# Patient Record
Sex: Male | Born: 1935 | Race: White | Hispanic: No | Marital: Married | Smoking: Former smoker
Health system: Southern US, Community
[De-identification: ages and names within clinical notes are randomized; demographics above are authoritative.]

## PROBLEM LIST (undated history)

## (undated) DIAGNOSIS — E079 Disorder of thyroid, unspecified: Secondary | ICD-10-CM

## (undated) DIAGNOSIS — G709 Myoneural disorder, unspecified: Secondary | ICD-10-CM

## (undated) DIAGNOSIS — I739 Peripheral vascular disease, unspecified: Secondary | ICD-10-CM

## (undated) DIAGNOSIS — R918 Other nonspecific abnormal finding of lung field: Secondary | ICD-10-CM

## (undated) DIAGNOSIS — R0602 Shortness of breath: Secondary | ICD-10-CM

## (undated) DIAGNOSIS — E785 Hyperlipidemia, unspecified: Secondary | ICD-10-CM

## (undated) DIAGNOSIS — IMO0002 Reserved for concepts with insufficient information to code with codable children: Secondary | ICD-10-CM

## (undated) DIAGNOSIS — I639 Cerebral infarction, unspecified: Secondary | ICD-10-CM

## (undated) DIAGNOSIS — N529 Male erectile dysfunction, unspecified: Secondary | ICD-10-CM

## (undated) DIAGNOSIS — E119 Type 2 diabetes mellitus without complications: Secondary | ICD-10-CM

## (undated) DIAGNOSIS — C189 Malignant neoplasm of colon, unspecified: Secondary | ICD-10-CM

## (undated) DIAGNOSIS — N400 Enlarged prostate without lower urinary tract symptoms: Secondary | ICD-10-CM

## (undated) DIAGNOSIS — F419 Anxiety disorder, unspecified: Secondary | ICD-10-CM

## (undated) DIAGNOSIS — N183 Chronic kidney disease, stage 3 unspecified: Secondary | ICD-10-CM

## (undated) DIAGNOSIS — K219 Gastro-esophageal reflux disease without esophagitis: Secondary | ICD-10-CM

## (undated) DIAGNOSIS — R011 Cardiac murmur, unspecified: Secondary | ICD-10-CM

## (undated) DIAGNOSIS — J189 Pneumonia, unspecified organism: Secondary | ICD-10-CM

## (undated) DIAGNOSIS — I10 Essential (primary) hypertension: Secondary | ICD-10-CM

## (undated) DIAGNOSIS — J449 Chronic obstructive pulmonary disease, unspecified: Secondary | ICD-10-CM

## (undated) DIAGNOSIS — I714 Abdominal aortic aneurysm, without rupture, unspecified: Secondary | ICD-10-CM

## (undated) DIAGNOSIS — C73 Malignant neoplasm of thyroid gland: Secondary | ICD-10-CM

## (undated) DIAGNOSIS — M8580 Other specified disorders of bone density and structure, unspecified site: Secondary | ICD-10-CM

## (undated) HISTORY — DX: Pneumonia, unspecified organism: J18.9

## (undated) HISTORY — DX: Reserved for concepts with insufficient information to code with codable children: IMO0002

## (undated) HISTORY — DX: Peripheral vascular disease, unspecified: I73.9

## (undated) HISTORY — DX: Chronic kidney disease, stage 3 unspecified: N18.30

## (undated) HISTORY — DX: Gastro-esophageal reflux disease without esophagitis: K21.9

## (undated) HISTORY — DX: Cerebral infarction, unspecified: I63.9

## (undated) HISTORY — DX: Abdominal aortic aneurysm, without rupture: I71.4

## (undated) HISTORY — DX: Malignant neoplasm of colon, unspecified: C18.9

## (undated) HISTORY — DX: Disorder of thyroid, unspecified: E07.9

## (undated) HISTORY — DX: Hyperlipidemia, unspecified: E78.5

## (undated) HISTORY — DX: Chronic kidney disease, stage 3 (moderate): N18.3

## (undated) HISTORY — DX: Essential (primary) hypertension: I10

## (undated) HISTORY — DX: Abdominal aortic aneurysm, without rupture, unspecified: I71.40

## (undated) HISTORY — DX: Chronic obstructive pulmonary disease, unspecified: J44.9

## (undated) HISTORY — PX: ABDOMINAL AORTIC ANEURYSM REPAIR: SUR1152

## (undated) HISTORY — DX: Other specified disorders of bone density and structure, unspecified site: M85.80

## (undated) HISTORY — PX: SEPTOPLASTY: SUR1290

## (undated) HISTORY — DX: Benign prostatic hyperplasia without lower urinary tract symptoms: N40.0

## (undated) HISTORY — DX: Male erectile dysfunction, unspecified: N52.9

## (undated) HISTORY — DX: Other nonspecific abnormal finding of lung field: R91.8

## (undated) HISTORY — PX: CATARACT EXTRACTION: SUR2

---

## 1999-02-23 ENCOUNTER — Encounter: Payer: Self-pay | Admitting: Emergency Medicine

## 1999-02-23 ENCOUNTER — Emergency Department (HOSPITAL_COMMUNITY): Admission: EM | Admit: 1999-02-23 | Discharge: 1999-02-23 | Payer: Self-pay | Admitting: Emergency Medicine

## 2002-02-11 ENCOUNTER — Ambulatory Visit (HOSPITAL_COMMUNITY): Admission: RE | Admit: 2002-02-11 | Discharge: 2002-02-11 | Payer: Self-pay | Admitting: Internal Medicine

## 2002-03-02 ENCOUNTER — Ambulatory Visit (HOSPITAL_COMMUNITY): Admission: RE | Admit: 2002-03-02 | Discharge: 2002-03-02 | Payer: Self-pay | Admitting: Internal Medicine

## 2004-12-24 HISTORY — PX: COLON SURGERY: SHX602

## 2005-10-03 ENCOUNTER — Encounter: Payer: Self-pay | Admitting: Cardiovascular Disease

## 2005-10-03 ENCOUNTER — Ambulatory Visit (HOSPITAL_COMMUNITY): Admission: RE | Admit: 2005-10-03 | Discharge: 2005-10-03 | Payer: Self-pay | Admitting: Internal Medicine

## 2005-11-06 ENCOUNTER — Encounter: Admission: RE | Admit: 2005-11-06 | Discharge: 2005-12-03 | Payer: Self-pay | Admitting: Internal Medicine

## 2005-12-04 ENCOUNTER — Encounter: Admission: RE | Admit: 2005-12-04 | Discharge: 2006-03-04 | Payer: Self-pay | Admitting: Internal Medicine

## 2005-12-24 HISTORY — PX: COLON RESECTION: SHX5231

## 2005-12-24 HISTORY — PX: THYROIDECTOMY: SHX17

## 2006-09-30 ENCOUNTER — Ambulatory Visit: Payer: Self-pay | Admitting: Internal Medicine

## 2006-10-21 ENCOUNTER — Encounter (INDEPENDENT_AMBULATORY_CARE_PROVIDER_SITE_OTHER): Payer: Self-pay | Admitting: Specialist

## 2006-10-21 ENCOUNTER — Encounter: Admission: RE | Admit: 2006-10-21 | Discharge: 2006-10-21 | Payer: Self-pay | Admitting: Internal Medicine

## 2006-10-21 ENCOUNTER — Other Ambulatory Visit: Admission: RE | Admit: 2006-10-21 | Discharge: 2006-10-21 | Payer: Self-pay | Admitting: Interventional Radiology

## 2006-10-31 ENCOUNTER — Encounter (INDEPENDENT_AMBULATORY_CARE_PROVIDER_SITE_OTHER): Payer: Self-pay | Admitting: *Deleted

## 2006-10-31 ENCOUNTER — Ambulatory Visit: Payer: Self-pay | Admitting: Internal Medicine

## 2006-11-15 ENCOUNTER — Ambulatory Visit (HOSPITAL_COMMUNITY): Admission: RE | Admit: 2006-11-15 | Discharge: 2006-11-15 | Payer: Self-pay | Admitting: Surgery

## 2006-11-18 ENCOUNTER — Encounter: Payer: Self-pay | Admitting: Cardiovascular Disease

## 2006-11-25 ENCOUNTER — Ambulatory Visit: Payer: Self-pay | Admitting: Pulmonary Disease

## 2006-12-19 ENCOUNTER — Encounter (INDEPENDENT_AMBULATORY_CARE_PROVIDER_SITE_OTHER): Payer: Self-pay | Admitting: *Deleted

## 2006-12-19 ENCOUNTER — Inpatient Hospital Stay (HOSPITAL_COMMUNITY): Admission: RE | Admit: 2006-12-19 | Discharge: 2006-12-25 | Payer: Self-pay | Admitting: Surgery

## 2006-12-25 ENCOUNTER — Ambulatory Visit: Payer: Self-pay | Admitting: Hematology and Oncology

## 2007-01-07 LAB — COMPREHENSIVE METABOLIC PANEL
ALT: 29 U/L (ref 0–53)
AST: 17 U/L (ref 0–37)
CO2: 25 mEq/L (ref 19–32)
Sodium: 143 mEq/L (ref 135–145)
Total Bilirubin: 0.6 mg/dL (ref 0.3–1.2)
Total Protein: 6.7 g/dL (ref 6.0–8.3)

## 2007-01-07 LAB — CBC WITH DIFFERENTIAL/PLATELET
BASO%: 0.5 % (ref 0.0–2.0)
LYMPH%: 28.9 % (ref 14.0–48.0)
MCHC: 34.7 g/dL (ref 32.0–35.9)
MONO#: 0.7 10*3/uL (ref 0.1–0.9)
Platelets: 269 10*3/uL (ref 145–400)
RBC: 4.41 10*6/uL (ref 4.20–5.71)
WBC: 5.7 10*3/uL (ref 4.0–10.0)
lymph#: 1.6 10*3/uL (ref 0.9–3.3)

## 2007-01-07 LAB — CEA: CEA: 1.4 ng/mL (ref 0.0–5.0)

## 2007-01-23 ENCOUNTER — Encounter (INDEPENDENT_AMBULATORY_CARE_PROVIDER_SITE_OTHER): Payer: Self-pay | Admitting: *Deleted

## 2007-01-23 ENCOUNTER — Inpatient Hospital Stay (HOSPITAL_COMMUNITY): Admission: RE | Admit: 2007-01-23 | Discharge: 2007-01-24 | Payer: Self-pay | Admitting: Surgery

## 2007-02-10 ENCOUNTER — Encounter (HOSPITAL_COMMUNITY): Admission: RE | Admit: 2007-02-10 | Discharge: 2007-02-14 | Payer: Self-pay | Admitting: Surgery

## 2007-03-03 ENCOUNTER — Ambulatory Visit (HOSPITAL_COMMUNITY): Admission: RE | Admit: 2007-03-03 | Discharge: 2007-03-03 | Payer: Self-pay | Admitting: Surgery

## 2007-03-14 ENCOUNTER — Encounter (HOSPITAL_COMMUNITY): Admission: RE | Admit: 2007-03-14 | Discharge: 2007-06-12 | Payer: Self-pay | Admitting: Surgery

## 2007-04-03 ENCOUNTER — Ambulatory Visit (HOSPITAL_COMMUNITY): Admission: RE | Admit: 2007-04-03 | Discharge: 2007-04-03 | Payer: Self-pay | Admitting: Hematology and Oncology

## 2007-04-04 ENCOUNTER — Ambulatory Visit: Payer: Self-pay | Admitting: Hematology and Oncology

## 2007-04-09 LAB — CBC WITH DIFFERENTIAL/PLATELET
BASO%: 0.6 % (ref 0.0–2.0)
EOS%: 4.8 % (ref 0.0–7.0)
HCT: 36.9 % — ABNORMAL LOW (ref 38.7–49.9)
LYMPH%: 24.9 % (ref 14.0–48.0)
MCH: 33 pg (ref 28.0–33.4)
MCHC: 35.3 g/dL (ref 32.0–35.9)
MCV: 93.2 fL (ref 81.6–98.0)
MONO%: 12.3 % (ref 0.0–13.0)
NEUT%: 57.4 % (ref 40.0–75.0)
Platelets: 186 10*3/uL (ref 145–400)
lymph#: 1.3 10*3/uL (ref 0.9–3.3)

## 2007-04-09 LAB — COMPREHENSIVE METABOLIC PANEL
ALT: 12 U/L (ref 0–53)
AST: 12 U/L (ref 0–37)
BUN: 16 mg/dL (ref 6–23)
Calcium: 8.9 mg/dL (ref 8.4–10.5)
Creatinine, Ser: 1.26 mg/dL (ref 0.40–1.50)
Total Bilirubin: 0.5 mg/dL (ref 0.3–1.2)

## 2007-04-09 LAB — CEA: CEA: 1.2 ng/mL (ref 0.0–5.0)

## 2007-08-27 ENCOUNTER — Ambulatory Visit: Payer: Self-pay | Admitting: Hematology and Oncology

## 2007-08-29 LAB — CBC WITH DIFFERENTIAL/PLATELET
BASO%: 0.5 % (ref 0.0–2.0)
Basophils Absolute: 0 10*3/uL (ref 0.0–0.1)
HCT: 37.2 % — ABNORMAL LOW (ref 38.7–49.9)
HGB: 13.4 g/dL (ref 13.0–17.1)
LYMPH%: 29.2 % (ref 14.0–48.0)
MCH: 33.1 pg (ref 28.0–33.4)
MCHC: 35.9 g/dL (ref 32.0–35.9)
MONO#: 0.4 10*3/uL (ref 0.1–0.9)
NEUT%: 53.3 % (ref 40.0–75.0)
Platelets: 175 10*3/uL (ref 145–400)
WBC: 4 10*3/uL (ref 4.0–10.0)
lymph#: 1.2 10*3/uL (ref 0.9–3.3)

## 2007-08-29 LAB — COMPREHENSIVE METABOLIC PANEL
BUN: 32 mg/dL — ABNORMAL HIGH (ref 6–23)
CO2: 22 mEq/L (ref 19–32)
Calcium: 8.7 mg/dL (ref 8.4–10.5)
Chloride: 107 mEq/L (ref 96–112)
Creatinine, Ser: 1.67 mg/dL — ABNORMAL HIGH (ref 0.40–1.50)
Total Bilirubin: 0.7 mg/dL (ref 0.3–1.2)

## 2007-08-29 LAB — CEA: CEA: 1.9 ng/mL (ref 0.0–5.0)

## 2007-08-29 LAB — LACTATE DEHYDROGENASE: LDH: 158 U/L (ref 94–250)

## 2007-09-01 ENCOUNTER — Ambulatory Visit (HOSPITAL_COMMUNITY): Admission: RE | Admit: 2007-09-01 | Discharge: 2007-09-01 | Payer: Self-pay | Admitting: Hematology and Oncology

## 2007-10-29 ENCOUNTER — Ambulatory Visit: Payer: Self-pay | Admitting: Internal Medicine

## 2007-11-05 ENCOUNTER — Ambulatory Visit: Payer: Self-pay | Admitting: Internal Medicine

## 2007-11-05 ENCOUNTER — Encounter: Payer: Self-pay | Admitting: Internal Medicine

## 2007-11-06 ENCOUNTER — Encounter (INDEPENDENT_AMBULATORY_CARE_PROVIDER_SITE_OTHER): Payer: Self-pay | Admitting: *Deleted

## 2008-01-02 ENCOUNTER — Ambulatory Visit: Payer: Self-pay | Admitting: Vascular Surgery

## 2008-02-16 ENCOUNTER — Ambulatory Visit: Payer: Self-pay | Admitting: Hematology and Oncology

## 2008-02-18 LAB — COMPREHENSIVE METABOLIC PANEL
Albumin: 4.3 g/dL (ref 3.5–5.2)
BUN: 26 mg/dL — ABNORMAL HIGH (ref 6–23)
CO2: 21 mEq/L (ref 19–32)
Calcium: 9.2 mg/dL (ref 8.4–10.5)
Chloride: 109 mEq/L (ref 96–112)
Glucose, Bld: 91 mg/dL (ref 70–99)
Potassium: 4.5 mEq/L (ref 3.5–5.3)

## 2008-02-18 LAB — CEA: CEA: 0.8 ng/mL (ref 0.0–5.0)

## 2008-02-18 LAB — CBC WITH DIFFERENTIAL/PLATELET
Basophils Absolute: 0 10*3/uL (ref 0.0–0.1)
Eosinophils Absolute: 0.2 10*3/uL (ref 0.0–0.5)
HGB: 13.5 g/dL (ref 13.0–17.1)
MCV: 94.2 fL (ref 81.6–98.0)
NEUT#: 3.2 10*3/uL (ref 1.5–6.5)
RDW: 13.4 % (ref 11.2–14.6)
lymph#: 1.2 10*3/uL (ref 0.9–3.3)

## 2008-02-20 ENCOUNTER — Ambulatory Visit (HOSPITAL_COMMUNITY): Admission: RE | Admit: 2008-02-20 | Discharge: 2008-02-20 | Payer: Self-pay | Admitting: Hematology and Oncology

## 2008-05-26 ENCOUNTER — Ambulatory Visit: Payer: Self-pay | Admitting: Hematology and Oncology

## 2008-05-28 LAB — CBC WITH DIFFERENTIAL/PLATELET
Basophils Absolute: 0 10*3/uL (ref 0.0–0.1)
EOS%: 3.7 % (ref 0.0–7.0)
HCT: 39.9 % (ref 38.7–49.9)
HGB: 13.6 g/dL (ref 13.0–17.1)
LYMPH%: 24.6 % (ref 14.0–48.0)
MCH: 32.5 pg (ref 28.0–33.4)
MCV: 94.8 fL (ref 81.6–98.0)
NEUT%: 59.6 % (ref 40.0–75.0)
Platelets: 176 10*3/uL (ref 145–400)
lymph#: 1.1 10*3/uL (ref 0.9–3.3)

## 2008-05-28 LAB — CEA: CEA: 1.7 ng/mL (ref 0.0–5.0)

## 2008-05-28 LAB — COMPREHENSIVE METABOLIC PANEL
AST: 16 U/L (ref 0–37)
BUN: 27 mg/dL — ABNORMAL HIGH (ref 6–23)
Calcium: 9.5 mg/dL (ref 8.4–10.5)
Chloride: 108 mEq/L (ref 96–112)
Creatinine, Ser: 1.65 mg/dL — ABNORMAL HIGH (ref 0.40–1.50)
Total Bilirubin: 0.7 mg/dL (ref 0.3–1.2)

## 2008-06-01 ENCOUNTER — Encounter: Payer: Self-pay | Admitting: Internal Medicine

## 2008-10-05 ENCOUNTER — Encounter: Payer: Self-pay | Admitting: Cardiovascular Disease

## 2008-11-24 ENCOUNTER — Ambulatory Visit: Payer: Self-pay | Admitting: Hematology and Oncology

## 2008-11-26 LAB — CBC WITH DIFFERENTIAL/PLATELET
BASO%: 0.7 % (ref 0.0–2.0)
EOS%: 3.5 % (ref 0.0–7.0)
LYMPH%: 25.6 % (ref 14.0–48.0)
MCHC: 34.8 g/dL (ref 32.0–35.9)
MCV: 94.7 fL (ref 81.6–98.0)
MONO%: 12.6 % (ref 0.0–13.0)
NEUT#: 2.4 10*3/uL (ref 1.5–6.5)
Platelets: 165 10*3/uL (ref 145–400)
RBC: 3.94 10*6/uL — ABNORMAL LOW (ref 4.20–5.71)
RDW: 13.1 % (ref 11.2–14.6)

## 2008-11-26 LAB — COMPREHENSIVE METABOLIC PANEL
ALT: 26 U/L (ref 0–53)
AST: 19 U/L (ref 0–37)
Albumin: 4 g/dL (ref 3.5–5.2)
Alkaline Phosphatase: 77 U/L (ref 39–117)
Potassium: 4.4 mEq/L (ref 3.5–5.3)
Sodium: 140 mEq/L (ref 135–145)
Total Bilirubin: 0.4 mg/dL (ref 0.3–1.2)
Total Protein: 6.3 g/dL (ref 6.0–8.3)

## 2008-11-26 LAB — CEA: CEA: 1.7 ng/mL (ref 0.0–5.0)

## 2008-11-30 ENCOUNTER — Ambulatory Visit (HOSPITAL_COMMUNITY): Admission: RE | Admit: 2008-11-30 | Discharge: 2008-11-30 | Payer: Self-pay | Admitting: Hematology and Oncology

## 2008-11-30 ENCOUNTER — Encounter (INDEPENDENT_AMBULATORY_CARE_PROVIDER_SITE_OTHER): Payer: Self-pay | Admitting: *Deleted

## 2008-12-03 ENCOUNTER — Encounter: Payer: Self-pay | Admitting: Internal Medicine

## 2009-01-04 DIAGNOSIS — C189 Malignant neoplasm of colon, unspecified: Secondary | ICD-10-CM | POA: Insufficient documentation

## 2009-01-04 DIAGNOSIS — J438 Other emphysema: Secondary | ICD-10-CM | POA: Insufficient documentation

## 2009-01-04 DIAGNOSIS — Z85038 Personal history of other malignant neoplasm of large intestine: Secondary | ICD-10-CM | POA: Insufficient documentation

## 2009-01-04 DIAGNOSIS — Z85850 Personal history of malignant neoplasm of thyroid: Secondary | ICD-10-CM

## 2009-01-04 DIAGNOSIS — I6350 Cerebral infarction due to unspecified occlusion or stenosis of unspecified cerebral artery: Secondary | ICD-10-CM

## 2009-01-04 DIAGNOSIS — J449 Chronic obstructive pulmonary disease, unspecified: Secondary | ICD-10-CM | POA: Insufficient documentation

## 2009-01-05 ENCOUNTER — Ambulatory Visit: Payer: Self-pay | Admitting: Internal Medicine

## 2009-02-02 ENCOUNTER — Ambulatory Visit: Payer: Self-pay | Admitting: Internal Medicine

## 2009-05-25 ENCOUNTER — Ambulatory Visit: Payer: Self-pay | Admitting: Hematology and Oncology

## 2009-05-27 LAB — CBC WITH DIFFERENTIAL/PLATELET
Basophils Absolute: 0 10*3/uL (ref 0.0–0.1)
Eosinophils Absolute: 0.1 10*3/uL (ref 0.0–0.5)
HGB: 13.8 g/dL (ref 13.0–17.1)
MONO%: 15.3 % — ABNORMAL HIGH (ref 0.0–14.0)
NEUT#: 2.5 10*3/uL (ref 1.5–6.5)
RBC: 4.18 10*6/uL — ABNORMAL LOW (ref 4.20–5.82)
RDW: 13.3 % (ref 11.0–14.6)
WBC: 4.6 10*3/uL (ref 4.0–10.3)
lymph#: 1.2 10*3/uL (ref 0.9–3.3)

## 2009-05-27 LAB — COMPREHENSIVE METABOLIC PANEL
AST: 19 U/L (ref 0–37)
Albumin: 4 g/dL (ref 3.5–5.2)
Alkaline Phosphatase: 74 U/L (ref 39–117)
BUN: 35 mg/dL — ABNORMAL HIGH (ref 6–23)
Calcium: 9 mg/dL (ref 8.4–10.5)
Chloride: 108 mEq/L (ref 96–112)
Glucose, Bld: 109 mg/dL — ABNORMAL HIGH (ref 70–99)
Potassium: 4.2 mEq/L (ref 3.5–5.3)
Sodium: 142 mEq/L (ref 135–145)
Total Protein: 6.9 g/dL (ref 6.0–8.3)

## 2009-06-03 ENCOUNTER — Encounter: Payer: Self-pay | Admitting: Internal Medicine

## 2009-12-26 ENCOUNTER — Ambulatory Visit: Payer: Self-pay | Admitting: Hematology and Oncology

## 2009-12-29 LAB — CBC WITH DIFFERENTIAL/PLATELET
BASO%: 0.6 % (ref 0.0–2.0)
EOS%: 3.7 % (ref 0.0–7.0)
HCT: 42.7 % (ref 38.4–49.9)
LYMPH%: 28.7 % (ref 14.0–49.0)
MCH: 33.1 pg (ref 27.2–33.4)
MCHC: 34 g/dL (ref 32.0–36.0)
MCV: 97.3 fL (ref 79.3–98.0)
MONO%: 16 % — ABNORMAL HIGH (ref 0.0–14.0)
NEUT%: 51 % (ref 39.0–75.0)
Platelets: 161 10*3/uL (ref 140–400)
RBC: 4.39 10*6/uL (ref 4.20–5.82)

## 2009-12-29 LAB — COMPREHENSIVE METABOLIC PANEL
ALT: 32 U/L (ref 0–53)
AST: 23 U/L (ref 0–37)
Alkaline Phosphatase: 75 U/L (ref 39–117)
CO2: 22 mEq/L (ref 19–32)
Creatinine, Ser: 1.94 mg/dL — ABNORMAL HIGH (ref 0.40–1.50)
Total Bilirubin: 0.5 mg/dL (ref 0.3–1.2)

## 2009-12-29 LAB — CEA: CEA: 1.6 ng/mL (ref 0.0–5.0)

## 2010-01-02 ENCOUNTER — Ambulatory Visit (HOSPITAL_COMMUNITY): Admission: RE | Admit: 2010-01-02 | Discharge: 2010-01-02 | Payer: Self-pay | Admitting: Hematology and Oncology

## 2010-01-05 ENCOUNTER — Encounter: Payer: Self-pay | Admitting: Internal Medicine

## 2010-01-27 ENCOUNTER — Ambulatory Visit: Payer: Self-pay | Admitting: Vascular Surgery

## 2010-03-07 DIAGNOSIS — C73 Malignant neoplasm of thyroid gland: Secondary | ICD-10-CM | POA: Insufficient documentation

## 2010-03-07 DIAGNOSIS — N529 Male erectile dysfunction, unspecified: Secondary | ICD-10-CM | POA: Insufficient documentation

## 2010-03-07 DIAGNOSIS — E559 Vitamin D deficiency, unspecified: Secondary | ICD-10-CM | POA: Insufficient documentation

## 2010-03-07 DIAGNOSIS — E1169 Type 2 diabetes mellitus with other specified complication: Secondary | ICD-10-CM | POA: Insufficient documentation

## 2010-03-07 DIAGNOSIS — K219 Gastro-esophageal reflux disease without esophagitis: Secondary | ICD-10-CM | POA: Insufficient documentation

## 2010-03-07 DIAGNOSIS — E785 Hyperlipidemia, unspecified: Secondary | ICD-10-CM | POA: Insufficient documentation

## 2010-06-30 ENCOUNTER — Ambulatory Visit: Payer: Self-pay | Admitting: Hematology and Oncology

## 2010-07-04 LAB — CBC WITH DIFFERENTIAL/PLATELET
BASO%: 0.7 % (ref 0.0–2.0)
Basophils Absolute: 0 10*3/uL (ref 0.0–0.1)
Eosinophils Absolute: 0.1 10*3/uL (ref 0.0–0.5)
HCT: 40.8 % (ref 38.4–49.9)
HGB: 14.4 g/dL (ref 13.0–17.1)
LYMPH%: 24.6 % (ref 14.0–49.0)
MCHC: 35.2 g/dL (ref 32.0–36.0)
MONO#: 0.7 10*3/uL (ref 0.1–0.9)
NEUT%: 57.9 % (ref 39.0–75.0)
Platelets: 176 10*3/uL (ref 140–400)
WBC: 4.6 10*3/uL (ref 4.0–10.3)
lymph#: 1.1 10*3/uL (ref 0.9–3.3)

## 2010-07-05 LAB — COMPREHENSIVE METABOLIC PANEL
BUN: 25 mg/dL — ABNORMAL HIGH (ref 6–23)
CO2: 18 mEq/L — ABNORMAL LOW (ref 19–32)
Calcium: 8.9 mg/dL (ref 8.4–10.5)
Chloride: 108 mEq/L (ref 96–112)
Creatinine, Ser: 1.81 mg/dL — ABNORMAL HIGH (ref 0.40–1.50)
Glucose, Bld: 183 mg/dL — ABNORMAL HIGH (ref 70–99)
Total Bilirubin: 0.4 mg/dL (ref 0.3–1.2)

## 2010-07-05 LAB — LACTATE DEHYDROGENASE: LDH: 174 U/L (ref 94–250)

## 2010-07-05 LAB — CEA: CEA: 1.3 ng/mL (ref 0.0–5.0)

## 2010-07-06 ENCOUNTER — Encounter: Payer: Self-pay | Admitting: Internal Medicine

## 2010-08-21 ENCOUNTER — Ambulatory Visit: Payer: Self-pay | Admitting: Vascular Surgery

## 2011-01-14 ENCOUNTER — Encounter: Payer: Self-pay | Admitting: Hematology and Oncology

## 2011-01-15 ENCOUNTER — Ambulatory Visit: Payer: Self-pay | Admitting: Hematology and Oncology

## 2011-01-16 ENCOUNTER — Ambulatory Visit (HOSPITAL_COMMUNITY)
Admission: RE | Admit: 2011-01-16 | Discharge: 2011-01-16 | Payer: Self-pay | Source: Home / Self Care | Attending: Hematology and Oncology | Admitting: Hematology and Oncology

## 2011-01-17 LAB — CBC WITH DIFFERENTIAL/PLATELET
BASO%: 0.5 % (ref 0.0–2.0)
MCHC: 34.7 g/dL (ref 32.0–36.0)
MONO#: 0.7 10*3/uL (ref 0.1–0.9)
RBC: 4.26 10*6/uL (ref 4.20–5.82)
WBC: 4.2 10*3/uL (ref 4.0–10.3)
lymph#: 0.9 10*3/uL (ref 0.9–3.3)

## 2011-01-17 LAB — CEA: CEA: 1.6 ng/mL (ref 0.0–5.0)

## 2011-01-17 LAB — COMPREHENSIVE METABOLIC PANEL
ALT: 35 U/L (ref 0–53)
CO2: 20 mEq/L (ref 19–32)
Calcium: 8.8 mg/dL (ref 8.4–10.5)
Chloride: 105 mEq/L (ref 96–112)
Sodium: 139 mEq/L (ref 135–145)
Total Bilirubin: 0.7 mg/dL (ref 0.3–1.2)
Total Protein: 6.4 g/dL (ref 6.0–8.3)

## 2011-01-23 NOTE — Letter (Signed)
[facility]   Imported By: Lennie Odor 07/17/2010 14:59:12  _____________________________________________________________________  External Attachment:    Type:   Image     Comment:   External Document

## 2011-01-23 NOTE — Letter (Signed)
[facility]   Imported By: Sherian Rein 01/26/2010 07:52:30  _____________________________________________________________________  External Attachment:    Type:   Image     Comment:   External Document

## 2011-01-24 ENCOUNTER — Encounter: Payer: Self-pay | Admitting: Internal Medicine

## 2011-01-24 ENCOUNTER — Ambulatory Visit (HOSPITAL_BASED_OUTPATIENT_CLINIC_OR_DEPARTMENT_OTHER): Payer: MEDICARE | Admitting: Hematology and Oncology

## 2011-01-24 DIAGNOSIS — C187 Malignant neoplasm of sigmoid colon: Secondary | ICD-10-CM

## 2011-01-24 DIAGNOSIS — C73 Malignant neoplasm of thyroid gland: Secondary | ICD-10-CM

## 2011-02-06 ENCOUNTER — Ambulatory Visit: Payer: Self-pay | Admitting: Vascular Surgery

## 2011-02-06 ENCOUNTER — Encounter: Payer: Self-pay | Admitting: Vascular Surgery

## 2011-02-06 ENCOUNTER — Ambulatory Visit (INDEPENDENT_AMBULATORY_CARE_PROVIDER_SITE_OTHER): Payer: MEDICARE | Admitting: Vascular Surgery

## 2011-02-06 DIAGNOSIS — I714 Abdominal aortic aneurysm, without rupture: Secondary | ICD-10-CM

## 2011-02-07 NOTE — Assessment & Plan Note (Signed)
OFFICE VISIT  David [name], David [name] DOB:  03/08/1931                                       02/06/2011 CHART#:[id number]  Patient presents today for continued follow-up of his infrarenal abdominal aortic aneurysm.  He had a CT scan on 01/24, and I am discussing this with him.  This was a follow-up of his colon cancer.  He reports that he has been discharged from cancer followup, now being 5 years free of any evidence of disease after his colon resection.  He does have no change in his CT aneurysm size with a maximal diameter of 5.0 cm.  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  This is no change as well.  Otherwise he is doing quite well with no new medical difficulties.  PHYSICAL EXAMINATION:  Abdomen is soft and nontender.  He does have prominent aortic pulsation with no tenderness over this.  His femoral pulses are 2+.  Blood pressure is 122/69, pulse 75, respirations 20.  I have reviewed his films with patient and his wife.  I have recommended that we continue to see him at 54-month intervals with ultrasound since his cancer CT followups have been discontinued.  We will see him again in 6 months for vascular lab follow-up of his aorta.  I would repeat a CT scan of his chest in several years to follow up on the ectasia of his thoracic aorta as well.    Larina Earthly, M.D.  TFE/MEDQ  D:  02/06/2011  T:  02/07/2011  Job:  [id number]  cc:   Loraine Leriche A. Perini, M.D.

## 2011-02-13 ENCOUNTER — Ambulatory Visit: Payer: Self-pay | Admitting: Vascular Surgery

## 2011-02-20 NOTE — Letter (Signed)
Summary: [facility]  [facility]   Imported By: Sherian Rein 02/12/2011 11:28:12  _____________________________________________________________________  External Attachment:    Type:   Image     Comment:   External Document

## 2011-05-08 NOTE — Procedures (Signed)
DUPLEX ULTRASOUND OF ABDOMINAL AORTA   INDICATION:  Abdominal aortic aneurysm   HISTORY:  Diabetes:  Yes  Cardiac:  No  Hypertension:  Yes  Smoking:  Previous  Connective Tissue Disorder:  Family History:  No  Previous Surgery:  No   DUPLEX EXAM:         AP (cm)                   TRANSVERSE (cm)  Proximal             3.2 cm                    3.2 cm  Mid                  2.8 cm                    2.95 cm  Distal               4.9 cm                    4.7 cm  Right Iliac          1.4 cm                    1.3 cm  Left Iliac           1.2 cm                    1.4 cm   PREVIOUS:  Date: 01/02/2010 (CT)  AP:  4.9  TRANSVERSE:  4.9   IMPRESSION:  Aneurysmal dilatation of the distal abdominal aorta with no  significant change of maximum diameter when compared to the previous  study.   ___________________________________________  Larina Earthly, M.D.   CH/MEDQ  D:  08/22/2010  T:  08/22/2010  Job:  [id number]

## 2011-05-08 NOTE — Assessment & Plan Note (Signed)
OFFICE VISIT   David [name], David [name]  DOB:  1931/02/24                                       01/02/2008  CHART#:[id number]   The patient presents today for evaluation of abdominal aortic aneurysm.  He is here with his wife.  He is a pleasant 77 year old gentleman who  underwent colon cancer resection in December of 2007.  At that time, he  had CAT scan for evaluation and had incidental finding of abdominal  aortic aneurysm, and I am seeing him for further discussion of this.  He  has had several different CT scans now with evidence of slow growth of  his aneurysm.  He has, apparently, no evidence of colon cancer.  He is  also status post resection of thyroid cancer in January of 2008, again  with no evidence of disease currently.   PAST HISTORY:  Significant for hypertension, elevated cholesterol, COPD,  and emphysema.   SOCIAL HISTORY:  He is married with 3 children.  He is retired.  He does  not smoke having quit in November 2007.  He does not drink alcohol on a  regular basis.   REVIEW OF SYSTEMS:  Positive for shortness of breath with exertion,  occasional wheezing.  Does have a history of blood in the stools prior  to his colon surgery.  Does have urinary urgency.  He does have burning  sensation in both feet with walking.  Otherwise, review of systems is  negative.   ALLERGIES:  None.   MEDICATIONS:  Aggrenox 25-200 b.i.d.  Hyzaar 100-25 one daily.  Advair  inhaler 250/50 one puff q. a.m. and q. p.m.  Combivent 200 two puffs q.  a.m. and q. p.m.  Synthroid 150 mcg Tuesday through Sunday.  Synthroid  75 mcg q. Monday.  Vitamin D 50,000 units q. Monday.  Niaspan ER 500 mg  q. p.m.  Crestor 20 mg q. p.m.   PHYSICAL EXAM:  Well-developed, well-nourished white male appearing  stated age of 37.  Blood pressure is 138/76, pulse 70, respirations 18,  saturations 97% on room air.  His carotid arteries have bruits  bilaterally.  He is grossly intact  neurologically.  Radial pulses are 2+  bilaterally.  He has 2+ femoral, 2+ popliteal, and 2+ posterior tibial  pulses with no evidence of peripheral aneurysm.  Abdominal exam reveals  a well-healed lower midline incision and no evidence of masses.  I  reviewed his CAT scan with him.  This does show a maximal diameter of  4.6 cm in September of 2008.  This compared with a maximal diameter of  4.3 cm in April of 2008.  I recommended serial followup and explained  that we would recommend surgery if this reaches the 5-5.5 cm range.  I  explained symptoms of leaking aneurysm to the patient and his wife and  they know to present immediately to the [facility] emergency room should  this occur.  He is to have a CAT scan for followup of his cancer in  April.  I have asked that he have the results of this forwarded to me.  I will see him again in 1 year in September with ultrasound followup of  his ultrasound.  He understands that if he is to have serial CAT scans,  then we would dispense with the ultrasounds and follow him by  his CAT  scan.   Larina Earthly, M.D.  Electronically Signed   TFE/MEDQ  D:  01/02/2008  T:  01/05/2008  Job:  [id number]

## 2011-05-08 NOTE — Assessment & Plan Note (Signed)
OFFICE VISIT   David [name], David [name]  DOB:  12/25/1930                                       01/27/2010  CHART#:[id number]   This patient presents today for continued follow-up of his asymptomatic  infrarenal abdominal aortic aneurysm.  I had seen him most recently in  January 2009.  At that time, he was known to have an infrarenal  abdominal aortic aneurysm found on CT scan when he was having an  evaluation for colon cancer.  He has no symptoms referable to his  aneurysm.   He continues to be stable with diet-controlled diabetes.  He has  controlled hypertension and elevated cholesterol with medications.  He  does have history of COPD and emphysema.   FAMILY HISTORY:  Negative for premature atherosclerotic disease.   SOCIAL HISTORY:  He is married with two children.  He is retired.  He  quit smoking in 2007.  Does not drink alcohol on a regular basis.   REVIEW OF SYSTEMS:  Positive only for pain in his legs with a walking  and a prior history of stroke in 2006.  He has no weight loss or weight  gain.  Review of systems otherwise negative.   PHYSICAL EXAMINATION:  A well-developed, well-nourished white male  appearing stated age of 62.  Blood pressure 108/68, pulse 73,  respirations 18, temperature is 98.6.  General:  He is well-developed in  no acute distress.  HEENT:  Normal.  Lungs:  Clear bilaterally.  Cardiovascular:  Heart has regular rate and rhythm.  He does have 2+  radial, 2+ femoral and 2+ posterior tibial pulses bilaterally.  Abdomen:  Exam reveals no masses, tenderness.  He does have a prominent aortic  pulsation.  Musculoskeletal:  No major deformities or cyanosis.  Neurologic:  No focal weakness or paresthesias.  Skin:  Without ulcers  or rashes.   I do have a recent CT scan and I have reviewed this and discussed at  length with the patient and his wife.  This does show a maximal diameter  of 4.9 cm with a slight increase from his prior  studies.  I again  reviewed symptoms of leaking aneurysm with him and he will notify us  immediately should this occur.  Otherwise we will continue to follow  this on a 65-month interval.  He is having frequent CAT scans and if so,  does not need ultrasound.  If he is going to go a while without a CT, he  will have ultrasound instead.  He is tentatively scheduled for  ultrasound in our office in 6 months.     Larina Earthly, M.D.  Electronically Signed   TFE/MEDQ  D:  01/27/2010  T:  01/30/2010  Job:  [id number]   cc:   Loraine Leriche A. Perini, M.D.

## 2011-05-11 NOTE — Assessment & Plan Note (Signed)
[facility]                             PULMONARY OFFICE NOTE   David [name], David [name]                   MRN:          [id number]  DATE:11/25/2006                            DOB:          1931-08-06    I met David [name] today for evaluation of his breathing difficulties  prior to undergoing surgery for colectomy for colon cancer and  thyroidectomy for thyroid cancer. He apparently had all these diagnosed  recently over the last few months. Specifically with regards to his  breathing, he carries a diagnosis of COPD with emphysema. He is  currently on an inhaler regimen of Advair 250/50 one puff b.i.d. as well  as Combivent 2 puffs b.i.d. He says that he does not have much as far  symptoms of coughing, wheezing, sputum production and he specifically  denies any hemoptysis. He says that he will occasionally get coughing at  night and occasionally get wheezing while he is asleep but otherwise he  does not feel that his breathing causes him any limitations with regards  to his activities. He does have a significant history of tobacco use and  he quit smoking approximately 1 week ago and he is currently using  Chantix to help with his smoking cessation. He says that he has noticed  that since he stopped smoking the occasional cough and occasional  wheezing has gotten better. He has had 2 episodes of pneumonia  previously, his last one being in the fall of 2006. He says that on each  occasion he had received a course of antibiotics at home but did not  require hospital admission and he has never required the use of systemic  steroids to help with his breathing. He says that he had a cardiac  evaluation done recently by Dr. Elease Hashimoto, and had undergone a nuclear  stress test which Mr. Carstens reports as being normal. He had undergone  a CT scan of his chest with intravenous contrast at [facility] on September 18, 2006 and this showed an abdominal  aortic  aneurysm measuring 4 cm in diameter, calcified granulomas in the  mediastinal nodes, subpleural nodule in the left lung base, emphysema  and a 2.7 cm right thyroid nodule. He had undergone a PET scan on  November 15, 2006 which showed a focal area of hypermetabolic activity  within the distant sigmoid colon. He said that he does have some  difficulty with his sleep as well. His current sleep pattern is that he  will try to go to bed around 11 but it can take him anywhere from 1 hour  to 2 hours to fall asleep. He says that once he does fall asleep he is  able to sleep through the night and wakes up between 7 and 8 o'clock in  the morning. His wife says that he does snore at night and has seen him  stop breathing while he is asleep at night. He tends to have worse  problems with his breathing and snoring while he is on his back and he  also tends to breath through his mouth. He will take  a nap at least 3-4  days a week and he falls asleep fairly easily when he is doing things  like watching TV.   PAST MEDICAL HISTORY:  Otherwise significant for hypertension,  COPD/emphysema, abdominal aortic aneurysm, elevated cholesterol, TIA x1,  thyroid cancer, colon cancer. He has had septoplasty.   CURRENT MEDICATIONS:  1.A  Combivent 2 puffs b.i.d. 2.A  Advair 250/50  one puff b.i.d. 3.A  Aggrenox 200/25 b.i.d. 4.A  Omeprazole 20 mg as  needed.5.A  Cozaar 100 mg daily.6.A  Niaspan ER 500 mg q.h.s. 7.A  Crestor 20 mg q.h.s. 8.A  Vitamin D once a week.9.A  Chantix 1 mg b.i.d.  He has no known drug allergies.   FAMILY HISTORY:  Significant for his father who had a history of  tuberculosis, mother had breast, uterine and stomach cancer. He has 3  brothers and 1 sister who had heart disease.   SOCIAL HISTORY:  He is married and lives with his wife. He used to work  in [employer] but is currently retired. He quit smoking 1 year ago. He  started smoking at the age of 67 and used to smoke 2  packs of cigarettes  per day. He denies significant alcohol use.   REVIEW OF SYSTEMS:  He complains of occasional indigestion.   PHYSICAL EXAMINATION:  VITAL SIGNS:A  He is 6 feet tall, 180 pounds.  Temperature is 97.4, blood pressure is 122/72, heart rate is 65, oxygen  saturation 96% on room air.HEENT:A  Pupils reactive, extraocular muscles  intact. There is no sinus tenderness, no nasal discharge. He has a  Mallampati 3 air-way with a decreased AP diameter of the oropharynx.  There is no lymphadenopathy, no thyromegaly. HEART:A  S1, S2, regular  rate and rhythm. CHEST:A  He has decreased breath sounds with a  prolonged expiratory phase but no wheezing or rales.ABDOMEN:A  Thin,  soft, nontender, positive bowel sounds. EXTREMITIES:A  There was no  edema, cyanosis or clubbing.NEUROLOGIC:A  There was no focal deficits  appreciated.   IMPRESSION:  1.A  Chronic obstructive pulmonary disease. I would  continue him on his inhaler regimen of Combivent and Advair. I do not  feel that he would need to undergo further pulmonary function testing at  the present time. 2.A  Tobacco abuse. He is to continue on his regimen  of Chantix to assist with his smoking cessation.3.A  Possible  obstructive sleep apnea. I discussed with him that it would be best to  have him undergo an overnight polysomnogram to further evaluate this. He  was quite reluctant to do this at the present time because he feels that  this would be an additional delay in getting his surgery done.A  Therefore I have made arrangements for him to have a home ApneaLink test  as a screening procedure to see if in fact he does have significant  sleep disordered breathing. If he does then I have recommended that he  undergo an overnight polysomnogram.4.A  Preoperative evaluation for his  thyroid cancer and colon cancer. I have discussed with him that he would  be at increased risk, but certainly not prohibitive risks, for perioperative  pulmonary complications with regards to undergoing these  surgeries. I also emphasized to him that ideally to achieve optimal  benefit from smoking cessation, he would need to have 4-6 weeks of  smoking cessation prior to undergoing surgery, but this would not  preclude him from undergoing surgery earlier.A  Then with regards to the  possibility  of obstructive sleep apnea, I discussed with him that I  again would like to review the results of his ApneaLink screening test.  If this is unrevealing then I think we could forego having him undergo  an overnight polysomnogram at least for the time being although this  certainly should be a consideration at some point particularly given the  fact that he has had two TIAs as well as hypertension. However at the  time of surgery, the anesthesiologist needs to be made  aware that there is a possible concern for obstructive sleep apnea so  that appropriate precautions can be taken during the perioperative  period.     Coralyn Helling, MD  Electronically Signed    VS/MedQ  DD: 11/25/2006  DT: 11/26/2006  Job #: [id number]   cc:   Loraine Leriche A. Perini, M.D.  Currie Paris, M.D.

## 2011-05-11 NOTE — Discharge Summary (Signed)
NAMEXZAVIEN, HARADA            ACCOUNT NO.:  [id number]   MEDICAL RECORD NO.:  [id number]          PATIENT TYPE:  INP   LOCATION:  1618                         FACILITY:  [facility]   PHYSICIAN:  Currie Paris, M.D.DATE OF BIRTH:  [birth year]   DATE OF ADMISSION:  12/19/2006  DATE OF DISCHARGE:                               DISCHARGE SUMMARY   FINAL DIAGNOSES:  1. Carcinoma, sigmoid colon (T3N0M0).  2. Papillary carcinoma of thyroid.  3. Chronic obstructive pulmonary disease.  4. Hypertension.  5. History of anticoagulation therapy.   MEDICAL HISTORY:  Mr. Crowson is a 77 year old gentleman who was having  a CT scan of the chest and was found to have a nodule in his thyroid and  a biopsy showed a papillary carcinoma.  In addition, he had a screening  colonoscopy and a sigmoid colon lesion was found, that on biopsy, showed  high-grade dysplasia, but clinically appeared to be malignant.  After a  lengthy workup including CT scan, pulmonary function tests and  evaluations, etc., it was elected to proceed at this point to a sigmoid  colectomy which would subsequently be followed in four to six weeks with  a thyroidectomy.   HOSPITAL COURSE:  The patient was admitted on 12/27 and underwent  surgery.  A sigmoid colectomy was done.  There was no evidence of  metastatic disease.  The lesion appeared to be malignant grossly.   Postoperatively he had a very benign course.  We kept him in the  Intensive Care Unit for close monitoring of his O2 sats because of COPD  and a questionable history of sleep apnea.  By the fourth postop day, he  was passing a fair amount of gas and we were able to start him on  liquids and advance his diet to solids.  By 11/02 he was ambulating,  having no nausea, tolerating a solid diet and having bowel movements.  He was taking an occasional Tylenol for pain.  He overall felt well.   On exam he was alert and he had been afebrile for several days.  His  abdomen was healing nicely.  He was felt able to be discharged.  At the  time of discharge, I did discuss the pathology report with him which  showed a sigmoid colon carcinoma 2.5 cm in size (T3), but his 12 lymph  nodes were negative and it was planned that he would have a follow-up  oncology consultation.   The patient is discharged to resume his usual home medications.  He will  take occasional Tylenol as needed for pain.  He will follow-up in my  office next week for the remaining suture removal.      Currie Paris, M.D.  Electronically Signed     CJS/MEDQ  D:  12/25/2006  T:  12/25/2006  Job:  [id number]   cc:   Loraine Leriche A. Perini, M.D.  Fax: [phone]   Coralyn Helling, MD  [address]  [place], [place] [address]   Wilhemina Bonito. Marina Goodell, MD  [address]  [place]  [place] [address]

## 2011-05-11 NOTE — Op Note (Signed)
David [name], David [name]            ACCOUNT NO.:  [id number]   MEDICAL RECORD NO.:  [id number]          PATIENT TYPE:  INP   LOCATION:  0001                         FACILITY:  [facility]   PHYSICIAN:  Currie Paris, M.D.DATE OF BIRTH:  August 09, 1931   DATE OF PROCEDURE:  01/23/2007  DATE OF DISCHARGE:                               OPERATIVE REPORT   PREOPERATIVE DIAGNOSIS:  Papillary carcinoma of thyroid right lobe.   POSTOPERATIVE DIAGNOSIS:  Papillary carcinoma of thyroid right lobe.   OPERATION:  Total thyroidectomy.   SURGEON:  Dr. Jamey Ripa.   ASSISTANT:  Dr. Luisa Hart.   ANESTHESIA:  General endotracheal.   CLINICAL HISTORY:  David [name] is a 77 year old gentleman who was  recently found, somewhat incidentally, to have a papillary carcinoma of  right lobe of the thyroid.  He was also found at essentially the same  time to have a carcinoma of the colon.  He has since undergone sigmoid  colectomy and is now admitted for his thyroidectomy.   DESCRIPTION OF PROCEDURE:  The patient was seen in the holding area and  he had no further questions.  We confirmed the thyroidectomy was the  planned procedure.   The patient was taken to the operating room.  After satisfactory general  endotracheal anesthesia had been obtained, the neck was prepped and  draped.  The time-out occurred.   I made a transverse cervical incision and deepened it through the  platysma.  Subplatysmal flaps were raised in the usual fashion.  Self-  retaining retractor was placed.   The midline fascia was opened and the thyroid identified.  Strap muscles  were dissected off of the right lobe of the thyroid and initially we had  this mobilized over and I could clearly see the superior pole so I got  around the superior pole with right-angle, staying very close to the  vessels and ligated those in continuity with 2-0 silks plus clips.  With  that divided, I found another small vessel coming in that was clipped  and divided, leaving two clips on the stay side.   As I rotated the thyroid medially I was trying to stay right on the  thyroid to avoid any injury to the nerve, but in doing so I noticed a  fairly large nodule somewhat more posterolaterally and rotating this up  it was not clear whether this was the actual tumor or a very large  perhaps 3-cm lymph node, but it looked more like thyroid gland.  With  that rotated up, I was then able to get a more usual approach to the  lateral attachments and staying up on the thyroid divided those  primarily with Harmonic scalpel but sometimes with clips and scissors  and cautery.  I identified what appeared to be the recurrent laryngeal  nerve and we checked this also at the end of the case to make sure that  it was intact.  There were some vessels that were clipped in the area,  but was felt that the nerve was find.  I also saw what appeared to be at  least one parathyroid which was  dissected off and kept off of the  specimen and was intact.  Once I got this up and saw the area where the  nerve entered the larynx, I was then able to use a cautery and peel the  thyroid off of the trachea until I got across the midline.   Attention was then turned to the left side which had more normal  anatomy.  Again, we freed up the superior pole and then divided a few of  the inferior pole vessels with the Harmonic, rotated the thyroid  medially and dissected it off of the trachea, staying well up on the  thyroid.  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  Again, the nerve was identified and we stayed  well away from that and up on the thyroid.  Once the thyroid was  completely removed, it was oriented with a suture for Pathology.  The  extra nodule on the right side appeared to be just barely attached to  the thyroid, and again it was not clear whether this was the papillary  tumor or a large lymph node but all of this came  out intact and as one  specimen.   I irrigated and made sure everything was dry.  Once everything appeared  to be dry, we reinspected the areas of the nerves and the parathyroids  and those appeared to be okay.  I left some Surgicel in and then we  closed.   The midline was closed with 3-0 Vicryl as was the platysma.  The skin  was closed with some staples and Steri-Strips.   The patient tolerated the procedure well and there were no operative  complications.  All counts were correct.      Currie Paris, M.D.  Electronically Signed     CJS/MEDQ  D:  01/23/2007  T:  01/23/2007  Job:  [id number]   cc:   Loraine Leriche A. Perini, M.D.  Fax: [phone]

## 2011-05-11 NOTE — Assessment & Plan Note (Signed)
[facility]                             PULMONARY OFFICE NOTE   MYRLE, DUES                   MRN:          [id number]  DATE:12/02/2006                            DOB:          02/14/1931    DISCUSSION:  I have reviewed the apnea link report for Mr. Hornberger,  which was done on November 29, 2006, and this showed overall  apnea/hypopnea index of 14 and respiratory disturbance index of 20 with  an oxygen saturation nadir of 91%.A  I discussed these results with Mr.  Waltermire and I informed him that they are suggestive that he likely has  some degree of sleep disorder breathing.A  What I would recommend at  this time, since he is quite eager to undergo his surgery, is that I do  not feel that we would need to delay his surgery because of these  results.A  Instead, what I would recommend is that appropriate  precautions be taken by anesthesiology during his perioperative period,  specifically with regards to airway management as well as possibly  needing empiric auto CPAP therapy during his postoperative course.A  I  discussed with Mr. Ilic that at some point he would need to have  further evaluation to determine if, in fact, he does have obstructive  sleep apnea and if he does, what the severity of this is but, again, I  do not feel that he would need to have his surgery delayed on the basis  of this.A     Coralyn Helling, MD  Electronically Signed    VS/MedQ  DD: 12/02/2006  DT: 12/03/2006  Job #: [id number]   cc:   Loraine Leriche A. Perini, M.D.  Currie Paris, M.D.

## 2011-05-11 NOTE — Op Note (Signed)
NAMEANTARIO, YASUDA            ACCOUNT NO.:  [id number]   MEDICAL RECORD NO.:  [id number]          PATIENT TYPE:  INP   LOCATION:  X004                         FACILITY:  [facility]   PHYSICIAN:  Currie Paris, M.D.DATE OF BIRTH:  1931-11-01   DATE OF PROCEDURE:  12/19/2006  DATE OF DISCHARGE:                               OPERATIVE REPORT   OFFICE MEDICAL RECORD NUMBER [id number] [phone].   PREOPERATIVE DIAGNOSIS:  Polypoid lesion, sigmoid colon, probably  carcinoma.   POSTOPERATIVE DIAGNOSIS:  Polypoid lesion, sigmoid colon, probably  carcinoma.   OPERATIONS:  Sigmoid colectomy with primary anastomosis.   SURGEON:  Currie Paris, M.D.   ASSESSMENT:  Angelia Mould. Derrell Lolling, M.D.   ANESTHESIA:  General endotracheal.   CLINICAL HISTORY:  Mr. Solivan is a 77 year old gentleman then  incidentally found to have a large polypoid lesion in the sigmoid colon  which clinically appeared malignant, although a biopsy just showed high-  grade dysplasia.  He was also noted to have thyroid carcinoma.  After  preoperative clearance by cardiology and pulmonary (he has significant  COPD), he was scheduled for sigmoid colectomy.  The plan would be to  follow this in a few weeks with a thyroidectomy.   DESCRIPTION OF PROCEDURE:  The patient was seen in the holding area and  he had no further questions.  He confirmed colectomy was the planned  procedure.  The patient was taken to the operating room.  After  satisfactory general endotracheal anesthesia obtained, the abdomen was  clipped.  A Foley catheter was placed and the abdomen prepped and  draped.  A time-out occurred.   I made a midline incision from umbilicus to pubis and entered the  peritoneal cavity under direct vision.  Bimanual exam of the abdomen  showed no gross abnormalities.Theliver was normal.  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  There was no evidence of any  hematoma or evidence that it had been  expanding.   The small bowel was packed out of the pelvis and I could then palpate  the colon lesion and actually see some serosal changes on the anterior  border of the sigmoid.   The sigmoid colon was then mobilized as well as a portion of the  descending colon so we could do a sigmoid resection.  Once I had that  mobilized. I mobilized the pelvic portion of the rectum so that we had  it up nicely and no tension.  Identified the ureters and stayed well  away from those.   I then selected an area well proximal to the tumor for division of the  colon, made a small window in the mesentery, and divided the colon  between bowel clamps.   Using the LigaSure, I then divided the mesentery down to its base.  I  then completed it along distally and then back up to the rectum well  beyond the tumor.  I thought we had a good resection of the mesentery  right down to the base.  I then divided the distal colon using a  Satinsky clamp as a  cutting guide.   We had no tension and I then proceeded to do a single layer 3-0 silk  anastomosis.  The knots were all on the inside for the back row and then  the front row inverted with the last few being some Gambee sutures to  duck the serosa.  This produced a patent anastomosis.  It appeared  intact with a good blood supply.  There was absolutely no tension and it  lay nicely in the pelvis.   I went ahead and applied Tisseel to the entire anastomosis and then  placed it back in the pelvis.  I tacked the mesentery down to the  perineum posteriorly to be sure there were no internal hernia locations.   The small bowel was replaced in the pelvis and the omentum pulled down  over the wound.  Prior to putting the Tisseel on, we irrigated and had  checked for hemostasis.   The abdomen was closed with a running PDS on the peritoneum and a  running #1 Novofil on the fascia.  Subcu was irrigated and closed with  staples.   The patient tolerated the  procedure well.  There no operative  complications.  All counts were correct.  Estimated blood loss was 100  mL.      Currie Paris, M.D.  Electronically Signed     CJS/MEDQ  D:  12/19/2006  T:  12/19/2006  Job:  [id number]   cc:   Loraine Leriche A. Perini, M.D.  Fax: [phone]   Wilhemina Bonito. Marina Goodell, MD  [address]  [place]  [place] [address]

## 2011-05-11 NOTE — Assessment & Plan Note (Signed)
[facility]                           GASTROENTEROLOGY OFFICE NOTE   RANA, ADORNO                   MRN:          [id number]  DATE:09/30/2006                            DOB:          1931-05-03    REFERRING PHYSICIAN:  Loraine Leriche A. Perini, M.D.   REASON FOR CONSULTATION:  Hemoccult positive stool.   HISTORY OF PRESENT ILLNESS:  This is a 77 year old white male with a history  of hypertension, emphysema, hyperlipidemia, ongoing tobacco use and prior  stroke for which he is on Aggrenox.  He is referred through the courtesy of  Dr. Waynard Edwards regarding hemoccult positive stool as identified on outpatient  testing.  As part of his recent comprehensive exam.  Blood work at that time  revealed no evidence of anemia as his hemoglobin was 16.3.  The patient has  a remote history of peptic ulcer disease.  He has chronic dyspeptic  complaints for which he takes Omeprazole periodically.  No dysphagia or  heartburn.  No overwhelming abdominal pain, just what he describes as uneasy  or discomfort.  He occasionally has some urgency to his bowel movements and  rarely sees pink mucous material on the toilet paper after defecating.  He  had CVA x2 in the past with some residual leg weakness on the left.  He has  been on Aggrenox for about one year.   PAST MEDICAL HISTORY:  As above.   PAST SURGICAL HISTORY:  None.   ALLERGIES:  No known drug allergies.   CURRENT MEDICATIONS:  1. Combivent two puffs b.i.d.  2. Advair 250/50 t.i.d.  3. Aggrenox 200/25 mg t.i.d.  4. Omeprazole 20 mg p.r.n.  5. Cozaar 100 mg daily.  6. Niaspan 500 mg at night.  7. Crestor 20 mg at night.   FAMILY HISTORY:  Two sons with colon polyps.   SOCIAL HISTORY:  The patient is married, is accompanied by his wife.  They  have three children.  He is a retired Personnel officer, he smokes, does not use  alcohol.   REVIEW OF SYSTEMS:  Per diagnostic evaluation form.   PHYSICAL  EXAMINATION:  GENERAL:  Elderly male in no acute distress.  He is  alert, oriented.  VITAL SIGNS:  His blood pressure is 144/70, heart rate 60 and regular,  weight 179.4 pounds, height 6 feet.  HEENT:  Sclerae are anicteric.  Conjunctivae are pink.  Oral mucosa intact.  No adenopathy.  LUNGS:  Clear.  HEART:  Regular.  ABDOMEN:  Soft without tenderness, mass or hernia.  Good bowel sounds heard.  RECTAL:  Deferred.  EXTREMITIES:  Without edema.   IMPRESSION:  This is a 77 year old gentleman with history of peptic ulcer  disease, chronic dyspepsia, hemoccult positive stool.  He is not anemic.  Possible causes for his hemoccult positive stool include occult ulcer  disease, neoplasia or AVM's.  Also, more benign causes such as hemorrhoids.   RECOMMENDATIONS:  Colonoscopy and upper endoscopy to exclude mucosal lesions  of the gut to explain occult positive stool, as well as provide colorectal  neoplasia screening and evaluate chronic dyspepsia in the background of  remote ulcer disease.  The nature of the procedures as well as the risks,  benefits and alternatives were discussed in detail.  He understood and  agreed to proceed.  In addition, we discussed the pros and cons of  discontinuing his Aggrenox.  We have elected to keep him on the Aggrenox  during his exam.  Small polyps can be removed on Aggrenox and biopsies  taken.  However, large pathology might require a second procedure off  medication.  Again, he understood and agreed with this strategy.            ______________________________  Wilhemina Bonito. Eda Keys., MD      JNP/MedQ  DD:  09/30/2006  DT:  10/01/2006  Job #:  [id number]   cc:   Loraine Leriche A. Perini, M.D.

## 2011-05-11 NOTE — Discharge Summary (Signed)
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   CLINICAL HISTORY:  [name] is a 77 year old gentleman who has COPD  and on a recent evaluation for that was found to have a thyroid nodule.  A biopsy showed what appeared to be a papillary carcinoma.   HOSPITAL COURSE:  Patient was admitted and taken to the operating room  where a total thyroidectomy was performed.  The patient tolerated the  procedure well.  The evening after surgery, he felt well and his voice  was slightly scratchy.  His calcium was noted to be 9.1.  The next  morning his voice was back to normal.  He felt fine and was felt able to  go home.  His wound weakness clean with no infection.  His calcium was  8.8.  Final pathology report was pending.   CONDITION ON DISCHARGE:  Satisfactory.   To begin on low dose Synthroid.  To follow up in my office in  approximately one week.      Currie Paris, M.D.  Electronically Signed     CJS/MEDQ  D:  02/19/2007  T:  02/19/2007  Job:  [id number]   cc:   Loraine Leriche A. Perini, M.D.  Fax: [phone]

## 2011-08-10 ENCOUNTER — Encounter (INDEPENDENT_AMBULATORY_CARE_PROVIDER_SITE_OTHER): Payer: Medicare Other

## 2011-08-10 ENCOUNTER — Other Ambulatory Visit: Payer: Self-pay | Admitting: Lab

## 2011-08-10 DIAGNOSIS — I714 Abdominal aortic aneurysm, without rupture: Secondary | ICD-10-CM

## 2011-08-17 NOTE — Procedures (Unsigned)
DUPLEX ULTRASOUND OF ABDOMINAL AORTA  INDICATION:  Followup abdominal aortic aneurysm.  HISTORY: Diabetes:  Yes. Cardiac:  No. Hypertension:  Yes. Smoking:  Previous. Connective Tissue Disorder: Family History:  No. Previous Surgery:  No.  DUPLEX EXAM:         AP (cm)                   TRANSVERSE (cm) Proximal             3.42 cm                   3.23 cm Mid                  3.32 cm                   2.73 cm Distal               4.63 cm                   4.99 cm Right Iliac          0.95 cm                   1.12 cm Left Iliac           1.15 cm                   1.21 cm  PREVIOUS:  Date:  08/21/2010  AP:  4.9  TRANSVERSE:  4.7  IMPRESSION: 1. Aneurysmal dilatation of the proximal aorta measuring 3.42 cm x     3.23 cm. 2. Aneurysmal dilatation of the distal aorta measuring 4.63 cm x 4.99     cm with intramural thrombus present. 3. Essentially unchanged since study done on 08/21/2010.  ___________________________________________ Larina Earthly, M.D.  SH/MEDQ  D:  08/10/2011  T:  08/10/2011  Job:  [id number]

## 2011-09-26 ENCOUNTER — Other Ambulatory Visit: Payer: Self-pay | Admitting: Internal Medicine

## 2011-09-26 ENCOUNTER — Ambulatory Visit
Admission: RE | Admit: 2011-09-26 | Discharge: 2011-09-26 | Disposition: A | Discharge status: Home / Self Care | Payer: Medicare Other | Source: Ambulatory Visit | Attending: Internal Medicine | Admitting: Internal Medicine

## 2011-09-26 DIAGNOSIS — M545 Low back pain: Secondary | ICD-10-CM

## 2011-09-28 ENCOUNTER — Other Ambulatory Visit: Payer: Medicare Other

## 2011-10-03 ENCOUNTER — Ambulatory Visit
Admission: RE | Admit: 2011-10-03 | Discharge: 2011-10-03 | Disposition: A | Discharge status: Home / Self Care | Payer: Medicare Other | Source: Ambulatory Visit | Attending: Internal Medicine | Admitting: Internal Medicine

## 2011-10-03 DIAGNOSIS — M545 Low back pain: Secondary | ICD-10-CM

## 2012-01-04 DIAGNOSIS — R062 Wheezing: Secondary | ICD-10-CM | POA: Insufficient documentation

## 2012-01-09 ENCOUNTER — Encounter: Payer: Self-pay | Admitting: Internal Medicine

## 2012-02-18 ENCOUNTER — Encounter (INDEPENDENT_AMBULATORY_CARE_PROVIDER_SITE_OTHER): Payer: Medicare Other | Admitting: *Deleted

## 2012-02-18 DIAGNOSIS — I719 Aortic aneurysm of unspecified site, without rupture: Secondary | ICD-10-CM

## 2012-03-14 ENCOUNTER — Other Ambulatory Visit: Payer: Self-pay | Admitting: *Deleted

## 2012-03-14 ENCOUNTER — Encounter: Payer: Self-pay | Admitting: Vascular Surgery

## 2012-03-14 DIAGNOSIS — I714 Abdominal aortic aneurysm, without rupture: Secondary | ICD-10-CM

## 2012-03-14 NOTE — Procedures (Unsigned)
DUPLEX ULTRASOUND OF ABDOMINAL AORTA  INDICATION:  Followup abdominal aortic aneurysm  HISTORY: Diabetes:  Yes Cardiac:  No Hypertension:  Yes Smoking:  Previous Connective Tissue Disorder: Family History: Previous Surgery:  DUPLEX EXAM:         AP (cm)                   TRANSVERSE (cm) Proximal             2.62 cm                   3.11 cm Mid                  3.00 cm                   2.90 cm Distal               4.68 cm                   4.9 cm Right Iliac          0.78 cm                   0.78 cm Left Iliac           0.88 cm                   0.91 cm  PREVIOUS:  Date:  08/21/2010  AP:  4.9  TRANSVERSE:  4.7  IMPRESSION: 1. Aneurysmal dilatation of the distal aorta measuring 4.68 x 4.9 cm     with intramural thrombus visualized. 2. No significant change since prior study of 08/21/2010.  ___________________________________________ Larina Earthly, M.D.  SS/MEDQ  D:  02/18/2012  T:  02/18/2012  Job:  [id number]

## 2012-03-27 ENCOUNTER — Encounter: Payer: Self-pay | Admitting: *Deleted

## 2012-05-14 ENCOUNTER — Encounter: Payer: Self-pay | Admitting: Internal Medicine

## 2012-05-14 ENCOUNTER — Ambulatory Visit (INDEPENDENT_AMBULATORY_CARE_PROVIDER_SITE_OTHER): Payer: Medicare Other | Admitting: Internal Medicine

## 2012-05-14 VITALS — BP 124/68 | HR 68 | Ht 72.0 in | Wt 194.0 lb

## 2012-05-14 DIAGNOSIS — E119 Type 2 diabetes mellitus without complications: Secondary | ICD-10-CM

## 2012-05-14 DIAGNOSIS — Z860100 Personal history of colon polyps, unspecified: Secondary | ICD-10-CM

## 2012-05-14 DIAGNOSIS — Z85038 Personal history of other malignant neoplasm of large intestine: Secondary | ICD-10-CM

## 2012-05-14 DIAGNOSIS — I639 Cerebral infarction, unspecified: Secondary | ICD-10-CM

## 2012-05-14 DIAGNOSIS — I6350 Cerebral infarction due to unspecified occlusion or stenosis of unspecified cerebral artery: Secondary | ICD-10-CM

## 2012-05-14 MED ORDER — PEG-KCL-NACL-NASULF-NA ASC-C 100 G PO SOLR: 1.0000 | Freq: Once | ORAL | Status: DC

## 2012-05-14 NOTE — Patient Instructions (Signed)
You have been scheduled for a colonoscopy with propofol. Please follow written instructions given to you at your visit today.  Please pick up your prep kit at the pharmacy within the next 1-3 days.  

## 2012-05-14 NOTE — Progress Notes (Signed)
HISTORY OF PRESENT ILLNESS:  David [name] is a 78 y.o. male with multiple significant medical problems as listed below. He has a history of distal sigmoid colon cancer in 2007 as well as a history of adenomatous colon polyps. He presents today regarding surveillance colonoscopy. His last examination was performed 02/02/2009. At that time, he had a normal postoperative colon without polyps. Followup in 3 years recommended. The patient is now on Plavix for history of stroke. Previously on Aggrenox (when we did his last exam). He is also on Januvia for diabetes. Since his last colonoscopy he has enjoyed good health. No hospitalizations or surgeries. His chronic medical problems are stable. He sees Dr. Waynard Edwards regularly. Laboratories from March 2013 have been reviewed and are unremarkable, including a hemoglobin of 15.0. He is accompanied by his wife  REVIEW OF SYSTEMS:  All non-GI ROS negative except for COPD related shortness of breath and back pain  Past Medical History  Diagnosis Date  . COPD (chronic obstructive pulmonary disease)   . Hypertension   . Gastroesophageal reflux disease   . Hyperlipidemia   . Stroke   . AAA (abdominal aortic aneurysm)   . Thyroid disease   . Lung nodules   . Colon cancer   . Diabetes mellitus   . ED (erectile dysfunction)   . BPH (benign prostatic hyperplasia)   . CKD (chronic kidney disease), stage III   . Positional vertigo   . Osteopenia   . Stroke   . AAA (abdominal aortic aneurysm)   . Vitamin d deficiency   . Atherosclerosis   . Peripheral vascular disease     Past Surgical History  Procedure Date  . Colon resection 2007  . Thyroidectomy 2008  . Septoplasty     Social History CASON LUFFMAN  reports that he has quit smoking. He has never used smokeless tobacco. He reports that he does not drink alcohol or use illicit drugs.  family history includes Breast cancer in his mother; Diabetes in his paternal uncle; Heart failure in his  mother; Pneumonia in his father; and Stomach cancer in his mother.  No Known Allergies     PHYSICAL EXAMINATION: Vital signs: BP 124/68  Pulse 68  Wt 194 lb (87.998 kg)  Constitutional: generally well-appearing, no acute distress Psychiatric: alert and oriented x3, cooperative Eyes: extraocular movements intact, anicteric, conjunctiva pink Mouth: oral pharynx moist, no lesions Neck: supple no lymphadenopathy Cardiovascular: heart regular rate and rhythm, no murmur Lungs: clear to auscultation bilaterally Abdomen: soft, nontender, nondistended, no obvious ascites, no peritoneal signs, normal bowel sounds, no organomegaly Rectal:deferred until colonoscopy Extremities: no lower extremity edema bilaterally Skin: no lesions on visible extremities, except for ecchymoses Neuro: No focal deficits.   ASSESSMENT:  #1. Personal history of sigmoid colon cancer 2007 #2. Personal history of adenomatous colon polyps #3. Multiple medical problems including COPD, diabetes, and prior stroke. On Plavix   PLAN:  #1. Surveillance colonoscopy. The patient is high-risk given his comorbidities and medications.The nature of the procedure, as well as the risks, benefits, and alternatives were carefully and thoroughly reviewed with the patient. Ample time for discussion and questions allowed. The patient understood, was satisfied, and agreed to proceed.  #2. CRNA supervised propofol administration for sedation #3. Continue on Plavix for the procedure. We discussed the pros and cons of this strategy #4. Hold diabetic medication the day of the examination. We will monitor his blood sugar before and after the procedure.

## 2012-05-24 DIAGNOSIS — J189 Pneumonia, unspecified organism: Secondary | ICD-10-CM

## 2012-05-24 HISTORY — DX: Pneumonia, unspecified organism: J18.9

## 2012-05-28 ENCOUNTER — Ambulatory Visit (AMBULATORY_SURGERY_CENTER): Payer: Medicare Other | Admitting: Internal Medicine

## 2012-05-28 ENCOUNTER — Encounter: Payer: Self-pay | Admitting: Internal Medicine

## 2012-05-28 VITALS — BP 126/79 | HR 65 | Temp 98.3°F | Resp 17 | Ht 72.0 in | Wt 194.0 lb

## 2012-05-28 DIAGNOSIS — Z1211 Encounter for screening for malignant neoplasm of colon: Secondary | ICD-10-CM

## 2012-05-28 DIAGNOSIS — Z85038 Personal history of other malignant neoplasm of large intestine: Secondary | ICD-10-CM

## 2012-05-28 DIAGNOSIS — Z85850 Personal history of malignant neoplasm of thyroid: Secondary | ICD-10-CM

## 2012-05-28 DIAGNOSIS — Z86010 Personal history of colonic polyps: Secondary | ICD-10-CM

## 2012-05-28 DIAGNOSIS — D126 Benign neoplasm of colon, unspecified: Secondary | ICD-10-CM

## 2012-05-28 MED ORDER — SODIUM CHLORIDE 0.9 % IV SOLN: 500.0000 mL | INTRAVENOUS | Status: DC

## 2012-05-28 NOTE — Progress Notes (Signed)
Patient did not experience any of the following events: a burn prior to discharge; a fall within the facility; wrong site/side/patient/procedure/implant event; or a hospital transfer or hospital admission upon discharge from the facility. (G8907) Patient did not have preoperative order for IV antibiotic SSI prophylaxis. (G8918)  

## 2012-05-28 NOTE — Progress Notes (Signed)
Pressure applied to the abdomen to reach the cecum 

## 2012-05-28 NOTE — Patient Instructions (Signed)
YOU HAD AN ENDOSCOPIC PROCEDURE TODAY AT THE [facility]: Refer to the procedure report that was given to you for any specific questions about what was found during the examination.  If the procedure report does not answer your questions, please call your gastroenterologist to clarify.  If you requested that your care partner not be given the details of your procedure findings, then the procedure report has been included in a sealed envelope for you to review at your convenience later.  YOU SHOULD EXPECT: Some feelings of bloating in the abdomen. Passage of more gas than usual.  Walking can help get rid of the air that was put into your GI tract during the procedure and reduce the bloating. If you had a lower endoscopy (such as a colonoscopy or flexible sigmoidoscopy) you may notice spotting of blood in your stool or on the toilet paper. If you underwent a bowel prep for your procedure, then you may not have a normal bowel movement for a few days.  DIET: Your first meal following the procedure should be a light meal and then it is ok to progress to your normal diet.  A half-sandwich or bowl of soup is an example of a good first meal.  Heavy or fried foods are harder to digest and may make you feel nauseous or bloated.  Likewise meals heavy in dairy and vegetables can cause extra gas to form and this can also increase the bloating.  Drink plenty of fluids but you should avoid alcoholic beverages for 24 hours.  ACTIVITY: Your care partner should take you home directly after the procedure.  You should plan to take it easy, moving slowly for the rest of the day.  You can resume normal activity the day after the procedure however you should NOT DRIVE or use heavy machinery for 24 hours (because of the sedation medicines used during the test).    SYMPTOMS TO REPORT IMMEDIATELY: A gastroenterologist can be reached at any hour.  During normal business hours, 8:30 AM to 5:00 PM Monday through Friday,  call [phone].  After hours and on weekends, please call the GI answering service at [phone] who will take a message and have the physician on call contact you.   Following lower endoscopy (colonoscopy or flexible sigmoidoscopy):  Excessive amounts of blood in the stool  Significant tenderness or worsening of abdominal pains  Swelling of the abdomen that is new, acute  Fever of 100F or higher   FOLLOW UP: If any biopsies were taken you will be contacted by phone or by letter within the next 1-3 weeks.  Call your gastroenterologist if you have not heard about the biopsies in 3 weeks.  Our staff will call the home number listed on your records the next business day following your procedure to check on you and address any questions or concerns that you may have at that time regarding the information given to you following your procedure. This is a courtesy call and so if there is no answer at the home number and we have not heard from you through the emergency physician on call, we will assume that you have returned to your regular daily activities without incident.  SIGNATURES/CONFIDENTIALITY: You and/or your care partner have signed paperwork which will be entered into your electronic medical record.  These signatures attest to the fact that that the information above on your After Visit Summary has been reviewed and is understood.  Full responsibility of the confidentiality of   this discharge information lies with you and/or your care-partner.   INFORMATION ON POLYPS GIVEN TO YOU TODAY 

## 2012-05-28 NOTE — Op Note (Signed)
[facility] [address] [place], [place]  [address]  COLONOSCOPY PROCEDURE REPORT  PATIENT:  David [name], David [name]  MR#:  [id number] BIRTHDATE:  1931-04-19, 80 yrs. old  GENDER:  male ENDOSCOPIST:  Wilhemina Bonito. Eda Keys, MD REF. BY:  Surveillance Program Recall, PROCEDURE DATE:  05/28/2012 PROCEDURE:  Colonoscopy with snare polypectomy x 2 ASA CLASS:  Class III INDICATIONS:  history of colon cancer, history of pre-cancerous (adenomatous) colon polyps, surveillance and high-risk screening ; CRC sigmoid - 2007; f/u 2008, 2020 w/ TAs MEDICATIONS:   MAC sedation, administered by CRNA, propofol (Diprivan) 130 mg IV  DESCRIPTION OF PROCEDURE:   After the risks benefits and alternatives of the procedure were thoroughly explained, informed consent was obtained.  Digital rectal exam was performed and revealed no abnormalities.   The LB CF-[id number] endoscope was introduced through the anus and advanced to the cecum, which was identified by both the appendix and ileocecal valve, without limitations.  The quality of the prep was excellent, using MoviPrep.  The instrument was then slowly withdrawn as the colon was fully examined. <<PROCEDUREIMAGES>>  FINDINGS:  Two 3mm polyps were found in the ascending and transverse colon. Polyps were snared without cautery. Retrieval was successful.  There was evidence of a prior segmental colectomy in the sigmoid colon.  Otherwise normal colonoscopy without other polyps, masses, vascular ectasias, or inflammatory changes. Retroflexed views in the rectum revealed no abnormalities.    The time to cecum = 2:04  minutes. The scope was then withdrawn in 9:51  minutes from the cecum and the procedure completed. COMPLICATIONS:  None  ENDOSCOPIC IMPRESSION: 1) Two polyps in the  colon - removed 2) Prior segmental colectomy in the sigmoid colon 3) Otherwise normal colonoscopy  RECOMMENDATIONS: 1) Follow up colonoscopy in 5  years  ______________________________ Wilhemina Bonito. Eda Keys, MD  CC:  Rodrigo Ran, MD;  The Patient  n. eSIGNED:   Wilhemina Bonito. Eda Keys at 05/28/2012 12:12 PM  Daiva Nakayama, [phone]

## 2012-05-29 ENCOUNTER — Telehealth: Payer: Self-pay | Admitting: *Deleted

## 2012-05-29 NOTE — Telephone Encounter (Signed)
  Follow up Call-  Call back number 05/28/2012  Post procedure Call Back phone  # [phone]  Permission to leave phone message Yes     Patient questions:  Do you have a fever, pain , or abdominal swelling? no Pain Score  0 *  Have you tolerated food without any problems? yes  Have you been able to return to your normal activities? yes  Do you have any questions about your discharge instructions: Diet   no Medications  no Follow up visit  no  Do you have questions or concerns about your Care? no  Actions: * If pain score is 4 or above: No action needed, pain <4.

## 2012-06-02 ENCOUNTER — Encounter: Payer: Self-pay | Admitting: Internal Medicine

## 2012-06-13 ENCOUNTER — Encounter (HOSPITAL_COMMUNITY): Payer: Self-pay | Admitting: Emergency Medicine

## 2012-06-13 ENCOUNTER — Inpatient Hospital Stay (HOSPITAL_COMMUNITY)
Admission: EM | Admit: 2012-06-13 | Discharge: 2012-06-17 | DRG: 193 | Disposition: A | Discharge status: Home Health | Payer: Medicare Other | Attending: Internal Medicine | Admitting: Internal Medicine

## 2012-06-13 ENCOUNTER — Emergency Department (HOSPITAL_COMMUNITY): Payer: Medicare Other

## 2012-06-13 DIAGNOSIS — J438 Other emphysema: Secondary | ICD-10-CM

## 2012-06-13 DIAGNOSIS — E1165 Type 2 diabetes mellitus with hyperglycemia: Secondary | ICD-10-CM

## 2012-06-13 DIAGNOSIS — E119 Type 2 diabetes mellitus without complications: Secondary | ICD-10-CM

## 2012-06-13 DIAGNOSIS — N400 Enlarged prostate without lower urinary tract symptoms: Secondary | ICD-10-CM | POA: Diagnosis present

## 2012-06-13 DIAGNOSIS — R5381 Other malaise: Secondary | ICD-10-CM | POA: Diagnosis present

## 2012-06-13 DIAGNOSIS — I714 Abdominal aortic aneurysm, without rupture, unspecified: Secondary | ICD-10-CM | POA: Diagnosis present

## 2012-06-13 DIAGNOSIS — Z87891 Personal history of nicotine dependence: Secondary | ICD-10-CM

## 2012-06-13 DIAGNOSIS — I509 Heart failure, unspecified: Secondary | ICD-10-CM | POA: Diagnosis present

## 2012-06-13 DIAGNOSIS — D696 Thrombocytopenia, unspecified: Secondary | ICD-10-CM

## 2012-06-13 DIAGNOSIS — J189 Pneumonia, unspecified organism: Principal | ICD-10-CM

## 2012-06-13 DIAGNOSIS — K219 Gastro-esophageal reflux disease without esophagitis: Secondary | ICD-10-CM | POA: Diagnosis present

## 2012-06-13 DIAGNOSIS — R197 Diarrhea, unspecified: Secondary | ICD-10-CM | POA: Diagnosis present

## 2012-06-13 DIAGNOSIS — I5033 Acute on chronic diastolic (congestive) heart failure: Secondary | ICD-10-CM | POA: Diagnosis present

## 2012-06-13 DIAGNOSIS — I10 Essential (primary) hypertension: Secondary | ICD-10-CM

## 2012-06-13 DIAGNOSIS — N184 Chronic kidney disease, stage 4 (severe): Secondary | ICD-10-CM | POA: Diagnosis present

## 2012-06-13 DIAGNOSIS — I6350 Cerebral infarction due to unspecified occlusion or stenosis of unspecified cerebral artery: Secondary | ICD-10-CM | POA: Diagnosis present

## 2012-06-13 DIAGNOSIS — I129 Hypertensive chronic kidney disease with stage 1 through stage 4 chronic kidney disease, or unspecified chronic kidney disease: Secondary | ICD-10-CM | POA: Diagnosis present

## 2012-06-13 DIAGNOSIS — J9600 Acute respiratory failure, unspecified whether with hypoxia or hypercapnia: Secondary | ICD-10-CM | POA: Diagnosis present

## 2012-06-13 DIAGNOSIS — E785 Hyperlipidemia, unspecified: Secondary | ICD-10-CM

## 2012-06-13 DIAGNOSIS — C189 Malignant neoplasm of colon, unspecified: Secondary | ICD-10-CM

## 2012-06-13 DIAGNOSIS — J449 Chronic obstructive pulmonary disease, unspecified: Secondary | ICD-10-CM | POA: Diagnosis present

## 2012-06-13 DIAGNOSIS — Z79899 Other long term (current) drug therapy: Secondary | ICD-10-CM

## 2012-06-13 DIAGNOSIS — R55 Syncope and collapse: Secondary | ICD-10-CM | POA: Diagnosis present

## 2012-06-13 DIAGNOSIS — I739 Peripheral vascular disease, unspecified: Secondary | ICD-10-CM | POA: Diagnosis present

## 2012-06-13 DIAGNOSIS — E039 Hypothyroidism, unspecified: Secondary | ICD-10-CM | POA: Diagnosis present

## 2012-06-13 DIAGNOSIS — Z8673 Personal history of transient ischemic attack (TIA), and cerebral infarction without residual deficits: Secondary | ICD-10-CM

## 2012-06-13 DIAGNOSIS — R0902 Hypoxemia: Secondary | ICD-10-CM

## 2012-06-13 DIAGNOSIS — T363X5A Adverse effect of macrolides, initial encounter: Secondary | ICD-10-CM | POA: Diagnosis present

## 2012-06-13 DIAGNOSIS — M899 Disorder of bone, unspecified: Secondary | ICD-10-CM | POA: Diagnosis present

## 2012-06-13 DIAGNOSIS — Z85850 Personal history of malignant neoplasm of thyroid: Secondary | ICD-10-CM

## 2012-06-13 DIAGNOSIS — Z85038 Personal history of other malignant neoplasm of large intestine: Secondary | ICD-10-CM

## 2012-06-13 HISTORY — DX: Malignant neoplasm of thyroid gland: C73

## 2012-06-13 LAB — BASIC METABOLIC PANEL WITH GFR
BUN: 32 mg/dL — ABNORMAL HIGH (ref 6–23)
CO2: 22 meq/L (ref 19–32)
Calcium: 9 mg/dL (ref 8.4–10.5)
Chloride: 104 meq/L (ref 96–112)
Creatinine, Ser: 1.69 mg/dL — ABNORMAL HIGH (ref 0.50–1.35)
GFR calc Af Amer: 43 mL/min — ABNORMAL LOW
GFR calc non Af Amer: 37 mL/min — ABNORMAL LOW
Glucose, Bld: 176 mg/dL — ABNORMAL HIGH (ref 70–99)
Potassium: 4.1 meq/L (ref 3.5–5.1)
Sodium: 138 meq/L (ref 135–145)

## 2012-06-13 LAB — CBC
HCT: 42.7 % (ref 39.0–52.0)
MCH: 31.2 pg (ref 26.0–34.0)
MCV: 93.8 fL (ref 78.0–100.0)
Platelets: 153 10*3/uL (ref 150–400)
RDW: 14.3 % (ref 11.5–15.5)
WBC: 11.2 10*3/uL — ABNORMAL HIGH (ref 4.0–10.5)

## 2012-06-13 LAB — URINALYSIS, ROUTINE W REFLEX MICROSCOPIC
Bilirubin Urine: NEGATIVE
Hgb urine dipstick: NEGATIVE
Specific Gravity, Urine: 1.021 (ref 1.005–1.030)
pH: 5 (ref 5.0–8.0)

## 2012-06-13 LAB — TROPONIN I: Troponin I: <0.3 ng/mL (ref <0.30)

## 2012-06-13 MED ORDER — ALBUTEROL SULFATE (5 MG/ML) 0.5% IN NEBU
INHALATION_SOLUTION | RESPIRATORY_TRACT | Status: AC
  Administered 2012-06-13: 21:00:00
  Filled 2012-06-13: qty 1

## 2012-06-13 MED ORDER — ACETAMINOPHEN 500 MG PO TABS
1000.0000 mg | ORAL_TABLET | Freq: Once | ORAL | Status: AC
  Administered 2012-06-13: 1000 mg via ORAL
  Filled 2012-06-13: qty 2

## 2012-06-13 MED ORDER — AZITHROMYCIN 500 MG IV SOLR
500.0000 mg | Freq: Once | INTRAVENOUS | Status: DC
  Administered 2012-06-14: 500 mg via INTRAVENOUS
  Filled 2012-06-13: qty 500

## 2012-06-13 MED ORDER — ASPIRIN 325 MG PO TABS: 325.0000 mg | ORAL_TABLET | ORAL | Status: DC

## 2012-06-13 MED ORDER — ONDANSETRON HCL 4 MG/2ML IJ SOLN
INTRAMUSCULAR | Status: AC
  Administered 2012-06-13: 21:00:00
  Filled 2012-06-13: qty 2

## 2012-06-13 MED ORDER — DEXTROSE 5 % IV SOLN
1.0000 g | Freq: Once | INTRAVENOUS | Status: AC
  Administered 2012-06-13: 1 g via INTRAVENOUS
  Filled 2012-06-13: qty 10

## 2012-06-13 MED ORDER — SODIUM CHLORIDE 0.9 % IV BOLUS (SEPSIS)
1000.0000 mL | Freq: Once | INTRAVENOUS | Status: AC
  Administered 2012-06-13: 1000 mL via INTRAVENOUS

## 2012-06-13 MED ORDER — ASPIRIN 81 MG PO CHEW
CHEWABLE_TABLET | ORAL | Status: AC
  Administered 2012-06-13: 324 mg
  Filled 2012-06-13: qty 4

## 2012-06-13 NOTE — ED Notes (Signed)
ZOX:[id number]<AV> Expected date:06/13/12<BR> Expected time: 8:35 PM<BR> Means of arrival:Ambulance<BR> Comments:<BR> Elderly; n/v

## 2012-06-13 NOTE — ED Provider Notes (Signed)
History     CSN: [id number]  Arrival date & time 06/13/12  2049   First MD Initiated Contact with Patient 06/13/12 2116      Chief Complaint  Patient presents with  . Nausea  . Near Syncope     The history is provided by the patient.   the patient reports developing fever and chills this afternoon and generalized weakness.  He reports his been feeling poorly most of the day but his continue to work on his tractor.  He denies diarrhea.  He does report developing acute nausea and vomiting this evening.  He became generally weak with a sitting on the commode and felt too weak to get off the commode.  He received some breathing treatments in route with improvement in his symptoms.  He has non oxygen dependent COPD.  Nothing worsens the symptoms.  Nothing improves his symptoms.  His symptoms he had some chest discomfort and chest pressure which has now since resolved.  Past Medical History  Diagnosis Date  . COPD (chronic obstructive pulmonary disease)   . Hypertension   . Gastroesophageal reflux disease   . Hyperlipidemia   . AAA (abdominal aortic aneurysm)   . Thyroid disease   . Lung nodules   . Diabetes mellitus   . ED (erectile dysfunction)   . BPH (benign prostatic hyperplasia)   . CKD (chronic kidney disease), stage III   . Positional vertigo   . Osteopenia   . AAA (abdominal aortic aneurysm)   . Vitamin d deficiency   . Atherosclerosis   . Peripheral vascular disease   . Colon cancer   . Thyroid cancer   . Stroke   . Stroke     Past Surgical History  Procedure Date  . Colon resection 2007  . Thyroidectomy 2008  . Septoplasty     Family History  Problem Relation Age of Onset  . Stomach cancer Mother   . Heart failure Mother   . Pneumonia Father   . Breast cancer Mother   . Diabetes Paternal Uncle     History  Substance Use Topics  . Smoking status: Former Smoker    Quit date: 05/28/2005  . Smokeless tobacco: Never Used  . Alcohol Use: No       Review of Systems  All other systems reviewed and are negative.    Allergies  Review of patient's allergies indicates no known allergies.  Home Medications   Current Outpatient Rx  Name Route Sig Dispense Refill  . ACETAMINOPHEN 500 MG PO TABS Oral Take 500 mg by mouth every 6 (six) hours as needed. For pain    . CALCIUM CARBONATE 1250 MG PO CHEW Oral Chew 1 tablet by mouth daily.    Marland Kitchen CLOPIDOGREL BISULFATE 75 MG PO TABS Oral Take 75 mg by mouth daily.    Marland Kitchen FLUTICASONE-SALMETEROL 250-50 MCG/DOSE IN AEPB Inhalation Inhale 1 puff into the lungs every 12 (twelve) hours.    Effie Berkshire IN Inhalation Inhale 2 puffs into the lungs 3 (three) times daily.     Marland Kitchen LEVOTHYROXINE SODIUM 125 MCG PO TABS Oral Take 125 mcg by mouth daily.    Marland Kitchen LOSARTAN POTASSIUM-HCTZ 100-25 MG PO TABS Oral Take 1 tablet by mouth daily.    Marland Kitchen MIRTAZAPINE 15 MG PO TABS Oral Take 15 mg by mouth at bedtime.    Marland Kitchen NIACIN ER (ANTIHYPERLIPIDEMIC) 500 MG PO TBCR Oral Take 500 mg by mouth at bedtime.    . OMEPRAZOLE 20 MG PO CPDR  Oral Take 20 mg by mouth daily.    Marland Kitchen ROSUVASTATIN CALCIUM 20 MG PO TABS Oral Take 20 mg by mouth daily.    Marland Kitchen SITAGLIPTIN PHOSPHATE 100 MG PO TABS Oral Take 50 mg by mouth daily.     Marland Kitchen VITAMIN D (ERGOCALCIFEROL) 50000 UNITS PO CAPS Oral Take 50,000 Units by mouth every 7 (seven) days.      BP 105/52  Pulse 98  Temp 101.2 F (38.4 C) (Rectal)  Resp 27  Ht 6' (1.829 m)  Wt 193 lb (87.544 kg)  BMI 26.18 kg/m2  SpO2 90%  Physical Exam  Nursing note and vitals reviewed. Constitutional: He is oriented to person, place, and time. He appears well-developed and well-nourished.  HENT:  Head: Normocephalic and atraumatic.  Eyes: EOM are normal.  Neck: Normal range of motion.  Cardiovascular: Normal rate, regular rhythm, normal heart sounds and intact distal pulses.   Pulmonary/Chest: Effort normal and breath sounds normal. No respiratory distress.  Abdominal: Soft. He exhibits no  distension. There is no tenderness.  Musculoskeletal: Normal range of motion.  Neurological: He is alert and oriented to person, place, and time.  Skin: Skin is warm and dry.  Psychiatric: He has a normal mood and affect. Judgment normal.    ED Course  Procedures (including critical care time)   Date: 06/13/2012  Rate: 105  Rhythm: sinus tachycardia  QRS Axis: normal  Intervals: normal  ST/T Wave abnormalities: normal  Conduction Disutrbances: none  Narrative Interpretation:   Old EKG Reviewed: No significant changes noted      Labs Reviewed  CBC - Abnormal; Notable for the following:    WBC 11.2 (*)     All other components within normal limits  BASIC METABOLIC PANEL - Abnormal; Notable for the following:    Glucose, Bld 176 (*)     BUN 32 (*)     Creatinine, Ser 1.69 (*)     GFR calc non Af Amer 37 (*)     GFR calc Af Amer 43 (*)     All other components within normal limits  URINALYSIS, ROUTINE W REFLEX MICROSCOPIC - Abnormal; Notable for the following:    Ketones, ur TRACE (*)     All other components within normal limits  TROPONIN I  LACTIC ACID, PLASMA  CULTURE, BLOOD (ROUTINE X 2)  CULTURE, BLOOD (ROUTINE X 2)  URINE CULTURE   Dg Chest 2 View  06/13/2012  *RADIOLOGY REPORT*  Clinical Data: Chest tightness and shortness of breath; cough and fever.  Weakness.  History of diabetes.  CHEST - 2 VIEW  Comparison: CT of the chest performed 01/16/2011  Findings: The lungs are well-aerated.  Mild right basilar airspace opacity raises concern for pneumonia.  Scattered small bilateral pulmonary nodules are better characterized on the prior CT.  No definite pleural effusion or pneumothorax is seen.  The heart is normal in size; the mediastinal contour is within normal limits.  No acute osseous abnormalities are seen.  IMPRESSION:  1.  Mild right basilar airspace opacity raises concern for pneumonia. 2.  Scattered small bilateral pulmonary nodules again noted, better  characterized on the prior CT.  Original Report Authenticated By: Tonia Ghent, M.D.    I personally reviewed the imaging tests through PACS system  I reviewed available ER/hospitalization records thought the EMR   No diagnosis found. 1. community-acquired pneumonia   MDM  Patient presents with an oral temperature of 101.6.  He has evidence of right lower lobe pneumonia with  new oxygen requirement given his presenting her to was 86% on room air.  He is now 94% on 2 L.  Does better after breathing treatments and oxygen        Lyanne Co, MD 06/13/12 [id number]

## 2012-06-13 NOTE — ED Notes (Signed)
Per EMS: Pt with weakness, nausea and vomiting x6, and near syncopal episode without fall. Pt has been "working in the yard for three days, and maybe not hydrating well." Pt with emphysema hx, received 5mg  of albuterol nebulized, and 4mg  of zofran IV. Has PIV in left A/C. Not O2 dependent at home. NSR on monitor and EMS EKG. Denies chest pain, reports some chest pressure

## 2012-06-13 NOTE — ED Notes (Signed)
MD at bedside. 

## 2012-06-13 NOTE — H&P (Signed)
David [name] is an 78 y.o. male.    Pcp: Dr. Waynard Edwards  Chief Complaint: weakness and sob HPI: 78 yo male with hx of copd, c/o fever, chills, sob, cough (dry), wheezing, slight chest discomfort  (resolved), without radiation, associated with cough.  Slight n/v of food, mostly dry heaves though.  Pt denies palp, diarrhea, brbpr, black stool.    Pt was on the comode in bathroom, and was unable to get off comode due to weakness, and sob.  In Ed. Pt found to have pneumonia  Pox 88%.  Pt will be admitted for cap.   Past Medical History  Diagnosis Date  . COPD (chronic obstructive pulmonary disease)   . Hypertension   . Gastroesophageal reflux disease   . Hyperlipidemia   . AAA (abdominal aortic aneurysm)   . Thyroid disease   . Lung nodules   . Diabetes mellitus   . ED (erectile dysfunction)   . BPH (benign prostatic hyperplasia)   . CKD (chronic kidney disease), stage III   . Positional vertigo   . Osteopenia   . AAA (abdominal aortic aneurysm)   . Vitamin d deficiency   . Atherosclerosis     pt denies this  . Peripheral vascular disease   . Colon cancer   . Thyroid cancer   . Stroke   . Stroke     Past Surgical History  Procedure Date  . Colon resection 2007  . Thyroidectomy 2008  . Septoplasty     Family History  Problem Relation Age of Onset  . Stomach cancer Mother   . Heart failure Mother   . Pneumonia Father   . Breast cancer Mother   . Diabetes Paternal Uncle    Social History:  reports that he quit smoking about 7 years ago. His smoking use included Cigarettes. He has a 75 pack-year smoking history. He has never used smokeless tobacco. He reports that he does not drink alcohol or use illicit drugs.  Allergies: No Known Allergies   (Not in a hospital admission)  Results for orders placed during the hospital encounter of 06/13/12 (from the past 48 hour(s))  CBC     Status: Abnormal   Collection Time   06/13/12  9:20 PM      Component Value Range  Comment   WBC 11.2 (*) 4.0 - 10.5 K/uL    RBC 4.55  4.22 - 5.81 MIL/uL    Hemoglobin 14.2  13.0 - 17.0 g/dL    HCT 81.1  91.4 - 78.2 %    MCV 93.8  78.0 - 100.0 fL    MCH 31.2  26.0 - 34.0 pg    MCHC 33.3  30.0 - 36.0 g/dL    RDW 95.6  21.3 - 08.6 %    Platelets 153  150 - 400 K/uL   BASIC METABOLIC PANEL     Status: Abnormal   Collection Time   06/13/12  9:20 PM      Component Value Range Comment   Sodium 138  135 - 145 mEq/L    Potassium 4.1  3.5 - 5.1 mEq/L    Chloride 104  96 - 112 mEq/L    CO2 22  19 - 32 mEq/L    Glucose, Bld 176 (*) 70 - 99 mg/dL    BUN 32 (*) 6 - 23 mg/dL    Creatinine, Ser 5.78 (*) 0.50 - 1.35 mg/dL    Calcium 9.0  8.4 - 46.9 mg/dL    GFR calc non  Af Amer 37 (*) >90 mL/min    GFR calc Af Amer 43 (*) >90 mL/min   TROPONIN I     Status: Normal   Collection Time   06/13/12  9:20 PM      Component Value Range Comment   Troponin I <0.30  <0.30 ng/mL   URINALYSIS, ROUTINE W REFLEX MICROSCOPIC     Status: Abnormal   Collection Time   06/13/12 10:00 PM      Component Value Range Comment   Color, Urine YELLOW  YELLOW    APPearance CLEAR  CLEAR    Specific Gravity, Urine 1.021  1.005 - 1.030    pH 5.0  5.0 - 8.0    Glucose, UA NEGATIVE  NEGATIVE mg/dL    Hgb urine dipstick NEGATIVE  NEGATIVE    Bilirubin Urine NEGATIVE  NEGATIVE    Ketones, ur TRACE (*) NEGATIVE mg/dL    Protein, ur NEGATIVE  NEGATIVE mg/dL    Urobilinogen, UA 0.2  0.0 - 1.0 mg/dL    Nitrite NEGATIVE  NEGATIVE    Leukocytes, UA NEGATIVE  NEGATIVE MICROSCOPIC NOT DONE ON URINES WITH NEGATIVE PROTEIN, BLOOD, LEUKOCYTES, NITRITE, OR GLUCOSE <1000 mg/dL.  LACTIC ACID, PLASMA     Status: Normal   Collection Time   06/13/12 10:06 PM      Component Value Range Comment   Lactic Acid, Venous 2.0  0.5 - 2.2 mmol/L    Dg Chest 2 View  06/13/2012  *RADIOLOGY REPORT*  Clinical Data: Chest tightness and shortness of breath; cough and fever.  Weakness.  History of diabetes.  CHEST - 2 VIEW   Comparison: CT of the chest performed 01/16/2011  Findings: The lungs are well-aerated.  Mild right basilar airspace opacity raises concern for pneumonia.  Scattered small bilateral pulmonary nodules are better characterized on the prior CT.  No definite pleural effusion or pneumothorax is seen.  The heart is normal in size; the mediastinal contour is within normal limits.  No acute osseous abnormalities are seen.  IMPRESSION:  1.  Mild right basilar airspace opacity raises concern for pneumonia. 2.  Scattered small bilateral pulmonary nodules again noted, better characterized on the prior CT.  Original Report Authenticated By: Tonia Ghent, M.D.    Review of Systems  Constitutional: Positive for fever and chills. Negative for weight loss, malaise/fatigue and diaphoresis.  HENT: Negative for hearing loss, ear pain, nosebleeds, congestion, sore throat, neck pain, tinnitus and ear discharge.   Eyes: Negative for blurred vision, double vision, photophobia, pain, discharge and redness.  Respiratory: Positive for cough, shortness of breath and wheezing. Negative for hemoptysis, sputum production and stridor.   Cardiovascular: Positive for chest pain. Negative for palpitations, orthopnea, claudication, leg swelling and PND.  Gastrointestinal: Positive for nausea and vomiting. Negative for heartburn, abdominal pain, diarrhea, constipation, blood in stool and melena.  Genitourinary: Negative for dysuria, urgency, frequency, hematuria and flank pain.  Musculoskeletal: Positive for joint pain. Negative for myalgias, back pain and falls.  Skin: Negative for itching and rash.  Neurological: Negative for dizziness, tingling, tremors, sensory change, speech change, focal weakness, seizures, loss of consciousness, weakness and headaches.  Endo/Heme/Allergies: Negative for environmental allergies and polydipsia. Does not bruise/bleed easily.  Psychiatric/Behavioral: Negative for depression, suicidal ideas,  hallucinations, memory loss and substance abuse. The patient is not nervous/anxious and does not have insomnia.     Blood pressure 101/48, pulse 94, temperature 101.2 F (38.4 C), temperature source Rectal, resp. rate 20, height 6' (1.829 m), weight 87.544  kg (193 lb), SpO2 92.00%. Physical Exam  Constitutional: He is oriented to person, place, and time. He appears well-developed and well-nourished. No distress.  HENT:  Head: Normocephalic and atraumatic.  Nose: Nose normal.  Mouth/Throat: Oropharynx is clear and moist. No oropharyngeal exudate.  Eyes: Conjunctivae and EOM are normal. Pupils are equal, round, and reactive to light. Right eye exhibits no discharge. Left eye exhibits no discharge. No scleral icterus.  Neck: Normal range of motion. Neck supple. No JVD present. No tracheal deviation present. No thyromegaly present.  Cardiovascular: Normal rate, regular rhythm and normal heart sounds.  Exam reveals no gallop and no friction rub.   No murmur heard. Respiratory: Effort normal. No stridor. No respiratory distress. He has no wheezes. He has rales. He exhibits no tenderness.  GI: Soft. Bowel sounds are normal. He exhibits no distension and no mass. There is no tenderness. There is no rebound and no guarding.  Musculoskeletal: Normal range of motion. He exhibits no edema and no tenderness.  Lymphadenopathy:    He has no cervical adenopathy.  Neurological: He is alert and oriented to person, place, and time. He has normal reflexes. He displays normal reflexes. No cranial nerve deficit. He exhibits normal muscle tone. Coordination normal.  Skin: Skin is warm and dry. No rash noted. He is not diaphoretic. No erythema. No pallor.  Psychiatric: He has a normal mood and affect. His behavior is normal. Judgment and thought content normal.     Assessment/Plan CAP:  tx with rocephin, zithromax Blood cx x2 sets pending  Copd (not on home o2) Albuterol, atrovent Cont Advair for now  Dm2:   Cont januvia,  fsbs ac and qhs and iss  Hypertension: stable,cont hyzaar  Hypothyroidism:  Cont synthroid  Pearson Grippe 06/13/2012, 11:33 PM

## 2012-06-14 ENCOUNTER — Encounter (HOSPITAL_COMMUNITY): Payer: Self-pay

## 2012-06-14 ENCOUNTER — Inpatient Hospital Stay (HOSPITAL_COMMUNITY): Payer: Medicare Other

## 2012-06-14 DIAGNOSIS — E785 Hyperlipidemia, unspecified: Secondary | ICD-10-CM | POA: Diagnosis present

## 2012-06-14 DIAGNOSIS — J438 Other emphysema: Secondary | ICD-10-CM

## 2012-06-14 DIAGNOSIS — I10 Essential (primary) hypertension: Secondary | ICD-10-CM

## 2012-06-14 DIAGNOSIS — D696 Thrombocytopenia, unspecified: Secondary | ICD-10-CM

## 2012-06-14 DIAGNOSIS — I359 Nonrheumatic aortic valve disorder, unspecified: Secondary | ICD-10-CM

## 2012-06-14 DIAGNOSIS — E119 Type 2 diabetes mellitus without complications: Secondary | ICD-10-CM | POA: Diagnosis present

## 2012-06-14 LAB — MRSA PCR SCREENING: MRSA by PCR: NEGATIVE

## 2012-06-14 LAB — CARDIAC PANEL(CRET KIN+CKTOT+MB+TROPI)
CK, MB: 4.2 ng/mL — ABNORMAL HIGH (ref 0.3–4.0)
Relative Index: 2.3 (ref 0.0–2.5)
Total CK: 120 U/L (ref 7–232)
Total CK: 182 U/L (ref 7–232)
Total CK: 181 U/L (ref 7–232)
Troponin I: <0.3 ng/mL (ref <0.30)

## 2012-06-14 LAB — LIPID PANEL
LDL Cholesterol: 45 mg/dL (ref 0–99)
Total CHOL/HDL Ratio: 2.7 RATIO
VLDL: 13 mg/dL (ref 0–40)

## 2012-06-14 LAB — COMPREHENSIVE METABOLIC PANEL
Alkaline Phosphatase: 63 U/L (ref 39–117)
BUN: 31 mg/dL — ABNORMAL HIGH (ref 6–23)
CO2: 22 mEq/L (ref 19–32)
Calcium: 8.3 mg/dL — ABNORMAL LOW (ref 8.4–10.5)
GFR calc Af Amer: 42 mL/min — ABNORMAL LOW (ref >90)
GFR calc non Af Amer: 36 mL/min — ABNORMAL LOW (ref >90)
Glucose, Bld: 133 mg/dL — ABNORMAL HIGH (ref 70–99)
Potassium: 4.1 mEq/L (ref 3.5–5.1)
Total Protein: 6.6 g/dL (ref 6.0–8.3)

## 2012-06-14 LAB — EXPECTORATED SPUTUM ASSESSMENT W GRAM STAIN, RFLX TO RESP C

## 2012-06-14 LAB — GLUCOSE, CAPILLARY: Glucose-Capillary: 128 mg/dL — ABNORMAL HIGH (ref 70–99)

## 2012-06-14 LAB — URINE CULTURE
Colony Count: 1000
Culture  Setup Time: 201306220200

## 2012-06-14 LAB — HEMOGLOBIN A1C
Hgb A1c MFr Bld: 6.6 % — ABNORMAL HIGH (ref <5.7)
Mean Plasma Glucose: 143 mg/dL — ABNORMAL HIGH (ref <117)

## 2012-06-14 MED ORDER — CLOPIDOGREL BISULFATE 75 MG PO TABS
75.0000 mg | ORAL_TABLET | Freq: Every day | ORAL | Status: DC
  Administered 2012-06-14 – 2012-06-15 (×2): 75 mg via ORAL
  Filled 2012-06-14 (×3): qty 1

## 2012-06-14 MED ORDER — HEPARIN SODIUM (PORCINE) 5000 UNIT/ML IJ SOLN
5000.0000 [IU] | Freq: Three times a day (TID) | INTRAMUSCULAR | Status: DC
  Administered 2012-06-14 – 2012-06-15 (×3): 5000 [IU] via SUBCUTANEOUS
  Filled 2012-06-14 (×6): qty 1

## 2012-06-14 MED ORDER — PANTOPRAZOLE SODIUM 40 MG PO TBEC
40.0000 mg | DELAYED_RELEASE_TABLET | Freq: Every day | ORAL | Status: DC
  Administered 2012-06-14 – 2012-06-17 (×4): 40 mg via ORAL
  Filled 2012-06-14 (×4): qty 1

## 2012-06-14 MED ORDER — HYDROCHLOROTHIAZIDE 25 MG PO TABS
25.0000 mg | ORAL_TABLET | Freq: Every day | ORAL | Status: DC
Start: 2012-06-14 — End: 2012-06-14
  Filled 2012-06-14: qty 1

## 2012-06-14 MED ORDER — DEXTROSE 5 % IV SOLN: 1.0000 g | Freq: Once | INTRAVENOUS | Status: DC

## 2012-06-14 MED ORDER — ACETAMINOPHEN 500 MG PO TABS
500.0000 mg | ORAL_TABLET | Freq: Four times a day (QID) | ORAL | Status: DC | PRN
  Administered 2012-06-15 (×2): 500 mg via ORAL
  Filled 2012-06-14: qty 2
  Filled 2012-06-14: qty 1

## 2012-06-14 MED ORDER — NIACIN ER (ANTIHYPERLIPIDEMIC) 500 MG PO TBCR
500.0000 mg | EXTENDED_RELEASE_TABLET | Freq: Every day | ORAL | Status: DC
  Administered 2012-06-14 – 2012-06-16 (×3): 500 mg via ORAL
  Filled 2012-06-14 (×4): qty 1

## 2012-06-14 MED ORDER — LINAGLIPTIN 5 MG PO TABS
5.0000 mg | ORAL_TABLET | Freq: Every day | ORAL | Status: DC
  Administered 2012-06-14 – 2012-06-17 (×4): 5 mg via ORAL
  Filled 2012-06-14 (×4): qty 1

## 2012-06-14 MED ORDER — INSULIN ASPART 100 UNIT/ML ~~LOC~~ SOLN
0.0000 [IU] | Freq: Three times a day (TID) | SUBCUTANEOUS | Status: DC
  Administered 2012-06-14 (×2): 2 [IU] via SUBCUTANEOUS
  Administered 2012-06-15 (×3): 1 [IU] via SUBCUTANEOUS

## 2012-06-14 MED ORDER — FUROSEMIDE 10 MG/ML IJ SOLN
20.0000 mg | Freq: Once | INTRAMUSCULAR | Status: AC
  Administered 2012-06-14: 20 mg via INTRAVENOUS
  Filled 2012-06-14: qty 2

## 2012-06-14 MED ORDER — ALBUTEROL SULFATE (5 MG/ML) 0.5% IN NEBU
2.5000 mg | INHALATION_SOLUTION | RESPIRATORY_TRACT | Status: DC | PRN
Start: 2012-06-14 — End: 2012-06-17
  Administered 2012-06-14 – 2012-06-16 (×3): 2.5 mg via RESPIRATORY_TRACT
  Filled 2012-06-14 (×3): qty 0.5

## 2012-06-14 MED ORDER — LOSARTAN POTASSIUM-HCTZ 100-25 MG PO TABS: 1.0000 | ORAL_TABLET | Freq: Every day | ORAL | Status: DC

## 2012-06-14 MED ORDER — IPRATROPIUM-ALBUTEROL 18-103 MCG/ACT IN AERO
2.0000 | INHALATION_SPRAY | RESPIRATORY_TRACT | Status: DC | PRN
  Administered 2012-06-14 – 2012-06-17 (×5): 2 via RESPIRATORY_TRACT
  Filled 2012-06-14 (×2): qty 14.7

## 2012-06-14 MED ORDER — SODIUM CHLORIDE 0.9 % IV SOLN
INTRAVENOUS | Status: AC
  Administered 2012-06-14: 11:00:00 via INTRAVENOUS

## 2012-06-14 MED ORDER — LEVOTHYROXINE SODIUM 125 MCG PO TABS
125.0000 ug | ORAL_TABLET | Freq: Every day | ORAL | Status: DC
  Administered 2012-06-14 – 2012-06-17 (×4): 125 ug via ORAL
  Filled 2012-06-14 (×5): qty 1

## 2012-06-14 MED ORDER — MIRTAZAPINE 15 MG PO TABS
15.0000 mg | ORAL_TABLET | Freq: Every day | ORAL | Status: DC
  Administered 2012-06-14 – 2012-06-16 (×3): 15 mg via ORAL
  Filled 2012-06-14 (×4): qty 1

## 2012-06-14 MED ORDER — DEXTROSE 5 % IV SOLN
1.0000 g | INTRAVENOUS | Status: DC
  Administered 2012-06-15 – 2012-06-16 (×2): 1 g via INTRAVENOUS
  Filled 2012-06-14 (×2): qty 10

## 2012-06-14 MED ORDER — SODIUM CHLORIDE 0.9 % IJ SOLN
INTRAMUSCULAR | Status: AC
  Filled 2012-06-14: qty 3

## 2012-06-14 MED ORDER — FLUTICASONE-SALMETEROL 250-50 MCG/DOSE IN AEPB
1.0000 | INHALATION_SPRAY | Freq: Two times a day (BID) | RESPIRATORY_TRACT | Status: DC
  Administered 2012-06-14 – 2012-06-17 (×7): 1 via RESPIRATORY_TRACT
  Filled 2012-06-14: qty 14

## 2012-06-14 MED ORDER — INSULIN ASPART 100 UNIT/ML ~~LOC~~ SOLN: 0.0000 [IU] | Freq: Every day | SUBCUTANEOUS | Status: DC

## 2012-06-14 MED ORDER — VITAMIN D (ERGOCALCIFEROL) 1.25 MG (50000 UNIT) PO CAPS
50000.0000 [IU] | ORAL_CAPSULE | ORAL | Status: DC
  Administered 2012-06-16: 50000 [IU] via ORAL
  Filled 2012-06-14: qty 1

## 2012-06-14 MED ORDER — SODIUM CHLORIDE 0.9 % IV BOLUS (SEPSIS)
1000.0000 mL | Freq: Once | INTRAVENOUS | Status: AC
  Administered 2012-06-14: 1000 mL via INTRAVENOUS

## 2012-06-14 MED ORDER — CALCIUM CARBONATE 1250 (500 CA) MG PO CHEW: 1250.0000 mg | CHEWABLE_TABLET | Freq: Every day | ORAL | Status: DC

## 2012-06-14 MED ORDER — LOSARTAN POTASSIUM 50 MG PO TABS
100.0000 mg | ORAL_TABLET | Freq: Every day | ORAL | Status: DC
  Filled 2012-06-14: qty 2

## 2012-06-14 MED ORDER — DEXTROSE 5 % IV SOLN
500.0000 mg | INTRAVENOUS | Status: DC
  Administered 2012-06-15 – 2012-06-16 (×2): 500 mg via INTRAVENOUS
  Filled 2012-06-14 (×2): qty 500

## 2012-06-14 MED ORDER — SODIUM CHLORIDE 0.9 % IJ SOLN
3.0000 mL | Freq: Two times a day (BID) | INTRAMUSCULAR | Status: DC
  Administered 2012-06-14 – 2012-06-17 (×6): 3 mL via INTRAVENOUS

## 2012-06-14 MED ORDER — CALCIUM CARBONATE 1250 (500 CA) MG PO TABS
1.0000 | ORAL_TABLET | Freq: Every day | ORAL | Status: DC
  Administered 2012-06-14 – 2012-06-17 (×4): 500 mg via ORAL
  Filled 2012-06-14 (×4): qty 1

## 2012-06-14 MED ORDER — ATORVASTATIN CALCIUM 20 MG PO TABS
20.0000 mg | ORAL_TABLET | Freq: Every day | ORAL | Status: DC
  Administered 2012-06-14 – 2012-06-16 (×3): 20 mg via ORAL
  Filled 2012-06-14 (×4): qty 1

## 2012-06-14 NOTE — Progress Notes (Signed)
[facility] hospitalist progress note. Chief complaint. Dyspnea. History of present illness 78 year old male admitted with suspected pneumonia. During the workup in the emergency room the patient was noted to be somewhat hypotensive with systolic blood pressure in the 80s. Patient was given 2 L of IV fluids per bolus. Nursing at that point noted increased dyspnea and tachypnea. The patient to was transitioned to a step down unit rather than telemetry bed for this reason. The patient was seen at the bedside and though able to talk in full sentences without dyspnea was noted the somewhat tachypneic with a respiratory rate in the 30-40 range. He has a known history of COPD. He does not use home oxygen. He does use a home meter dose inhaler for his COPD. The patient was given when necessary albuterol nebulizer with significant improvement in his dyspnea and tachypnea. A portable chest x-ray was obtained and this suggested vascular congestion and bibasilar airspace opacities that may reflect mild interstitial edema new from prior study. Vital signs. Temperature 98.3, pulse 88, respirations 25, blood pressure 101/63. O2 sats 95% on nasal cannula oxygen. General appearance. Well-developed elderly male in no distress. Alert, cooperative, oriented. Cardiac. Somewhat distant heart sounds with a regular rate and rhythm. There is some jugular venous distention. No peripheral or sacral edema seen. Lungs. Crackles are appreciated in the bases bilaterally. Tachypnea but no distress. Patient does have a cough. Abdomen. Soft with positive bowel sounds. No pain. Impression/plan. Problem #1 community-acquired pneumonia with probable underlying CHF. The patient is already ordered a 2-D echocardiogram. Given the chest x-ray results I will gently diuresis with 20 mg of Lasix. Blood pressure will need to be followed closely given prior hypotension noted per emergency room evaluation. I will add a been x-ray peptide to a.m. labs.

## 2012-06-14 NOTE — Progress Notes (Signed)
  Echocardiogram 2D Echocardiogram has been performed.  David [name] 06/14/2012, 8:47 AM

## 2012-06-14 NOTE — Progress Notes (Signed)
Pt received in 1440. Came up to floor in wheelchair pushed by nurse tech accompanied by family members. Alert and oriented and in no distress at this time. Denies any needs at this moment. Sitting up in room in recliner watching television. Fam,ily remains at bedside.

## 2012-06-14 NOTE — Progress Notes (Addendum)
[facility]                                                                                Patient Demographics  David [name], is a 78 y.o. male  GEX:[id number]  [id number]  DOB - 1931/08/16  Admit date - 06/13/2012  Admitting Physician Conley Canal, MD  Outpatient Primary MD for the patient is Ezequiel Kayser, MD  LOS - 1   Chief Complaint  Patient presents with  . Nausea  . Near Syncope        Subjective:   David [name] today has, No headache, No chest pain, No abdominal pain - No Nausea, No new weakness tingling or numbness, No Cough - improved shortness of breath.  Objective:   Filed Vitals:   06/14/12 0506 06/14/12 0600 06/14/12 0700 06/14/12 0739  BP:  108/54 106/54   Pulse: 88 83 81   Temp:    99.7 F (37.6 C)  TempSrc:      Resp: 25 27 22    Height:      Weight:      SpO2: 95% 96% 95%     Wt Readings from Last 3 Encounters:  06/14/12 87.9 kg (193 lb 12.6 oz)  05/28/12 87.998 kg (194 lb)  05/14/12 87.998 kg (194 lb)     Intake/Output Summary (Last 24 hours) at 06/14/12 0844 Last data filed at 06/14/12 0841  Gross per 24 hour  Intake   3002 ml  Output   1200 ml  Net   1802 ml    Exam Awake Alert, Oriented *3, No new F.N deficits, Normal affect .AT,PERRAL Supple Neck,No JVD, No cervical lymphadenopathy appriciated.  Symmetrical Chest wall movement, Good air movement bilaterally, coarse bilateral breath sounds RRR,No Gallops,Rubs or new Murmurs, No Parasternal Heave +ve B.Sounds, Abd Soft, Non tender, No organomegaly appriciated, No rebound -guarding or rigidity. No Cyanosis, Clubbing or edema, No new Rash or bruise    Data Review  CBC  Lab 06/13/12 2120  WBC 11.2*  HGB 14.2  HCT 42.7  PLT 153  MCV 93.8  MCH 31.2  MCHC 33.3  RDW 14.3  LYMPHSABS --  MONOABS --  EOSABS --  BASOSABS --  BANDABS --    Chemistries   Lab 06/14/12 0725 06/13/12 2120  NA 138 138  K 4.1 4.1  CL 105 104  CO2 22 22    GLUCOSE 133* 176*  BUN 31* 32*  CREATININE 1.73* 1.69*  CALCIUM 8.3* 9.0  MG -- --  AST 18 --  ALT 15 --  ALKPHOS 63 --  BILITOT 0.7 --   ------------------------------------------------------------------------------------------------------------------ estimated creatinine clearance is 38.6 ml/min (by C-G formula based on Cr of 1.73). ------------------------------------------------------------------------------------------------------------------ No results found for this basename: HGBA1C:2 in the last 72 hours ------------------------------------------------------------------------------------------------------------------ No results found for this basename: CHOL:2,HDL:2,LDLCALC:2,TRIG:2,CHOLHDL:2,LDLDIRECT:2 in the last 72 hours ------------------------------------------------------------------------------------------------------------------ No results found for this basename: TSH,T4TOTAL,FREET3,T3FREE,THYROIDAB in the last 72 hours ------------------------------------------------------------------------------------------------------------------ No results found for this basename: VITAMINB12:2,FOLATE:2,FERRITIN:2,TIBC:2,IRON:2,RETICCTPCT:2 in the last 72 hours  Coagulation profile No results found for this basename: INR:5,PROTIME:5 in the last 168 hours  No results found for this basename: DDIMER:2 in the last 72 hours  Cardiac Enzymes  Lab  06/14/12 0725 06/14/12 0010 06/13/12 2120  CKMB 4.2* 2.4 --  TROPONINI <0.30 <0.30 <0.30  MYOGLOBIN -- -- --   ------------------------------------------------------------------------------------------------------------------ No components found with this basename: POCBNP:3  Micro Results Recent Results (from the past 240 hour(s))  MRSA PCR SCREENING     Status: Normal   Collection Time   06/14/12  4:30 AM      Component Value Range Status Comment   MRSA by PCR NEGATIVE  NEGATIVE Final     Radiology Reports Dg Chest 2  View  06/13/2012  *RADIOLOGY REPORT*  Clinical Data: Chest tightness and shortness of breath; cough and fever.  Weakness.  History of diabetes.  CHEST - 2 VIEW  Comparison: CT of the chest performed 01/16/2011  Findings: The lungs are well-aerated.  Mild right basilar airspace opacity raises concern for pneumonia.  Scattered small bilateral pulmonary nodules are better characterized on the prior CT.  No definite pleural effusion or pneumothorax is seen.  The heart is normal in size; the mediastinal contour is within normal limits.  No acute osseous abnormalities are seen.  IMPRESSION:  1.  Mild right basilar airspace opacity raises concern for pneumonia. 2.  Scattered small bilateral pulmonary nodules again noted, better characterized on the prior CT.  Original Report Authenticated By: Tonia Ghent, M.D.   Dg Chest Port 1 View  06/14/2012  *RADIOLOGY REPORT*  Clinical Data: Worsening dyspnea; history of diabetes.  PORTABLE CHEST - 1 VIEW  Comparison: Chest radiograph performed 06/13/2012  Findings: The lungs are well-aerated.  Vascular congestion is noted, with bibasilar airspace opacities; this may reflect interstitial edema, though pneumonia could conceivably have a similar appearance.  There is no evidence of pleural effusion or pneumothorax.  The cardiomediastinal silhouette is within normal limits.  No acute osseous abnormalities are seen.  IMPRESSION: Vascular congestion, with bibasilar airspace opacities; this may reflect mild interstitial edema, new from the prior study, though pneumonia again could have a similar appearance.  Original Report Authenticated By: Tonia Ghent, M.D.    Scheduled Meds:    . acetaminophen  1,000 mg Oral Once  . albuterol      . aspirin      . atorvastatin  20 mg Oral q1800  . azithromycin  500 mg Intravenous Q24H  . calcium carbonate  1 tablet Oral Daily  . cefTRIAXone (ROCEPHIN)  IV  1 g Intravenous Once  . cefTRIAXone (ROCEPHIN)  IV  1 g Intravenous Q24H  .  clopidogrel  75 mg Oral Q breakfast  . Fluticasone-Salmeterol  1 puff Inhalation Q12H  . furosemide  20 mg Intravenous Once  . insulin aspart  0-5 Units Subcutaneous QHS  . insulin aspart  0-9 Units Subcutaneous TID WC  . levothyroxine  125 mcg Oral QAC breakfast  . linagliptin  5 mg Oral Daily  . mirtazapine  15 mg Oral QHS  . niacin  500 mg Oral QHS  . ondansetron      . pantoprazole  40 mg Oral Q1200  . sodium chloride  1,000 mL Intravenous Once  . sodium chloride  1,000 mL Intravenous Once  . sodium chloride  3 mL Intravenous Q12H  . sodium chloride      . Vitamin D (Ergocalciferol)  50,000 Units Oral Q7 days  . DISCONTD: aspirin  325 mg Oral STAT  . DISCONTD: azithromycin  500 mg Intravenous Once  . DISCONTD: calcium carbonate  1,250 mg Oral Daily  . DISCONTD: cefTRIAXone (ROCEPHIN)  IV  1 g Intravenous Once  .  DISCONTD: hydrochlorothiazide  25 mg Oral Daily  . DISCONTD: losartan  100 mg Oral Daily  . DISCONTD: losartan-hydrochlorothiazide  1 tablet Oral Daily   Continuous Infusions:    . sodium chloride     PRN Meds:.acetaminophen, albuterol, albuterol-ipratropium  Assessment & Plan    1. Acute respiratory failure secondary to committee for pneumonia in a patient with history of COPD- we'll continue nebulizer treatments, patient does not have any wheezing on exam, continue empiric antibiotics, follow cultures, oxygen supplementation as needed, 2-D echo being obtained to rule out any element of CHF, clinically appears less likely, patient is hypotensive at this time so we'll closely monitor, will avoid any further Lasix.   2. Hypertension- blood pressure slightly on the lower side also patient appears to be in mild acute renal insufficiency, his ARB and diuretic will be held, gentle IV fluids and monitor.   3. Diabetes by this type II- agree with continuing home medications along with sliding scale CBGs will be monitored q. a.c. at bedtime.  CBG (last 3)   Basename  06/14/12 0723  GLUCAP 144*    4. History of CVA- no acute issues continue home dose aspirin Plavix along with niacin.   5. Dyslipidemia. Niacin to be continued outpatient.   6. History of colon and thyroid cancer. No acute issues outpatient followup with PCP and oncologist as before.    DVT Prophylaxis  Heparin   Procedures  Echo      Leroy Sea M.D on 06/14/2012 at 8:44 AM  Between 7am to 7pm - Pager - [phone]  After 7pm go to [email or web address] - password [email or web address]  And look for the night coverage person covering for me after hours  [facility] Office  [phone]

## 2012-06-15 ENCOUNTER — Inpatient Hospital Stay (HOSPITAL_COMMUNITY): Payer: Medicare Other

## 2012-06-15 DIAGNOSIS — D696 Thrombocytopenia, unspecified: Secondary | ICD-10-CM

## 2012-06-15 DIAGNOSIS — I10 Essential (primary) hypertension: Secondary | ICD-10-CM

## 2012-06-15 DIAGNOSIS — J438 Other emphysema: Secondary | ICD-10-CM

## 2012-06-15 LAB — CBC
HCT: 36.7 % — ABNORMAL LOW (ref 39.0–52.0)
Hemoglobin: 12.3 g/dL — ABNORMAL LOW (ref 13.0–17.0)
MCH: 31.6 pg (ref 26.0–34.0)
MCHC: 33.5 g/dL (ref 30.0–36.0)
MCV: 94.3 fL (ref 78.0–100.0)
RDW: 14.7 % (ref 11.5–15.5)

## 2012-06-15 LAB — BASIC METABOLIC PANEL
BUN: 28 mg/dL — ABNORMAL HIGH (ref 6–23)
Calcium: 8.6 mg/dL (ref 8.4–10.5)
Creatinine, Ser: 1.78 mg/dL — ABNORMAL HIGH (ref 0.50–1.35)
GFR calc Af Amer: 40 mL/min — ABNORMAL LOW (ref >90)
GFR calc non Af Amer: 35 mL/min — ABNORMAL LOW (ref >90)
Glucose, Bld: 145 mg/dL — ABNORMAL HIGH (ref 70–99)
Potassium: 3.8 mEq/L (ref 3.5–5.1)

## 2012-06-15 LAB — GLUCOSE, CAPILLARY

## 2012-06-15 MED ORDER — POTASSIUM CHLORIDE CRYS ER 20 MEQ PO TBCR
20.0000 meq | EXTENDED_RELEASE_TABLET | Freq: Once | ORAL | Status: AC
  Administered 2012-06-15: 20 meq via ORAL
  Filled 2012-06-15: qty 1

## 2012-06-15 MED ORDER — CLOPIDOGREL BISULFATE 75 MG PO TABS
75.0000 mg | ORAL_TABLET | Freq: Every day | ORAL | Status: DC
  Filled 2012-06-15: qty 1

## 2012-06-15 MED ORDER — FLORA-Q PO CAPS
1.0000 | ORAL_CAPSULE | Freq: Every day | ORAL | Status: DC
  Administered 2012-06-15 – 2012-06-17 (×3): 1 via ORAL
  Filled 2012-06-15 (×3): qty 1

## 2012-06-15 MED ORDER — FUROSEMIDE 20 MG PO TABS
20.0000 mg | ORAL_TABLET | Freq: Once | ORAL | Status: AC
  Administered 2012-06-15: 20 mg via ORAL
  Filled 2012-06-15: qty 1

## 2012-06-15 NOTE — Progress Notes (Signed)
Pt doing well on room air 94%, ambulated in hallway x2 today. States he is feeling much better, "still weak", reassured.

## 2012-06-15 NOTE — Progress Notes (Signed)
[facility]                                                                                Patient Demographics  David [name], is a 78 y.o. male  ZOX:[id number]  [id number]  DOB - 1931-09-23  Admit date - 06/13/2012  Admitting Physician Conley Canal, MD  Outpatient Primary MD for the patient is Ezequiel Kayser, MD  LOS - 2   Chief Complaint  Patient presents with  . Nausea  . Near Syncope        Subjective:   David [name] today has, No headache, No chest pain, No abdominal pain - No Nausea, No new weakness tingling or numbness,+ productive Cough - improved shortness of breath.  Objective:   Filed Vitals:   06/14/12 2027 06/14/12 2036 06/15/12 0500 06/15/12 0929  BP: 121/58  105/55 122/65  Pulse: 79  82 78  Temp: 98.5 F (36.9 C)  98.6 F (37 C) 98.6 F (37 C)  TempSrc: Oral  Oral Oral  Resp: 20  18 20   Height:      Weight:   88.1 kg (194 lb 3.6 oz)   SpO2: 94% 91% 78% 96%    Wt Readings from Last 3 Encounters:  06/15/12 88.1 kg (194 lb 3.6 oz)  05/28/12 87.998 kg (194 lb)  05/14/12 87.998 kg (194 lb)     Intake/Output Summary (Last 24 hours) at 06/15/12 0931 Last data filed at 06/15/12 0840  Gross per 24 hour  Intake 1777.5 ml  Output   1101 ml  Net  676.5 ml    Exam Awake Alert, Oriented *3, No new F.N deficits, Normal affect [place].AT,PERRAL Supple Neck,No JVD, No cervical lymphadenopathy appriciated.  Symmetrical Chest wall movement, Good air movement bilaterally, coarse bilateral breath sounds few rales RRR,No Gallops,Rubs or new Murmurs, No Parasternal Heave +ve B.Sounds, Abd Soft, Non tender, No organomegaly appriciated, No rebound -guarding or rigidity. No Cyanosis, Clubbing or edema, No new Rash or bruise    Data Review  CBC  Lab 06/15/12 0500 06/13/12 2120  WBC 8.8 11.2*  HGB 12.3* 14.2  HCT 36.7* 42.7  PLT 137* 153  MCV 94.3 93.8  MCH 31.6 31.2  MCHC 33.5 33.3  RDW 14.7 14.3  LYMPHSABS -- --  MONOABS  -- --  EOSABS -- --  BASOSABS -- --  BANDABS -- --    Chemistries   Lab 06/15/12 0500 06/14/12 0725 06/13/12 2120  NA 140 138 138  K 3.8 4.1 4.1  CL 106 105 104  CO2 25 22 22   GLUCOSE 145* 133* 176*  BUN 28* 31* 32*  CREATININE 1.78* 1.73* 1.69*  CALCIUM 8.6 8.3* 9.0  MG 1.8 -- --  AST -- 18 --  ALT -- 15 --  ALKPHOS -- 63 --  BILITOT -- 0.7 --   ------------------------------------------------------------------------------------------------------------------ estimated creatinine clearance is 37.5 ml/min (by C-G formula based on Cr of 1.78). ------------------------------------------------------------------------------------------------------------------  [facility] 06/13/12 2120  HGBA1C 6.6*   ------------------------------------------------------------------------------------------------------------------  Basename 06/14/12 0725  CHOL 92  HDL 34*  LDLCALC 45  TRIG 66  CHOLHDL 2.7  LDLDIRECT --   ------------------------------------------------------------------------------------------------------------------ No results found for this basename: TSH,T4TOTAL,FREET3,T3FREE,THYROIDAB in the last  72 hours ------------------------------------------------------------------------------------------------------------------ No results found for this basename: VITAMINB12:2,FOLATE:2,FERRITIN:2,TIBC:2,IRON:2,RETICCTPCT:2 in the last 72 hours  Coagulation profile No results found for this basename: INR:5,PROTIME:5 in the last 168 hours  No results found for this basename: DDIMER:2 in the last 72 hours  Cardiac Enzymes  Lab 06/14/12 1144 06/14/12 0725 06/14/12 0010  CKMB 4.1* 4.2* 2.4  TROPONINI <0.30 <0.30 <0.30  MYOGLOBIN -- -- --   ------------------------------------------------------------------------------------------------------------------ No components found with this basename: POCBNP:3  Micro Results Recent Results (from the past 240 hour(s))  CULTURE, BLOOD  (ROUTINE X 2)     Status: Normal (Preliminary result)   Collection Time   06/13/12  9:59 PM      Component Value Range Status Comment   Specimen Description BLOOD RIGHT ARM   Final    Special Requests BOTTLES DRAWN AEROBIC AND ANAEROBIC   Final    Culture  Setup Time [id number]   Final    Culture     Final    Value:        BLOOD CULTURE RECEIVED NO GROWTH TO DATE CULTURE WILL BE HELD FOR 5 DAYS BEFORE ISSUING A FINAL NEGATIVE REPORT   Report Status PENDING   Incomplete   URINE CULTURE     Status: Normal   Collection Time   06/13/12 10:00 PM      Component Value Range Status Comment   Specimen Description URINE, CLEAN CATCH   Final    Special Requests NONE   Final    Culture  Setup Time [id number]   Final    Colony Count 1,000 COLONIES/ML   Final    Culture INSIGNIFICANT GROWTH   Final    Report Status 06/14/2012 FINAL   Final   CULTURE, BLOOD (ROUTINE X 2)     Status: Normal (Preliminary result)   Collection Time   06/13/12 10:06 PM      Component Value Range Status Comment   Specimen Description BLOOD RIGHT HAND   Final    Special Requests BOTTLES DRAWN AEROBIC AND ANAEROBIC   Final    Culture  Setup Time [id number]   Final    Culture     Final    Value:        BLOOD CULTURE RECEIVED NO GROWTH TO DATE CULTURE WILL BE HELD FOR 5 DAYS BEFORE ISSUING A FINAL NEGATIVE REPORT   Report Status PENDING   Incomplete   MRSA PCR SCREENING     Status: Normal   Collection Time   06/14/12  4:30 AM      Component Value Range Status Comment   MRSA by PCR NEGATIVE  NEGATIVE Final   CULTURE, EXPECTORATED SPUTUM-ASSESSMENT     Status: Normal   Collection Time   06/14/12 10:59 AM      Component Value Range Status Comment   Specimen Description SPUTUM   Final    Special Requests Normal   Final    Sputum evaluation     Final    Value: THIS SPECIMEN IS ACCEPTABLE. RESPIRATORY CULTURE REPORT TO FOLLOW.   Report Status 06/14/2012 FINAL   Final   CULTURE, RESPIRATORY     Status: Normal  (Preliminary result)   Collection Time   06/14/12 10:59 AM      Component Value Range Status Comment   Specimen Description SPUTUM   Final    Special Requests NONE   Final    Gram Stain PENDING   Incomplete    Culture Culture reincubated for better growth  Final    Report Status PENDING   Incomplete     Radiology Reports Dg Chest 2 View  06/13/2012  *RADIOLOGY REPORT*  Clinical Data: Chest tightness and shortness of breath; cough and fever.  Weakness.  History of diabetes.  CHEST - 2 VIEW  Comparison: CT of the chest performed 01/16/2011  Findings: The lungs are well-aerated.  Mild right basilar airspace opacity raises concern for pneumonia.  Scattered small bilateral pulmonary nodules are better characterized on the prior CT.  No definite pleural effusion or pneumothorax is seen.  The heart is normal in size; the mediastinal contour is within normal limits.  No acute osseous abnormalities are seen.  IMPRESSION:  1.  Mild right basilar airspace opacity raises concern for pneumonia. 2.  Scattered small bilateral pulmonary nodules again noted, better characterized on the prior CT.  Original Report Authenticated By: Tonia Ghent, M.D.   Dg Chest Port 1 View  06/14/2012  *RADIOLOGY REPORT*  Clinical Data: Worsening dyspnea; history of diabetes.  PORTABLE CHEST - 1 VIEW  Comparison: Chest radiograph performed 06/13/2012  Findings: The lungs are well-aerated.  Vascular congestion is noted, with bibasilar airspace opacities; this may reflect interstitial edema, though pneumonia could conceivably have a similar appearance.  There is no evidence of pleural effusion or pneumothorax.  The cardiomediastinal silhouette is within normal limits.  No acute osseous abnormalities are seen.  IMPRESSION: Vascular congestion, with bibasilar airspace opacities; this may reflect mild interstitial edema, new from the prior study, though pneumonia again could have a similar appearance.  Original Report Authenticated By:  Tonia Ghent, M.D.    Scheduled Meds:    . atorvastatin  20 mg Oral q1800  . azithromycin  500 mg Intravenous Q24H  . calcium carbonate  1 tablet Oral Daily  . cefTRIAXone (ROCEPHIN)  IV  1 g Intravenous Q24H  . clopidogrel  75 mg Oral Q breakfast  . Fluticasone-Salmeterol  1 puff Inhalation Q12H  . furosemide  20 mg Oral Once  . insulin aspart  0-5 Units Subcutaneous QHS  . insulin aspart  0-9 Units Subcutaneous TID WC  . levothyroxine  125 mcg Oral QAC breakfast  . linagliptin  5 mg Oral Daily  . mirtazapine  15 mg Oral QHS  . niacin  500 mg Oral QHS  . pantoprazole  40 mg Oral Q1200  . sodium chloride  3 mL Intravenous Q12H  . sodium chloride      . Vitamin D (Ergocalciferol)  50,000 Units Oral Q7 days  . DISCONTD: clopidogrel  75 mg Oral Q breakfast  . DISCONTD: heparin subcutaneous  5,000 Units Subcutaneous Q8H   Continuous Infusions:    . sodium chloride 75 mL/hr at 06/14/12 1030   PRN Meds:.acetaminophen, albuterol, albuterol-ipratropium  Assessment & Plan    1. Acute respiratory failure secondary to committee for pneumonia in a patient with history of COPD and possibly acute on chronic diastolic CHF EF is preserved per echo - we'll continue nebulizer treatments, patient does not have any wheezing on exam, continue empiric antibiotics, follow cultures, oxygen supplementation as needed, blood pressure is improved we will try gentle oral Lasix 20 mg x1 as he does have some rales, he also has blood-tinged sputum most likely due to underlying pneumonia and cough, will hold his Plavix for 2 days, repeat 2 view chest x-ray, requested patient to follow with pulmonologist as outpatient which he currently declines he said he will defer that to his primary care physician. I have told to him and  his wife to discuss this with his primary care physician.     2. Hypertension- blood pressure slightly on the lower side his ARB will be held, monitor post Po Lasix.     3. Diabetes  by this type II- agree with continuing home medications along with sliding scale CBGs will be monitored q. a.c. at bedtime.  CBG (last 3)   Basename 06/15/12 0830 06/14/12 2112 06/14/12 1727  GLUCAP 144* 76 151*      4. History of CVA- no acute issues continue niacin, since some blood tinge in sputum due to PNA      5. Dyslipidemia. Niacin to be continued outpatient.    6. History of colon and thyroid cancer. No acute issues outpatient followup with PCP and oncologist as before.    7.CKD 4 - baseline creat appears around 1.8 - stable, monitor    DVT Prophylaxis  Heparin   Procedures  Echo      Leroy Sea M.D on 06/15/2012 at 9:31 AM  Between 7am to 7pm - Pager - [phone]  After 7pm go to [email or web address] - password [email or web address]  And look for the night coverage person covering for me after hours  [facility] Office  [phone]

## 2012-06-15 NOTE — Progress Notes (Signed)
Call to Dr. Thedore Mins as pt would like probiotics started as he has had several small loose stools. No foul smell, and stools are small. Pt states he has previous GI issues, and antibiotics usually cause diarrhea.

## 2012-06-16 DIAGNOSIS — I10 Essential (primary) hypertension: Secondary | ICD-10-CM

## 2012-06-16 DIAGNOSIS — J438 Other emphysema: Secondary | ICD-10-CM

## 2012-06-16 DIAGNOSIS — D696 Thrombocytopenia, unspecified: Secondary | ICD-10-CM

## 2012-06-16 LAB — POTASSIUM: Potassium: 4 mEq/L (ref 3.5–5.1)

## 2012-06-16 LAB — CULTURE, RESPIRATORY W GRAM STAIN

## 2012-06-16 LAB — MAGNESIUM: Magnesium: 1.8 mg/dL (ref 1.5–2.5)

## 2012-06-16 MED ORDER — FUROSEMIDE 20 MG PO TABS
10.0000 mg | ORAL_TABLET | Freq: Once | ORAL | Status: AC
  Administered 2012-06-16: 10 mg via ORAL
  Filled 2012-06-16: qty 0.5

## 2012-06-16 MED ORDER — LEVOFLOXACIN 750 MG PO TABS
750.0000 mg | ORAL_TABLET | Freq: Every day | ORAL | Status: DC
  Administered 2012-06-16 – 2012-06-17 (×2): 750 mg via ORAL
  Filled 2012-06-16 (×2): qty 1

## 2012-06-16 MED ORDER — LOPERAMIDE HCL 2 MG PO CAPS
2.0000 mg | ORAL_CAPSULE | Freq: Four times a day (QID) | ORAL | Status: DC | PRN
  Administered 2012-06-16: 2 mg via ORAL
  Filled 2012-06-16: qty 1

## 2012-06-16 MED ORDER — LOPERAMIDE HCL 2 MG PO CAPS
2.0000 mg | ORAL_CAPSULE | ORAL | Status: DC | PRN
  Administered 2012-06-16 – 2012-06-17 (×4): 2 mg via ORAL
  Filled 2012-06-16 (×4): qty 1

## 2012-06-16 NOTE — Progress Notes (Signed)
   CARE MANAGEMENT NOTE 06/16/2012  Patient:  David [name], David [name]   Account Number:  [id number]  Date Initiated:  06/16/2012  Documentation initiated by:  Jiles Crocker  Subjective/Objective Assessment:   ADMITTED WITH PNEUMONIA     Action/Plan:   Pcp: Dr. Carmelina Paddock AT HOME WITH SPOUSE; POSSIBLY NEEDS HHC AT DISCHARGE DEPENDING ON PT/OT EVAL   Anticipated DC Date:  06/23/2012   Anticipated DC Plan:  HOME W HOME HEALTH SERVICES      DC Planning Services  CM consult            Status of service:  In process, will continue to follow Medicare Important Message given?  NA - LOS <3 / Initial given by admissions (If response is "NO", the following Medicare IM given date fields will be blank)  Per UR Regulation:  Reviewed for med. necessity/level of care/duration of stay  Comments:  06/16/2012- B Tashae Inda RN, BSN, MHA

## 2012-06-16 NOTE — Progress Notes (Signed)
[facility]                                                                                Patient Demographics  David [name], is a 78 y.o. male  ZOX:[id number]  [id number]  DOB - [birth year]  Admit date - 06/13/2012  Admitting Physician Conley Canal, MD  Outpatient Primary MD for the patient is Ezequiel Kayser, MD  LOS - 3   Chief Complaint  Patient presents with  . Nausea  . Near Syncope        Subjective:   David [name] today has, No headache, No chest pain, No abdominal pain - No Nausea, No new weakness tingling or numbness,+ productive Cough - improved shortness of breath. Is having some diarrhea.  Objective:   Filed Vitals:   06/15/12 2015 06/15/12 2136 06/16/12 0700 06/16/12 0841  BP:  113/61 120/64 123/63  Pulse:  72 74 77  Temp:  98.4 F (36.9 C) 98.8 F (37.1 C) 97.8 F (36.6 C)  TempSrc:  Oral Oral Oral  Resp:  20 18 24   Height:      Weight:   86.9 kg (191 lb 9.3 oz)   SpO2: 92% 91% 94% 91%    Wt Readings from Last 3 Encounters:  06/16/12 86.9 kg (191 lb 9.3 oz)  05/28/12 87.998 kg (194 lb)  05/14/12 87.998 kg (194 lb)     Intake/Output Summary (Last 24 hours) at 06/16/12 1123 Last data filed at 06/16/12 1112  Gross per 24 hour  Intake    720 ml  Output    552 ml  Net    168 ml    Exam Awake Alert, Oriented *3, No new F.N deficits, Normal affect Byng.AT,PERRAL Supple Neck,No JVD, No cervical lymphadenopathy appriciated.  Symmetrical Chest wall movement, Good air movement bilaterally, coarse bilateral breath sounds few rales RRR,No Gallops,Rubs or new Murmurs, No Parasternal Heave +ve B.Sounds, Abd Soft, Non tender, No organomegaly appriciated, No rebound -guarding or rigidity. No Cyanosis, Clubbing or edema, No new Rash or bruise    Data Review  CBC  Lab 06/15/12 0500 06/13/12 2120  WBC 8.8 11.2*  HGB 12.3* 14.2  HCT 36.7* 42.7  PLT 137* 153  MCV 94.3 93.8  MCH 31.6 31.2  MCHC 33.5 33.3  RDW 14.7 14.3   LYMPHSABS -- --  MONOABS -- --  EOSABS -- --  BASOSABS -- --  BANDABS -- --    Chemistries   Lab 06/16/12 0410 06/15/12 0500 06/14/12 0725 06/13/12 2120  NA -- 140 138 138  K 4.0 3.8 4.1 4.1  CL -- 106 105 104  CO2 -- 25 22 22   GLUCOSE -- 145* 133* 176*  BUN -- 28* 31* 32*  CREATININE -- 1.78* 1.73* 1.69*  CALCIUM -- 8.6 8.3* 9.0  MG 1.8 1.8 -- --  AST -- -- 18 --  ALT -- -- 15 --  ALKPHOS -- -- 63 --  BILITOT -- -- 0.7 --   ------------------------------------------------------------------------------------------------------------------ estimated creatinine clearance is 37.5 ml/min (by C-G formula based on Cr of 1.78). ------------------------------------------------------------------------------------------------------------------  [facility] 06/13/12 2120  HGBA1C 6.6*   ------------------------------------------------------------------------------------------------------------------  Basename 06/14/12 0725  CHOL 92  HDL 34*  LDLCALC 45  TRIG 66  CHOLHDL 2.7  LDLDIRECT --   ------------------------------------------------------------------------------------------------------------------ No results found for this basename: TSH,T4TOTAL,FREET3,T3FREE,THYROIDAB in the last 72 hours ------------------------------------------------------------------------------------------------------------------ No results found for this basename: VITAMINB12:2,FOLATE:2,FERRITIN:2,TIBC:2,IRON:2,RETICCTPCT:2 in the last 72 hours  Coagulation profile No results found for this basename: INR:5,PROTIME:5 in the last 168 hours  No results found for this basename: DDIMER:2 in the last 72 hours  Cardiac Enzymes  Lab 06/14/12 1144 06/14/12 0725 06/14/12 0010  CKMB 4.1* 4.2* 2.4  TROPONINI <0.30 <0.30 <0.30  MYOGLOBIN -- -- --   ------------------------------------------------------------------------------------------------------------------ No components found with this basename:  POCBNP:3  Micro Results Recent Results (from the past 240 hour(s))  CULTURE, BLOOD (ROUTINE X 2)     Status: Normal (Preliminary result)   Collection Time   06/13/12  9:59 PM      Component Value Range Status Comment   Specimen Description BLOOD RIGHT ARM   Final    Special Requests BOTTLES DRAWN AEROBIC AND ANAEROBIC   Final    Culture  Setup Time [id number]   Final    Culture     Final    Value:        BLOOD CULTURE RECEIVED NO GROWTH TO DATE CULTURE WILL BE HELD FOR 5 DAYS BEFORE ISSUING A FINAL NEGATIVE REPORT   Report Status PENDING   Incomplete   URINE CULTURE     Status: Normal   Collection Time   06/13/12 10:00 PM      Component Value Range Status Comment   Specimen Description URINE, CLEAN CATCH   Final    Special Requests NONE   Final    Culture  Setup Time [id number]   Final    Colony Count 1,000 COLONIES/ML   Final    Culture INSIGNIFICANT GROWTH   Final    Report Status 06/14/2012 FINAL   Final   CULTURE, BLOOD (ROUTINE X 2)     Status: Normal (Preliminary result)   Collection Time   06/13/12 10:06 PM      Component Value Range Status Comment   Specimen Description BLOOD RIGHT HAND   Final    Special Requests BOTTLES DRAWN AEROBIC AND ANAEROBIC   Final    Culture  Setup Time [id number]   Final    Culture     Final    Value:        BLOOD CULTURE RECEIVED NO GROWTH TO DATE CULTURE WILL BE HELD FOR 5 DAYS BEFORE ISSUING A FINAL NEGATIVE REPORT   Report Status PENDING   Incomplete   MRSA PCR SCREENING     Status: Normal   Collection Time   06/14/12  4:30 AM      Component Value Range Status Comment   MRSA by PCR NEGATIVE  NEGATIVE Final   CULTURE, EXPECTORATED SPUTUM-ASSESSMENT     Status: Normal   Collection Time   06/14/12 10:59 AM      Component Value Range Status Comment   Specimen Description SPUTUM   Final    Special Requests Normal   Final    Sputum evaluation     Final    Value: THIS SPECIMEN IS ACCEPTABLE. RESPIRATORY CULTURE REPORT TO FOLLOW.     Report Status 06/14/2012 FINAL   Final   CULTURE, RESPIRATORY     Status: Normal   Collection Time   06/14/12 10:59 AM      Component Value Range Status Comment   Specimen Description SPUTUM   Final    Special Requests NONE  Final    Gram Stain     Final    Value: MODERATE WBC PRESENT, PREDOMINANTLY PMN     RARE SQUAMOUS EPITHELIAL CELLS PRESENT     RARE GRAM POSITIVE COCCI IN PAIRS   Culture NORMAL OROPHARYNGEAL FLORA   Final    Report Status 06/16/2012 FINAL   Final     Radiology Reports Dg Chest 2 View  06/13/2012  *RADIOLOGY REPORT*  Clinical Data: Chest tightness and shortness of breath; cough and fever.  Weakness.  History of diabetes.  CHEST - 2 VIEW  Comparison: CT of the chest performed 01/16/2011  Findings: The lungs are well-aerated.  Mild right basilar airspace opacity raises concern for pneumonia.  Scattered small bilateral pulmonary nodules are better characterized on the prior CT.  No definite pleural effusion or pneumothorax is seen.  The heart is normal in size; the mediastinal contour is within normal limits.  No acute osseous abnormalities are seen.  IMPRESSION:  1.  Mild right basilar airspace opacity raises concern for pneumonia. 2.  Scattered small bilateral pulmonary nodules again noted, better characterized on the prior CT.  Original Report Authenticated By: Tonia Ghent, M.D.   Dg Chest Port 1 View  06/14/2012  *RADIOLOGY REPORT*  Clinical Data: Worsening dyspnea; history of diabetes.  PORTABLE CHEST - 1 VIEW  Comparison: Chest radiograph performed 06/13/2012  Findings: The lungs are well-aerated.  Vascular congestion is noted, with bibasilar airspace opacities; this may reflect interstitial edema, though pneumonia could conceivably have a similar appearance.  There is no evidence of pleural effusion or pneumothorax.  The cardiomediastinal silhouette is within normal limits.  No acute osseous abnormalities are seen.  IMPRESSION: Vascular congestion, with bibasilar  airspace opacities; this may reflect mild interstitial edema, new from the prior study, though pneumonia again could have a similar appearance.  Original Report Authenticated By: Tonia Ghent, M.D.    Scheduled Meds:    . atorvastatin  20 mg Oral q1800  . calcium carbonate  1 tablet Oral Daily  . clopidogrel  75 mg Oral Q breakfast  . Flora-Q  1 capsule Oral Daily  . Fluticasone-Salmeterol  1 puff Inhalation Q12H  . furosemide  10 mg Oral Once  . insulin aspart  0-5 Units Subcutaneous QHS  . insulin aspart  0-9 Units Subcutaneous TID WC  . levofloxacin  750 mg Oral Daily  . levothyroxine  125 mcg Oral QAC breakfast  . linagliptin  5 mg Oral Daily  . mirtazapine  15 mg Oral QHS  . niacin  500 mg Oral QHS  . pantoprazole  40 mg Oral Q1200  . potassium chloride  20 mEq Oral Once  . sodium chloride  3 mL Intravenous Q12H  . Vitamin D (Ergocalciferol)  50,000 Units Oral Q7 days  . DISCONTD: azithromycin  500 mg Intravenous Q24H  . DISCONTD: cefTRIAXone (ROCEPHIN)  IV  1 g Intravenous Q24H   Continuous Infusions:   PRN Meds:.acetaminophen, albuterol, albuterol-ipratropium  Assessment & Plan    1. Acute respiratory failure secondary to committee for pneumonia in a patient with history of COPD and possibly acute on chronic diastolic CHF - EF is preserved per echo - we'll continue nebulizer treatments, patient does not have any wheezing on exam, continue empiric antibiotics, follow cultures, oxygen supplementation as needed, blood pressure is improved we will try gentle oral Lasix again today, as he does have some rales, he also has blood-tinged sputum most likely due to underlying pneumonia and cough on 06/15/2012 which has improved,  will hold his Plavix for 2 days, repeat 2 view chest x-ray shows improved pulmonary edema, requested patient to follow with pulmonologist as outpatient which he currently declines he said he will defer that to his primary care physician. I have told to him and  his wife to discuss this with his primary care physician. Have switched him to by mouth Levaquin after 2 days of IV Rocephin and azithromycin as I think azithromycin is causing a lot of diarrhea.     2. Hypertension- blood pressure slightly on the lower side his ARB will be held, monitor post Po Lasix.     3. Diabetes by this type II- agree with continuing home medications along with sliding scale CBGs will be monitored q. a.c. at bedtime.  CBG (last 3)   Basename 06/16/12 0750 06/15/12 2155 06/15/12 1709  GLUCAP 115* 154* 142*      4. History of CVA- no acute issues continue niacin, since some blood tinge in sputum due to PNA Plavix being held on 06/15/2012 and 06/16/2012.    5. Dyslipidemia. Niacin to be continued outpatient.    6. History of colon and thyroid cancer. No acute issues outpatient followup with PCP and oncologist as before.    7.CKD 4 - baseline creat appears around 1.8 - stable, monitor    8. Diarrhea likely due to azithromycin, stop azithromycin and Rocephin switched to by mouth Levaquin, check C. difficile PCR is negative trial of Imodium.    DVT Prophylaxis  Heparin   Procedures  Echo C diff PCR     Susa Raring K M.D on 06/16/2012 at 11:23 AM  Between 7am to 7pm - Pager - [phone]  After 7pm go to [email or web address] - password [email or web address]  And look for the night coverage person covering for me after hours  [facility] Office  [phone]

## 2012-06-17 DIAGNOSIS — I10 Essential (primary) hypertension: Secondary | ICD-10-CM

## 2012-06-17 DIAGNOSIS — J438 Other emphysema: Secondary | ICD-10-CM

## 2012-06-17 DIAGNOSIS — D696 Thrombocytopenia, unspecified: Secondary | ICD-10-CM

## 2012-06-17 LAB — CBC
Hemoglobin: 12.4 g/dL — ABNORMAL LOW (ref 13.0–17.0)
MCH: 31.2 pg (ref 26.0–34.0)
MCV: 91.7 fL (ref 78.0–100.0)
Platelets: 178 10*3/uL (ref 150–400)
RBC: 3.97 MIL/uL — ABNORMAL LOW (ref 4.22–5.81)

## 2012-06-17 LAB — BASIC METABOLIC PANEL
BUN: 20 mg/dL (ref 6–23)
CO2: 23 mEq/L (ref 19–32)
Calcium: 9.4 mg/dL (ref 8.4–10.5)
Creatinine, Ser: 1.72 mg/dL — ABNORMAL HIGH (ref 0.50–1.35)
Glucose, Bld: 124 mg/dL — ABNORMAL HIGH (ref 70–99)

## 2012-06-17 MED ORDER — FUROSEMIDE 40 MG PO TABS
40.0000 mg | ORAL_TABLET | Freq: Once | ORAL | Status: AC
  Administered 2012-06-17: 40 mg via ORAL
  Filled 2012-06-17: qty 1

## 2012-06-17 MED ORDER — LEVOFLOXACIN 750 MG PO TABS: 750.0000 mg | ORAL_TABLET | Freq: Every day | ORAL | Status: AC

## 2012-06-17 MED ORDER — FLORA-Q PO CAPS: 1.0000 | ORAL_CAPSULE | Freq: Every day | ORAL | Status: DC

## 2012-06-17 MED ORDER — LOPERAMIDE HCL 2 MG PO CAPS: 2.0000 mg | ORAL_CAPSULE | ORAL | Status: AC | PRN

## 2012-06-17 NOTE — Discharge Instructions (Signed)
Follow with Primary MD Ezequiel Kayser, MD in 3 days   Get CBC, BMP, checked 3 days by Primary MD and again as instructed by your Primary MD. Get a 2 view Chest X ray done next visit.  Get Medicines reviewed and adjusted.   Accuchecks 4 times/day, Once in AM empty stomach and then before each meal. Log in all results and show them to your Prim.MD in 3 days. If any glucose reading is under 80 or above 300 call your Prim MD immidiately. Follow Low glucose instructions for glucose under 80 as instructed.   Please request your Prim.MD to go over all Hospital Tests and Procedure/Radiological results at the follow up, please get all Hospital records sent to your Prim MD by signing hospital release before you go home.  Activity: As tolerated with Full fall precautions use walker/cane & assistance as needed  Diet: Heart healthy - Low Carb,  Fluid restriction 1.8 lit/day, Aspiration precautions.  For Heart failure patients - Check your Weight same time everyday, if you gain over 2 pounds, or you develop in leg swelling, experience more shortness of breath or chest pain, call your Primary MD immediately. Follow Cardiac Low Salt Diet and 1.8 lit/day fluid restriction.  Disposition Home   If you experience worsening of your admission symptoms, develop shortness of breath, life threatening emergency, suicidal or homicidal thoughts you must seek medical attention immediately by calling 911 or calling your MD immediately  if symptoms less severe.  You Must read complete instructions/literature along with all the possible adverse reactions/side effects for all the Medicines you take and that have been prescribed to you. Take any new Medicines after you have completely understood and accpet all the possible adverse reactions/side effects.   Do not drive if your were admitted for syncope or siezures until you have seen by Primary MD or a Neurologist and advised to drive.  Do not drive when taking Pain  medications.    Do not take more than prescribed Pain, Sleep and Anxiety Medications  Special Instructions: If you have smoked or chewed Tobacco  in the last 2 yrs please stop smoking, stop any regular Alcohol  and or any Recreational drug use.  Wear Seat belts while driving.

## 2012-06-17 NOTE — Evaluation (Signed)
Physical Therapy Evaluation Patient Details Name: David [name] MRN: [id number] DOB: 09-Oct-1931 Today's Date: 06/17/2012 Time: 4401-0272 PT Time Calculation (min): 10 min  PT Assessment / Plan / Recommendation Clinical Impression  78 yo male admitted with SOB, weakness. Pt demonstrates general weakness and decreased activity tolerance. Recommended pt use RW for ambulation until stability and strength improve. Recommend HHPT.     PT Assessment  All further PT needs can be met in the next venue of care    Follow Up Recommendations  Home health PT    Barriers to Discharge        lEquipment Recommendations  None recommended by PT    Recommendations for Other Services     Frequency Min 3X/week    Precautions / Restrictions Precautions Precautions: Fall Restrictions Weight Bearing Restrictions: No   Pertinent Vitals/Pain O2 sats: 90-91% on RA during ambulation      Mobility  Bed Mobility Bed Mobility: Not assessed Transfers Transfers: Sit to Stand;Stand to Sit Sit to Stand: 4: Min assist;With upper extremity assist;From bed Stand to Sit: 4: Min guard;With upper extremity assist;To bed Details for Transfer Assistance: vCs safety, technique, hand placement. Assist to rise, stabilize. Uncontrolled descent to bed. (low surface) Ambulation/Gait Ambulation/Gait Assistance: 4: Min guard Ambulation Distance (Feet): 250 Feet Assistive device: Rolling walker Ambulation/Gait Assistance Details: Began ambulation without AD-pt reaching out for objects to hold onto. Had pt ambulate in hallway with RW-Min-guard assist. O2 sats 90-91% with ambulation. Fatigued at end of walk.  Gait Pattern: Step-through pattern    Exercises     PT Diagnosis: Difficulty walking;Generalized weakness  PT Problem List: Decreased strength;Decreased activity tolerance;Decreased mobility;Decreased knowledge of use of DME PT Treatment Interventions: Gait training;Functional mobility training;Therapeutic  activities;Therapeutic exercise;Patient/family education   PT Goals    Visit Information  Last PT Received On: 06/17/12 Assistance Needed: +1    Subjective Data  Subjective: "It's a busy place" Patient Stated Goal: Get stronger. breathe better   Prior Functioning  Home Living Lives With: Spouse Available Help at Discharge: Family Type of Home: House Home Access: Stairs to enter [employer] of Steps: 2 Home Layout: One level Home Adaptive Equipment: Walker - rolling;Straight cane;Shower chair without back Prior Function Level of Independence: Independent Able to Take Stairs?: Yes Communication Communication: No difficulties    Cognition  Overall Cognitive Status: Appears within functional limits for tasks assessed/performed Arousal/Alertness: Awake/alert Orientation Level: Appears intact for tasks assessed Behavior During Session: [facility] for tasks performed    Extremity/Trunk Assessment Right Lower Extremity Assessment RLE ROM/Strength/Tone: [facility] for tasks assessed RLE Coordination: WFL - gross motor Left Lower Extremity Assessment LLE ROM/Strength/Tone: WFL for tasks assessed LLE ROM/Strength/Tone Deficits: PT wife report some minimal residual weakness from CVA 06 LLE Coordination: WFL - gross motor Trunk Assessment Trunk Assessment: Normal   Balance    End of Session PT - End of Session Equipment Utilized During Treatment: Gait belt Activity Tolerance: Patient limited by fatigue Patient left: in bed;with call bell/phone within reach;with family/visitor present   Rebeca Alert [facility] 06/17/2012, 11:51 AM [phone]

## 2012-06-17 NOTE — Discharge Summary (Signed)
[facility]                                                                                   David [name], is a 78 y.o. male  DOB 12-26-1930  MRN [id number].  Admission date:  06/13/2012  Discharge Date:  06/17/2012  Primary MD  Ezequiel Kayser, MD  Admitting Physician  Conley Canal, MD  Admission Diagnosis  Hypoxia [799.02] CAP (community acquired pneumonia) [486] nausea;near syncope  Discharge Diagnosis     Principal Problem:  *Community acquired pneumonia Active Problems:  CVA  EMPHYSEMA  COLON CANCER, PERSONAL HX  THYROID CANCER, HX OF  Type II or unspecified type diabetes mellitus without mention of complication, not stated as uncontrolled  HTN (hypertension)  Dyslipidemia      Past Medical History  Diagnosis Date  . COPD (chronic obstructive pulmonary disease)   . Hypertension   . Gastroesophageal reflux disease   . Hyperlipidemia   . AAA (abdominal aortic aneurysm)   . Thyroid disease   . Lung nodules   . Diabetes mellitus   . ED (erectile dysfunction)   . BPH (benign prostatic hyperplasia)   . CKD (chronic kidney disease), stage III   . Positional vertigo   . Osteopenia   . AAA (abdominal aortic aneurysm)   . Vitamin d deficiency   . Atherosclerosis     pt denies this  . Peripheral vascular disease   . Colon cancer   . Thyroid cancer   . Stroke   . Stroke     Past Surgical History  Procedure Date  . Colon resection 2007  . Thyroidectomy 2008  . Septoplasty      Hospital Course See H&P, Labs, Consult and Test reports for all details in brief, patient was admitted for   1. Acute respiratory failure secondary to community-acquired pneumonia along with mild acute on chronic diastolic CHF versus fluid overload - patient much improved after empiric Levaquin, and 2 doses of Lasix, has been educated on fluid restriction and weight monitoring, he is not requiring any oxygen at this point his room air post ambulatory of pulse ox  is 92%. He will be discharged home on his home nebulizer treatment along with 4 more days of Levaquin to complete a total of one week, he will follow with his primary care physician in 2-3 days will request primary care physician to kindly check a CBC and view chest x-ray point . He did have mild couple of episodes of phlegm streaked with blood, I offered him pulmonary input however he declined and suggested that he will discuss this with his primary care physician.     2. Hypertension- blood pressure now stable resume home medication.   3. Diabetes by this type II- CBGs remained stable at this point we'll resume his home medications and request him to do Accu-Cheks q. a.c. at bedtime.    CBG (last 3)   Basename 06/17/12 1152 06/17/12 0753 06/16/12 2111  GLUCAP 94 101* 124*      4. History of CVA- no acute issues continue niacin and Plavix, monitor for any bleeding in his phlegm which happened on the first day of  his hospitalization.    5. Dyslipidemia. Niacin to be continued outpatient.     6. History of colon and thyroid cancer. No acute issues outpatient followup with PCP and oncologist as before.     7.CKD 4 - baseline creat appears around 1.8 - stable, monitor     8. Diarrhea likely due to azithromycin, stopped azithromycin and Rocephin switched to by mouth Levaquin, he should was negative for C. difficile PCR, when necessary Imodium, diarrhea is better .    9.Generalized weakness was able to walk the length of the hallway with PT, no oxygen requirements, although he does appear deconditioned overall, will order home PT .     Consults  PT  Significant Tests:  See full reports for all details     Dg Chest 2 View  06/15/2012  *RADIOLOGY REPORT*  Clinical Data: Cough.  Fever.  Chest congestion.  History of thyroid cancer and colon cancer.  CHEST - 2 VIEW  Comparison: Portable chest x-ray yesterday.  Two-view chest x-ray 06/13/2012.  CT chest 01/16/2011, 01/02/2010.   Findings: Cardiac silhouette normal in size, unchanged.  Thoracic aorta tortuous atherosclerotic, unchanged.  Hilar and mediastinal contours otherwise unremarkable.  Interstitial pulmonary edema identified yesterday has resolved in the interval.  Minimal linear atelectasis in the lower lobes, with more confluent airspace opacity posteriorly in the left lower lobe, unchanged.  Calcified granulomata in the upper lobes.  No new pulmonary parenchymal abnormalities.  Visualized bony thorax intact.  IMPRESSION: Resolution of interstitial pulmonary edema since yesterday. Persistent patchy pneumonia in the deep posterior left lower lobe and persistent mild atelectasis in both lower lobes.  No new abnormalities.  Original Report Authenticated By: Arnell Sieving, M.D.   Dg Chest 2 View  06/13/2012  *RADIOLOGY REPORT*  Clinical Data: Chest tightness and shortness of breath; cough and fever.  Weakness.  History of diabetes.  CHEST - 2 VIEW  Comparison: CT of the chest performed 01/16/2011  Findings: The lungs are well-aerated.  Mild right basilar airspace opacity raises concern for pneumonia.  Scattered small bilateral pulmonary nodules are better characterized on the prior CT.  No definite pleural effusion or pneumothorax is seen.  The heart is normal in size; the mediastinal contour is within normal limits.  No acute osseous abnormalities are seen.  IMPRESSION:  1.  Mild right basilar airspace opacity raises concern for pneumonia. 2.  Scattered small bilateral pulmonary nodules again noted, better characterized on the prior CT.  Original Report Authenticated By: Tonia Ghent, M.D.   Dg Chest Port 1 View  06/14/2012  *RADIOLOGY REPORT*  Clinical Data: Worsening dyspnea; history of diabetes.  PORTABLE CHEST - 1 VIEW  Comparison: Chest radiograph performed 06/13/2012  Findings: The lungs are well-aerated.  Vascular congestion is noted, with bibasilar airspace opacities; this may reflect interstitial edema, though  pneumonia could conceivably have a similar appearance.  There is no evidence of pleural effusion or pneumothorax.  The cardiomediastinal silhouette is within normal limits.  No acute osseous abnormalities are seen.  IMPRESSION: Vascular congestion, with bibasilar airspace opacities; this may reflect mild interstitial edema, new from the prior study, though pneumonia again could have a similar appearance.  Original Report Authenticated By: Tonia Ghent, M.D.     Today   Subjective:   David [name] today has no headache,no chest abdominal pain,no new weakness tingling or numbness, feels much better wants to go home today.    Objective:   Blood pressure 126/70, pulse 74, temperature 97.8 F (36.6 C), temperature  source Oral, resp. rate 20, height 6' (1.829 m), weight 85.367 kg (188 lb 3.2 oz), SpO2 93.00%.  Intake/Output Summary (Last 24 hours) at 06/17/12 1156 Last data filed at 06/17/12 1113  Gross per 24 hour  Intake    623 ml  Output      0 ml  Net    623 ml    Exam Awake Alert, Oriented *3, No new F.N deficits, Normal affect [place].AT,PERRAL Supple Neck,No JVD, No cervical lymphadenopathy appriciated.  Symmetrical Chest wall movement, Good air movement bilaterally, few bibasilar rales RRR,No Gallops,Rubs or new Murmurs, No Parasternal Heave +ve B.Sounds, Abd Soft, Non tender, No organomegaly appriciated, No rebound -guarding or rigidity. No Cyanosis, Clubbing or edema, No new Rash or bruise    Data Review     Recent Results (from the past 240 hour(s))  CULTURE, BLOOD (ROUTINE X 2)     Status: Normal (Preliminary result)   Collection Time   06/13/12  9:59 PM      Component Value Range Status Comment   Specimen Description BLOOD RIGHT ARM   Final    Special Requests BOTTLES DRAWN AEROBIC AND ANAEROBIC   Final    Culture  Setup Time [id number]   Final    Culture     Final    Value:        BLOOD CULTURE RECEIVED NO GROWTH TO DATE CULTURE WILL BE HELD FOR 5 DAYS  BEFORE ISSUING A FINAL NEGATIVE REPORT   Report Status PENDING   Incomplete   URINE CULTURE     Status: Normal   Collection Time   06/13/12 10:00 PM      Component Value Range Status Comment   Specimen Description URINE, CLEAN CATCH   Final    Special Requests NONE   Final    Culture  Setup Time [id number]   Final    Colony Count 1,000 COLONIES/ML   Final    Culture INSIGNIFICANT GROWTH   Final    Report Status 06/14/2012 FINAL   Final   CULTURE, BLOOD (ROUTINE X 2)     Status: Normal (Preliminary result)   Collection Time   06/13/12 10:06 PM      Component Value Range Status Comment   Specimen Description BLOOD RIGHT HAND   Final    Special Requests BOTTLES DRAWN AEROBIC AND ANAEROBIC   Final    Culture  Setup Time [id number]   Final    Culture     Final    Value:        BLOOD CULTURE RECEIVED NO GROWTH TO DATE CULTURE WILL BE HELD FOR 5 DAYS BEFORE ISSUING A FINAL NEGATIVE REPORT   Report Status PENDING   Incomplete   MRSA PCR SCREENING     Status: Normal   Collection Time   06/14/12  4:30 AM      Component Value Range Status Comment   MRSA by PCR NEGATIVE  NEGATIVE Final   CULTURE, EXPECTORATED SPUTUM-ASSESSMENT     Status: Normal   Collection Time   06/14/12 10:59 AM      Component Value Range Status Comment   Specimen Description SPUTUM   Final    Special Requests Normal   Final    Sputum evaluation     Final    Value: THIS SPECIMEN IS ACCEPTABLE. RESPIRATORY CULTURE REPORT TO FOLLOW.   Report Status 06/14/2012 FINAL   Final   CULTURE, RESPIRATORY     Status: Normal   Collection Time  06/14/12 10:59 AM      Component Value Range Status Comment   Specimen Description SPUTUM   Final    Special Requests NONE   Final    Gram Stain     Final    Value: MODERATE WBC PRESENT, PREDOMINANTLY PMN     RARE SQUAMOUS EPITHELIAL CELLS PRESENT     RARE GRAM POSITIVE COCCI IN PAIRS   Culture NORMAL OROPHARYNGEAL FLORA   Final    Report Status 06/16/2012 FINAL   Final     CLOSTRIDIUM DIFFICILE BY PCR     Status: Normal   Collection Time   06/16/12 11:14 AM      Component Value Range Status Comment   C difficile by pcr NEGATIVE  NEGATIVE Final      CBC w Diff: Lab Results  Component Value Date   WBC 8.8 06/17/2012   WBC 4.2 01/17/2011   HGB 12.4* 06/17/2012   HGB 14.1 01/17/2011   HCT 36.4* 06/17/2012   HCT 40.6 01/17/2011   PLT 178 06/17/2012   PLT 152 01/17/2011   LYMPHOPCT 22.3 01/17/2011   MONOPCT 16.5* 01/17/2011   EOSPCT 2.8 01/17/2011   BASOPCT 0.5 01/17/2011    CMP: Lab Results  Component Value Date   NA 136 06/17/2012   K 4.1 06/17/2012   CL 101 06/17/2012   CO2 23 06/17/2012   BUN 20 06/17/2012   CREATININE 1.72* 06/17/2012   PROT 6.6 06/14/2012   ALBUMIN 3.5 06/14/2012   BILITOT 0.7 06/14/2012   ALKPHOS 63 06/14/2012   AST 18 06/14/2012   ALT 15 06/14/2012  .   Discharge Instructions     Follow with Primary MD Rodrigo Ran A, MD in 3 days   Get CBC, BMP, checked 3 days by Primary MD and again as instructed by your Primary MD. Get a 2 view Chest X ray done next visit.  Get Medicines reviewed and adjusted.   Accuchecks 4 times/day, Once in AM empty stomach and then before each meal. Log in all results and show them to your Prim.MD in 3 days. If any glucose reading is under 80 or above 300 call your Prim MD immidiately. Follow Low glucose instructions for glucose under 80 as instructed.   Please request your Prim.MD to go over all Hospital Tests and Procedure/Radiological results at the follow up, please get all Hospital records sent to your Prim MD by signing hospital release before you go home.  Activity: As tolerated with Full fall precautions use walker/cane & assistance as needed  Diet: Heart healthy - Low Carb,  Fluid restriction 1.8 lit/day, Aspiration precautions.  For Heart failure patients - Check your Weight same time everyday, if you gain over 2 pounds, or you develop in leg swelling, experience more shortness of breath or  chest pain, call your Primary MD immediately. Follow Cardiac Low Salt Diet and 1.8 lit/day fluid restriction.  Disposition Home   If you experience worsening of your admission symptoms, develop shortness of breath, life threatening emergency, suicidal or homicidal thoughts you must seek medical attention immediately by calling 911 or calling your MD immediately  if symptoms less severe.  You Must read complete instructions/literature along with all the possible adverse reactions/side effects for all the Medicines you take and that have been prescribed to you. Take any new Medicines after you have completely understood and accpet all the possible adverse reactions/side effects.   Do not drive if your were admitted for syncope or siezures until you have seen by  Primary MD or a Neurologist and advised to drive.  Do not drive when taking Pain medications.    Do not take more than prescribed Pain, Sleep and Anxiety Medications  Special Instructions: If you have smoked or chewed Tobacco  in the last 2 yrs please stop smoking, stop any regular Alcohol  and or any Recreational drug use.  Wear Seat belts while driving.  Follow-up Information    Follow up with PERINI,MARK A, MD. Schedule an appointment as soon as possible for a visit in 3 days.   Contact information:   [address] [facility] [place] [address] [phone]          Discharge Medications    Brad, [place]  Home Medication Instructions [id number]   Printed on:06/17/12 1156  Medication Information                    Ipratropium-Albuterol (COMBIVENT IN) Inhale 2 puffs into the lungs 3 (three) times daily.            Fluticasone-Salmeterol (ADVAIR) 250-50 MCG/DOSE AEPB Inhale 1 puff into the lungs every 12 (twelve) hours.           omeprazole (PRILOSEC) 20 MG capsule Take 20 mg by mouth daily.           rosuvastatin (CRESTOR) 20 MG tablet Take 20 mg by mouth daily.            niacin (NIASPAN) 500 MG CR tablet Take 500 mg by mouth at bedtime.           sitaGLIPtin (JANUVIA) 100 MG tablet Take 50 mg by mouth daily.            losartan-hydrochlorothiazide (HYZAAR) 100-25 MG per tablet Take 1 tablet by mouth daily.           levothyroxine (SYNTHROID, LEVOTHROID) 125 MCG tablet Take 125 mcg by mouth daily.           Vitamin D, Ergocalciferol, (DRISDOL) 50000 UNITS CAPS Take 50,000 Units by mouth every 7 (seven) days.           calcium carbonate (OS-CAL) 1250 MG chewable tablet Chew 1 tablet by mouth daily.           mirtazapine (REMERON) 15 MG tablet Take 15 mg by mouth at bedtime.           clopidogrel (PLAVIX) 75 MG tablet Take 75 mg by mouth daily.           acetaminophen (TYLENOL) 500 MG tablet Take 500 mg by mouth every 6 (six) hours as needed. For pain           fluocinonide (LIDEX) 0.05 % external solution Apply 1 application topically 2 (two) times daily as needed. For irritation           triamcinolone cream (KENALOG) 0.1 % Apply 1 application topically 2 (two) times daily.           levofloxacin (LEVAQUIN) 750 MG tablet Take 1 tablet (750 mg total) by mouth daily.           loperamide (IMODIUM) 2 MG capsule Take 1 capsule (2 mg total) by mouth as needed for diarrhea or loose stools (after each loose stool).           Flora-Q (FLORA-Q) CAPS Take 1 capsule by mouth daily.              Total Time in preparing paper work, data evaluation and  todays exam - 35 minutes  Leroy Sea M.D on 06/17/2012 at 11:56 AM  [facility] Office  [phone]

## 2012-06-17 NOTE — Progress Notes (Signed)
Talked to patient with spouse present about HHC; options of HHC agencies given to the patient, patient chose [facility]; Norberta Keens RN with [facility] called for arrangements; B Ave Filter RN, BSN, [facility]

## 2012-06-17 NOTE — Progress Notes (Signed)
Pt's pulse ox on room air while ambulating is 91%.

## 2012-06-20 LAB — CULTURE, BLOOD (ROUTINE X 2)
Culture  Setup Time: 201306220136
Culture  Setup Time: 201306220136
Culture: NO GROWTH

## 2012-09-19 DIAGNOSIS — G629 Polyneuropathy, unspecified: Secondary | ICD-10-CM | POA: Insufficient documentation

## 2013-02-16 ENCOUNTER — Encounter: Payer: Self-pay | Admitting: Neurosurgery

## 2013-02-17 ENCOUNTER — Encounter (INDEPENDENT_AMBULATORY_CARE_PROVIDER_SITE_OTHER): Payer: Medicare Other | Admitting: *Deleted

## 2013-02-17 ENCOUNTER — Other Ambulatory Visit: Payer: Self-pay | Admitting: *Deleted

## 2013-02-17 ENCOUNTER — Encounter: Payer: Self-pay | Admitting: Neurosurgery

## 2013-02-17 ENCOUNTER — Ambulatory Visit (INDEPENDENT_AMBULATORY_CARE_PROVIDER_SITE_OTHER): Payer: Medicare Other | Admitting: Neurosurgery

## 2013-02-17 VITALS — BP 127/74 | HR 53 | Resp 18 | Ht 72.0 in | Wt 197.0 lb

## 2013-02-17 DIAGNOSIS — I714 Abdominal aortic aneurysm, without rupture, unspecified: Secondary | ICD-10-CM | POA: Insufficient documentation

## 2013-02-17 NOTE — Progress Notes (Signed)
[facility] [place] AAA/Carotid Office Note  CC: AAA surveillance Referring Physician: Early  History of Present Illness: 79 year old male patient of Dr. Arbie Cookey followed for known AAA. The patient denies any unusual abdominal or back pain. The patient denies any new medical diagnoses recent surgery.  Past Medical History  Diagnosis Date  . COPD (chronic obstructive pulmonary disease)   . Hypertension   . Gastroesophageal reflux disease   . Hyperlipidemia   . AAA (abdominal aortic aneurysm)   . Thyroid disease   . Lung nodules   . Diabetes mellitus   . ED (erectile dysfunction)   . BPH (benign prostatic hyperplasia)   . CKD (chronic kidney disease), stage III   . Positional vertigo   . Osteopenia   . AAA (abdominal aortic aneurysm)   . Vitamin D deficiency   . Atherosclerosis     pt denies this  . Peripheral vascular disease   . Colon cancer   . Thyroid cancer   . Stroke   . Stroke   . Pneumonia June 2013    ROS: [x]  Positive   [ ]  Denies    General: [ ]  Weight loss, [ ]  Fever, [ ]  chills Neurologic: [ ]  Dizziness, [ ]  Blackouts, [ ]  Seizure [ ]  Stroke, [ ]  "Mini stroke", [ ]  Slurred speech, [ ]  Temporary blindness; [ ]  weakness in arms or legs, [ ]  Hoarseness Cardiac: [ ]  Chest pain/pressure, [ ]  Shortness of breath at rest [ ]  Shortness of breath with exertion, [ ]  Atrial fibrillation or irregular heartbeat Vascular: [ ]  Pain in legs with walking, [ ]  Pain in legs at rest, [ ]  Pain in legs at night,  [ ]  Non-healing ulcer, [ ]  Blood clot in vein/DVT,   Pulmonary: [ ]  Home oxygen, [ ]  Productive cough, [ ]  Coughing up blood, [ ]  Asthma,  [ ]  Wheezing Musculoskeletal:  [ ]  Arthritis, [ ]  Low back pain, [ ]  Joint pain Hematologic: [ ]  Easy Bruising, [ ]  Anemia; [ ]  Hepatitis Gastrointestinal: [ ]  Blood in stool, [ ]  Gastroesophageal Reflux/heartburn, [ ]  Trouble swallowing Urinary: [ ]  chronic Kidney disease, [ ]  on HD - [ ]  MWF or [ ]  TTHS, [ ]   Burning with urination, [ ]  Difficulty urinating Skin: [ ]  Rashes, [ ]  Wounds Psychological: [ ]  Anxiety, [ ]  Depression   Social History History  Substance Use Topics  . Smoking status: Former Smoker -- 1.50 packs/day for 50 years    Types: Cigarettes    Quit date: 05/28/2005  . Smokeless tobacco: Never Used  . Alcohol Use: No    Family History Family History  Problem Relation Age of Onset  . Stomach cancer Mother   . Heart failure Mother   . Pneumonia Father   . Breast cancer Mother   . Diabetes Paternal Uncle     No Known Allergies  Current Outpatient Prescriptions  Medication Sig Dispense Refill  . acetaminophen (TYLENOL) 500 MG tablet Take 500 mg by mouth every 6 (six) hours as needed. For pain      . calcium carbonate (OS-CAL) 1250 MG chewable tablet Chew 1 tablet by mouth daily.      . clopidogrel (PLAVIX) 75 MG tablet Take 75 mg by mouth daily.      . Fluticasone-Salmeterol (ADVAIR) 250-50 MCG/DOSE AEPB Inhale 1 puff into the lungs every 12 (twelve) hours.      . Ipratropium-Albuterol (COMBIVENT IN) Inhale 2 puffs into the lungs 3 (three)  times daily.       Marland Kitchen levothyroxine (SYNTHROID, LEVOTHROID) 125 MCG tablet Take 125 mcg by mouth daily.      Marland Kitchen losartan-hydrochlorothiazide (HYZAAR) 100-25 MG per tablet Take 0.5 tablets by mouth daily.       . mirtazapine (REMERON) 15 MG tablet Take 15 mg by mouth at bedtime.      . niacin (NIASPAN) 500 MG CR tablet Take 500 mg by mouth at bedtime.      Marland Kitchen nystatin (MYCOSTATIN) 100000 UNIT/ML suspension       . omeprazole (PRILOSEC) 20 MG capsule Take 20 mg by mouth daily.      . rosuvastatin (CRESTOR) 20 MG tablet Take 20 mg by mouth daily.      . sitaGLIPtin (JANUVIA) 100 MG tablet Take 50 mg by mouth daily.       . TOBRADEX ST 0.3-0.05 % SUSP       . Vitamin D, Ergocalciferol, (DRISDOL) 50000 UNITS CAPS Take 50,000 Units by mouth every 7 (seven) days.      Donald Prose (FLORA-Q) CAPS Take 1 capsule by mouth daily.  30 capsule  0   . fluocinonide (LIDEX) 0.05 % external solution Apply 1 application topically 2 (two) times daily as needed. For irritation      . triamcinolone cream (KENALOG) 0.1 % Apply 1 application topically 2 (two) times daily.       No current facility-administered medications for this visit.    Physical Examination  Filed Vitals:   02/17/13 0952  BP: 127/74  Pulse: 53  Resp: 18    Body mass index is 26.71 kg/(m^2).  General:  WDWN in NAD Gait: Normal HEENT: WNL Eyes: Pupils equal Pulmonary: normal non-labored breathing , without Rales, rhonchi,  wheezing Cardiac: RRR, without  Murmurs, rubs or gallops; Abdomen: soft, NT, no masses Skin: no rashes, ulcers noted  Vascular Exam Pulses: 3+ radial pulses bilaterally, there is a very mild nontender pulsatile abdominal mass noted Carotid bruits: Carotid pulses to auscultation Extremities without ischemic changes, no Gangrene , no cellulitis; no open wounds;  Musculoskeletal: no muscle wasting or atrophy   Neurologic: A&O X 3; Appropriate Affect ; SENSATION: normal; MOTOR FUNCTION:  moving all extremities equally. Speech is fluent/normal  Non-Invasive Vascular Imaging AAA duplex shows a maximum diameter of 4.9 which is consistent with previous exam one year ago  ASSESSMENT/PLAN: Asymptomatic patient that will followup in 6 months with repeat AAA duplex. The patient's questions were encouraged and answered, he is in agreement with this plan.  Lauree Chandler ANP   Clinic MD: Hart [place]

## 2013-08-17 ENCOUNTER — Encounter: Payer: Self-pay | Admitting: Vascular Surgery

## 2013-08-18 ENCOUNTER — Ambulatory Visit (INDEPENDENT_AMBULATORY_CARE_PROVIDER_SITE_OTHER): Payer: Medicare Other | Admitting: Family

## 2013-08-18 ENCOUNTER — Encounter (INDEPENDENT_AMBULATORY_CARE_PROVIDER_SITE_OTHER): Payer: Medicare Other | Admitting: *Deleted

## 2013-08-18 ENCOUNTER — Encounter: Payer: Self-pay | Admitting: Family

## 2013-08-18 VITALS — BP 122/71 | HR 52 | Resp 14 | Ht 72.0 in | Wt 196.0 lb

## 2013-08-18 DIAGNOSIS — I714 Abdominal aortic aneurysm, without rupture: Secondary | ICD-10-CM

## 2013-08-18 NOTE — Progress Notes (Signed)
[facility] [place]  Established Abdominal Aortic Aneurysm  History of Present Illness  David [name] is a 79 y.o. (05/25/1931) male who presents for scheduled follow up of known AAA found incidentally for colon cancer work up in 2006.  Previous Duplex 6 months ago demonstrate a AAA, measuring 4.9 cm.  The patient does not have back or abdominal pain.  The patient is not a smoker. He reports intermittent neuropathy sensation in feet: tingling, numbness, burning. He denies slow healing wounds. States he can walk about 1/4 mile before legs feel tired, left leg moreso since stroke. He also has lumbar disc issues.    Pt Diabetic: Yes Pt smoker: former smoker, quit 8 years ago  Past Medical History  Diagnosis Date  . COPD (chronic obstructive pulmonary disease)   . Hypertension   . Gastroesophageal reflux disease   . Hyperlipidemia   . AAA (abdominal aortic aneurysm)   . Thyroid disease   . Lung nodules   . Diabetes mellitus   . ED (erectile dysfunction)   . BPH (benign prostatic hyperplasia)   . CKD (chronic kidney disease), stage III   . Positional vertigo   . Osteopenia   . AAA (abdominal aortic aneurysm)   . Vitamin D deficiency   . Atherosclerosis     pt denies this  . Peripheral vascular disease   . Colon cancer   . Thyroid cancer   . Stroke   . Stroke   . Pneumonia June 2013   Past Surgical History  Procedure Laterality Date  . Colon resection  2007  . Thyroidectomy  2008  . Septoplasty     Social History History   Social History  . Marital Status: Married    Spouse Name: N/A    Number of Children: N/A  . Years of Education: N/A   Occupational History  . Retired    Social History Main Topics  . Smoking status: Former Smoker -- 1.50 packs/day for 50 years    Types: Cigarettes    Quit date: 05/28/2005  . Smokeless tobacco: Never Used  . Alcohol Use: No  . Drug Use: No  . Sexual Activity: Not Currently   Other Topics Concern   . Not on file   Social History Narrative  . No narrative on file   Family History Family History  Problem Relation Age of Onset  . Stomach cancer Mother   . Heart failure Mother   . Pneumonia Father   . Breast cancer Mother   . Diabetes Paternal Uncle     Current Outpatient Prescriptions on File Prior to Visit  Medication Sig Dispense Refill  . acetaminophen (TYLENOL) 500 MG tablet Take 500 mg by mouth every 6 (six) hours as needed. For pain      . calcium carbonate (OS-CAL) 1250 MG chewable tablet Chew 1 tablet by mouth daily.      . clopidogrel (PLAVIX) 75 MG tablet Take 75 mg by mouth daily.      Donald Prose (FLORA-Q) CAPS Take 1 capsule by mouth daily.  30 capsule  0  . fluocinonide (LIDEX) 0.05 % external solution Apply 1 application topically 2 (two) times daily as needed. For irritation      . Fluticasone-Salmeterol (ADVAIR) 250-50 MCG/DOSE AEPB Inhale 1 puff into the lungs every 12 (twelve) hours.      . Ipratropium-Albuterol (COMBIVENT IN) Inhale 2 puffs into the lungs 3 (three) times daily.       Marland Kitchen levothyroxine (SYNTHROID, LEVOTHROID)  125 MCG tablet Take 125 mcg by mouth daily.      Marland Kitchen losartan-hydrochlorothiazide (HYZAAR) 100-25 MG per tablet Take 0.5 tablets by mouth daily.       . mirtazapine (REMERON) 15 MG tablet Take 15 mg by mouth at bedtime.      . niacin (NIASPAN) 500 MG CR tablet Take 500 mg by mouth at bedtime.      Marland Kitchen nystatin (MYCOSTATIN) 100000 UNIT/ML suspension       . omeprazole (PRILOSEC) 20 MG capsule Take 20 mg by mouth daily.      . rosuvastatin (CRESTOR) 20 MG tablet Take 20 mg by mouth daily.      . sitaGLIPtin (JANUVIA) 100 MG tablet Take 50 mg by mouth daily.       . TOBRADEX ST 0.3-0.05 % SUSP       . triamcinolone cream (KENALOG) 0.1 % Apply 1 application topically 2 (two) times daily.      . Vitamin D, Ergocalciferol, (DRISDOL) 50000 UNITS CAPS Take 50,000 Units by mouth every 7 (seven) days.       No current facility-administered medications on  file prior to visit.   No Known Allergies  ROS: [x]  Positive   [ ]  Negative   [ ]  All sytems reviewed and are negative  General: [ ]  Weight loss, [ ]  Fever, [ ]  chills Neurologic: [ ]  Dizziness, [ ]  Blackouts, [ ]  Seizure [ ]  Stroke, [ ]  "Mini stroke", [ ]  Slurred speech, [ ]  Temporary blindness; [x ] weakness in arms or legs (in left leg moreso after walking about 1/4 mile since his stroke), Arly.Keller ] Hoarseness since his stroke and thyroidectomy for thyroid cancer 2006 and 2007. Cardiac: [ ]  Chest pain/pressure, [ ]  Shortness of breath at rest Arly.Keller ] Shortness of breath with exertion which he attributes to COPD, [ ]  Atrial fibrillation or irregular heartbeat Vascular: [ ]  Pain in legs with walking, [ ]  Pain in legs at rest, [ ]  Pain in legs at night,  [ ]  Non-healing ulcer, [ ]  Blood clot in vein/DVT,   Pulmonary: [ ]  Home oxygen, [ ]  Productive cough, [ ]  Coughing up blood, [ ]  Asthma,  [ ]  Wheezing Musculoskeletal:  [ ]  Arthritis, [ ]  Low back pain, [ ]  Joint pain Hematologic: [ ]  Easy Bruising, [ ]  Anemia; [ ]  Hepatitis Gastrointestinal: [ ]  Blood in stool, [ ]  Gastroesophageal Reflux/heartburn, [ ]  Trouble swallowing Urinary: [ ]  chronic Kidney disease, [ ]  on HD - [ ]  MWF or [ ]  TTHS, [ ]  Burning with urination, [ ]  Difficulty urinating Skin: Arly.Keller ] pruritic Rash on right leg only, [ ]  Wounds Psychological: [ ]  Anxiety, [ ]  Depression  Physical Examination  Filed Vitals:   08/18/13 0924  BP: 122/71  Pulse: 52  Resp: 14   Filed Weights   08/18/13 0924  Weight: 196 lb (88.905 kg)   Body mass index is 26.58 kg/(m^2).  General: A&O x 3, WD.  Pulmonary: Sym exp, fair air movt, CTAB, no rales, rhonchi, & wheezing.   Cardiac: RRR, distant heart sounds, Nl S1, S2, no murmurs.  Carotid Bruits Left Right   Negative Negative                             VASCULAR EXAM:  LE Pulses  LEFT RIGHT       FEMORAL   palpable   palpable        POPLITEAL  not palpable   not palpable       POSTERIOR TIBIAL   palpable    palpable        DORSALIS PEDIS      ANTERIOR TIBIAL palpable    palpable     Gastrointestinal: soft, NTND, -G/R, - HSM, - masses, - CVAT B.  Musculoskeletal: M/S 5/5 right UE and LE, 4/5 left UE and LE, Extremities without ischemic changes.  Neurologic: CN 2-12 intact  except slight left facial droop with smile, Pain and light touch intact in extremities, Motor exam as listed above.  Non-Invasive Vascular Imaging  AAA Duplex (08/18/2013)  Previous size: 4.9 cm (Date: 02/17/13)  Current size:  4.96 cm (Date: 08/18/2013)  CT Abdomen/Pelvis w/o contrast, 01/16/11: No significant change in 5.0 cm infrarenal abdominal aortic  aneurysm.   Medical Decision Making  The patient is a 79 y.o. male who presents with: asymptomatic AAA with no increase in size.   After discussing with Dr. Arbie Cookey and based on this patient's exam and diagnostic studies, he needs AAA duplex in 6 months and expect to surveill every 6 months.  The threshold for repair is AAA size > 5.5 cm, growth > 1 cm/yr, and symptomatic status.  I emphasized the importance of maximal medical management including strict control of blood pressure, blood glucose, and lipid levels, antiplatelet agents, obtaining regular exercise, and continued cessation of smoking.    He was advised that should he experience sudden onset abdominal or back pain that he should call 911 ASAP and request to be taken to [facility].  Thank you for allowing [place] to participate in this patient's care.  Charisse March, RN, MSN, FNP-C [facility] [place] Office: [phone]  Clinic Physician: Early  08/18/2013, 9:16 AM

## 2013-08-18 NOTE — Patient Instructions (Addendum)
Abdominal Aortic Aneurysm Aneurysms are weak places in the wall of an artery. An aneurysm bulges out like a balloon. The aorta is the main vessel in the chest and abdomen. An abdominal aortic aneurysm (AAA) is located in the abdomen. The aorta carries blood from your heart to the rest of the body. An aneurysm may be detected by physical exam. Imaging studies such as x-rays, CT, or ultrasound may also be used. Aneurysms usually develop with aging and the build-up of plaque in the artery wall. You are more likely to develop an aneurysm if you have a family history of aneurysms, if you smoke cigarettes, and\or if you have high blood pressure. When an abdominal aortic aneurysm is small, it may cause no problems. Those that measure less than 2 inches (5 cm) across may not require immediate surgery. Larger aneurysms are more likely to leak or rupture. When an aneurysm leaks or ruptures, it is a life-threatening emergency. Signs of a leaky or ruptured aneurysm include abdominal, flank, or back pain, severe weakness, fainting, and shock. Some people simply feel like their feet are asleep or their legs look pale. Be sure to follow-up with your caregiver as recommended so your AAA may be properly monitored.  SEEK IMMEDIATE MEDICAL CARE IF:  You develop severe abdominal, flank or back pain  You pass out or become light headed  You suddenly become weak, pale, or sweaty.  You develop pain in your legs, your legs lose their color or you cannot move them.  You develop blood in your stools.  You develop blood in your urine. Document Released: 12/10/2005 Document Revised: 03/03/2012 Document Reviewed: 05/05/2009 ExitCare Patient Information 2014 [employer].  

## 2014-03-15 ENCOUNTER — Encounter: Payer: Self-pay | Admitting: Family

## 2014-03-16 ENCOUNTER — Encounter: Payer: Self-pay | Admitting: Family

## 2014-03-16 ENCOUNTER — Ambulatory Visit (INDEPENDENT_AMBULATORY_CARE_PROVIDER_SITE_OTHER): Payer: Medicare Other | Admitting: Family

## 2014-03-16 ENCOUNTER — Ambulatory Visit (HOSPITAL_COMMUNITY)
Admission: RE | Admit: 2014-03-16 | Discharge: 2014-03-16 | Disposition: A | Discharge status: Home / Self Care | Payer: Medicare Other | Source: Ambulatory Visit | Attending: Family | Admitting: Family

## 2014-03-16 VITALS — BP 133/79 | HR 49 | Resp 14 | Ht 72.0 in | Wt 200.0 lb

## 2014-03-16 DIAGNOSIS — I714 Abdominal aortic aneurysm, without rupture, unspecified: Secondary | ICD-10-CM | POA: Insufficient documentation

## 2014-03-16 DIAGNOSIS — I6529 Occlusion and stenosis of unspecified carotid artery: Secondary | ICD-10-CM

## 2014-03-16 NOTE — Patient Instructions (Addendum)
Abdominal Aortic Aneurysm An aneurysm is a weakened or damaged part of an artery wall that bulges from the normal force of blood pumping through the body. An abdominal aortic aneurysm is an aneurysm that occurs in the lower part of the aorta, the main artery of the body.  The major concern with an abdominal aortic aneurysm is that it can enlarge and burst (rupture) or blood can flow between the layers of the wall of the aorta through a tear (aorticdissection). Both of these conditions can cause bleeding inside the body and can be life threatening unless diagnosed and treated promptly. CAUSES  The exact cause of an abdominal aortic aneurysm is unknown. Some contributing factors are:   A hardening of the arteries caused by the buildup of fat and other substances in the lining of a blood vessel (arteriosclerosis).  Inflammation of the walls of an artery (arteritis).   Connective tissue diseases, such as Marfan syndrome.   Abdominal trauma.   An infection, such as syphilis or staphylococcus, in the wall of the aorta (infectious aortitis) caused by bacteria. RISK FACTORS  Risk factors that contribute to an abdominal aortic aneurysm may include:  Age older than 60 years.   High blood pressure (hypertension).  Male gender.  Ethnicity (white race).  Obesity.  Family history of aneurysm (first degree relatives only).  Tobacco use. PREVENTION  The following healthy lifestyle habits may help decrease your risk of abdominal aortic aneurysm:  Quitting smoking. Smoking can raise your blood pressure and cause arteriosclerosis.  Limiting or avoiding alcohol.  Keeping your blood pressure, blood sugar level, and cholesterol levels within normal limits.  Decreasing your salt intake. In somepeople, too much salt can raise blood pressure and increase your risk of abdominal aortic aneurysm.  Eating a diet low in saturated fats and cholesterol.  Increasing your fiber intake by including  whole grains, vegetables, and fruits in your diet. Eating these foods may help lower blood pressure.  Maintaining a healthy weight.  Staying physically active and exercising regularly. SYMPTOMS  The symptoms of abdominal aortic aneurysm may vary depending on the size and rate of growth of the aneurysm.Most grow slowly and do not have any symptoms. When symptoms do occur, they may include:  Pain (abdomen, side, lower back, or groin). The pain may vary in intensity. A sudden onset of severe pain may indicate that the aneurysm has ruptured.  Feeling full after eating only small amounts of food.  Nausea or vomiting or both.  Feeling a pulsating lump in the abdomen.  Feeling faint or passing out. DIAGNOSIS  Since most unruptured abdominal aortic aneurysms have no symptoms, they are often discovered during diagnostic exams for other conditions. An aneurysm may be found during the following procedures:  Ultrasonography (A one-time screening for abdominal aortic aneurysm by ultrasonography is also recommended for all men aged 65-75 years who have ever smoked).  X-ray exams.  A computed tomography (CT).  Magnetic resonance imaging (MRI).  Angiography or arteriography. TREATMENT  Treatment of an abdominal aortic aneurysm depends on the size of your aneurysm, your age, and risk factors for rupture. Medication to control blood pressure and pain may be used to manage aneurysms smaller than 6 cm. Regular monitoring for enlargement may be recommended by your caregiver if:  The aneurysm is 3 4 cm in size (an annual ultrasonography may be recommended).  The aneurysm is 4 4.5 cm in size (an ultrasonography every 6 months may be recommended).  The aneurysm is larger than 4.5   cm in size (your caregiver may ask that you be examined by a vascular surgeon). If your aneurysm is larger than 6 cm, surgical repair may be recommended. There are two main methods for repair of an aneurysm:   Endovascular  repair (a minimally invasive surgery). This is done most often.  Open repair. This method is used if an endovascular repair is not possible. Document Released: 09/19/2005 Document Revised: 04/06/2013 Document Reviewed: 01/09/2013 ExitCare Patient Information 2014 [employer].   Stroke Prevention Some medical conditions and behaviors are associated with an increased chance of having a stroke. You may prevent a stroke by making healthy choices and managing medical conditions. HOW CAN I REDUCE MY RISK OF HAVING A STROKE?   Stay physically active. Get at least 30 minutes of activity on most or all days.  Do not smoke. It may also be helpful to avoid exposure to secondhand smoke.  Limit alcohol use. Moderate alcohol use is considered to be:  No more than 2 drinks per day for men.  No more than 1 drink per day for nonpregnant women.  Eat healthy foods. This involves  Eating 5 or more servings of fruits and vegetables a day.  Following a diet that addresses high blood pressure (hypertension), high cholesterol, diabetes, or obesity.  Manage your cholesterol levels.  A diet low in saturated fat, trans fat, and cholesterol and high in fiber may control cholesterol levels.  Take any prescribed medicines to control cholesterol as directed by your health care provider.  Manage your diabetes.  A controlled-carbohydrate, controlled-sugar diet is recommended to manage diabetes.  Take any prescribed medicines to control diabetes as directed by your health care provider.  Control your hypertension.  A low-salt (sodium), low-saturated fat, low-trans fat, and low-cholesterol diet is recommended to manage hypertension.  Take any prescribed medicines to control hypertension as directed by your health care provider.  Maintain a healthy weight.  A reduced-calorie, low-sodium, low-saturated fat, low-trans fat, low-cholesterol diet is recommended to manage weight.  Stop drug  abuse.  Avoid taking birth control pills.  Talk to your health care provider about the risks of taking birth control pills if you are over 35 years old, smoke, get migraines, or have ever had a blood clot.  Get evaluated for sleep disorders (sleep apnea).  Talk to your health care provider about getting a sleep evaluation if you snore a lot or have excessive sleepiness.  Take medicines as directed by your health care provider.  For some people, aspirin or blood thinners (anticoagulants) are helpful in reducing the risk of forming abnormal blood clots that can lead to stroke. If you have the irregular heart rhythm of atrial fibrillation, you should be on a blood thinner unless there is a good reason you cannot take them.  Understand all your medicine instructions.  Make sure that other other conditions (such as anemia or atherosclerosis) are addressed. SEEK IMMEDIATE MEDICAL CARE IF:   You have sudden weakness or numbness of the face, arm, or leg, especially on one side of the body.  Your face or eyelid droops to one side.  You have sudden confusion.  You have trouble speaking (aphasia) or understanding.  You have sudden trouble seeing in one or both eyes.  You have sudden trouble walking.  You have dizziness.  You have a loss of balance or coordination.  You have a sudden, severe headache with no known cause.  You have new chest pain or an irregular heartbeat. Any of these symptoms   may represent a serious problem that is an emergency. Do not wait to see if the symptoms will go away. Get medical help at once. Call your local emergency services  (911 in [place] Do not drive yourself to the hospital. Document Released: 01/17/2005 Document Revised: 09/30/2013 Document Reviewed: 06/12/2013 ExitCare Patient Information 2014 [employer].  

## 2014-03-16 NOTE — Progress Notes (Signed)
[facility] [place]  Established Abdominal Aortic Aneurysm  History of Present Illness  David [name] is a 80 y.o. (19-Dec-1931) male patient of Dr. Donnetta Hutching who presents for scheduled follow up of known AAA found incidentally for colon cancer work up in 2006. Previous Duplex 6 months ago demonstrate a AAA, measuring 4.9 cm. The patient does not have back or abdominal pain. The patient is not a smoker.  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 He reports intermittent neuropathy sensation in feet: tingling, numbness, burning. He denies slow healing wounds.  States he can walk about 1/4 mile before legs feel tired, left leg moreso since stroke. He also has lumbar disc issues.  He is taking Plavix and a statin daily. States his carotid arteries have not been checked, no results on file.  Pt Diabetic: Yes, states in good control Pt smoker: former smoker, quit 8 years ago  Patient denies any new medical problems or surgeries.  Past Medical History  Diagnosis Date  . COPD (chronic obstructive pulmonary disease)   . Hypertension   . Gastroesophageal reflux disease   . Hyperlipidemia   . AAA (abdominal aortic aneurysm)   . Thyroid disease   . Lung nodules   . Diabetes mellitus   . ED (erectile dysfunction)   . BPH (benign prostatic hyperplasia)   . CKD (chronic kidney disease), stage III   . Positional vertigo   . Osteopenia   . AAA (abdominal aortic aneurysm)   . Vitamin D deficiency   . Atherosclerosis     pt denies this  . Peripheral vascular disease   . Colon cancer   . Thyroid cancer   . Stroke   . Stroke   . Pneumonia June 2013   Past Surgical History  Procedure Laterality Date  . Colon resection  2007  . Thyroidectomy  2008  . Septoplasty     Social History History   Social History  . Marital Status: Married    Spouse Name: N/A    Number of Children: N/A  . Years of Education: N/A   Occupational History  . Retired    Social  History Main Topics  . Smoking status: Former Smoker -- 1.50 packs/day for 50 years    Types: Cigarettes    Quit date: 05/28/2005  . Smokeless tobacco: Never Used  . Alcohol Use: No  . Drug Use: No  . Sexual Activity: Not Currently   Other Topics Concern  . Not on file   Social History Narrative  . No narrative on file   Family History Family History  Problem Relation Age of Onset  . Stomach cancer Mother   . Heart failure Mother   . Pneumonia Father   . Breast cancer Mother   . Diabetes Paternal Uncle     Current Outpatient Prescriptions on File Prior to Visit  Medication Sig Dispense Refill  . acetaminophen (TYLENOL) 500 MG tablet Take 500 mg by mouth every 6 (six) hours as needed. For pain      . calcium carbonate (OS-CAL) 1250 MG chewable tablet Chew 1 tablet by mouth daily.      . clopidogrel (PLAVIX) 75 MG tablet Take 75 mg by mouth daily.      Maple Mirza (FLORA-Q) CAPS Take 1 capsule by mouth daily.  30 capsule  0  . fluocinonide (LIDEX) 0.05 % external solution Apply 1 application topically 2 (two) times daily as needed. For irritation      .  Fluticasone-Salmeterol (ADVAIR) 250-50 MCG/DOSE AEPB Inhale 1 puff into the lungs every 12 (twelve) hours.      Marland Kitchen ipratropium (ATROVENT) 0.02 % nebulizer solution Take 500 mcg by nebulization 2 (two) times daily.      . Ipratropium-Albuterol (COMBIVENT IN) Inhale 2 puffs into the lungs 3 (three) times daily.       Marland Kitchen levothyroxine (SYNTHROID, LEVOTHROID) 125 MCG tablet Take 125 mcg by mouth daily.      Marland Kitchen losartan-hydrochlorothiazide (HYZAAR) 100-25 MG per tablet Take 0.5 tablets by mouth daily.       . mirtazapine (REMERON) 15 MG tablet Take 15 mg by mouth at bedtime.      . niacin (NIASPAN) 500 MG CR tablet Take 500 mg by mouth at bedtime.      Marland Kitchen nystatin (MYCOSTATIN) 100000 UNIT/ML suspension       . omeprazole (PRILOSEC) 20 MG capsule Take 20 mg by mouth daily.      . rosuvastatin (CRESTOR) 20 MG tablet Take 20 mg by mouth daily.       . sitaGLIPtin (JANUVIA) 100 MG tablet Take 50 mg by mouth daily.       . TOBRADEX ST 0.3-0.05 % SUSP       . triamcinolone cream (KENALOG) 0.1 % Apply 1 application topically 2 (two) times daily.      . Vitamin D, Ergocalciferol, (DRISDOL) 50000 UNITS CAPS Take 50,000 Units by mouth every 7 (seven) days.       No current facility-administered medications on file prior to visit.   No Known Allergies  ROS: See HPI for pertinent positives and negatives.  Physical Examination  Filed Vitals:   03/16/14 0956  BP: 133/79  Pulse: 49  Resp: 14   Filed Weights   03/16/14 0956  Weight: 200 lb (90.719 kg)    Body mass index is 27.12 kg/(m^2).  General: A&O x 3, WD,.  Pulmonary: Sym exp, good air movt, CTAB, no rales, rhonchi, or wheezing.  Cardiac: RRR with frequent premature beats, Nl S1, S2, no detected murmur.   Carotid Bruits Left Right   Negative Negative   Aorta is not palpable Radial pulses are 2 + palpable and =                          VASCULAR EXAM:                                                                                                         LE Pulses LEFT RIGHT       FEMORAL   palpable   palpable        POPLITEAL  not palpable   not palpable       POSTERIOR TIBIAL  not palpable    palpable        DORSALIS PEDIS      ANTERIOR TIBIAL not palpable  not palpable      Gastrointestinal: soft, NTND, -G/R, - HSM, - masses, - CVAT B.  Musculoskeletal: M/S 5/5 throughout, Extremities without ischemic changes.  Neurologic: CN 2-12 intact, Pain and light touch intact in extremities are intact, Motor exam as listed above.  Non-Invasive Vascular Imaging  AAA Duplex (03/16/2014)  Previous size: 4.95 cm (Date: 08/18/2013)  Current size:  5.2 cm (Date: 03/16/2014)  Medical Decision Making  The patient is a 80 y.o. male who presents with asymptomatic AAA with slight increase in size.   Based on this patient's exam and diagnostic studies, and after  discussing with Dr. Donnetta Hutching, the patient will follow up in 6 months  with the following studies: CT chest/abdomen,  ABI's, and carotid Duplex, follow up with Dr. Donnetta Hutching.  The threshold for repair is AAA size > 5.5 cm, growth > 1 cm/yr, and symptomatic status.  I emphasized the importance of maximal medical management including strict control of blood pressure, blood glucose, and lipid levels, antiplatelet agents, obtaining regular exercise, and continued cessation of smoking.   The patient was given information about AAA including signs, symptoms, treatment, and how to minimize the risk of enlargement and rupture of aneurysms.    The patient was advised to call 911 should the patient experience sudden onset abdominal or back pain.   Thank you for allowing [place] to participate in this patient's care.  Clemon Chambers, RN, MSN, FNP-C [facility] [place] Office: [phone]  Clinic Physician: Early  03/16/2014, 9:45 AM

## 2014-06-22 ENCOUNTER — Encounter: Payer: Self-pay | Admitting: Cardiovascular Disease

## 2014-07-27 DIAGNOSIS — M109 Gout, unspecified: Secondary | ICD-10-CM | POA: Insufficient documentation

## 2014-09-15 ENCOUNTER — Other Ambulatory Visit: Payer: Self-pay | Admitting: Vascular Surgery

## 2014-09-15 LAB — BUN: BUN: 9 mg/dL (ref 6–23)

## 2014-09-15 LAB — CREATININE, SERUM: Creat: 0.84 mg/dL (ref 0.50–1.35)

## 2014-09-20 ENCOUNTER — Encounter: Payer: Self-pay | Admitting: Vascular Surgery

## 2014-09-21 ENCOUNTER — Ambulatory Visit (INDEPENDENT_AMBULATORY_CARE_PROVIDER_SITE_OTHER): Payer: Medicare Other | Admitting: Vascular Surgery

## 2014-09-21 ENCOUNTER — Ambulatory Visit (HOSPITAL_COMMUNITY)
Admission: RE | Admit: 2014-09-21 | Discharge: 2014-09-21 | Disposition: A | Discharge status: Home / Self Care | Payer: Medicare Other | Source: Ambulatory Visit | Attending: Vascular Surgery | Admitting: Vascular Surgery

## 2014-09-21 ENCOUNTER — Ambulatory Visit (INDEPENDENT_AMBULATORY_CARE_PROVIDER_SITE_OTHER)
Admission: RE | Admit: 2014-09-21 | Discharge: 2014-09-21 | Disposition: A | Discharge status: Home / Self Care | Payer: Medicare Other | Source: Ambulatory Visit | Attending: Vascular Surgery | Admitting: Vascular Surgery

## 2014-09-21 ENCOUNTER — Ambulatory Visit
Admission: RE | Admit: 2014-09-21 | Discharge: 2014-09-21 | Disposition: A | Discharge status: Home / Self Care | Payer: Medicare Other | Source: Ambulatory Visit | Attending: Family | Admitting: Family

## 2014-09-21 ENCOUNTER — Encounter: Payer: Self-pay | Admitting: Vascular Surgery

## 2014-09-21 VITALS — BP 138/62 | HR 79 | Resp 20 | Ht 72.5 in | Wt 196.2 lb

## 2014-09-21 DIAGNOSIS — I714 Abdominal aortic aneurysm, without rupture, unspecified: Secondary | ICD-10-CM

## 2014-09-21 DIAGNOSIS — I6529 Occlusion and stenosis of unspecified carotid artery: Secondary | ICD-10-CM | POA: Diagnosis present

## 2014-09-21 MED ORDER — IOHEXOL 350 MG/ML SOLN
100.0000 mL | Freq: Once | INTRAVENOUS | Status: AC | PRN
  Administered 2014-09-21: 100 mL via INTRAVENOUS

## 2014-09-21 NOTE — Progress Notes (Signed)
    Established Abdominal Aortic Aneurysm  History of Present Illness  The patient is a 80 y.o. (1931/10/25) male who presents with chief complaint: follow up for AAA.  Previous studies demonstrate an AAA, measuring 5.2 cm.  He was last seen in the office by [name] on 03/16/14. The patient does not have back or abdominal pain.  The patient is former smoker who quit 8 years ago.   The patient's PMH, PSH, SH, FamHx, Med, and Allergies are unchanged from 03/16/2014  He reports intermittent numbness and tingling of feet due to diabetic neuropathy. He denies intermittent claudication, rest pain and non-healing wounds of the lower extremities.  All other systems negative.    He has no previous cardiac history. He does have COPD.   Physical Examination  Filed Vitals:   09/21/14 1311  BP: 138/62  Pulse: 79  Resp: 20  Height: 6' 0.5" (1.842 m)  Weight: 196 lb 3.2 oz (88.996 kg)   Body mass index is 26.23 kg/(m^2).  General: A&O x 3, WD male in NAD  Pulmonary: Sym exp, good air movt, CTAB, no rales, rhonchi, & wheezing   Cardiac: RRR, Nl S1, S2, no Murmurs, rubs or gallops   Vascular: Vessel Right Left  Radial Palpable Palpable  Aorta Not palpable N/A  Femoral Palpable Palpable  Popliteal Not palpable Not palpable  PT Not palpable Palpable  DP Faintly palpable Palpable   Gastrointestinal: soft, NTND, -G/R, - HSM, - masses  Musculoskeletal: M/S 5/5 throughout. Extremities without ischemic changes   Neurologic: Pain and light touch intact in extremities. Motor exam as listed above  Non-Invasive Vascular Imaging  AAA Duplex (09/21/2014)  Previous size: 5.2 cm (Date: 03/16/14)  CT abdomen  Medical Decision Making  The patient is a 80 y.o. male who presents with: asymptomatic AAA with increasing size. His CT of the abdomen and pelvis revealed one outpouching segment of the infrarenal aorta with maximum transverse diameter of 6.3 cm. Measurement of his aneurysm, excluding  the outpouching section was 5.0 cm. There is circumferential thrombus in the aneurysmal segments. He also has arteriomegaly with his native aorta measuring 3 cm.   Based on this patient's exam and diagnostic studies, the patient is not a candidate for endovascular repair. This is due to presence of intramural thrombus and a large infrarenal neck preventing a sufficient seal of the graft proximally.  He has been offered traditional open aneurysm but will need to have cardiac clearance prior to surgery. He has history of colon resection for colon cancer.   Did discuss the option of a fenestrated graft. Dr. Donnetta Hutching will consult with Dr. Trula Slade about this possibility.  The patient will follow up after cardiac clearance for continued discussion.   [name], PA-C [name] Office: [phone] Pager: [phone]  09/21/2014, 3:26 PM   This patient was seen in conjunction with Dr. Donnetta Hutching   I have examined the patient, reviewed and agree with above. Has had expansion of this aneurysm size. Remains active. Will obtain cardiac clearance and discussed both aneurysm repair versus fenestrated graft repair  EARLY, TODD, MD 09/21/2014 5:10 PM

## 2014-09-28 ENCOUNTER — Encounter: Payer: Self-pay | Admitting: Internal Medicine

## 2014-10-01 ENCOUNTER — Ambulatory Visit (INDEPENDENT_AMBULATORY_CARE_PROVIDER_SITE_OTHER): Payer: Medicare Other | Admitting: Cardiovascular Disease

## 2014-10-01 ENCOUNTER — Encounter: Payer: Self-pay | Admitting: Cardiovascular Disease

## 2014-10-01 VITALS — BP 130/80 | HR 67 | Ht 72.0 in | Wt 196.8 lb

## 2014-10-01 DIAGNOSIS — I159 Secondary hypertension, unspecified: Secondary | ICD-10-CM

## 2014-10-01 DIAGNOSIS — R0602 Shortness of breath: Secondary | ICD-10-CM

## 2014-10-01 DIAGNOSIS — I714 Abdominal aortic aneurysm, without rupture, unspecified: Secondary | ICD-10-CM

## 2014-10-01 NOTE — Assessment & Plan Note (Signed)
David [name] presents for further evaluation prior to his surgery for his abdominal aortic aneurysm. He's not had any episodes of angina. He does have multiple risk factors for coronary artery disease including known vascular disease, diabetes mellitus, hypertension, hyperlipidemia. He also has a history of cigarette smoking.  He had  a normal stress Myoview study in 2007. We'll repeat a Lexiscan Myoview study. We'll get this in the [place] office . I will see him on an as needed basis assuming the myoview is normal .

## 2014-10-01 NOTE — Patient Instructions (Addendum)
[place]  Your caregiver has ordered a Stress Test with nuclear imaging. The purpose of this test is to evaluate the blood supply to your heart muscle. This procedure is referred to as a "Non-Invasive Stress Test." This is because other than having an IV started in your vein, nothing is inserted or "invades" your body. Cardiac stress tests are done to find areas of poor blood flow to the heart by determining the extent of coronary artery disease (CAD). Some patients exercise on a treadmill, which naturally increases the blood flow to your heart, while others who are  unable to walk on a treadmill due to physical limitations have a pharmacologic/chemical stress agent called Lexiscan . This medicine will mimic walking on a treadmill by temporarily increasing your coronary blood flow.   Please note: these test may take anywhere between 2-4 hours to complete   Date of Procedure:____Thursday, October 15_______________  Arrival Time for Procedure:_______8:30am___________________  How to prepare for your Myoview test:  1. Do not eat or drink after midnight 2. No caffeine for 24 hours prior to test 3. No smoking 24 hours prior to test. 4. Your medication may be taken with water.  If your doctor stopped a medication because of this test, do not take that medication. 5. Ladies, please do not wear dresses.  Skirts or pants are appropriate. Please wear a short sleeve shirt. 6. No perfume, cologne or lotion. 7. Wear comfortable walking shoes. No heels!   Call or return to clinic prn if these symptoms worsen or fail to improve as anticipated.

## 2014-10-01 NOTE — Assessment & Plan Note (Signed)
Continue current meds. BP is stable BP 130/80     Pulse 67  .

## 2014-10-01 NOTE — Progress Notes (Signed)
David [name] Date of Birth  1931/07/19       [facility] [address], [address], [place], [place]  [address]   [place], [place]  [address] [place]   Fax  [phone]     Fax [phone]  Problem List: 1. Abdominal aortic aneurysm 2. Diabetes mellitus- complicated by diabetic neuropathy 3. COPD 4. Hypothyroidism 5. Hypertension 6. Hyperlipidemia 7. History of colon cancer-status post hemicolectomy  History of Present Illness:  David [name] is a 80 yo man.  I have seen him in the past for a nuclear study.  He had a pre-op visit for colon cancer. He is here for pre-op evaluation prior to AAA surgery.  His AAA has grown rapidly since his previous visit with Dr. Donnetta Hutching.    As part of his preop evaluation for hemicolectomy  in 2006 /  2007  we had performed an echocardiogram on 10/03/2005. As abnormal left ventricular systolic function. He did have some diastolic dysfunction. He had mild mitral regurgitation, moderate tricuspid regurgitation with mild pulmonary hypertension with an estimated PA pressure of 41. He also had an adenosine Myoview study which revealed no evidence of ischemia.  He does have shortness of breath-particularly with exertion. Has known history of COPD.   He's never had episodes of chest discomfort.   Current Outpatient Prescriptions on File Prior to Visit  Medication Sig Dispense Refill  . acetaminophen (TYLENOL) 500 MG tablet Take 500 mg by mouth every 6 (six) hours as needed. For pain      . clopidogrel (PLAVIX) 75 MG tablet Take 75 mg by mouth daily.      . fluocinonide (LIDEX) 0.05 % external solution Apply 1 application topically 2 (two) times daily as needed. For irritation      . Fluticasone-Salmeterol (ADVAIR) 250-50 MCG/DOSE AEPB Inhale 1 puff into the lungs every 12 (twelve) hours.      Marland Kitchen ipratropium (ATROVENT) 0.02 % nebulizer solution Take 500 mcg by nebulization 2  (two) times daily.      . Ipratropium-Albuterol (COMBIVENT IN) Inhale 2 puffs into the lungs 3 (three) times daily.       Marland Kitchen levothyroxine (SYNTHROID, LEVOTHROID) 125 MCG tablet Take 125 mcg by mouth daily.      Marland Kitchen losartan-hydrochlorothiazide (HYZAAR) 100-25 MG per tablet Take 0.5 tablets by mouth daily.       . mirtazapine (REMERON) 15 MG tablet Take 15 mg by mouth at bedtime.      . niacin (NIASPAN) 500 MG CR tablet Take 500 mg by mouth at bedtime.      Marland Kitchen omeprazole (PRILOSEC) 20 MG capsule Take 20 mg by mouth daily.      . rosuvastatin (CRESTOR) 20 MG tablet Take 20 mg by mouth daily.      . sitaGLIPtin (JANUVIA) 100 MG tablet Take 50 mg by mouth daily.       Marland Kitchen triamcinolone cream (KENALOG) 0.1 % Apply 1 application topically 2 (two) times daily.      . Vitamin D, Ergocalciferol, (DRISDOL) 50000 UNITS CAPS Take 50,000 Units by mouth every 7 (seven) days.       No current facility-administered medications on file prior to visit.    No Known Allergies  Past Medical History  Diagnosis Date  . COPD (chronic obstructive pulmonary disease)   . Hypertension   . Gastroesophageal reflux disease   . Hyperlipidemia   . AAA (  abdominal aortic aneurysm)   . Thyroid disease   . Lung nodules   . Diabetes mellitus   . ED (erectile dysfunction)   . BPH (benign prostatic hyperplasia)   . CKD (chronic kidney disease), stage III   . Positional vertigo   . Osteopenia   . AAA (abdominal aortic aneurysm)   . Vitamin D deficiency   . Atherosclerosis     pt denies this  . Peripheral vascular disease   . Colon cancer   . Thyroid cancer   . Stroke   . Stroke   . Pneumonia June 2013  . AAA (abdominal aortic aneurysm)     Past Surgical History  Procedure Laterality Date  . Colon resection  2007  . Thyroidectomy  2008  . Septoplasty    . Colon surgery  2007    Resection    History  Smoking status  . Former Smoker -- 1.50 packs/day for 50 years  . Types: Cigarettes  . Quit date:  05/28/2005  Smokeless tobacco  . Never Used    History  Alcohol Use No    Family History  Problem Relation Age of Onset  . Stomach cancer Mother   . Heart failure Mother   . Breast cancer Mother   . Pneumonia Father   . Diabetes Paternal Uncle   . Diabetes Brother   . Heart disease Brother     Heart Disease before age 71,  AAA and  Carotid  . Heart attack Brother     Reviw of Systems:  Reviewed in the HPI.  All other systems are negative.  Physical Exam: Blood pressure 130/80, pulse 67, height 6' (1.829 m), weight 196 lb 12 oz (89.245 kg). Wt Readings from Last 3 Encounters:  10/01/14 196 lb 12 oz (89.245 kg)  09/21/14 196 lb 3.2 oz (88.996 kg)  03/16/14 200 lb (90.719 kg)     General: Well developed, well nourished, in no acute distress.  Head: Normocephalic, atraumatic, sclera non-icteric, mucus membranes are moist,   Neck: Supple. Carotids are 2 + without bruits. No JVD   Lungs: Clear   Heart: RR, normal S1S2  Abdomen: Soft, non-tender, non-distended with normal bowel sounds.  Msk:  Strength and tone are normal   Extremities: No clubbing or cyanosis. No edema.  Distal pedal pulses are 2+ and equal    Neuro: CN II - XII intact.  Alert and oriented X 3.   Psych:  Normal   ECG: Oct, 9 2015:  NSR at 68, no ST or T wave changes  Assessment / Plan:

## 2014-10-04 ENCOUNTER — Encounter: Payer: Self-pay | Admitting: Vascular Surgery

## 2014-10-05 ENCOUNTER — Ambulatory Visit: Payer: Medicare Other | Admitting: Vascular Surgery

## 2014-10-07 ENCOUNTER — Ambulatory Visit (HOSPITAL_COMMUNITY): Payer: Medicare Other | Attending: Cardiology | Admitting: Radiology

## 2014-10-07 VITALS — BP 139/66 | HR 58 | Ht 72.0 in | Wt 196.0 lb

## 2014-10-07 DIAGNOSIS — I10 Essential (primary) hypertension: Secondary | ICD-10-CM | POA: Diagnosis not present

## 2014-10-07 DIAGNOSIS — J449 Chronic obstructive pulmonary disease, unspecified: Secondary | ICD-10-CM | POA: Diagnosis not present

## 2014-10-07 DIAGNOSIS — I159 Secondary hypertension, unspecified: Secondary | ICD-10-CM

## 2014-10-07 DIAGNOSIS — R0602 Shortness of breath: Secondary | ICD-10-CM | POA: Diagnosis present

## 2014-10-07 DIAGNOSIS — E119 Type 2 diabetes mellitus without complications: Secondary | ICD-10-CM | POA: Insufficient documentation

## 2014-10-07 DIAGNOSIS — I714 Abdominal aortic aneurysm, without rupture, unspecified: Secondary | ICD-10-CM

## 2014-10-07 DIAGNOSIS — Z01810 Encounter for preprocedural cardiovascular examination: Secondary | ICD-10-CM

## 2014-10-07 MED ORDER — TECHNETIUM TC 99M SESTAMIBI GENERIC - CARDIOLITE
33.0000 | Freq: Once | INTRAVENOUS | Status: AC | PRN
  Administered 2014-10-07: 33 via INTRAVENOUS

## 2014-10-07 MED ORDER — REGADENOSON 0.4 MG/5ML IV SOLN
0.4000 mg | Freq: Once | INTRAVENOUS | Status: AC
Start: 2014-10-07 — End: 2014-10-07
  Administered 2014-10-07: 0.4 mg via INTRAVENOUS

## 2014-10-07 MED ORDER — TECHNETIUM TC 99M SESTAMIBI GENERIC - CARDIOLITE
11.0000 | Freq: Once | INTRAVENOUS | Status: AC | PRN
  Administered 2014-10-07: 11 via INTRAVENOUS

## 2014-10-07 NOTE — Progress Notes (Signed)
[facility] [address] [place], [place] [address] [phone]    Cardiology Nuclear Med Study  David [name] is a 80 y.o. male     MRN : [id number]     DOB: 1931/07/30  Procedure Date: 10/07/2014  Nuclear Med Background Indication for Stress Test:  Evaluation for Ischemia and Surgical Clearance- AAA Repair, Dr. Donnetta Hutching History:  COPD and MPI 2007 EF 63% Cardiac Risk Factors: Carotid Disease, CVA, Hypertension and NIDDM  Symptoms:  SOB   Nuclear Pre-Procedure Caffeine/Decaff Intake:  None NPO After: 10:00pm   Lungs:  clear O2 Sat: 95% on room air. IV 0.9% NS with Angio Cath:  22g  IV Site: R Hand  IV Started by:  Crissie Figures, RN  Chest Size (in):  42 Cup Size: n/a  Height: 6' (1.829 m)  Weight:  196 lb (88.905 kg)  BMI:  Body mass index is 26.58 kg/(m^2). Tech Comments:  N/A    Nuclear Med Study 1 or 2 day study: 1 day  Stress Test Type:  Lexiscan  Reading MD: N/A  Order Authorizing Provider:  Mertie Moores, MD  Resting Radionuclide: Technetium 73m Sestamibi  Resting Radionuclide Dose: 11.0 mCi   Stress Radionuclide:  Technetium 9m Sestamibi  Stress Radionuclide Dose: 33.0 mCi           Stress Protocol Rest HR: 58 Stress HR: 81  Rest BP: 139/66 Stress BP: 136/78  Exercise Time (min): n/a METS: n/a           Dose of Adenosine (mg):  n/a Dose of Lexiscan: 0.4 mg  Dose of Atropine (mg): n/a Dose of Dobutamine: n/a mcg/kg/min (at max HR)  Stress Test Technologist: Glade Lloyd, BS-ES  Nuclear Technologist:  Earl Many, CNMT     Rest Procedure:  Myocardial perfusion imaging was performed at rest 45 minutes following the intravenous administration of Technetium 3m Sestamibi. Rest ECG: NSR - Normal EKG  Stress Procedure:  The patient received IV Lexiscan 0.4 mg over 15-seconds.  Technetium 71m Sestamibi injected at 30-seconds.  Quantitative spect images were obtained after a 45 minute delay.  During the infusion Lexiscan  the patient complained of chest pressure and nausea.  These symptoms resolved in recovery.  Stress ECG: No significant change from baseline ECG  QPS Raw Data Images:  Normal; no motion artifact; normal heart/lung ratio. Stress Images:  Decreased uptake in the basal, mid, apical inferior wall and mid inferolateral wall.  Rest Images:  Decreased uptake in the basal, mid, apical inferior wall and mid inferolateral wall.  Subtraction (SDS):  No evidence of ischemia. (SDS 0) Transient Ischemic Dilatation (Normal <1.22):  0.93 Lung/Heart Ratio (Normal <0.45):  0.31  Quantitative Gated Spect Images QGS EDV:  94 ml QGS ESV:  36 ml  Impression Exercise Capacity:  Lexiscan with no exercise. BP Response:  Normal blood pressure response. Clinical Symptoms:  Mild chest pain/dyspnea. ECG Impression:  No significant ST segment change suggestive of ischemia. Comparison with Prior Nuclear Study: No previous nuclear study performed  Overall Impression:  Low risk stress nuclear study with midium size scar in the PDA territory and no ischemia. .  LV Ejection Fraction: 62%.  LV Wall Motion:  hypokinesis in the inferior wall.    Dorothy Spark 10/07/2014

## 2014-10-08 ENCOUNTER — Telehealth: Payer: Self-pay | Admitting: Cardiovascular Disease

## 2014-10-08 NOTE — Telephone Encounter (Signed)
Reviewed preliminary results with pt who is aware Dr Acie Fredrickson has not reviewed test at this time. He states understanding and will wait for a call back with Dr Elmarie Shiley recommendations.  He is waiting to schedule surgery.

## 2014-10-08 NOTE — Telephone Encounter (Signed)
New message ° ° ° ° °Want stress test results °

## 2014-10-18 ENCOUNTER — Encounter: Payer: Self-pay | Admitting: Vascular Surgery

## 2014-10-19 ENCOUNTER — Encounter: Payer: Self-pay | Admitting: Vascular Surgery

## 2014-10-19 ENCOUNTER — Ambulatory Visit (INDEPENDENT_AMBULATORY_CARE_PROVIDER_SITE_OTHER): Payer: Medicare Other | Admitting: Vascular Surgery

## 2014-10-19 VITALS — BP 126/75 | HR 66 | Resp 16 | Ht 72.5 in | Wt 198.5 lb

## 2014-10-19 DIAGNOSIS — I714 Abdominal aortic aneurysm, without rupture, unspecified: Secondary | ICD-10-CM

## 2014-10-19 NOTE — Progress Notes (Signed)
Here today for continued discussion of his 6.3 cm infrarenal aneurysm. He has had cardiac evaluation and clearance with a low risk Myoview study. He is here today with his wife. I discussed again the significance of his aneurysm and that he is not a candidate for standard stent graft repair. We had a long discussion regarding the option of fenestrated graft with the recent approval of this device. I did offer for referral to Dr. Trula Slade for continued discussion of this. I did discussion the recovery from open aneurysm repair and fillet he would be an acceptable candidate for this. He and his wife wish to proceed with open repair.  Past Medical History  Diagnosis Date  . COPD (chronic obstructive pulmonary disease)   . Hypertension   . Gastroesophageal reflux disease   . Hyperlipidemia   . AAA (abdominal aortic aneurysm)   . Thyroid disease   . Lung nodules   . Diabetes mellitus   . ED (erectile dysfunction)   . BPH (benign prostatic hyperplasia)   . CKD (chronic kidney disease), stage III   . Positional vertigo   . Osteopenia   . AAA (abdominal aortic aneurysm)   . Vitamin D deficiency   . Atherosclerosis     pt denies this  . Peripheral vascular disease   . Colon cancer   . Thyroid cancer   . Stroke   . Stroke   . Pneumonia June 2013  . AAA (abdominal aortic aneurysm)     History  Substance Use Topics  . Smoking status: Former Smoker -- 1.50 packs/day for 50 years    Types: Cigarettes    Quit date: 05/28/2005  . Smokeless tobacco: Never Used  . Alcohol Use: No    Family History  Problem Relation Age of Onset  . Stomach cancer Mother   . Heart failure Mother   . Breast cancer Mother   . Pneumonia Father   . Diabetes Paternal Uncle   . Diabetes Brother   . Heart disease Brother     Heart Disease before age 65,  AAA and  Carotid  . Heart attack Brother     No Known Allergies  Current outpatient prescriptions:acetaminophen (TYLENOL) 500 MG tablet, Take 500 mg by  mouth every 6 (six) hours as needed. For pain, Disp: , Rfl: ;  clopidogrel (PLAVIX) 75 MG tablet, Take 75 mg by mouth daily., Disp: , Rfl: ;  fluocinonide (LIDEX) 0.05 % external solution, Apply 1 application topically 2 (two) times daily as needed. For irritation, Disp: , Rfl:  Fluticasone-Salmeterol (ADVAIR) 250-50 MCG/DOSE AEPB, Inhale 1 puff into the lungs every 12 (twelve) hours., Disp: , Rfl: ;  ipratropium (ATROVENT) 0.02 % nebulizer solution, Take 500 mcg by nebulization 2 (two) times daily., Disp: , Rfl: ;  Ipratropium-Albuterol (COMBIVENT IN), Inhale 2 puffs into the lungs 3 (three) times daily. , Disp: , Rfl: ;  levothyroxine (SYNTHROID, LEVOTHROID) 125 MCG tablet, Take 125 mcg by mouth daily., Disp: , Rfl:  losartan-hydrochlorothiazide (HYZAAR) 100-25 MG per tablet, Take 0.5 tablets by mouth daily. , Disp: , Rfl: ;  mirtazapine (REMERON) 15 MG tablet, Take 15 mg by mouth at bedtime., Disp: , Rfl: ;  niacin (NIASPAN) 500 MG CR tablet, Take 500 mg by mouth at bedtime., Disp: , Rfl: ;  omeprazole (PRILOSEC) 20 MG capsule, Take 20 mg by mouth daily., Disp: , Rfl: ;  rosuvastatin (CRESTOR) 20 MG tablet, Take 20 mg by mouth daily., Disp: , Rfl:  sitaGLIPtin (JANUVIA) 100 MG tablet, Take  50 mg by mouth daily. , Disp: , Rfl: ;  triamcinolone cream (KENALOG) 0.1 %, Apply 1 application topically 2 (two) times daily., Disp: , Rfl: ;  Vitamin D, Ergocalciferol, (DRISDOL) 50000 UNITS CAPS, Take 50,000 Units by mouth every 7 (seven) days., Disp: , Rfl:   BP 126/75  Pulse 66  Resp 16  Ht 6' 0.5" (1.842 m)  Wt 198 lb 8 oz (90.039 kg)  BMI 26.54 kg/m2  Body mass index is 26.54 kg/(m^2).       Physical exam is unchanged. Moderate obesity with no tenderness.  We will proceed with open aneurysm repair on 11/16. Understands expected 1-2 day hospitalization in intensive care and total hospitalization expected 5-7 days. Explained less than 5% expected mortality from procedure in 10-15% morbidity. They are  willing to proceed as scheduled. He will stop his Plavix 1 week prior to surgery and will take a bottle of magnesium citrate the night before surgery

## 2014-10-20 ENCOUNTER — Other Ambulatory Visit: Payer: Self-pay | Admitting: *Deleted

## 2014-10-30 NOTE — Pre-Procedure Instructions (Signed)
SPARROW [facility]  10/30/2014   Your procedure is scheduled on:  November 16  Report to [facility] Admitting at 05:30 AM.  Call this number if you have problems the morning of surgery: [phone]   Remember:   Do not eat food or drink liquids after midnight.   Take these medicines the morning of surgery with A SIP OF WATER: Advair, Atrovent, Combivent, Duoneb (if needed), Levothyroxine, Omeprazole,    STOP Plavix, Vitamin D, Niacin today   STOP/ Do not take Aspirin, Aleve, Naproxen, Advil, Ibuprofen, Motrin, Vitamins, Herbs, or Supplements starting today   Do not wear jewelry, make-up or nail polish.  Do not wear lotions, powders, or perfumes. You may wear deodorant.  Do not shave 48 hours prior to surgery. Men may shave face and neck.  Do not bring valuables to the hospital.  [facility] is not responsible for any belongings or valuables.               Contacts, dentures or bridgework may not be worn into surgery.  Leave suitcase in the car. After surgery it may be brought to your room.  For patients admitted to the hospital, discharge time is determined by your treatment team.               Special Instructions: [facility] - Preparing for Surgery  Before surgery, you can play an important role.  Because skin is not sterile, your skin needs to be as free of germs as possible.  You can reduce the number of germs on you skin by washing with CHG (chlorahexidine gluconate) soap before surgery.  CHG is an antiseptic cleaner which kills germs and bonds with the skin to continue killing germs even after washing.  Please DO NOT use if you have an allergy to CHG or antibacterial soaps.  If your skin becomes reddened/irritated stop using the CHG and inform your nurse when you arrive at Short Stay.  Do not shave (including legs and underarms) for at least 48 hours prior to the first CHG shower.  You may shave your face.  Please follow these instructions carefully:   1.   Shower with CHG Soap the night before surgery and the morning of Surgery.  2.  If you choose to wash your hair, wash your hair first as usual with your normal shampoo.  3.  After you shampoo, rinse your hair and body thoroughly to remove the shampoo.  4.  Use CHG as you would any other liquid soap.  You can apply CHG directly to the skin and wash gently with scrungie or a clean washcloth.  5.  Apply the CHG Soap to your body ONLY FROM THE NECK DOWN.  Do not use on open wounds or open sores.  Avoid contact with your eyes, ears, mouth and genitals (private parts).  Wash genitals (private parts) with your normal soap.  6.  Wash thoroughly, paying special attention to the area where your surgery will be performed.  7.  Thoroughly rinse your body with warm water from the neck down.  8.  DO NOT shower/wash with your normal soap after using and rinsing off the CHG Soap.  9.  Pat yourself dry with a clean towel.            10.  Wear clean pajamas.            11.  Place clean sheets on your bed the night of your first shower and do not  sleep with pets.  Day of Surgery  Do not apply any lotions the morning of surgery.  Please wear clean clothes to the hospital/surgery center.     Please read over the following fact sheets that you were given: Pain Booklet, Coughing and Deep Breathing, Blood Transfusion Information and Surgical Site Infection Prevention

## 2014-11-01 ENCOUNTER — Encounter (HOSPITAL_COMMUNITY)
Admission: RE | Admit: 2014-11-01 | Discharge: 2014-11-01 | Disposition: A | Discharge status: Home / Self Care | Payer: Medicare Other | Source: Ambulatory Visit | Attending: Vascular Surgery | Admitting: Vascular Surgery

## 2014-11-01 ENCOUNTER — Encounter (HOSPITAL_COMMUNITY): Payer: Self-pay

## 2014-11-01 ENCOUNTER — Encounter (HOSPITAL_COMMUNITY)
Admission: RE | Admit: 2014-11-01 | Discharge: 2014-11-01 | Disposition: A | Discharge status: Home / Self Care | Payer: Medicare Other | Source: Ambulatory Visit | Attending: Anesthesiology | Admitting: Anesthesiology

## 2014-11-01 DIAGNOSIS — Z01812 Encounter for preprocedural laboratory examination: Secondary | ICD-10-CM | POA: Insufficient documentation

## 2014-11-01 DIAGNOSIS — R0602 Shortness of breath: Secondary | ICD-10-CM

## 2014-11-01 DIAGNOSIS — I714 Abdominal aortic aneurysm, without rupture: Secondary | ICD-10-CM | POA: Diagnosis not present

## 2014-11-01 DIAGNOSIS — Z01818 Encounter for other preprocedural examination: Secondary | ICD-10-CM | POA: Insufficient documentation

## 2014-11-01 HISTORY — DX: Shortness of breath: R06.02

## 2014-11-01 HISTORY — DX: Type 2 diabetes mellitus without complications: E11.9

## 2014-11-01 LAB — COMPREHENSIVE METABOLIC PANEL
ALT: 30 U/L (ref 0–53)
AST: 21 U/L (ref 0–37)
Albumin: 4.1 g/dL (ref 3.5–5.2)
Alkaline Phosphatase: 95 U/L (ref 39–117)
Anion gap: 17 — ABNORMAL HIGH (ref 5–15)
BILIRUBIN TOTAL: 0.4 mg/dL (ref 0.3–1.2)
BUN: 23 mg/dL (ref 6–23)
CO2: 20 meq/L (ref 19–32)
CREATININE: 1.4 mg/dL — AB (ref 0.50–1.35)
Calcium: 9.4 mg/dL (ref 8.4–10.5)
Chloride: 105 mEq/L (ref 96–112)
GFR, EST AFRICAN AMERICAN: 53 mL/min — AB (ref >90)
GFR, EST NON AFRICAN AMERICAN: 46 mL/min — AB (ref >90)
GLUCOSE: 149 mg/dL — AB (ref 70–99)
Potassium: 4.3 mEq/L (ref 3.7–5.3)
Sodium: 142 mEq/L (ref 137–147)
Total Protein: 7.3 g/dL (ref 6.0–8.3)

## 2014-11-01 LAB — BLOOD GAS, ARTERIAL
ACID-BASE DEFICIT: 2.1 mmol/L — AB (ref 0.0–2.0)
BICARBONATE: 21.7 meq/L (ref 20.0–24.0)
Drawn by: 42180
FIO2: 0.21 %
O2 SAT: 97.3 %
PO2 ART: 86.8 mmHg (ref 80.0–100.0)
Patient temperature: 98.6
TCO2: 22.7 mmol/L (ref 0–100)
pCO2 arterial: 33.6 mmHg — ABNORMAL LOW (ref 35.0–45.0)
pH, Arterial: 7.425 (ref 7.350–7.450)

## 2014-11-01 LAB — CBC
HEMATOCRIT: 43.7 % (ref 39.0–52.0)
HEMOGLOBIN: 15.3 g/dL (ref 13.0–17.0)
MCH: 32.4 pg (ref 26.0–34.0)
MCHC: 35 g/dL (ref 30.0–36.0)
MCV: 92.6 fL (ref 78.0–100.0)
Platelets: 179 10*3/uL (ref 150–400)
RBC: 4.72 MIL/uL (ref 4.22–5.81)
RDW: 13.7 % (ref 11.5–15.5)
WBC: 6.3 10*3/uL (ref 4.0–10.5)

## 2014-11-01 LAB — URINALYSIS, ROUTINE W REFLEX MICROSCOPIC
BILIRUBIN URINE: NEGATIVE
Glucose, UA: NEGATIVE mg/dL
Hgb urine dipstick: NEGATIVE
Ketones, ur: NEGATIVE mg/dL
LEUKOCYTES UA: NEGATIVE
NITRITE: NEGATIVE
PH: 6 (ref 5.0–8.0)
Protein, ur: NEGATIVE mg/dL
Specific Gravity, Urine: 1.016 (ref 1.005–1.030)
Urobilinogen, UA: 0.2 mg/dL (ref 0.0–1.0)

## 2014-11-01 LAB — PROTIME-INR
INR: 1.05 (ref 0.00–1.49)
PROTHROMBIN TIME: 13.8 s (ref 11.6–15.2)

## 2014-11-01 LAB — SURGICAL PCR SCREEN
MRSA, PCR: NEGATIVE
Staphylococcus aureus: NEGATIVE

## 2014-11-01 LAB — ABO/RH: ABO/RH(D): O POS

## 2014-11-01 LAB — APTT: APTT: 33 s (ref 24–37)

## 2014-11-01 NOTE — Progress Notes (Addendum)
Anesthesia Chart Review:  Pt is 80 year old male scheduled for  AAA repair on 11/08/2014 with Dr. Donnetta Hutching.   PMH: HTN, CVA, COPD, CKD, DM, AAA, PVD, thyroid cancer, colon cancer  Medications include: plavix, losartan/hctz, sitagliptin, rosuvastatin, advair, atrovent, duoneb, combivent, mirtazapine, levothyroxine  Preoperative labs reviewed.    Chest x-ray reviewed.  COPD. There is no evidence of pneumonia nor CHF nor other acute cardiopulmonary disease. There is evidence of previous granulomatous infection which is stable.  Pt reported SOB in PAT that is his usual, baseline SOB. Pt with hx COPD.   EKG 10/01/2014: NSR  Nuclear stress test 10/07/2014: Low risk stress nuclear study with midium size scar in the PDA territory and no ischemia.  2D echo 06/14/2012: - Left ventricle: The cavity size was normal. Wall thickness was increased in a pattern of mild LVH. Systolic function was normal. The estimated ejection fraction was in the range of 60% to 65%. Regional wall motion abnormalities cannot be excluded. Doppler parameters are consistent with abnormal left ventricular relaxation (grade 1 diastolic dysfunction). - Aortic valve: Mild regurgitation. - Mitral valve: Mild regurgitation. - Right ventricle: The cavity size was mildly dilated. Wallthickness was normal. - Right atrium: The atrium was mildly dilated. - Pulmonary arteries: Systolic pressure was mildly to moderately increased. PA peak pressure: 64mm Hg (S).  Carotid duplex US 09/21/2014: B ICA stenosis of <40%  Saw Dr. Acie Fredrickson for pre-op evaluation on 10/01/2014 and has had low risk myoview.   If no changes, I anticipate pt can proceed with surgery as scheduled.   Willeen Cass, FNP-BC [facility] Short Stay Surgical Center/Anesthesiology Phone: [phone] 11/01/2014 5:01 PM  Addendum: I did have anesthesiologist Dr. Conrad Houghton review stress test results which had scar and inferior hypokinesis but no ischemia.  Dr. Acie Fredrickson felt report was  low risk.  Confirmed with [facility] RN Zigmund Daniel that Dr. Donnetta Hutching reported that he had received cardiac clearance from Dr. Acie Fredrickson. As above, anticipate he can proceed if no changes.  George Hugh [facility] Short Stay Center/Anesthesiology Phone [phone] 11/02/2014 2:59 PM

## 2014-11-01 NOTE — Progress Notes (Signed)
Patient with shortness of breath during PAT visit reports that he is no more short of breath than normal. Dr. Joylene Draft is patient's PCP. Reports that last pulmonologist visit was before surgery "many" years ago.

## 2014-11-07 MED ORDER — DEXTROSE 5 % IV SOLN
1.5000 g | INTRAVENOUS | Status: AC
  Administered 2014-11-08: 1.5 g via INTRAVENOUS
  Filled 2014-11-07: qty 1.5

## 2014-11-08 ENCOUNTER — Encounter (HOSPITAL_COMMUNITY)
Admission: RE | Disposition: A | Discharge status: Home Health | Payer: Self-pay | Source: Ambulatory Visit | Attending: Vascular Surgery

## 2014-11-08 ENCOUNTER — Inpatient Hospital Stay (HOSPITAL_COMMUNITY): Payer: Medicare Other

## 2014-11-08 ENCOUNTER — Inpatient Hospital Stay (HOSPITAL_COMMUNITY)
Admission: RE | Admit: 2014-11-08 | Discharge: 2014-11-12 | DRG: 269 | Disposition: A | Discharge status: Home Health | Payer: Medicare Other | Source: Ambulatory Visit | Attending: Vascular Surgery | Admitting: Vascular Surgery

## 2014-11-08 ENCOUNTER — Inpatient Hospital Stay (HOSPITAL_COMMUNITY): Payer: Medicare Other | Admitting: Vascular Surgery

## 2014-11-08 ENCOUNTER — Inpatient Hospital Stay (HOSPITAL_COMMUNITY): Payer: Medicare Other | Admitting: Anesthesiology

## 2014-11-08 ENCOUNTER — Encounter (HOSPITAL_COMMUNITY): Payer: Self-pay | Admitting: *Deleted

## 2014-11-08 DIAGNOSIS — Z85850 Personal history of malignant neoplasm of thyroid: Secondary | ICD-10-CM

## 2014-11-08 DIAGNOSIS — Z79899 Other long term (current) drug therapy: Secondary | ICD-10-CM

## 2014-11-08 DIAGNOSIS — I129 Hypertensive chronic kidney disease with stage 1 through stage 4 chronic kidney disease, or unspecified chronic kidney disease: Secondary | ICD-10-CM | POA: Diagnosis present

## 2014-11-08 DIAGNOSIS — J449 Chronic obstructive pulmonary disease, unspecified: Secondary | ICD-10-CM | POA: Diagnosis present

## 2014-11-08 DIAGNOSIS — I714 Abdominal aortic aneurysm, without rupture, unspecified: Secondary | ICD-10-CM | POA: Diagnosis present

## 2014-11-08 DIAGNOSIS — E785 Hyperlipidemia, unspecified: Secondary | ICD-10-CM | POA: Diagnosis present

## 2014-11-08 DIAGNOSIS — I739 Peripheral vascular disease, unspecified: Secondary | ICD-10-CM | POA: Diagnosis present

## 2014-11-08 DIAGNOSIS — Z9889 Other specified postprocedural states: Secondary | ICD-10-CM

## 2014-11-08 DIAGNOSIS — Z8673 Personal history of transient ischemic attack (TIA), and cerebral infarction without residual deficits: Secondary | ICD-10-CM

## 2014-11-08 DIAGNOSIS — N400 Enlarged prostate without lower urinary tract symptoms: Secondary | ICD-10-CM | POA: Diagnosis present

## 2014-11-08 DIAGNOSIS — Z7902 Long term (current) use of antithrombotics/antiplatelets: Secondary | ICD-10-CM

## 2014-11-08 DIAGNOSIS — N183 Chronic kidney disease, stage 3 (moderate): Secondary | ICD-10-CM | POA: Diagnosis present

## 2014-11-08 DIAGNOSIS — Z85038 Personal history of other malignant neoplasm of large intestine: Secondary | ICD-10-CM | POA: Diagnosis not present

## 2014-11-08 DIAGNOSIS — Z87891 Personal history of nicotine dependence: Secondary | ICD-10-CM | POA: Diagnosis not present

## 2014-11-08 DIAGNOSIS — K219 Gastro-esophageal reflux disease without esophagitis: Secondary | ICD-10-CM | POA: Diagnosis present

## 2014-11-08 DIAGNOSIS — E119 Type 2 diabetes mellitus without complications: Secondary | ICD-10-CM | POA: Diagnosis present

## 2014-11-08 DIAGNOSIS — Z8679 Personal history of other diseases of the circulatory system: Secondary | ICD-10-CM

## 2014-11-08 HISTORY — PX: ABDOMINAL AORTIC ANEURYSM REPAIR: SHX42

## 2014-11-08 LAB — BLOOD GAS, ARTERIAL
Acid-base deficit: 1.9 mmol/L (ref 0.0–2.0)
Bicarbonate: 23.1 mEq/L (ref 20.0–24.0)
O2 Saturation: 96.5 %
Patient temperature: 98.6
TCO2: 24.4 mmol/L (ref 0–100)
pCO2 arterial: 44.5 mmHg (ref 35.0–45.0)
pH, Arterial: 7.334 — ABNORMAL LOW (ref 7.350–7.450)
pO2, Arterial: 92.9 mmHg (ref 80.0–100.0)

## 2014-11-08 LAB — POCT I-STAT 7, (LYTES, BLD GAS, ICA,H+H)
ACID-BASE DEFICIT: 1 mmol/L (ref 0.0–2.0)
Acid-base deficit: 4 mmol/L — ABNORMAL HIGH (ref 0.0–2.0)
BICARBONATE: 24.2 meq/L — AB (ref 20.0–24.0)
BICARBONATE: 22.8 meq/L (ref 20.0–24.0)
Calcium, Ion: 1.21 mmol/L (ref 1.13–1.30)
Calcium, Ion: 1.17 mmol/L (ref 1.13–1.30)
HEMATOCRIT: 40 % (ref 39.0–52.0)
HEMATOCRIT: 35 % — AB (ref 39.0–52.0)
HEMOGLOBIN: 11.9 g/dL — AB (ref 13.0–17.0)
Hemoglobin: 13.6 g/dL (ref 13.0–17.0)
O2 Saturation: 99 %
O2 Saturation: 100 %
PCO2 ART: 45.9 mmHg — AB (ref 35.0–45.0)
PH ART: 7.293 — AB (ref 7.350–7.450)
Patient temperature: 35.6
Potassium: 3.6 mEq/L — ABNORMAL LOW (ref 3.7–5.3)
Potassium: 4.7 mEq/L (ref 3.7–5.3)
SODIUM: 135 meq/L — AB (ref 137–147)
Sodium: 139 mEq/L (ref 137–147)
TCO2: 25 mmol/L (ref 0–100)
TCO2: 24 mmol/L (ref 0–100)
pCO2 arterial: 38.6 mmHg (ref 35.0–45.0)
pH, Arterial: 7.4 (ref 7.350–7.450)
pO2, Arterial: 122 mmHg — ABNORMAL HIGH (ref 80.0–100.0)
pO2, Arterial: 432 mmHg — ABNORMAL HIGH (ref 80.0–100.0)

## 2014-11-08 LAB — BASIC METABOLIC PANEL
Anion gap: 14 (ref 5–15)
BUN: 25 mg/dL — ABNORMAL HIGH (ref 6–23)
CO2: 22 mEq/L (ref 19–32)
Calcium: 8.4 mg/dL (ref 8.4–10.5)
Chloride: 102 mEq/L (ref 96–112)
Creatinine, Ser: 1.63 mg/dL — ABNORMAL HIGH (ref 0.50–1.35)
GFR calc Af Amer: 44 mL/min — ABNORMAL LOW (ref >90)
GFR calc non Af Amer: 38 mL/min — ABNORMAL LOW (ref >90)
Glucose, Bld: 234 mg/dL — ABNORMAL HIGH (ref 70–99)
Potassium: 5 mEq/L (ref 3.7–5.3)
Sodium: 138 mEq/L (ref 137–147)

## 2014-11-08 LAB — GLUCOSE, CAPILLARY
GLUCOSE-CAPILLARY: 229 mg/dL — AB (ref 70–99)
GLUCOSE-CAPILLARY: 268 mg/dL — AB (ref 70–99)
Glucose-Capillary: 166 mg/dL — ABNORMAL HIGH (ref 70–99)
Glucose-Capillary: 276 mg/dL — ABNORMAL HIGH (ref 70–99)

## 2014-11-08 LAB — HEMOGLOBIN A1C
Hgb A1c MFr Bld: 7.2 % — ABNORMAL HIGH (ref <5.7)
MEAN PLASMA GLUCOSE: 160 mg/dL — AB (ref <117)

## 2014-11-08 LAB — CBC
HCT: 41.4 % (ref 39.0–52.0)
HEMOGLOBIN: 14.2 g/dL (ref 13.0–17.0)
MCH: 31.9 pg (ref 26.0–34.0)
MCHC: 34.3 g/dL (ref 30.0–36.0)
MCV: 93 fL (ref 78.0–100.0)
Platelets: 199 10*3/uL (ref 150–400)
RBC: 4.45 MIL/uL (ref 4.22–5.81)
RDW: 13.8 % (ref 11.5–15.5)
WBC: 18.1 10*3/uL — ABNORMAL HIGH (ref 4.0–10.5)

## 2014-11-08 LAB — PREPARE RBC (CROSSMATCH)

## 2014-11-08 LAB — PROTIME-INR
INR: 1.32 (ref 0.00–1.49)
Prothrombin Time: 16.5 seconds — ABNORMAL HIGH (ref 11.6–15.2)

## 2014-11-08 LAB — POCT I-STAT GLUCOSE
GLUCOSE: 214 mg/dL — AB (ref 70–99)
OPERATOR ID: 427381

## 2014-11-08 LAB — APTT: aPTT: 34 seconds (ref 24–37)

## 2014-11-08 LAB — MAGNESIUM: Magnesium: 2 mg/dL (ref 1.5–2.5)

## 2014-11-08 SURGERY — ANEURYSM ABDOMINAL AORTIC REPAIR
Anesthesia: General | Site: Abdomen

## 2014-11-08 MED ORDER — PROPOFOL 10 MG/ML IV BOLUS
INTRAVENOUS | Status: DC | PRN
  Administered 2014-11-08: 150 mg via INTRAVENOUS

## 2014-11-08 MED ORDER — ROCURONIUM BROMIDE 100 MG/10ML IV SOLN
INTRAVENOUS | Status: DC | PRN
  Administered 2014-11-08: 50 mg via INTRAVENOUS

## 2014-11-08 MED ORDER — VECURONIUM BROMIDE 10 MG IV SOLR
INTRAVENOUS | Status: DC | PRN
  Administered 2014-11-08: 1 mg via INTRAVENOUS
  Administered 2014-11-08: 2 mg via INTRAVENOUS
  Administered 2014-11-08: 1 mg via INTRAVENOUS

## 2014-11-08 MED ORDER — PHENYLEPHRINE HCL 10 MG/ML IJ SOLN
INTRAMUSCULAR | Status: DC | PRN
  Administered 2014-11-08 (×2): 180 ug via INTRAVENOUS

## 2014-11-08 MED ORDER — FENTANYL CITRATE 0.05 MG/ML IJ SOLN
INTRAMUSCULAR | Status: AC
  Filled 2014-11-08: qty 5

## 2014-11-08 MED ORDER — DOPAMINE-DEXTROSE 3.2-5 MG/ML-% IV SOLN
3.0000 ug/kg/min | INTRAVENOUS | Status: DC | PRN
  Filled 2014-11-08: qty 250

## 2014-11-08 MED ORDER — GUAIFENESIN-DM 100-10 MG/5ML PO SYRP: 15.0000 mL | ORAL_SOLUTION | ORAL | Status: DC | PRN

## 2014-11-08 MED ORDER — CHLORHEXIDINE GLUCONATE 4 % EX LIQD
60.0000 mL | Freq: Once | CUTANEOUS | Status: DC
  Filled 2014-11-08: qty 60

## 2014-11-08 MED ORDER — METOPROLOL TARTRATE 1 MG/ML IV SOLN
2.0000 mg | INTRAVENOUS | Status: DC | PRN
  Administered 2014-11-08: 2 mg via INTRAVENOUS

## 2014-11-08 MED ORDER — HYDROMORPHONE HCL 1 MG/ML IJ SOLN
INTRAMUSCULAR | Status: AC
  Filled 2014-11-08: qty 1

## 2014-11-08 MED ORDER — SODIUM CHLORIDE 0.9 % IR SOLN
Status: DC | PRN
  Administered 2014-11-08: 500 mL

## 2014-11-08 MED ORDER — TRIAMCINOLONE ACETONIDE 0.1 % EX CREA
1.0000 "application " | TOPICAL_CREAM | Freq: Two times a day (BID) | CUTANEOUS | Status: DC
  Administered 2014-11-09: 1 via TOPICAL
  Filled 2014-11-08: qty 15

## 2014-11-08 MED ORDER — ENOXAPARIN SODIUM 30 MG/0.3ML ~~LOC~~ SOLN
30.0000 mg | SUBCUTANEOUS | Status: DC
  Administered 2014-11-09 – 2014-11-12 (×4): 30 mg via SUBCUTANEOUS
  Filled 2014-11-08 (×4): qty 0.3

## 2014-11-08 MED ORDER — ONDANSETRON HCL 4 MG/2ML IJ SOLN
INTRAMUSCULAR | Status: DC | PRN
  Administered 2014-11-08: 4 mg via INTRAVENOUS

## 2014-11-08 MED ORDER — IPRATROPIUM-ALBUTEROL 0.5-2.5 (3) MG/3ML IN SOLN: 3.0000 mL | Freq: Two times a day (BID) | RESPIRATORY_TRACT | Status: DC | PRN

## 2014-11-08 MED ORDER — SODIUM BICARBONATE 8.4 % IV SOLN
INTRAVENOUS | Status: DC | PRN
  Administered 2014-11-08: 50 meq via INTRAVENOUS

## 2014-11-08 MED ORDER — POTASSIUM CHLORIDE IN NACL 20-0.9 MEQ/L-% IV SOLN
INTRAVENOUS | Status: DC
  Administered 2014-11-08 – 2014-11-11 (×4): via INTRAVENOUS
  Filled 2014-11-08 (×8): qty 1000

## 2014-11-08 MED ORDER — IPRATROPIUM-ALBUTEROL 0.5-2.5 (3) MG/3ML IN SOLN
3.0000 mL | Freq: Three times a day (TID) | RESPIRATORY_TRACT | Status: DC
  Administered 2014-11-09 – 2014-11-12 (×10): 3 mL via RESPIRATORY_TRACT
  Filled 2014-11-08 (×12): qty 3

## 2014-11-08 MED ORDER — 0.9 % SODIUM CHLORIDE (POUR BTL) OPTIME
TOPICAL | Status: DC | PRN
  Administered 2014-11-08 (×2): 1000 mL

## 2014-11-08 MED ORDER — GLYCOPYRROLATE 0.2 MG/ML IJ SOLN
INTRAMUSCULAR | Status: AC
  Filled 2014-11-08: qty 3

## 2014-11-08 MED ORDER — METOPROLOL TARTRATE 1 MG/ML IV SOLN
INTRAVENOUS | Status: AC
  Filled 2014-11-08: qty 5

## 2014-11-08 MED ORDER — CETYLPYRIDINIUM CHLORIDE 0.05 % MT LIQD
7.0000 mL | Freq: Two times a day (BID) | OROMUCOSAL | Status: DC
  Administered 2014-11-08 – 2014-11-12 (×6): 7 mL via OROMUCOSAL

## 2014-11-08 MED ORDER — ONDANSETRON HCL 4 MG/2ML IJ SOLN
4.0000 mg | Freq: Four times a day (QID) | INTRAMUSCULAR | Status: DC | PRN
  Administered 2014-11-08 – 2014-11-09 (×3): 4 mg via INTRAVENOUS
  Filled 2014-11-08 (×3): qty 2

## 2014-11-08 MED ORDER — ONDANSETRON HCL 4 MG/2ML IJ SOLN
INTRAMUSCULAR | Status: AC
  Filled 2014-11-08: qty 2

## 2014-11-08 MED ORDER — FENTANYL CITRATE 0.05 MG/ML IJ SOLN
INTRAMUSCULAR | Status: DC | PRN
  Administered 2014-11-08: 100 ug via INTRAVENOUS
  Administered 2014-11-08 (×3): 50 ug via INTRAVENOUS
  Administered 2014-11-08: 200 ug via INTRAVENOUS
  Administered 2014-11-08: 50 ug via INTRAVENOUS
  Administered 2014-11-08 (×2): 100 ug via INTRAVENOUS
  Administered 2014-11-08: 50 ug via INTRAVENOUS

## 2014-11-08 MED ORDER — DEXTROSE 5 % IV SOLN
1.5000 g | Freq: Two times a day (BID) | INTRAVENOUS | Status: AC
  Administered 2014-11-08 – 2014-11-09 (×2): 1.5 g via INTRAVENOUS
  Filled 2014-11-08 (×2): qty 1.5

## 2014-11-08 MED ORDER — ALUM & MAG HYDROXIDE-SIMETH 200-200-20 MG/5ML PO SUSP: 15.0000 mL | ORAL | Status: DC | PRN

## 2014-11-08 MED ORDER — HYDRALAZINE HCL 20 MG/ML IJ SOLN
INTRAMUSCULAR | Status: AC
  Filled 2014-11-08: qty 1

## 2014-11-08 MED ORDER — MIRTAZAPINE 15 MG PO TABS
15.0000 mg | ORAL_TABLET | Freq: Every day | ORAL | Status: DC
  Administered 2014-11-09 – 2014-11-11 (×3): 15 mg via ORAL
  Filled 2014-11-08 (×5): qty 1

## 2014-11-08 MED ORDER — MANNITOL 25 % IV SOLN
INTRAVENOUS | Status: DC | PRN
  Administered 2014-11-08: 25 g via INTRAVENOUS

## 2014-11-08 MED ORDER — PROTAMINE SULFATE 10 MG/ML IV SOLN
INTRAVENOUS | Status: DC | PRN
  Administered 2014-11-08: 50 mg via INTRAVENOUS

## 2014-11-08 MED ORDER — MIDAZOLAM HCL 5 MG/5ML IJ SOLN
INTRAMUSCULAR | Status: DC | PRN
  Administered 2014-11-08: 1 mg via INTRAVENOUS

## 2014-11-08 MED ORDER — MOMETASONE FURO-FORMOTEROL FUM 100-5 MCG/ACT IN AERO
2.0000 | INHALATION_SPRAY | Freq: Two times a day (BID) | RESPIRATORY_TRACT | Status: DC
  Administered 2014-11-09 – 2014-11-12 (×7): 2 via RESPIRATORY_TRACT
  Filled 2014-11-08: qty 8.8

## 2014-11-08 MED ORDER — ONDANSETRON HCL 4 MG/2ML IJ SOLN
4.0000 mg | Freq: Four times a day (QID) | INTRAMUSCULAR | Status: AC | PRN
  Administered 2014-11-08: 4 mg via INTRAVENOUS

## 2014-11-08 MED ORDER — CHLORHEXIDINE GLUCONATE 0.12 % MT SOLN
15.0000 mL | Freq: Two times a day (BID) | OROMUCOSAL | Status: DC
  Administered 2014-11-08 – 2014-11-11 (×7): 15 mL via OROMUCOSAL
  Filled 2014-11-08 (×11): qty 15

## 2014-11-08 MED ORDER — ALBUMIN HUMAN 5 % IV SOLN
INTRAVENOUS | Status: DC | PRN
Start: 2014-11-08 — End: 2014-11-08
  Administered 2014-11-08 (×2): via INTRAVENOUS

## 2014-11-08 MED ORDER — LACTATED RINGERS IV SOLN
INTRAVENOUS | Status: DC | PRN
Start: 2014-11-08 — End: 2014-11-08
  Administered 2014-11-08: 07:00:00 via INTRAVENOUS

## 2014-11-08 MED ORDER — IPRATROPIUM-ALBUTEROL 0.5-2.5 (3) MG/3ML IN SOLN: 3.0000 mL | Freq: Three times a day (TID) | RESPIRATORY_TRACT | Status: DC

## 2014-11-08 MED ORDER — NITROGLYCERIN 0.2 MG/ML ON CALL CATH LAB
INTRAVENOUS | Status: DC | PRN
  Administered 2014-11-08 (×3): 20 ug via INTRAVENOUS

## 2014-11-08 MED ORDER — POTASSIUM CHLORIDE CRYS ER 20 MEQ PO TBCR: 20.0000 meq | EXTENDED_RELEASE_TABLET | Freq: Every day | ORAL | Status: DC | PRN

## 2014-11-08 MED ORDER — KCL IN DEXTROSE-NACL 20-5-0.45 MEQ/L-%-% IV SOLN
INTRAVENOUS | Status: DC
  Administered 2014-11-08: 100 mL/h via INTRAVENOUS
  Filled 2014-11-08 (×3): qty 1000

## 2014-11-08 MED ORDER — SODIUM CHLORIDE 0.9 % IV SOLN: 500.0000 mL | Freq: Once | INTRAVENOUS | Status: AC | PRN

## 2014-11-08 MED ORDER — MAGNESIUM SULFATE 2 GM/50ML IV SOLN
2.0000 g | Freq: Every day | INTRAVENOUS | Status: DC | PRN
  Filled 2014-11-08: qty 50

## 2014-11-08 MED ORDER — MORPHINE SULFATE 2 MG/ML IJ SOLN
2.0000 mg | INTRAMUSCULAR | Status: DC | PRN
  Administered 2014-11-08 – 2014-11-09 (×3): 2 mg via INTRAVENOUS
  Administered 2014-11-09: 4 mg via INTRAVENOUS
  Administered 2014-11-09: 2 mg via INTRAVENOUS
  Administered 2014-11-10: 4 mg via INTRAVENOUS
  Administered 2014-11-10: 2 mg via INTRAVENOUS
  Filled 2014-11-08 (×5): qty 1
  Filled 2014-11-08 (×2): qty 2

## 2014-11-08 MED ORDER — INSULIN ASPART 100 UNIT/ML ~~LOC~~ SOLN
0.0000 [IU] | SUBCUTANEOUS | Status: DC
  Administered 2014-11-08: 3 [IU] via SUBCUTANEOUS
  Administered 2014-11-08 (×2): 5 [IU] via SUBCUTANEOUS
  Administered 2014-11-09: 1 [IU] via SUBCUTANEOUS
  Administered 2014-11-09 (×4): 2 [IU] via SUBCUTANEOUS
  Administered 2014-11-10 (×2): 1 [IU] via SUBCUTANEOUS
  Administered 2014-11-10: 2 [IU] via SUBCUTANEOUS
  Administered 2014-11-10: 1 [IU] via SUBCUTANEOUS
  Administered 2014-11-10: 2 [IU] via SUBCUTANEOUS
  Administered 2014-11-10 – 2014-11-11 (×5): 1 [IU] via SUBCUTANEOUS
  Administered 2014-11-11: 3 [IU] via SUBCUTANEOUS
  Administered 2014-11-11 – 2014-11-12 (×3): 1 [IU] via SUBCUTANEOUS

## 2014-11-08 MED ORDER — FLUOCINONIDE 0.05 % EX SOLN: 1.0000 "application " | Freq: Two times a day (BID) | CUTANEOUS | Status: DC | PRN

## 2014-11-08 MED ORDER — SODIUM CHLORIDE 0.9 % IV SOLN: INTRAVENOUS | Status: DC

## 2014-11-08 MED ORDER — PROPOFOL 10 MG/ML IV BOLUS
INTRAVENOUS | Status: AC
  Filled 2014-11-08: qty 20

## 2014-11-08 MED ORDER — GLYCOPYRROLATE 0.2 MG/ML IJ SOLN
INTRAMUSCULAR | Status: DC | PRN
  Administered 2014-11-08: .8 mg via INTRAVENOUS

## 2014-11-08 MED ORDER — MIDAZOLAM HCL 2 MG/2ML IJ SOLN
INTRAMUSCULAR | Status: AC
  Filled 2014-11-08: qty 2

## 2014-11-08 MED ORDER — PANTOPRAZOLE SODIUM 40 MG IV SOLR
40.0000 mg | INTRAVENOUS | Status: DC
  Administered 2014-11-08 – 2014-11-09 (×2): 40 mg via INTRAVENOUS
  Filled 2014-11-08 (×4): qty 40

## 2014-11-08 MED ORDER — HYDROMORPHONE HCL 1 MG/ML IJ SOLN
0.2500 mg | INTRAMUSCULAR | Status: DC | PRN
  Administered 2014-11-08 (×4): 0.5 mg via INTRAVENOUS

## 2014-11-08 MED ORDER — IPRATROPIUM-ALBUTEROL 18-103 MCG/ACT IN AERO: 2.0000 | INHALATION_SPRAY | Freq: Three times a day (TID) | RESPIRATORY_TRACT | Status: DC

## 2014-11-08 MED ORDER — ENOXAPARIN SODIUM 30 MG/0.3ML ~~LOC~~ SOLN: 30.0000 mg | SUBCUTANEOUS | Status: DC

## 2014-11-08 MED ORDER — LABETALOL HCL 5 MG/ML IV SOLN: 10.0000 mg | INTRAVENOUS | Status: DC | PRN

## 2014-11-08 MED ORDER — ACETAMINOPHEN 325 MG PO TABS: 325.0000 mg | ORAL_TABLET | ORAL | Status: DC | PRN

## 2014-11-08 MED ORDER — HEPARIN SODIUM (PORCINE) 1000 UNIT/ML IJ SOLN
INTRAMUSCULAR | Status: DC | PRN
  Administered 2014-11-08: 9000 [IU] via INTRAVENOUS

## 2014-11-08 MED ORDER — OXYCODONE HCL 5 MG/5ML PO SOLN: 5.0000 mg | Freq: Once | ORAL | Status: DC | PRN

## 2014-11-08 MED ORDER — LABETALOL HCL 5 MG/ML IV SOLN
5.0000 mg | INTRAVENOUS | Status: DC | PRN
  Administered 2014-11-08 (×2): 5 mg via INTRAVENOUS
  Administered 2014-11-08: 10 mg via INTRAVENOUS
  Filled 2014-11-08 (×4): qty 4

## 2014-11-08 MED ORDER — LABETALOL HCL 5 MG/ML IV SOLN
INTRAVENOUS | Status: DC | PRN
  Administered 2014-11-08 (×2): 5 mg via INTRAVENOUS

## 2014-11-08 MED ORDER — OXYCODONE HCL 5 MG PO TABS
5.0000 mg | ORAL_TABLET | ORAL | Status: DC | PRN
  Administered 2014-11-10 – 2014-11-12 (×5): 10 mg via ORAL
  Filled 2014-11-08 (×5): qty 2

## 2014-11-08 MED ORDER — DOCUSATE SODIUM 100 MG PO CAPS
100.0000 mg | ORAL_CAPSULE | Freq: Every day | ORAL | Status: DC
  Administered 2014-11-10: 100 mg via ORAL
  Filled 2014-11-08 (×3): qty 1

## 2014-11-08 MED ORDER — VITAMIN D (ERGOCALCIFEROL) 1.25 MG (50000 UNIT) PO CAPS
50000.0000 [IU] | ORAL_CAPSULE | ORAL | Status: DC
  Filled 2014-11-08: qty 1

## 2014-11-08 MED ORDER — PHENOL 1.4 % MT LIQD
1.0000 | OROMUCOSAL | Status: DC | PRN
Start: 2014-11-08 — End: 2014-11-12

## 2014-11-08 MED ORDER — VECURONIUM BROMIDE 10 MG IV SOLR
INTRAVENOUS | Status: AC
  Filled 2014-11-08: qty 10

## 2014-11-08 MED ORDER — ACETAMINOPHEN 650 MG RE SUPP: 325.0000 mg | RECTAL | Status: DC | PRN

## 2014-11-08 MED ORDER — NEOSTIGMINE METHYLSULFATE 10 MG/10ML IV SOLN
INTRAVENOUS | Status: DC | PRN
  Administered 2014-11-08: 5 mg via INTRAVENOUS

## 2014-11-08 MED ORDER — OXYCODONE HCL 5 MG PO TABS: 5.0000 mg | ORAL_TABLET | Freq: Once | ORAL | Status: DC | PRN

## 2014-11-08 MED ORDER — PHENYLEPHRINE HCL 10 MG/ML IJ SOLN
10.0000 mg | INTRAVENOUS | Status: DC | PRN
  Administered 2014-11-08: 20 ug/min via INTRAVENOUS

## 2014-11-08 MED ORDER — PROMETHAZINE HCL 25 MG/ML IJ SOLN
12.5000 mg | Freq: Four times a day (QID) | INTRAMUSCULAR | Status: DC | PRN
  Administered 2014-11-08 – 2014-11-09 (×4): 12.5 mg via INTRAVENOUS
  Filled 2014-11-08 (×4): qty 1

## 2014-11-08 MED ORDER — BISACODYL 10 MG RE SUPP: 10.0000 mg | Freq: Every day | RECTAL | Status: DC | PRN

## 2014-11-08 MED ORDER — HYDRALAZINE HCL 20 MG/ML IJ SOLN
5.0000 mg | INTRAMUSCULAR | Status: DC | PRN
  Administered 2014-11-08: 5 mg via INTRAVENOUS

## 2014-11-08 MED ORDER — ROCURONIUM BROMIDE 50 MG/5ML IV SOLN
INTRAVENOUS | Status: AC
  Filled 2014-11-08: qty 1

## 2014-11-08 MED ORDER — NEOSTIGMINE METHYLSULFATE 10 MG/10ML IV SOLN
INTRAVENOUS | Status: AC
  Filled 2014-11-08: qty 1

## 2014-11-08 MED ORDER — LACTATED RINGERS IV SOLN
INTRAVENOUS | Status: DC | PRN
  Administered 2014-11-08 (×2): via INTRAVENOUS

## 2014-11-08 MED ORDER — SODIUM CHLORIDE 0.9 % IV SOLN: Freq: Once | INTRAVENOUS | Status: DC

## 2014-11-08 SURGICAL SUPPLY — 56 items
BENZOIN TINCTURE PRP APPL 2/3 (GAUZE/BANDAGES/DRESSINGS) ×6 IMPLANT
CANISTER SUCTION 2500CC (MISCELLANEOUS) ×3 IMPLANT
CANNULA VESSEL 3MM 2 BLNT TIP (CANNULA) ×6 IMPLANT
CLIP LIGATING EXTRA MED SLVR (CLIP) ×6 IMPLANT
CLIP LIGATING EXTRA SM BLUE (MISCELLANEOUS) ×3 IMPLANT
CLOSURE STERI-STRIP 1/2X4 (GAUZE/BANDAGES/DRESSINGS) ×3
CLSR STERI-STRIP ANTIMIC 1/2X4 (GAUZE/BANDAGES/DRESSINGS) ×6 IMPLANT
COVER SURGICAL LIGHT HANDLE (MISCELLANEOUS) ×3 IMPLANT
DRAPE WARM FLUID 44X44 (DRAPE) ×3 IMPLANT
DRSG COVADERM 4X10 (GAUZE/BANDAGES/DRESSINGS) ×3 IMPLANT
DRSG COVADERM 4X8 (GAUZE/BANDAGES/DRESSINGS) ×3 IMPLANT
ELECT BLADE 4.0 EZ CLEAN MEGAD (MISCELLANEOUS)
ELECT REM PT RETURN 9FT ADLT (ELECTROSURGICAL) ×3
ELECTRODE BLDE 4.0 EZ CLN MEGD (MISCELLANEOUS) IMPLANT
ELECTRODE REM PT RTRN 9FT ADLT (ELECTROSURGICAL) ×1 IMPLANT
FELT TEFLON 4 X1 (Mesh General) ×3 IMPLANT
GLOVE BIO SURGEON STRL SZ 6.5 (GLOVE) ×8 IMPLANT
GLOVE BIO SURGEON STRL SZ7.5 (GLOVE) ×3 IMPLANT
GLOVE BIO SURGEONS STRL SZ 6.5 (GLOVE) ×4
GLOVE BIOGEL PI IND STRL 6.5 (GLOVE) ×6 IMPLANT
GLOVE BIOGEL PI IND STRL 8 (GLOVE) ×1 IMPLANT
GLOVE BIOGEL PI INDICATOR 6.5 (GLOVE) ×12
GLOVE BIOGEL PI INDICATOR 8 (GLOVE) ×2
GLOVE ECLIPSE 7.5 STRL STRAW (GLOVE) ×12 IMPLANT
GLOVE SS BIOGEL STRL SZ 7.5 (GLOVE) ×2 IMPLANT
GLOVE SUPERSENSE BIOGEL SZ 7.5 (GLOVE) ×4
GOWN STRL REUS W/ TWL LRG LVL3 (GOWN DISPOSABLE) ×7 IMPLANT
GOWN STRL REUS W/ TWL XL LVL3 (GOWN DISPOSABLE) ×3 IMPLANT
GOWN STRL REUS W/TWL LRG LVL3 (GOWN DISPOSABLE) ×14
GOWN STRL REUS W/TWL XL LVL3 (GOWN DISPOSABLE) ×6
GRAFT HEMASHIELD 24X30M (Vascular Products) ×3 IMPLANT
INSERT FOGARTY 61MM (MISCELLANEOUS) ×6 IMPLANT
INSERT FOGARTY SM (MISCELLANEOUS) ×6 IMPLANT
KIT BASIN OR (CUSTOM PROCEDURE TRAY) ×3 IMPLANT
KIT ROOM TURNOVER OR (KITS) ×3 IMPLANT
NS IRRIG 1000ML POUR BTL (IV SOLUTION) ×6 IMPLANT
PACK AORTA (CUSTOM PROCEDURE TRAY) ×3 IMPLANT
PAD ARMBOARD 7.5X6 YLW CONV (MISCELLANEOUS) ×6 IMPLANT
PROBE PENCIL 8 MHZ STRL DISP (MISCELLANEOUS) ×3 IMPLANT
SPONGE LAP 18X18 X RAY DECT (DISPOSABLE) IMPLANT
STAPLER VISISTAT 35W (STAPLE) IMPLANT
SUT ETHIBOND 5 LR DA (SUTURE) ×3 IMPLANT
SUT PDS AB 1 TP1 54 (SUTURE) ×6 IMPLANT
SUT PROLENE 3 0 SH 1 (SUTURE) ×6 IMPLANT
SUT PROLENE 3 0 SH1 36 (SUTURE) ×6 IMPLANT
SUT PROLENE 5 0 C 1 24 (SUTURE) ×9 IMPLANT
SUT PROLENE 5 0 C 1 36 (SUTURE) IMPLANT
SUT SILK 2 0 SH CR/8 (SUTURE) ×3 IMPLANT
SUT VIC AB 2-0 CT1 36 (SUTURE) ×6 IMPLANT
SUT VIC AB 3-0 SH 27 (SUTURE) ×2
SUT VIC AB 3-0 SH 27X BRD (SUTURE) ×1 IMPLANT
TOWEL BLUE STERILE X RAY DET (MISCELLANEOUS) ×6 IMPLANT
TOWEL OR 17X24 6PK STRL BLUE (TOWEL DISPOSABLE) ×6 IMPLANT
TOWEL OR 17X26 10 PK STRL BLUE (TOWEL DISPOSABLE) ×6 IMPLANT
TRAY FOLEY CATH 16FRSI W/METER (SET/KITS/TRAYS/PACK) ×3 IMPLANT
WATER STERILE IRR 1000ML POUR (IV SOLUTION) ×3 IMPLANT

## 2014-11-08 NOTE — Interval H&P Note (Signed)
History and Physical Interval Note:  11/08/2014 7:06 AM  David [name]  has presented today for surgery, with the diagnosis of abdominal aortic aneurysm  The various methods of treatment have been discussed with the patient and family. After consideration of risks, benefits and other options for treatment, the patient has consented to  Procedure(s): ANEURYSM ABDOMINAL AORTIC REPAIR (N/A) as a surgical intervention .  The patient's history has been reviewed, patient examined, no change in status, stable for surgery.  I have reviewed the patient's chart and labs.  Questions were answered to the patient's satisfaction.     [name]

## 2014-11-08 NOTE — Op Note (Signed)
OPERATIVE REPORT  DATE OF SURGERY: 11/08/2014  PATIENT: David [name], 80 y.o. male MRN: [id number]  DOB: 1931-03-22  PRE-OPERATIVE DIAGNOSIS: juxtarenal abdominal aortic aneurysm  POST-OPERATIVE DIAGNOSIS:  Same  PROCEDURE: open resection and grafting of juxtarenal abdominal aortic aneurysm with straight graft repair with a 24 mm Hemashield graft  SURGEON:  Curt Jews, M.D.  PHYSICIAN ASSISTANT: Dr. Scot Dock, Silva Bandy, PA  ANESTHESIA:  Gen.  EBL: 1000 ml  Total I/O In: 3386.7 [I.V.:2136.7; Blood:750; IV Piggyback:500] Out: 1615 [Urine:615; Blood:1000]  BLOOD ADMINISTERED: Cell Saver 750  DRAINS: none  SPECIMEN: none  COUNTS CORRECT:  YES  PLAN OF CARE: PACU extubated   PATIENT DISPOSITION:  PACU - hemodynamically stable  PROCEDURE DETAILS: The patient was taken to the operative room placed supine position with the area of the abdomen and both groins prepped and draped in sterile fashion. Incision was made from xiphoid to below the umbilicus and carried down through the midline fascia with left cautery. The patient had prior pubic surgery and they have Prolene suture was removed with the minimal scarring in the intra-abdominal contents. The peritoneum was entered and the liver gallbladder stomach small and large bowel were normal. The Omni-Tract retractor was used for exposure. The transverse colon and omentum reflected superiorly and the small bowel was reflected to the right. The retroperitoneum was opened and the duodenum was mobilized to the right. Dissection was taken up in the left renal vein was circumferentially mobilized. The right and left renal arteries were exposed and right and left renal arteries were encircled with Vesseloops. The aorta was exposed and control was obtained in the suprarenal aorta. The aneurysm did extend up to the level of the renal artery takeoffs. Dissection was continued down through the bifurcation. The patient did have moderate  atherosclerotic change of the iliac arteries but no evidence of aneurysm. The aorta was normal caliber just above the bifurcation. The patient was given 25 g of mannitol and 9000 units intravenous heparin. After adequate circulation time the aorta was occluded with a DeBakey clamp above the level the renal arteries and the renal arteries were occluded with Serafin clamps. The common iliac arteries were occluded with Kelly clamps bilaterally. The aneurysm sac was opened with electrocautery in the midline and mural thrombus was removed. There was minimal backbleeding. There were no backbleeding from the IMA or any large lumbars. There were small lumbar back bleed or on the proximal right side and this was controlled with a 2-0 silk pop-off suture. The aorta was transected below the level the renal artery takeoffs. A 24 cm graft was brought to the field and using a felt strip for reinforcement the proximal anastomosis was accomplished with a running 3-0 Prolene suture. The anastomosis was tested and one additional pledgeted suture was required in the posterior wall. The anastomosis was again tested was adequate. After the usual flushing maneuvers to the renal arteries the clamp was removed onto the graft of the renal arteries were reopened. Renal ischemia time was approximately 40 minutes. Next the graft was cut to appropriate length and was sewn end to end to the distal aorta just above the bifurcation. Again a felt strip was used for reinforcement. This was also with a running 3-0 Prolene suture. Prior to completion of the anastomosis the usual flushing maneuvers were undertaken. Anastomosis completed and one additional suture was required posteriorly. Iliac arteries and flow was restored to the groin and lower extremities. The patient was given 50 mg of protamine  to reverse the heparin. The aneurysm sac was closed over the graft with a running 2-0 Vicryl suture. The retroperitoneum was then closed with a running 2-0  Vicryl suture. The small bowel was run in its entirety and found to be without injury. This was placed back in the pelvis. The transverse colon and omentum were placed over this. The midline fascia was closed with #1 PDS suture beginning proximally and distally and tying in the middle. Was irrigated with saline. Hemostasis tablet cautery. The wound was closed with 0 Vicryl subcuticular tissue. Benzoin and Steri-Strips were applied. Patient had excellent femoral and palpable pedal pulses. Sterile dressing was applied and he was transferred to the recovery room extubated in stable condition   Curt Jews, M.D. 11/08/2014 3:44 PM

## 2014-11-08 NOTE — H&P (View-Only) (Signed)
Here today for continued discussion of his 6.3 cm infrarenal aneurysm. He has had cardiac evaluation and clearance with a low risk Myoview study. He is here today with his wife. I discussed again the significance of his aneurysm and that he is not a candidate for standard stent graft repair. We had a long discussion regarding the option of fenestrated graft with the recent approval of this device. I did offer for referral to Dr. Trula Slade for continued discussion of this. I did discussion the recovery from open aneurysm repair and fillet he would be an acceptable candidate for this. He and his wife wish to proceed with open repair.  Past Medical History  Diagnosis Date  . COPD (chronic obstructive pulmonary disease)   . Hypertension   . Gastroesophageal reflux disease   . Hyperlipidemia   . AAA (abdominal aortic aneurysm)   . Thyroid disease   . Lung nodules   . Diabetes mellitus   . ED (erectile dysfunction)   . BPH (benign prostatic hyperplasia)   . CKD (chronic kidney disease), stage III   . Positional vertigo   . Osteopenia   . AAA (abdominal aortic aneurysm)   . Vitamin D deficiency   . Atherosclerosis     pt denies this  . Peripheral vascular disease   . Colon cancer   . Thyroid cancer   . Stroke   . Stroke   . Pneumonia June 2013  . AAA (abdominal aortic aneurysm)     History  Substance Use Topics  . Smoking status: Former Smoker -- 1.50 packs/day for 50 years    Types: Cigarettes    Quit date: 05/28/2005  . Smokeless tobacco: Never Used  . Alcohol Use: No    Family History  Problem Relation Age of Onset  . Stomach cancer Mother   . Heart failure Mother   . Breast cancer Mother   . Pneumonia Father   . Diabetes Paternal Uncle   . Diabetes Brother   . Heart disease Brother     Heart Disease before age 57,  AAA and  Carotid  . Heart attack Brother     No Known Allergies  Current outpatient prescriptions:acetaminophen (TYLENOL) 500 MG tablet, Take 500 mg by  mouth every 6 (six) hours as needed. For pain, Disp: , Rfl: ;  clopidogrel (PLAVIX) 75 MG tablet, Take 75 mg by mouth daily., Disp: , Rfl: ;  fluocinonide (LIDEX) 0.05 % external solution, Apply 1 application topically 2 (two) times daily as needed. For irritation, Disp: , Rfl:  Fluticasone-Salmeterol (ADVAIR) 250-50 MCG/DOSE AEPB, Inhale 1 puff into the lungs every 12 (twelve) hours., Disp: , Rfl: ;  ipratropium (ATROVENT) 0.02 % nebulizer solution, Take 500 mcg by nebulization 2 (two) times daily., Disp: , Rfl: ;  Ipratropium-Albuterol (COMBIVENT IN), Inhale 2 puffs into the lungs 3 (three) times daily. , Disp: , Rfl: ;  levothyroxine (SYNTHROID, LEVOTHROID) 125 MCG tablet, Take 125 mcg by mouth daily., Disp: , Rfl:  losartan-hydrochlorothiazide (HYZAAR) 100-25 MG per tablet, Take 0.5 tablets by mouth daily. , Disp: , Rfl: ;  mirtazapine (REMERON) 15 MG tablet, Take 15 mg by mouth at bedtime., Disp: , Rfl: ;  niacin (NIASPAN) 500 MG CR tablet, Take 500 mg by mouth at bedtime., Disp: , Rfl: ;  omeprazole (PRILOSEC) 20 MG capsule, Take 20 mg by mouth daily., Disp: , Rfl: ;  rosuvastatin (CRESTOR) 20 MG tablet, Take 20 mg by mouth daily., Disp: , Rfl:  sitaGLIPtin (JANUVIA) 100 MG tablet, Take  50 mg by mouth daily. , Disp: , Rfl: ;  triamcinolone cream (KENALOG) 0.1 %, Apply 1 application topically 2 (two) times daily., Disp: , Rfl: ;  Vitamin D, Ergocalciferol, (DRISDOL) 50000 UNITS CAPS, Take 50,000 Units by mouth every 7 (seven) days., Disp: , Rfl:   BP 126/75  Pulse 66  Resp 16  Ht 6' 0.5" (1.842 m)  Wt 198 lb 8 oz (90.039 kg)  BMI 26.54 kg/m2  Body mass index is 26.54 kg/(m^2).       Physical exam is unchanged. Moderate obesity with no tenderness.  We will proceed with open aneurysm repair on 11/16. Understands expected 1-2 day hospitalization in intensive care and total hospitalization expected 5-7 days. Explained less than 5% expected mortality from procedure in 10-15% morbidity. They are  willing to proceed as scheduled. He will stop his Plavix 1 week prior to surgery and will take a bottle of magnesium citrate the night before surgery

## 2014-11-08 NOTE — Anesthesia Procedure Notes (Signed)
Procedure Name: Intubation Date/Time: 11/08/2014 7:46 AM Performed by: Shirlyn Goltz Pre-anesthesia Checklist: Patient identified, Emergency Drugs available, Suction available and Patient being monitored Patient Re-evaluated:Patient Re-evaluated prior to inductionOxygen Delivery Method: Circle system utilized Preoxygenation: Pre-oxygenation with 100% oxygen Intubation Type: IV induction Ventilation: Mask ventilation without difficulty Laryngoscope Size: Miller and 2 Grade View: Grade I Tube type: Subglottic suction tube Tube size: 7.5 mm Number of attempts: 1 Airway Equipment and Method: Stylet Placement Confirmation: ETT inserted through vocal cords under direct vision Secured at: 22 cm Tube secured with: Tape Dental Injury: Teeth and Oropharynx as per pre-operative assessment

## 2014-11-08 NOTE — Anesthesia Preprocedure Evaluation (Signed)
Anesthesia Evaluation  Patient identified by MRN, date of birth, ID band Patient awake    Reviewed: Allergy & Precautions, H&P , NPO status , Patient's Chart, lab work & pertinent test results  Airway Mallampati: II   Neck ROM: full    Dental   Pulmonary shortness of breath, COPDformer smoker,          Cardiovascular hypertension, + Peripheral Vascular Disease     Neuro/Psych CVA    GI/Hepatic GERD-  ,  Endo/Other  diabetes, Type 2  Renal/GU Renal InsufficiencyRenal disease     Musculoskeletal   Abdominal   Peds  Hematology   Anesthesia Other Findings   Reproductive/Obstetrics                             Anesthesia Physical Anesthesia Plan  ASA: III  Anesthesia Plan: General   Post-op Pain Management:    Induction: Intravenous  Airway Management Planned: Oral ETT  Additional Equipment: Arterial line, CVP and Ultrasound Guidance Line Placement  Intra-op Plan:   Post-operative Plan: Extubation in OR  Informed Consent: I have reviewed the patients History and Physical, chart, labs and discussed the procedure including the risks, benefits and alternatives for the proposed anesthesia with the patient or authorized representative who has indicated his/her understanding and acceptance.     Plan Discussed with: CRNA, Anesthesiologist and Surgeon  Anesthesia Plan Comments:         Anesthesia Quick Evaluation

## 2014-11-08 NOTE — Anesthesia Postprocedure Evaluation (Signed)
Anesthesia Post Note  Patient: David [name]  Procedure(s) Performed: Procedure(s) (LRB): ANEURYSM ABDOMINAL AORTIC REPAIR (N/A)  Anesthesia type: General  Patient location: PACU  Post pain: Pain level controlled and Adequate analgesia  Post assessment: Post-op Vital signs reviewed, Patient's Cardiovascular Status Stable, Respiratory Function Stable, Patent Airway and Pain level controlled  Last Vitals:  Filed Vitals:   11/08/14 1500  BP: 162/86  Pulse: 72  Temp:   Resp: 14    Post vital signs: Reviewed and stable  Level of consciousness: awake, alert  and oriented  Complications: No apparent anesthesia complications

## 2014-11-08 NOTE — Progress Notes (Signed)
Subjective: Interval History: none.. Some nausea present. Alert. Reports pain medicine is controlling discomfort  Objective: Vital signs in last 24 hours: Temp:  [96.7 F (35.9 C)-97.6 F (36.4 C)] 97.4 F (36.3 C) (11/16 1601) Pulse Rate:  [59-75] 72 (11/16 1500) Resp:  [14-24] 14 (11/16 1500) BP: (136-186)/(69-95) 162/86 mmHg (11/16 1500) SpO2:  [91 %-98 %] 92 % (11/16 1500) Arterial Line BP: (151-186)/(66-87) 173/78 mmHg (11/16 1500) Weight:  [199 lb (90.266 kg)] 199 lb (90.266 kg) (11/16 0549)  Intake/Output from previous day:   Intake/Output this shift: Total I/O In: 3386.7 [I.V.:2136.7; Blood:750; IV Piggyback:500] Out: 1615 [Urine:615; Blood:1000]  abdomen soft. Palpable left posterior tibial and right dorsalis pedis pulse  Lab Results:  Recent Labs  11/08/14 1036 11/08/14 1150  WBC  --  18.1*  HGB 11.9* 14.2  HCT 35.0* 41.4  PLT  --  199   BMET  Recent Labs  11/08/14 1036 11/08/14 1041 11/08/14 1150  NA 135*  --  138  K 4.7  --  5.0  CL  --   --  102  CO2  --   --  22  GLUCOSE  --  214* 234*  BUN  --   --  25*  CREATININE  --   --  1.63*  CALCIUM  --   --  8.4    Studies/Results: Dg Chest 2 View  11/01/2014   CLINICAL DATA:  Preoperative exam prior to abdominal aortic aneurysm repair; of COPD. , diabetes, and hypertension ; history previous granulomatous infection.  EXAM: CHEST  2 VIEW  COMPARISON:  PA and lateral chest x-ray of June 15, 2012 and chest CT scan of September 21, 2014  FINDINGS: The lungs are mildly hyperinflated with hemidiaphragm flattening. There is no focal infiltrate. There is a stable 4 mm calcified nodule in the right upper lobe. The heart is normal in size. The pulmonary vascularity is not engorged. There is tortuosity of the descending thoracic aorta. There is no pleural effusion or pneumothorax. There is old deformity of the lateral aspect of the left seventh rib.  IMPRESSION: COPD. There is no evidence of pneumonia nor CHF nor  other acute cardiopulmonary disease. There is evidence of previous granulomatous infection which is stable.   Electronically Signed   By: David  Martinique   On: 11/01/2014 11:00   Dg Chest Port 1 View  11/08/2014   CLINICAL DATA:  Initial evaluation status post abdominal aortic aneurysm repair  EXAM: PORTABLE CHEST - 1 VIEW  COMPARISON:  11/01/2014  FINDINGS: There is significant increase in the width of the mediastinum and increased prominence of the aortic arch and descending aorta. Cardiac size is mildly increased. There is moderate vascular congestion with no definite pulmonary edema. There is no consolidation or effusion. An NG tube crosses into the stomach. A right IJ vascular sheath is present.  IMPRESSION: Postoperative vascular congestion with no definite pulmonary edema.  Mediastinal widening with prominence of the aorta postoperatively.   Electronically Signed   By: Skipper Cliche M.D.   On: 11/08/2014 13:38   Dg Abd Portable 1v  11/08/2014   CLINICAL DATA:  Initial evaluation status post abdominal aortic aneurysm repair  EXAM: PORTABLE ABDOMEN - 1 VIEW  COMPARISON:  None.  FINDINGS: NG tube projects over the duodenum bulb. Surgical clips project to the left of the lumbar spine. No abnormally dilated loops of bowel. Aneurysm of the aorta is detected to the left of the lumbar spine.  IMPRESSION: Nonobstructive bowel gas pattern  postoperatively.   Electronically Signed   By: Skipper Cliche M.D.   On: 11/08/2014 13:39   Anti-infectives: Anti-infectives    Start     Dose/Rate Route Frequency Ordered Stop   11/08/14 2000  cefUROXime (ZINACEF) 1.5 g in dextrose 5 % 50 mL IVPB     1.5 g100 mL/hr over 30 Minutes Intravenous Every 12 hours 11/08/14 1321 11/09/14 1959   11/07/14 1512  cefUROXime (ZINACEF) 1.5 g in dextrose 5 % 50 mL IVPB     1.5 g100 mL/hr over 30 Minutes Intravenous 30 min pre-op 11/07/14 1512 11/08/14 0756      Assessment/Plan: s/p Procedure(s): ANEURYSM ABDOMINAL AORTIC  REPAIR (N/A) stable postop from aneurysm resection this morning excellent urine output.   LOS: 0 days   David [name] 11/08/2014, 4:44 PM

## 2014-11-08 NOTE — Transfer of Care (Signed)
Immediate Anesthesia Transfer of Care Note  Patient: David [name]  Procedure(s) Performed: Procedure(s): ANEURYSM ABDOMINAL AORTIC REPAIR (N/A)  Patient Location: PACU  Anesthesia Type:General  Level of Consciousness: lethargic  Airway & Oxygen Therapy: Patient Spontanous Breathing and Patient connected to face mask oxygen  Post-op Assessment: Report given to PACU RN, Post -op Vital signs reviewed and stable and Patient moving all extremities X 4  Post vital signs: Reviewed and stable  Complications: No apparent anesthesia complications

## 2014-11-09 ENCOUNTER — Encounter (HOSPITAL_COMMUNITY): Payer: Self-pay | Admitting: Vascular Surgery

## 2014-11-09 ENCOUNTER — Inpatient Hospital Stay (HOSPITAL_COMMUNITY): Payer: Medicare Other

## 2014-11-09 LAB — CBC
HEMATOCRIT: 39.4 % (ref 39.0–52.0)
Hemoglobin: 13.4 g/dL (ref 13.0–17.0)
MCH: 31.1 pg (ref 26.0–34.0)
MCHC: 34 g/dL (ref 30.0–36.0)
MCV: 91.4 fL (ref 78.0–100.0)
Platelets: 162 10*3/uL (ref 150–400)
RBC: 4.31 MIL/uL (ref 4.22–5.81)
RDW: 14 % (ref 11.5–15.5)
WBC: 16 10*3/uL — ABNORMAL HIGH (ref 4.0–10.5)

## 2014-11-09 LAB — COMPREHENSIVE METABOLIC PANEL
ALT: 23 U/L (ref 0–53)
AST: 20 U/L (ref 0–37)
Albumin: 3.6 g/dL (ref 3.5–5.2)
Alkaline Phosphatase: 54 U/L (ref 39–117)
Anion gap: 15 (ref 5–15)
BILIRUBIN TOTAL: 0.7 mg/dL (ref 0.3–1.2)
BUN: 23 mg/dL (ref 6–23)
CO2: 22 mEq/L (ref 19–32)
Calcium: 8.4 mg/dL (ref 8.4–10.5)
Chloride: 103 mEq/L (ref 96–112)
Creatinine, Ser: 1.57 mg/dL — ABNORMAL HIGH (ref 0.50–1.35)
GFR calc non Af Amer: 40 mL/min — ABNORMAL LOW (ref >90)
GFR, EST AFRICAN AMERICAN: 46 mL/min — AB (ref >90)
Glucose, Bld: 144 mg/dL — ABNORMAL HIGH (ref 70–99)
POTASSIUM: 4 meq/L (ref 3.7–5.3)
Sodium: 140 mEq/L (ref 137–147)
TOTAL PROTEIN: 6 g/dL (ref 6.0–8.3)

## 2014-11-09 LAB — TYPE AND SCREEN
ABO/RH(D): O POS
Antibody Screen: NEGATIVE
UNIT DIVISION: 0
Unit division: 0

## 2014-11-09 LAB — GLUCOSE, CAPILLARY
GLUCOSE-CAPILLARY: 119 mg/dL — AB (ref 70–99)
GLUCOSE-CAPILLARY: 170 mg/dL — AB (ref 70–99)
GLUCOSE-CAPILLARY: 199 mg/dL — AB (ref 70–99)
Glucose-Capillary: 250 mg/dL — ABNORMAL HIGH (ref 70–99)
Glucose-Capillary: 138 mg/dL — ABNORMAL HIGH (ref 70–99)
Glucose-Capillary: 171 mg/dL — ABNORMAL HIGH (ref 70–99)

## 2014-11-09 LAB — BLOOD GAS, ARTERIAL
ACID-BASE DEFICIT: 1.7 mmol/L (ref 0.0–2.0)
Bicarbonate: 22 mEq/L (ref 20.0–24.0)
Drawn by: 252031
O2 Content: 2 L/min
O2 SAT: 93.4 %
PH ART: 7.43 (ref 7.350–7.450)
Patient temperature: 98.6
TCO2: 23.1 mmol/L (ref 0–100)
pCO2 arterial: 33.8 mmHg — ABNORMAL LOW (ref 35.0–45.0)
pO2, Arterial: 63.8 mmHg — ABNORMAL LOW (ref 80.0–100.0)

## 2014-11-09 LAB — MAGNESIUM: Magnesium: 1.8 mg/dL (ref 1.5–2.5)

## 2014-11-09 LAB — AMYLASE: Amylase: 37 U/L (ref 0–105)

## 2014-11-09 MED ORDER — SODIUM CHLORIDE 0.9 % IV SOLN
INTRAVENOUS | Status: DC | PRN
Start: 2014-11-09 — End: 2014-11-10

## 2014-11-09 NOTE — Progress Notes (Signed)
OT Cancellation Note  Patient Details Name: David [name] MRN: [id number] DOB: December 22, 1931   Cancelled Treatment:    Reason Eval/Treat Not Completed: Fatigue/lethargy limiting ability to participate - will try back tomorrow.   [name], OTR/L [phone]  11/09/2014, 1:28 PM

## 2014-11-09 NOTE — Progress Notes (Signed)
Subjective: Interval History: none.. hhas abdominal soreness but looks quite good today. Some nausea  Objective: Vital signs in last 24 hours: Temp:  [96.7 F (35.9 C)-98.5 F (36.9 C)] 98 F (36.7 C) (11/17 0800) Pulse Rate:  [59-80] 78 (11/17 0800) Resp:  [10-24] 17 (11/17 0800) BP: (129-186)/(51-95) 129/59 mmHg (11/17 0800) SpO2:  [90 %-98 %] 91 % (11/17 0800) Arterial Line BP: (67-201)/(45-95) 149/46 mmHg (11/17 0800)  Intake/Output from previous day: 11/16 0701 - 11/17 0700 In: 4936.7 [I.V.:3636.7; Blood:750; IV Piggyback:550] Out: 7672 [Urine:1750; Blood:1000] Intake/Output this shift: Total I/O In: 100 [I.V.:100] Out: -   abdomen soft. Appropriately tender.  Palpable femoral and 2-3+ popliteal pulses bilaterally  Lab Results:  Recent Labs  11/08/14 1150 11/09/14 0426  WBC 18.1* 16.0*  HGB 14.2 13.4  HCT 41.4 39.4  PLT 199 162   BMET  Recent Labs  11/08/14 1150 11/09/14 0426  NA 138 140  K 5.0 4.0  CL 102 103  CO2 22 22  GLUCOSE 234* 144*  BUN 25* 23  CREATININE 1.63* 1.57*  CALCIUM 8.4 8.4    Studies/Results: Dg Chest 2 View  11/01/2014   CLINICAL DATA:  Preoperative exam prior to abdominal aortic aneurysm repair; of COPD. , diabetes, and hypertension ; history previous granulomatous infection.  EXAM: CHEST  2 VIEW  COMPARISON:  PA and lateral chest x-ray of June 15, 2012 and chest CT scan of September 21, 2014  FINDINGS: The lungs are mildly hyperinflated with hemidiaphragm flattening. There is no focal infiltrate. There is a stable 4 mm calcified nodule in the right upper lobe. The heart is normal in size. The pulmonary vascularity is not engorged. There is tortuosity of the descending thoracic aorta. There is no pleural effusion or pneumothorax. There is old deformity of the lateral aspect of the left seventh rib.  IMPRESSION: COPD. There is no evidence of pneumonia nor CHF nor other acute cardiopulmonary disease. There is evidence of previous  granulomatous infection which is stable.   Electronically Signed   By: David  Martinique   On: 11/01/2014 11:00   Dg Chest Port 1 View  11/09/2014   CLINICAL DATA:  One day postop abdominal aortic aneurysm repair. Follow-up pulmonary vascular congestion.  EXAM: PORTABLE CHEST - 1 VIEW  COMPARISON:  Portable chest x-ray yesterday. Two-view chest x-ray 11/01/2014, 06/15/2012.  FINDINGS: Interval resolution of pulmonary venous hypertension identified yesterday. Suboptimal inspiration accounts for atelectasis in the lung bases. Stable calcified granuloma in the right upper lobe. Lungs otherwise clear. Right jugular introducer sheath tip projects over the upper SVC. Nasogastric tube courses below the diaphragm into the stomach.  IMPRESSION: 1.  Support apparatus satisfactory. 2. Resolution of pulmonary venous hypertension. Suboptimal inspiration accounts for bibasilar atelectasis. 3.  No acute cardiopulmonary disease otherwise.   Electronically Signed   By: Evangeline Dakin M.D.   On: 11/09/2014 08:11   Dg Chest Port 1 View  11/08/2014   CLINICAL DATA:  Initial evaluation status post abdominal aortic aneurysm repair  EXAM: PORTABLE CHEST - 1 VIEW  COMPARISON:  11/01/2014  FINDINGS: There is significant increase in the width of the mediastinum and increased prominence of the aortic arch and descending aorta. Cardiac size is mildly increased. There is moderate vascular congestion with no definite pulmonary edema. There is no consolidation or effusion. An NG tube crosses into the stomach. A right IJ vascular sheath is present.  IMPRESSION: Postoperative vascular congestion with no definite pulmonary edema.  Mediastinal widening with prominence of the aorta  postoperatively.   Electronically Signed   By: Skipper Cliche M.D.   On: 11/08/2014 13:38   Dg Abd Portable 1v  11/08/2014   CLINICAL DATA:  Initial evaluation status post abdominal aortic aneurysm repair  EXAM: PORTABLE ABDOMEN - 1 VIEW  COMPARISON:  None.   FINDINGS: NG tube projects over the duodenum bulb. Surgical clips project to the left of the lumbar spine. No abnormally dilated loops of bowel. Aneurysm of the aorta is detected to the left of the lumbar spine.  IMPRESSION: Nonobstructive bowel gas pattern postoperatively.   Electronically Signed   By: Skipper Cliche M.D.   On: 11/08/2014 13:39   Anti-infectives: Anti-infectives    Start     Dose/Rate Route Frequency Ordered Stop   11/08/14 2000  cefUROXime (ZINACEF) 1.5 g in dextrose 5 % 50 mL IVPB     1.5 g100 mL/hr over 30 Minutes Intravenous Every 12 hours 11/08/14 1321 11/09/14 1959   11/07/14 1512  cefUROXime (ZINACEF) 1.5 g in dextrose 5 % 50 mL IVPB     1.5 g100 mL/hr over 30 Minutes Intravenous 30 min pre-op 11/07/14 1512 11/08/14 0756      Assessment/Plan: s/p Procedure(s): ANEURYSM ABDOMINAL AORTIC REPAIR (N/A) stable postop day 1. Will DC MG and a line. Mobilize.  Excellent urine output and creatinine stable   LOS: 1 day   Brigett Estell 11/09/2014, 8:42 AM

## 2014-11-09 NOTE — Evaluation (Signed)
Physical Therapy Evaluation Patient Details Name: David [name] MRN: [id number] DOB: 1931-09-20 Today's Date: 11/09/2014   History of Present Illness  s/p abdominal aortic aneurysm repair 11/08/14  Clinical Impression  Patient still reporting some nausea during transfers and abdominal pain throughout session.  Educated on proper body mechanics and sequencing during transfers and gait.  SpO2 remained above 90% during the entire session with 3L supplemental O2.  He is motivated to get moving and will likely progress quickly.  Patient would continue to benefit from skilled therapy to address functional limitations detailed below so that he may reach maximum independence and return to his PLOF.    Follow Up Recommendations Home health PT    Equipment Recommendations  Other (comment)    Recommendations for Other Services OT consult     Precautions / Restrictions Precautions Precautions: Other (comment) Precaution Comments: NG tube Restrictions Weight Bearing Restrictions: No      Mobility  Bed Mobility Overal bed mobility: Needs Assistance Bed Mobility: Rolling;Sidelying to Sit Rolling: Min assist Sidelying to sit: Min assist       General bed mobility comments: Assistance required to move legs over side of bed and roll to side; also for trunk control and support when transferring sidelying to sit  Transfers Overall transfer level: Needs assistance Equipment used: Rolling walker (2 wheeled) Transfers: Sit to/from Stand Sit to Stand: Min assist         General transfer comment: Verbal cues for hand placement and body position during transfer; min assist to overcome minor weakness when standing and for safety  Ambulation/Gait Ambulation/Gait assistance: Min assist Ambulation Distance (Feet): 160 Feet Assistive device: Rolling walker (2 wheeled) Gait Pattern/deviations: Step-through pattern;Decreased stride length Gait velocity: slow   General Gait Details:  Verbal cues for posture and breathing while ambulating.  Chair to follow but patient did not require a rest break.  Stairs            Wheelchair Mobility    Modified Rankin (Stroke Patients Only)       Balance Overall balance assessment: Needs assistance Sitting-balance support: Feet supported;Bilateral upper extremity supported Sitting balance-Leahy Scale: Poor     Standing balance support: Bilateral upper extremity supported Standing balance-Leahy Scale: Poor                               Pertinent Vitals/Pain Pain Assessment: 0-10 Pain Location: stomach Pain Descriptors / Indicators: Sore Pain Intervention(s): Limited activity within patient's tolerance;Monitored during session;Repositioned    Home Living Family/patient expects to be discharged to:: Private residence Living Arrangements: Spouse/significant other Available Help at Discharge: Family Type of Home: House Home Access: Stairs to enter   Technical brewer of Steps: 3 Home Layout: One level Home Equipment: Environmental consultant - 2 wheels;Cane - single point;Shower seat Additional Comments: home toilet is a good height for pt    Prior Function Level of Independence: Independent               Hand Dominance        Extremity/Trunk Assessment   Upper Extremity Assessment: Overall WFL for tasks assessed           Lower Extremity Assessment: Generalized weakness      Cervical / Trunk Assessment: Normal  Communication   Communication: No difficulties  Cognition Arousal/Alertness: Awake/alert Behavior During Therapy: WFL for tasks assessed/performed Overall Cognitive Status: Within Functional Limits for tasks assessed  General Comments      Exercises        Assessment/Plan    PT Assessment Patient needs continued PT services  PT Diagnosis Generalized weakness;Acute pain;Difficulty walking   PT Problem List Decreased strength;Decreased  activity tolerance;Decreased mobility;Decreased balance  PT Treatment Interventions DME instruction;Gait training;Stair training;Functional mobility training;Therapeutic activities;Therapeutic exercise;Balance training   PT Goals (Current goals can be found in the Care Plan section) Acute Rehab PT Goals Patient Stated Goal: to get home and back to mowing his lawn PT Goal Formulation: With patient Time For Goal Achievement: 11/23/14 Potential to Achieve Goals: Good    Frequency Min 3X/week   Barriers to discharge        Co-evaluation               End of Session Equipment Utilized During Treatment: Gait belt;Oxygen Activity Tolerance: Patient tolerated treatment well Patient left: in chair;with call bell/phone within reach Nurse Communication: Mobility status         Time: 1747-1595 PT Time Calculation (min) (ACUTE ONLY): 22 min   Charges:   PT Evaluation $Initial PT Evaluation Tier I: 1 Procedure PT Treatments $Gait Training: 8-22 mins   PT G Codes:          Rakan Soffer SPT 11/09/2014, 10:01 AM

## 2014-11-09 NOTE — Progress Notes (Signed)
  [facility] Progress Note  11/09/2014 10:34 AM 1 Day Post-Op  Subjective:  Sitting up in chair. Having some nausea and abdominal soreness. No flatus.  Tmax 98.5 BP sys 120s-180s 02 98% RA  Filed Vitals:   11/09/14 0800  BP: 129/59  Pulse: 78  Temp: 98 F (36.7 C)  Resp: 17    Physical Exam: Cardiac:  regular Lungs:  Non labored breathing Abdomen:  Soft, mild tenderness Extremities:  Palpable femoral pulses bilaterally and 2-3+ popliteal pulses bilaterally  CBC    Component Value Date/Time   WBC 16.0* 11/09/2014 0426   WBC 4.2 01/17/2011 0844   RBC 4.31 11/09/2014 0426   RBC 4.26 01/17/2011 0844   HGB 13.4 11/09/2014 0426   HGB 14.1 01/17/2011 0844   HCT 39.4 11/09/2014 0426   HCT 40.6 01/17/2011 0844   PLT 162 11/09/2014 0426   PLT 152 01/17/2011 0844   MCV 91.4 11/09/2014 0426   MCV 95.3 01/17/2011 0844   MCH 31.1 11/09/2014 0426   MCH 33.8* 07/04/2010 1352   MCHC 34.0 11/09/2014 0426   MCHC 34.7 01/17/2011 0844   RDW 14.0 11/09/2014 0426   RDW 13.6 01/17/2011 0844   LYMPHSABS 0.9 01/17/2011 0844   MONOABS 0.7 01/17/2011 0844   EOSABS 0.1 01/17/2011 0844   BASOSABS 0.0 01/17/2011 0844    BMET    Component Value Date/Time   NA 140 11/09/2014 0426   K 4.0 11/09/2014 0426   CL 103 11/09/2014 0426   CO2 22 11/09/2014 0426   GLUCOSE 144* 11/09/2014 0426   BUN 23 11/09/2014 0426   CREATININE 1.57* 11/09/2014 0426   CREATININE 0.84 09/15/2014 1400   CALCIUM 8.4 11/09/2014 0426   GFRNONAA 40* 11/09/2014 0426   GFRAA 46* 11/09/2014 0426    INR    Component Value Date/Time   INR 1.32 11/08/2014 1150     Intake/Output Summary (Last 24 hours) at 11/09/14 1034 Last data filed at 11/09/14 0800  Gross per 24 hour  Intake 3536.67 ml  Output   1680 ml  Net 1856.67 ml     Assessment/Plan:  80 y.o. male is s/p open resection and grafting of juxtarenal abdominal aortic aneurysm with straight graft repair with a 24 mm  Hemashield graft  1 Day Post-Op  -Stable post-operatively -NG tube with low output, will d/c today. Advance diet slowly -Mobilize.  -Good UOP. Baseline creatine elevated. (1.7 in 2013) 1.57 today.  -Hgb stable.  -BP stable. D/c a-line -Dispo: will stay in unit today. Anticipate transfer to floor tomorrow    [name], PA-C [facility] Office: [phone] Pager: [phone] 11/09/2014 10:34 AM

## 2014-11-09 NOTE — Progress Notes (Signed)
Utilization Review Completed.Donne Anon T11/17/2015

## 2014-11-09 NOTE — Progress Notes (Signed)
Called Dr. Donnetta Hutching in reference to high CBG and high BP.  Received order to change fluids to NS with 20 KCL from D5 1/2NS with 20 Kcl.  Dr. Donnetta Hutching stated he was fine with SBP <160 rather than <140as stated in labetalol parameters.  Will continue to monitor pt.

## 2014-11-10 LAB — CBC
HCT: 37.4 % — ABNORMAL LOW (ref 39.0–52.0)
Hemoglobin: 12.7 g/dL — ABNORMAL LOW (ref 13.0–17.0)
MCH: 32.4 pg (ref 26.0–34.0)
MCHC: 34 g/dL (ref 30.0–36.0)
MCV: 95.4 fL (ref 78.0–100.0)
PLATELETS: 120 10*3/uL — AB (ref 150–400)
RBC: 3.92 MIL/uL — ABNORMAL LOW (ref 4.22–5.81)
RDW: 14.2 % (ref 11.5–15.5)
WBC: 12.9 10*3/uL — AB (ref 4.0–10.5)

## 2014-11-10 LAB — BASIC METABOLIC PANEL
ANION GAP: 13 (ref 5–15)
BUN: 21 mg/dL (ref 6–23)
CALCIUM: 8.3 mg/dL — AB (ref 8.4–10.5)
CHLORIDE: 107 meq/L (ref 96–112)
CO2: 22 mEq/L (ref 19–32)
Creatinine, Ser: 1.58 mg/dL — ABNORMAL HIGH (ref 0.50–1.35)
GFR calc non Af Amer: 40 mL/min — ABNORMAL LOW (ref >90)
GFR, EST AFRICAN AMERICAN: 46 mL/min — AB (ref >90)
Glucose, Bld: 158 mg/dL — ABNORMAL HIGH (ref 70–99)
Potassium: 4.3 mEq/L (ref 3.7–5.3)
SODIUM: 142 meq/L (ref 137–147)

## 2014-11-10 LAB — GLUCOSE, CAPILLARY
GLUCOSE-CAPILLARY: 140 mg/dL — AB (ref 70–99)
Glucose-Capillary: 124 mg/dL — ABNORMAL HIGH (ref 70–99)
Glucose-Capillary: 131 mg/dL — ABNORMAL HIGH (ref 70–99)
Glucose-Capillary: 145 mg/dL — ABNORMAL HIGH (ref 70–99)
Glucose-Capillary: 163 mg/dL — ABNORMAL HIGH (ref 70–99)
Glucose-Capillary: 167 mg/dL — ABNORMAL HIGH (ref 70–99)
Glucose-Capillary: 197 mg/dL — ABNORMAL HIGH (ref 70–99)

## 2014-11-10 MED ORDER — NIACIN ER (ANTIHYPERLIPIDEMIC) 500 MG PO TBCR
500.0000 mg | EXTENDED_RELEASE_TABLET | Freq: Every day | ORAL | Status: DC
  Administered 2014-11-10 – 2014-11-11 (×2): 500 mg via ORAL
  Filled 2014-11-10 (×3): qty 1

## 2014-11-10 MED ORDER — PANTOPRAZOLE SODIUM 40 MG PO TBEC
40.0000 mg | DELAYED_RELEASE_TABLET | Freq: Every day | ORAL | Status: DC
  Administered 2014-11-11 – 2014-11-12 (×2): 40 mg via ORAL
  Filled 2014-11-10 (×2): qty 1

## 2014-11-10 MED ORDER — ROSUVASTATIN CALCIUM 20 MG PO TABS
20.0000 mg | ORAL_TABLET | Freq: Every day | ORAL | Status: DC
  Administered 2014-11-10 – 2014-11-12 (×3): 20 mg via ORAL
  Filled 2014-11-10 (×3): qty 1

## 2014-11-10 MED ORDER — CLOPIDOGREL BISULFATE 75 MG PO TABS
75.0000 mg | ORAL_TABLET | Freq: Every day | ORAL | Status: DC
  Administered 2014-11-10 – 2014-11-12 (×3): 75 mg via ORAL
  Filled 2014-11-10 (×3): qty 1

## 2014-11-10 MED ORDER — LOSARTAN POTASSIUM 50 MG PO TABS
50.0000 mg | ORAL_TABLET | Freq: Every day | ORAL | Status: DC
  Administered 2014-11-10 – 2014-11-12 (×3): 50 mg via ORAL
  Filled 2014-11-10 (×3): qty 1

## 2014-11-10 MED ORDER — LINAGLIPTIN 5 MG PO TABS
5.0000 mg | ORAL_TABLET | Freq: Every day | ORAL | Status: DC
  Administered 2014-11-10 – 2014-11-12 (×3): 5 mg via ORAL
  Filled 2014-11-10 (×3): qty 1

## 2014-11-10 MED ORDER — LEVOTHYROXINE SODIUM 125 MCG PO TABS
125.0000 ug | ORAL_TABLET | Freq: Every day | ORAL | Status: DC
  Administered 2014-11-10 – 2014-11-12 (×3): 125 ug via ORAL
  Filled 2014-11-10 (×5): qty 1

## 2014-11-10 MED ORDER — LOSARTAN POTASSIUM-HCTZ 100-25 MG PO TABS: 0.5000 | ORAL_TABLET | Freq: Every day | ORAL | Status: DC

## 2014-11-10 MED ORDER — HYDROCHLOROTHIAZIDE 12.5 MG PO CAPS
12.5000 mg | ORAL_CAPSULE | Freq: Every day | ORAL | Status: DC
  Administered 2014-11-10 – 2014-11-12 (×3): 12.5 mg via ORAL
  Filled 2014-11-10 (×3): qty 1

## 2014-11-10 NOTE — Evaluation (Signed)
Occupational Therapy Evaluation Patient Details Name: David [name] MRN: [id number] DOB: 1931/05/12 Today's Date: 11/10/2014    History of Present Illness s/p abdominal aortic aneurysm repair 11/08/14   Clinical Impression   Pt admitted with above. He demonstrates the below listed deficits and will benefit from continued OT to maximize safety and independence with BADLs.  Pt presents to OT with generalized weakness/deconditioning.  He requires mod A with BADLs, and min A for functional mobility.  DOE 3/4 with sats 91-93% on 2L; HR 109.   He has good family support.  Will follow acutely       Follow Up Recommendations  No OT follow up;Supervision/Assistance - 24 hour    Equipment Recommendations  None recommended by OT    Recommendations for Other Services       Precautions / Restrictions Precautions Precautions: Fall Restrictions Weight Bearing Restrictions: No      Mobility Bed Mobility Overal bed mobility: Needs Assistance Bed Mobility: Rolling;Sidelying to Sit;Sit to Sidelying Rolling: Min assist Sidelying to sit: Min assist     Sit to sidelying: Min assist General bed mobility comments: Pt instructed to roll to decrease stress on abdomen.  Requires assist to lift trunk and to roll fully on side   Transfers Overall transfer level: Needs assistance Equipment used: Rolling walker (2 wheeled) Transfers: Sit to/from [employer] Sit to Stand: Min assist Stand pivot transfers: Min assist       General transfer comment: verbal cues for hand placement     Balance Overall balance assessment: Needs assistance Sitting-balance support: Feet supported Sitting balance-Leahy Scale: Good     Standing balance support: Bilateral upper extremity supported Standing balance-Leahy Scale: Good                              ADL Overall ADL's : Needs assistance/impaired Eating/Feeding: Independent;Sitting;Bed level   Grooming: Wash/dry  hands;Wash/dry face;Oral care;Brushing hair;Set up;Sitting   Upper Body Bathing: Minimal assitance;Sitting   Lower Body Bathing: Moderate assistance;Sit to/from stand   Upper Body Dressing : Minimal assistance;Sitting   Lower Body Dressing: Moderate assistance;Sit to/from stand Lower Body Dressing Details (indicate cue type and reason): dons/doffs socks with min A Toilet Transfer: Minimal assistance;Ambulation;Comfort height toilet   Toileting- Clothing Manipulation and Hygiene: Moderate assistance;Sit to/from stand       Functional mobility during ADLs: Minimal assistance;Rolling walker General ADL Comments: Pt fatigues with ADLs, but is very motivated.       Vision                     Perception     Praxis      Pertinent Vitals/Pain Pain Assessment: No/denies pain     Hand Dominance Right   Extremity/Trunk Assessment Upper Extremity Assessment Upper Extremity Assessment: Generalized weakness   Lower Extremity Assessment Lower Extremity Assessment: Defer to PT evaluation   Cervical / Trunk Assessment Cervical / Trunk Assessment: Normal   Communication Communication Communication: No difficulties   Cognition Arousal/Alertness: Awake/alert Behavior During Therapy: WFL for tasks assessed/performed Overall Cognitive Status: Within Functional Limits for tasks assessed                     General Comments       Exercises       Shoulder Instructions      Home Living Family/patient expects to be discharged to:: Private residence Living Arrangements: Spouse/significant other Available Help at Discharge:  Family Type of Home: House Home Access: Stairs to enter [employer] of Steps: 3   Home Layout: One level     Bathroom Shower/Tub: Occupational psychologist: Handicapped height Bathroom Accessibility: Yes How Accessible: Accessible via walker Home Equipment: [place] - 2 wheels;Cane - single point;Shower seat;Grab bars -  tub/shower;Hand held shower head          Prior Functioning/Environment Level of Independence: Independent        Comments: Pt drives and is active    OT Diagnosis: Generalized weakness   OT Problem List: Decreased strength;Decreased activity tolerance;Impaired balance (sitting and/or standing);Decreased safety awareness;Cardiopulmonary status limiting activity   OT Treatment/Interventions: Self-care/ADL training;Energy conservation;DME and/or AE instruction;Therapeutic activities;Patient/family education;Balance training    OT Goals(Current goals can be found in the care plan section) Acute Rehab OT Goals Patient Stated Goal: to get home  OT Goal Formulation: With patient Time For Goal Achievement: 11/24/14 Potential to Achieve Goals: Good ADL Goals Pt Will Perform Grooming: with supervision;standing Pt Will Perform Upper Body Bathing: with supervision;standing Pt Will Perform Lower Body Bathing: with supervision;sit to/from stand Pt Will Perform Upper Body Dressing: with supervision;sitting;with set-up Pt Will Perform Lower Body Dressing: with supervision;sit to/from stand Pt Will Transfer to Toilet: with supervision;ambulating;regular height toilet;grab bars Pt Will Perform Toileting - Clothing Manipulation and hygiene: with supervision;sit to/from stand Pt Will Perform Tub/Shower Transfer: Shower transfer;with supervision;ambulating;shower seat;grab bars  OT Frequency: Min 2X/week   Barriers to D/C:            Co-evaluation              End of Session Equipment Utilized During Treatment: Rolling walker;Oxygen Nurse Communication: Mobility status  Activity Tolerance: Patient tolerated treatment well Patient left: in bed;with call bell/phone within reach;with family/visitor present   Time: 2197-5883 OT Time Calculation (min): 35 min Charges:  OT General Charges $OT Visit: 1 Procedure OT Evaluation $Initial OT Evaluation Tier I: 1 Procedure OT  Treatments $Self Care/Home Management : 8-22 mins $Therapeutic Activity: 8-22 mins G-Codes:    Sativa Gelles M 2014-12-02, 5:35 PM

## 2014-11-10 NOTE — Progress Notes (Signed)
NURSING PROGRESS NOTE  David [name] [id number] Transfer Data: 11/10/2014 1:59 PM Attending Provider: Rosetta Posner, MD OHY:[id number],GYIR A, MD Code Status: Full   David [name] is a 80 y.o. male patient transferred from [place]  -No acute distress noted.  -No complaints of shortness of breath.  -No complaints of chest pain.   Cardiac Monitoring: Box # [id number] in place. Cardiac monitor yields:normal sinus rhythm.  Blood pressure 138/71, pulse 86, temperature 97.6 F (36.4 C), temperature source Oral, resp. rate 20, height 6' (1.829 m), weight 86.9 kg (191 lb 9.3 oz), SpO2 93 %.   IV Fluids:  IV in place, occlusive dsg intact without redness, IV cath hand left, condition patent and no redness normal saline with 20 mEq of potassium chloride.   Allergies:  Review of patient's allergies indicates no known allergies.  Past Medical History:   has a past medical history of COPD (chronic obstructive pulmonary disease); Hypertension; Gastroesophageal reflux disease; Hyperlipidemia; AAA (abdominal aortic aneurysm); Thyroid disease; Lung nodules; Diabetes mellitus; ED (erectile dysfunction); BPH (benign prostatic hyperplasia); Positional vertigo; Osteopenia; AAA (abdominal aortic aneurysm); Vitamin D deficiency; Atherosclerosis; Peripheral vascular disease; Colon cancer; Thyroid cancer; Stroke; Stroke; Pneumonia (June 2013); AAA (abdominal aortic aneurysm); Type 2 diabetes mellitus; CKD (chronic kidney disease), stage III; and Shortness of breath.  Past Surgical History:   has past surgical history that includes Colon resection (2007); Thyroidectomy (2008); Septoplasty; Colon surgery (2007); and Abdominal aortic aneurysm repair (N/A, 11/08/2014).  Social History:   reports that he quit smoking about 9 years ago. His smoking use included Cigarettes. He has a 75 pack-year smoking history. He has never used smokeless tobacco. He reports that he does not drink alcohol or use illicit drugs.  Skin:  Abdominal incision with steri-strips dry and intact with old drainage.   Patient/Family orientated to room. Information packet given to patient/family. Admission inpatient armband information verified with patient/family to include name and date of birth and placed on patient arm. Side rails up x 2, fall assessment and education completed with patient/family. Patient/family able to verbalize understanding of risk associated with falls and verbalized understanding to call for assistance before getting out of bed. Call light within reach. Patient/family able to voice and demonstrate understanding of unit orientation instructions.    Will continue to evaluate and treat per MD orders.

## 2014-11-10 NOTE — Progress Notes (Signed)
  [facility] Progress Note  11/10/2014 8:44 AM 2 Days Post-Op  Subjective: Having minor nausea but improved from yesterday. Had difficulty with "jitters" last night. Per pm RN was able to sleep after remeron and pain meds. Tolerating ice chips fine. No flatus.   Tmax 100.3, now afebrile BP sys 120s-160s 02 93% 4L  Filed Vitals:   11/10/14 0745  BP:   Pulse:   Temp: 98.2 F (36.8 C)  Resp:     Physical Exam: Cardiac:  Regular rate and rhythm Lungs:  Clear to auscultation bilaterally Abdomen: normoactive bowel sounds. Soft, mild tenderness to RLQ. Incisions:  Midline incision intact. Steri-strips in place.  Extremities:  + DP doppler signals bilaterally.   CBC    Component Value Date/Time   WBC 12.9* 11/10/2014 0405   WBC 4.2 01/17/2011 0844   RBC 3.92* 11/10/2014 0405   RBC 4.26 01/17/2011 0844   HGB 12.7* 11/10/2014 0405   HGB 14.1 01/17/2011 0844   HCT 37.4* 11/10/2014 0405   HCT 40.6 01/17/2011 0844   PLT 120* 11/10/2014 0405   PLT 152 01/17/2011 0844   MCV 95.4 11/10/2014 0405   MCV 95.3 01/17/2011 0844   MCH 32.4 11/10/2014 0405   MCH 33.8* 07/04/2010 1352   MCHC 34.0 11/10/2014 0405   MCHC 34.7 01/17/2011 0844   RDW 14.2 11/10/2014 0405   RDW 13.6 01/17/2011 0844   LYMPHSABS 0.9 01/17/2011 0844   MONOABS 0.7 01/17/2011 0844   EOSABS 0.1 01/17/2011 0844   BASOSABS 0.0 01/17/2011 0844    BMET    Component Value Date/Time   NA 142 11/10/2014 0405   K 4.3 11/10/2014 0405   CL 107 11/10/2014 0405   CO2 22 11/10/2014 0405   GLUCOSE 158* 11/10/2014 0405   BUN 21 11/10/2014 0405   CREATININE 1.58* 11/10/2014 0405   CREATININE 0.84 09/15/2014 1400   CALCIUM 8.3* 11/10/2014 0405   GFRNONAA 40* 11/10/2014 0405   GFRAA 46* 11/10/2014 0405    INR    Component Value Date/Time   INR 1.32 11/08/2014 1150     Intake/Output Summary (Last 24 hours) at 11/10/14 0844 Last data filed at 11/10/14 0700  Gross per 24 hour  Intake    2340 ml  Output   1575 ml  Net    765 ml     Assessment/Plan:  80 y.o. male is s/p  open resection and grafting of juxtarenal abdominal aortic aneurysm with straight graft repair with a 24 mm Hemashield graft  2 Days Post-Op  -Still having some nausea, but improved from yesterday. Will try clear liquids today. -Continue mobilization. -Restart all oral meds.  -Hgb stable. -WBC improved to 12.9. Likely reactive from surgery. Afebrile. -Good UOP. Creatinine stable. Elevated at baseline. -Dispo: Will transfer to 2W. Dr. Donnetta Hutching to see patient.    [name], PA-C [facility] Office: [phone] Pager: [phone] 11/10/2014 8:44 AM

## 2014-11-10 NOTE — Plan of Care (Signed)
Problem: Phase I Progression Outcomes Goal: Pain controlled with appropriate interventions Outcome: Completed/Met Date Met:  11/10/14 Goal: OOB as tolerated unless otherwise ordered Outcome: Completed/Met Date Met:  11/10/14 Goal: Incision/dressings dry and intact Outcome: Progressing Old drainage Goal: Voiding-avoid urinary catheter unless indicated Outcome: Not Progressing Pt foley catheter remains

## 2014-11-11 LAB — GLUCOSE, CAPILLARY
GLUCOSE-CAPILLARY: 129 mg/dL — AB (ref 70–99)
GLUCOSE-CAPILLARY: 148 mg/dL — AB (ref 70–99)
Glucose-Capillary: 133 mg/dL — ABNORMAL HIGH (ref 70–99)
Glucose-Capillary: 122 mg/dL — ABNORMAL HIGH (ref 70–99)
Glucose-Capillary: 217 mg/dL — ABNORMAL HIGH (ref 70–99)
Glucose-Capillary: 127 mg/dL — ABNORMAL HIGH (ref 70–99)

## 2014-11-11 NOTE — Progress Notes (Signed)
Physical Therapy Treatment Patient Details Name: David [name] MRN: [id number] DOB: June 09, 1931 Today's Date: 11/11/2014    History of Present Illness s/p abdominal aortic aneurysm repair 11/08/14    PT Comments    Patient is progressing well towards physical therapy goals, ambulating up to 320 feet with a rolling walker. Safely completed stair training this AM. Feel he is adequate for d/c from a mobility standpoint when medically ready. He is eager to return home and states he has no further concerns regarding mobility. Patient will continue to benefit from skilled physical therapy services at home with HHPT to further improve independence with functional mobility.    Follow Up Recommendations  Home health PT     Equipment Recommendations  Rolling walker with 5" wheels    Recommendations for Other Services Speech consult     Precautions / Restrictions Precautions Precautions: Fall Restrictions Weight Bearing Restrictions: No    Mobility  Bed Mobility Overal bed mobility: Modified Independent                Transfers Overall transfer level: Needs assistance Equipment used: Rolling walker (2 wheeled) Transfers: Sit to/from Stand Sit to Stand: Supervision         General transfer comment: Supervision for safety. Correctly places hands on stable surface. Mild instability until holding rolling walker. did not require physical assist.  Ambulation/Gait Ambulation/Gait assistance: Supervision Ambulation Distance (Feet): 320 Feet Assistive device: Rolling walker (2 wheeled) Gait Pattern/deviations: Step-through pattern;Decreased stride length;Narrow base of support   Gait velocity interpretation: Below normal speed for age/gender General Gait Details: Slow and guarded. Good control and placement of rolling walker. Educated on pursed lip breathing and energy conservation techniques. Required one standing rest break to complete distance. No loss of balance noted  during bout.   Stairs Stairs: Yes Stairs assistance: Min guard Stair Management: One rail Right;Step to pattern;Forwards Number of Stairs: 4 General stair comments: Educated on technique and VC for sequencing. Pt able to complete this task safely.  Wheelchair Mobility    Modified Rankin (Stroke Patients Only)       Balance                                    Cognition Arousal/Alertness: Awake/alert Behavior During Therapy: WFL for tasks assessed/performed Overall Cognitive Status: Within Functional Limits for tasks assessed                      Exercises General Exercises - Lower Extremity Ankle Circles/Pumps: AROM;Both;10 reps;Seated Gluteal Sets: Strengthening;Both;10 reps;Seated Long Arc Quad: Strengthening;Both;10 reps;Seated Hip Flexion/Marching: Strengthening;Both;10 reps;Seated    General Comments General comments (skin integrity, edema, etc.): Noted patient coughing heavily after sipping water at end of therapy session. States this occurs often and that he has had pneumonia on several occasions in the past. 91-98% SpO2 on room air. HR to 100 during ambulation      Pertinent Vitals/Pain Pain Assessment: No/denies pain    Home Living                      Prior Function            PT Goals (current goals can now be found in the care plan section) Acute Rehab PT Goals Patient Stated Goal: to get home  Progress towards PT goals: Progressing toward goals    Frequency  Min 3X/week    PT  Plan Current plan remains appropriate    Co-evaluation             End of Session Equipment Utilized During Treatment: Gait belt Activity Tolerance: Patient tolerated treatment well Patient left: in bed;with call bell/phone within reach;with family/visitor present     Time: 1147-1206 PT Time Calculation (min) (ACUTE ONLY): 19 min  Charges:  $Gait Training: 8-22 mins                    G Codes:      Ellouise Newer 11-22-2014, 12:45 PM  Camille Bal Sunray, [place]

## 2014-11-11 NOTE — Plan of Care (Signed)
Problem: Phase I Progression Outcomes Goal: Incision/dressings dry and intact Outcome: Completed/Met Date Met:  11/11/14

## 2014-11-11 NOTE — Progress Notes (Signed)
Subjective: Interval History: none.. Comfortable. Had a bowel movement. No difficulty with liquids  Objective: Vital signs in last 24 hours: Temp:  [97.6 F (36.4 C)-98.4 F (36.9 C)] 98.4 F (36.9 C) (11/19 0337) Pulse Rate:  [80-91] 88 (11/19 0337) Resp:  [16-24] 18 (11/19 0337) BP: (138-168)/(58-72) 146/65 mmHg (11/19 0337) SpO2:  [91 %-95 %] 92 % (11/19 0337)  Intake/Output from previous day: 11/18 0701 - 11/19 0700 In: 1220 [P.O.:720; I.V.:500] Out: 1080 [Urine:1080] Intake/Output this shift: Total I/O In: -  Out: 550 [Urine:550]  Abdomen soft. Wound healing. Palpable pedal pulses  Lab Results:  Recent Labs  11/09/14 0426 11/10/14 0405  WBC 16.0* 12.9*  HGB 13.4 12.7*  HCT 39.4 37.4*  PLT 162 120*   BMET  Recent Labs  11/09/14 0426 11/10/14 0405  NA 140 142  K 4.0 4.3  CL 103 107  CO2 22 22  GLUCOSE 144* 158*  BUN 23 21  CREATININE 1.57* 1.58*  CALCIUM 8.4 8.3*    Studies/Results: Dg Chest 2 View  11/01/2014   CLINICAL DATA:  Preoperative exam prior to abdominal aortic aneurysm repair; of COPD. , diabetes, and hypertension ; history previous granulomatous infection.  EXAM: CHEST  2 VIEW  COMPARISON:  PA and lateral chest x-ray of June 15, 2012 and chest CT scan of September 21, 2014  FINDINGS: The lungs are mildly hyperinflated with hemidiaphragm flattening. There is no focal infiltrate. There is a stable 4 mm calcified nodule in the right upper lobe. The heart is normal in size. The pulmonary vascularity is not engorged. There is tortuosity of the descending thoracic aorta. There is no pleural effusion or pneumothorax. There is [name] deformity of the lateral aspect of the left seventh rib.  IMPRESSION: COPD. There is no evidence of pneumonia nor CHF nor other acute cardiopulmonary disease. There is evidence of previous granulomatous infection which is stable.   Electronically Signed   By: David  Martinique   On: 11/01/2014 11:00   Dg Chest Port 1  View  11/09/2014   CLINICAL DATA:  One day postop abdominal aortic aneurysm repair. Follow-up pulmonary vascular congestion.  EXAM: PORTABLE CHEST - 1 VIEW  COMPARISON:  Portable chest x-ray yesterday. Two-view chest x-ray 11/01/2014, 06/15/2012.  FINDINGS: Interval resolution of pulmonary venous hypertension identified yesterday. Suboptimal inspiration accounts for atelectasis in the lung bases. Stable calcified granuloma in the right upper lobe. Lungs otherwise clear. Right jugular introducer sheath tip projects over the upper SVC. Nasogastric tube courses below the diaphragm into the stomach.  IMPRESSION: 1.  Support apparatus satisfactory. 2. Resolution of pulmonary venous hypertension. Suboptimal inspiration accounts for bibasilar atelectasis. 3.  No acute cardiopulmonary disease otherwise.   Electronically Signed   By: Evangeline Dakin M.D.   On: 11/09/2014 08:11   Dg Chest Port 1 View  11/08/2014   CLINICAL DATA:  Initial evaluation status post abdominal aortic aneurysm repair  EXAM: PORTABLE CHEST - 1 VIEW  COMPARISON:  11/01/2014  FINDINGS: There is significant increase in the width of the mediastinum and increased prominence of the aortic arch and descending aorta. Cardiac size is mildly increased. There is moderate vascular congestion with no definite pulmonary edema. There is no consolidation or effusion. An NG tube crosses into the stomach. A right IJ vascular sheath is present.  IMPRESSION: Postoperative vascular congestion with no definite pulmonary edema.  Mediastinal widening with prominence of the aorta postoperatively.   Electronically Signed   By: Skipper Cliche M.D.   On: 11/08/2014 13:38  Dg Abd Portable 1v  11/08/2014   CLINICAL DATA:  Initial evaluation status post abdominal aortic aneurysm repair  EXAM: PORTABLE ABDOMEN - 1 VIEW  COMPARISON:  None.  FINDINGS: NG tube projects over the duodenum bulb. Surgical clips project to the left of the lumbar spine. No abnormally dilated  loops of bowel. Aneurysm of the aorta is detected to the left of the lumbar spine.  IMPRESSION: Nonobstructive bowel gas pattern postoperatively.   Electronically Signed   By: Skipper Cliche M.D.   On: 11/08/2014 13:39   Anti-infectives: Anti-infectives    Start     Dose/Rate Route Frequency Ordered Stop   11/08/14 2000  cefUROXime (ZINACEF) 1.5 g in dextrose 5 % 50 mL IVPB     1.5 g100 mL/hr over 30 Minutes Intravenous Every 12 hours 11/08/14 1321 11/09/14 0847   11/07/14 1512  cefUROXime (ZINACEF) 1.5 g in dextrose 5 % 50 mL IVPB     1.5 g100 mL/hr over 30 Minutes Intravenous 30 min pre-op 11/07/14 1512 11/08/14 0756      Assessment/Plan: s/p Procedure(s): ANEURYSM ABDOMINAL AORTIC REPAIR (N/A) Stable postop day #3. Advance diet. Saline lock IV. Possible home in the morning. Discussed with the patient and his wife present.   LOS: 3 days   David [name] 11/11/2014, 6:39 AM

## 2014-11-11 NOTE — Plan of Care (Signed)
Problem: Phase I Progression Outcomes Goal: Tubes/drains patent Outcome: Not Applicable Date Met:  01/59/96

## 2014-11-11 NOTE — Plan of Care (Signed)
Problem: Phase I Progression Outcomes Goal: Voiding-avoid urinary catheter unless indicated Outcome: Completed/Met Date Met:  11/11/14     

## 2014-11-11 NOTE — Care Management Note (Addendum)
    Page 1 of 2   11/12/2014     11:49:45 AM CARE MANAGEMENT NOTE 11/12/2014  Patient:  David [name], David [name]   Account Number:  [id number]  Date Initiated:  11/10/2014  Documentation initiated by:  MAYO,HENRIETTA  Subjective/Objective Assessment:   S/P AAA repair; lives with spouse    PCP  [name]     Action/Plan:   Anticipated DC Date:  11/12/2014   Anticipated DC Plan:  [place]  CM consult      [facility] Choice  HOME HEALTH   Choice offered to / List presented to:  C-3 Spouse        HH arranged  HH-2 PT      [place].   Status of service:  Completed, signed off Medicare Important Message given?  YES (If response is "NO", the following Medicare IM given date fields will be blank) Date Medicare IM given:  11/11/2014 Medicare IM given by:  MAYO,HENRIETTA Date Additional Medicare IM given:   Additional Medicare IM given by:    Discharge Disposition:  [place]  Per UR Regulation:  Reviewed for med. necessity/level of care/duration of stay  If discussed at [place] of Stay Meetings, dates discussed:    Comments:  11/12/14- [place], BSN [phone] Pt for d/c today- order in for HH-PT- referral called to Amy with [facility]- services to begin within 24-48 post discharge.  11/11/14 [place] MSN BSN CCM Per OT, no follow-up needed.  Pt OOB to chair, reports feeling well.  Need order for home health PT, F2F not needed.  11/10/14 1120 [place] MSN BSN CCM Spouse available to assist pt 24/7, pt will need home health PT, OT eval pending.  Pt had home health services previously with [facility] and requests referral to that agency.

## 2014-11-11 NOTE — Progress Notes (Signed)
Medicare Important Message given? YES  (If response is "NO", the following Medicare IM given date fields will be blank)  Date Medicare IM given: 11/11/14 Medicare IM given by:  Dahlia [employer]

## 2014-11-12 ENCOUNTER — Telehealth: Payer: Self-pay | Admitting: Vascular Surgery

## 2014-11-12 LAB — CBC
HCT: 34.2 % — ABNORMAL LOW (ref 39.0–52.0)
Hemoglobin: 11.5 g/dL — ABNORMAL LOW (ref 13.0–17.0)
MCH: 30.7 pg (ref 26.0–34.0)
MCHC: 33.6 g/dL (ref 30.0–36.0)
MCV: 91.4 fL (ref 78.0–100.0)
PLATELETS: 146 10*3/uL — AB (ref 150–400)
RBC: 3.74 MIL/uL — AB (ref 4.22–5.81)
RDW: 13.8 % (ref 11.5–15.5)
WBC: 6.3 10*3/uL (ref 4.0–10.5)

## 2014-11-12 LAB — BASIC METABOLIC PANEL
Anion gap: 15 (ref 5–15)
BUN: 20 mg/dL (ref 6–23)
CO2: 22 mEq/L (ref 19–32)
CREATININE: 1.51 mg/dL — AB (ref 0.50–1.35)
Calcium: 8.4 mg/dL (ref 8.4–10.5)
Chloride: 102 mEq/L (ref 96–112)
GFR, EST AFRICAN AMERICAN: 49 mL/min — AB (ref >90)
GFR, EST NON AFRICAN AMERICAN: 42 mL/min — AB (ref >90)
Glucose, Bld: 136 mg/dL — ABNORMAL HIGH (ref 70–99)
POTASSIUM: 3.8 meq/L (ref 3.7–5.3)
Sodium: 139 mEq/L (ref 137–147)

## 2014-11-12 LAB — GLUCOSE, CAPILLARY
GLUCOSE-CAPILLARY: 125 mg/dL — AB (ref 70–99)
Glucose-Capillary: 130 mg/dL — ABNORMAL HIGH (ref 70–99)

## 2014-11-12 MED ORDER — OXYCODONE HCL 5 MG PO TABS: 5.0000 mg | ORAL_TABLET | Freq: Four times a day (QID) | ORAL | Status: DC | PRN

## 2014-11-12 NOTE — Telephone Encounter (Addendum)
-----   Message from Mena Goes, RN sent at 11/12/2014  9:57 AM EST ----- Regarding: Schedule   ----- Message -----    From: Gabriel Earing, PA-C    Sent: 11/12/2014   8:05 AM      To: Vvs Charge Pool  S/p AAA.  F/u with Dr. Donnetta Hutching in 2 weeks.  Thanks, Aldona Bar  notified patient of post op visit with dr. early on 11-30-14 3:45

## 2014-11-12 NOTE — Discharge Summary (Signed)
AAA Discharge Summary    David [name] [birth year] 80 y.o. male  [id number]  Admission Date: 11/08/2014  Discharge Date: 11/12/14  Physician: Rosetta Posner, MD  Admission Diagnosis: abdominal aortic aneurysm   HPI:   This is a 80 y.o. male Here today for continued discussion of his 6.3 cm infrarenal aneurysm. He has had cardiac evaluation and clearance with a low risk Myoview study. He is here today with his wife. I discussed again the significance of his aneurysm and that he is not a candidate for standard stent graft repair. We had a long discussion regarding the option of fenestrated graft with the recent approval of this device. I did offer for referral to Dr. Trula Slade for continued discussion of this. I did discussion the recovery from open aneurysm repair and fillet he would be an acceptable candidate for this. He and his wife wish to proceed with open repair.   Hospital Course:  The patient was admitted to the hospital and taken to the operating room on 11/08/2014 and underwent: open resection and grafting of juxtarenal abdominal aortic aneurysm with straight graft repair with a 24 mm Hemashield graft    The pt tolerated the procedure well and was transported to the PACU in good condition. He did well postoperatively and was transferred to the telemetry unit on POD 2.  On POD 2, he did have some nausea, but this was improved from the day before.  He was advanced to clear liquids.  He did have good UOP and creatinine was stable, but elevated from baseline.  On POD 3, his diet was advanced and he tolerated this well.  By POD 4, he was doing quite well.  His creatinine was continuing to improve and he is discharged home.  The remainder of the hospital course consisted of increasing mobilization and increasing intake of solids without difficulty.  CBC    Component Value Date/Time   WBC 6.3 11/12/2014 0435   WBC 4.2 01/17/2011 0844   RBC 3.74* 11/12/2014 0435   RBC  4.26 01/17/2011 0844   HGB 11.5* 11/12/2014 0435   HGB 14.1 01/17/2011 0844   HCT 34.2* 11/12/2014 0435   HCT 40.6 01/17/2011 0844   PLT 146* 11/12/2014 0435   PLT 152 01/17/2011 0844   MCV 91.4 11/12/2014 0435   MCV 95.3 01/17/2011 0844   MCH 30.7 11/12/2014 0435   MCH 33.8* 07/04/2010 1352   MCHC 33.6 11/12/2014 0435   MCHC 34.7 01/17/2011 0844   RDW 13.8 11/12/2014 0435   RDW 13.6 01/17/2011 0844   LYMPHSABS 0.9 01/17/2011 0844   MONOABS 0.7 01/17/2011 0844   EOSABS 0.1 01/17/2011 0844   BASOSABS 0.0 01/17/2011 0844    BMET    Component Value Date/Time   NA 139 11/12/2014 0435   K 3.8 11/12/2014 0435   CL 102 11/12/2014 0435   CO2 22 11/12/2014 0435   GLUCOSE 136* 11/12/2014 0435   BUN 20 11/12/2014 0435   CREATININE 1.51* 11/12/2014 0435   CREATININE 0.84 09/15/2014 1400   CALCIUM 8.4 11/12/2014 0435   GFRNONAA 42* 11/12/2014 0435   GFRAA 49* 11/12/2014 0435     Discharge Instructions:   The patient is discharged with extensive instructions on wound care and progressive ambulation.  They are instructed not to drive or perform any heavy lifting until returning to see the physician in his office.  Discharge Instructions    ABDOMINAL PROCEDURE/ANEURYSM REPAIR/AORTO-BIFEMORAL BYPASS:  Call MD for increased abdominal pain; cramping diarrhea; nausea/vomiting  Complete by:  As directed      Call MD for:  redness, tenderness, or signs of infection (pain, swelling, bleeding, redness, odor or green/yellow discharge around incision site)    Complete by:  As directed      Call MD for:  severe or increased pain, loss or decreased feeling  in affected limb(s)    Complete by:  As directed      Call MD for:  temperature >100.5    Complete by:  As directed      Discharge wound care:    Complete by:  As directed   Shower daily with soap and water starting 11/12/14     Driving Restrictions    Complete by:  As directed   No driving for 2 weeks     Lifting restrictions     Complete by:  As directed   No lifting for 4 weeks     Resume previous diet    Complete by:  As directed            Discharge Diagnosis:  abdominal aortic aneurysm  Secondary Diagnosis: Patient Active Problem List   Diagnosis Date Noted  . Abdominal aortic aneurysm without rupture 11/08/2014  . Occlusion and stenosis of carotid artery without mention of cerebral infarction 09/21/2014  . AAA (abdominal aortic aneurysm) without rupture 08/18/2013  . Abdominal aneurysm without mention of rupture 02/17/2013  . Type II or unspecified type diabetes mellitus without mention of complication, not stated as uncontrolled 06/14/2012  . HTN (hypertension) 06/14/2012  . Dyslipidemia 06/14/2012  . Community acquired pneumonia 06/13/2012  . COLON CANCER 01/04/2009  . CVA 01/04/2009  . EMPHYSEMA 01/04/2009  . COLON CANCER, PERSONAL HX 01/04/2009  . THYROID CANCER, HX OF 01/04/2009   Past Medical History  Diagnosis Date  . COPD (chronic obstructive pulmonary disease)   . Hypertension   . Gastroesophageal reflux disease   . Hyperlipidemia   . AAA (abdominal aortic aneurysm)   . Thyroid disease   . Lung nodules   . Diabetes mellitus   . ED (erectile dysfunction)   . BPH (benign prostatic hyperplasia)   . Positional vertigo     intermittent   . Osteopenia   . AAA (abdominal aortic aneurysm)   . Vitamin D deficiency   . Atherosclerosis     pt denies this  . Peripheral vascular disease   . Colon cancer   . Thyroid cancer   . Stroke   . Stroke   . Pneumonia June 2013  . AAA (abdominal aortic aneurysm)   . Type 2 diabetes mellitus   . CKD (chronic kidney disease), stage III     family reports caused by scans but that has resolved since CT scans have stopped  . Shortness of breath     chronic per patient       Medication List    TAKE these medications        acetaminophen 500 MG tablet  Commonly known as:  TYLENOL  Take 500 mg by mouth at bedtime. For pain      clopidogrel 75 MG tablet  Commonly known as:  PLAVIX  Take 75 mg by mouth daily.     ipratropium-albuterol 0.5-2.5 (3) MG/3ML Soln  Commonly known as:  DUONEB  Take 3 mLs by nebulization every 12 (twelve) hours as needed.     COMBIVENT IN  Inhale 2 puffs into the lungs 3 (three) times daily.     fluocinonide 0.05 % external solution  Commonly known as:  LIDEX  Apply 1 application topically 2 (two) times daily as needed. For irritation     Fluticasone-Salmeterol 250-50 MCG/DOSE Aepb  Commonly known as:  ADVAIR  Inhale 1 puff into the lungs every 12 (twelve) hours.     ipratropium 0.02 % nebulizer solution  Commonly known as:  ATROVENT  Take 500 mcg by nebulization 2 (two) times daily.     levothyroxine 125 MCG tablet  Commonly known as:  SYNTHROID, LEVOTHROID  Take 125 mcg by mouth daily.     losartan-hydrochlorothiazide 100-25 MG per tablet  Commonly known as:  HYZAAR  Take 0.5 tablets by mouth daily.     mirtazapine 15 MG tablet  Commonly known as:  REMERON  Take 15 mg by mouth at bedtime.     niacin 500 MG CR tablet  Commonly known as:  NIASPAN  Take 500 mg by mouth at bedtime.     omeprazole 20 MG capsule  Commonly known as:  PRILOSEC  Take 20 mg by mouth daily.     oxyCODONE 5 MG immediate release tablet  Commonly known as:  Oxy IR/ROXICODONE  Take 1 tablet (5 mg total) by mouth every 6 (six) hours as needed for moderate pain.     rosuvastatin 20 MG tablet  Commonly known as:  CRESTOR  Take 20 mg by mouth daily.     sitaGLIPtin 100 MG tablet  Commonly known as:  JANUVIA  Take 50 mg by mouth daily.     triamcinolone cream 0.1 %  Commonly known as:  KENALOG  Apply 1 application topically 2 (two) times daily.     Vitamin D (Ergocalciferol) 50000 UNITS Caps capsule  Commonly known as:  DRISDOL  Take 50,000 Units by mouth every 7 (seven) days.        Roxicodone #30 No Refill  Disposition: home  Patient's condition: is Good  Follow up: 1. Dr.  Donnetta Hutching  in 2 weeks   Leontine Locket, PA-C [facility] [phone] 11/12/2014  8:11 AM   - For VQI Registry use ---   Post-op:  Time to Extubation: [x]  In OR, [ ]  < 12 hrs, [ ]  12-24 hrs, [ ]  >=24 hrs Vasopressors Req. Post-op: No ICU Stay: 2 day in ICU Transfusion: No  If yes, n/a units given MI: No, [ ]  Troponin only, [ ]  EKG or Clinical New Arrhythmia: No  Complications: CHF: No Resp failure: No, [ ]  Pneumonia, [ ]  Ventilator Chg in renal function: No, [ ]  Inc. Cr > 0.5, [ ]  Temp. Dialysis, [ ]  Permanent dialysis Leg ischemia: No, no Surgery needed, [ ]  Yes, Surgery needed, [ ]  Amputation Bowel ischemia: No, [ ]  Medical Rx, [ ]  Surgical Rx Wound complication: No, [ ]  Superficial separation/infection, [ ]  Return to OR Return to OR: No  Return to OR for bleeding: No Stroke: No, [ ]  Minor, [ ]  Major  Discharge medications: Statin use:  Yes If No: [ ]  For Medical reasons, [ ]  Non-compliant ASA use:  No  If No: [ ]  For Medical reasons, [ ]  Non-compliant Plavix use:  Yes If No: [ ]  For Medical reasons, [ ]  Non-compliant Beta blocker use:  No If No: [ ]  For Medical reasons, [ ]  Non-compliant

## 2014-11-12 NOTE — Progress Notes (Addendum)
  AAA Progress Note    11/12/2014 8:01 AM 4 Days Post-Op  Subjective:  Ready to go home  Afebrile VSS 94% RA  Filed Vitals:   11/12/14 0450  BP: 156/75  Pulse: 80  Temp: 97.9 F (36.6 C)  Resp: 18    Physical Exam: Cardiac:  regular Lungs:  CTAB Abdomen:  Soft, NT, ND, +BM Incisions:  Midline incision is c/d/i with steri strips in place Extremities:  Bilateral feet are warm CBC    Component Value Date/Time   WBC 6.3 11/12/2014 0435   WBC 4.2 01/17/2011 0844   RBC 3.74* 11/12/2014 0435   RBC 4.26 01/17/2011 0844   HGB 11.5* 11/12/2014 0435   HGB 14.1 01/17/2011 0844   HCT 34.2* 11/12/2014 0435   HCT 40.6 01/17/2011 0844   PLT 146* 11/12/2014 0435   PLT 152 01/17/2011 0844   MCV 91.4 11/12/2014 0435   MCV 95.3 01/17/2011 0844   MCH 30.7 11/12/2014 0435   MCH 33.8* 07/04/2010 1352   MCHC 33.6 11/12/2014 0435   MCHC 34.7 01/17/2011 0844   RDW 13.8 11/12/2014 0435   RDW 13.6 01/17/2011 0844   LYMPHSABS 0.9 01/17/2011 0844   MONOABS 0.7 01/17/2011 0844   EOSABS 0.1 01/17/2011 0844   BASOSABS 0.0 01/17/2011 0844    BMET    Component Value Date/Time   NA 139 11/12/2014 0435   K 3.8 11/12/2014 0435   CL 102 11/12/2014 0435   CO2 22 11/12/2014 0435   GLUCOSE 136* 11/12/2014 0435   BUN 20 11/12/2014 0435   CREATININE 1.51* 11/12/2014 0435   CREATININE 0.84 09/15/2014 1400   CALCIUM 8.4 11/12/2014 0435   GFRNONAA 42* 11/12/2014 0435   GFRAA 49* 11/12/2014 0435    INR    Component Value Date/Time   INR 1.32 11/08/2014 1150     Intake/Output Summary (Last 24 hours) at 11/12/14 0801 Last data filed at 11/11/14 2321  Gross per 24 hour  Intake   1040 ml  Output    375 ml  Net    665 ml     Assessment/Plan:  80 y.o. male is s/p  AAA repair 4 Days Post-Op  -doing well this am and is tolerating breakfast -creatinine continues to improve -home today -f/u with Dr. Donnetta Hutching in 2 weeks    Leontine Locket, PA-C [facility] [phone] 11/12/2014 8:01 AM  I have examined the patient, reviewed and agree with above.Looks great pod 4.  DC home  EARLY, TODD, MD 11/12/2014 8:44 AM

## 2014-11-12 NOTE — Progress Notes (Signed)
Assessment unchanged. Discussed D/C instructions with pt including f/u appointments and new medications. Verbalized understanding. RX given to pt. IV and tele removed. Pt left with belongings accompanied by Volunteer. 

## 2014-11-29 ENCOUNTER — Encounter: Payer: Self-pay | Admitting: Vascular Surgery

## 2014-11-30 ENCOUNTER — Ambulatory Visit (INDEPENDENT_AMBULATORY_CARE_PROVIDER_SITE_OTHER): Payer: Medicare Other | Admitting: Vascular Surgery

## 2014-11-30 ENCOUNTER — Encounter: Payer: Self-pay | Admitting: Vascular Surgery

## 2014-11-30 VITALS — BP 127/76 | HR 84 | Resp 16 | Ht 72.0 in | Wt 186.0 lb

## 2014-11-30 DIAGNOSIS — I714 Abdominal aortic aneurysm, without rupture, unspecified: Secondary | ICD-10-CM

## 2014-11-30 NOTE — Progress Notes (Signed)
   [facility] [place]  HPI: Mr. Hoeg is here today for his 4 week follow up visit S/P open AAA repair.  He is doing well over all.  His energy is improving daily and her is able to eat well without abdominal pain.     Objective 127/76 84   16     Abdominal incision is well healed  abdomin NTTP with + BS to auscultation Feet are warm and well perfused Lungs CTA  Assessment/Planning: 11/08/2014 open resection and grafting of juxtarenal abdominal aortic aneurysm with straight graft repair with a 24 mm Hemashield graft  He should not lift any heavy objects until he is 3 months post-op.  He can do any other activities he wants at this point including driving.  He was seen in conjunction with Dr. Donnetta Hutching today in clinic.  He will f/u in 3 months.  If they have questions or concerns they will call sooner.  Theda Sers, Overton Boggus MAUREEN PA-C  [place], [place] 11/30/2014 4:06 PM    I have examined the patient, reviewed and agree with above. Continues to do extremely well following open juxtarenal aneurysm repair. We'll see him again in 3 months for continued follow-up  EARLY, TODD, MD 11/30/2014 4:21 PM

## 2014-12-21 ENCOUNTER — Encounter: Payer: Self-pay | Admitting: *Deleted

## 2015-01-22 IMAGING — CR DG ABD PORTABLE 1V
1 series · 1 of 1 positions shown · non-contrast
Comparison: None.

CLINICAL DATA: Initial evaluation status post abdominal aortic
aneurysm repair

EXAM:
PORTABLE ABDOMEN - 1 VIEW

[supine ap]
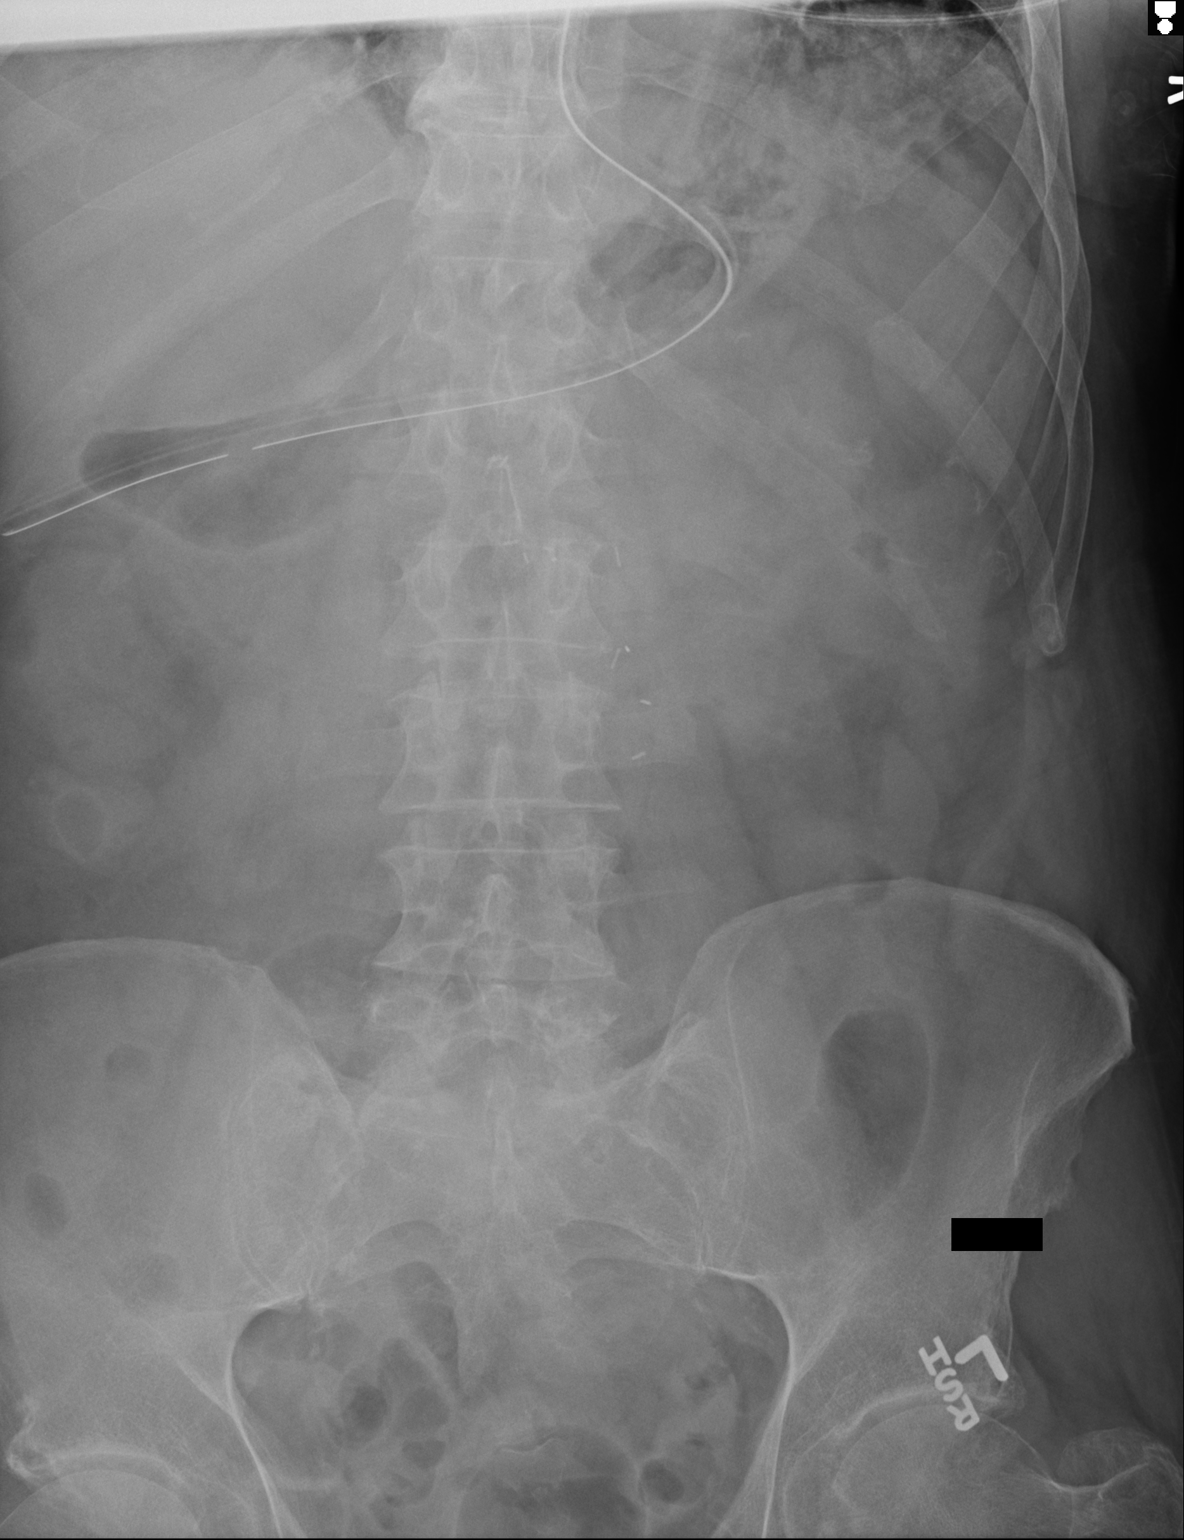

[1 of 1 positions shown; findings below may reference images not displayed]

FINDINGS: NG tube projects over the duodenum bulb. Surgical clips project to
the left of the lumbar spine. No abnormally dilated loops of bowel.
Aneurysm of the aorta is detected to the left of the lumbar spine.
IMPRESSION: Nonobstructive bowel gas pattern postoperatively.

## 2015-02-28 ENCOUNTER — Encounter: Payer: Self-pay | Admitting: Vascular Surgery

## 2015-03-01 ENCOUNTER — Ambulatory Visit (INDEPENDENT_AMBULATORY_CARE_PROVIDER_SITE_OTHER): Payer: Self-pay | Admitting: Vascular Surgery

## 2015-03-01 ENCOUNTER — Encounter: Payer: Self-pay | Admitting: Vascular Surgery

## 2015-03-01 VITALS — BP 119/65 | HR 62 | Ht 72.0 in | Wt 193.0 lb

## 2015-03-01 DIAGNOSIS — I714 Abdominal aortic aneurysm, without rupture, unspecified: Secondary | ICD-10-CM

## 2015-03-01 DIAGNOSIS — Z48812 Encounter for surgical aftercare following surgery on the circulatory system: Secondary | ICD-10-CM

## 2015-03-01 NOTE — Patient Instructions (Addendum)
Kay Valta Dillon, RN [facility]   [phone] ext [phone] 

## 2015-03-01 NOTE — Progress Notes (Signed)
Here today for follow-up of open juxtarenal aneurysm repair. He had a straight graft 24 mm in diameter. Surgery was on 11/08/2014. He has continued to do well. He reports that he has gained the 13 pounds that he lost around the time of surgery. He reports that his stamina is returned to his baseline. He does report some low back discomfort with bending which he feels is a more so than prior to surgery.  Past Medical History  Diagnosis Date  . COPD (chronic obstructive pulmonary disease)   . Hypertension   . Gastroesophageal reflux disease   . Hyperlipidemia   . AAA (abdominal aortic aneurysm)   . Thyroid disease   . Lung nodules   . Diabetes mellitus   . ED (erectile dysfunction)   . BPH (benign prostatic hyperplasia)   . Positional vertigo     intermittent   . Osteopenia   . AAA (abdominal aortic aneurysm)   . Vitamin D deficiency   . Atherosclerosis     pt denies this  . Peripheral vascular disease   . Colon cancer   . Thyroid cancer   . Stroke   . Stroke   . Pneumonia June 2013  . AAA (abdominal aortic aneurysm)   . Type 2 diabetes mellitus   . CKD (chronic kidney disease), stage III     family reports caused by scans but that has resolved since CT scans have stopped  . Shortness of breath     chronic per patient    History  Substance Use Topics  . Smoking status: Former Smoker -- 1.50 packs/day for 50 years    Types: Cigarettes    Quit date: 05/28/2005  . Smokeless tobacco: Never Used  . Alcohol Use: No    Family History  Problem Relation Age of Onset  . Stomach cancer Mother   . Heart failure Mother   . Breast cancer Mother   . Pneumonia Father   . Diabetes Paternal Uncle   . Diabetes Brother   . Heart disease Brother     Heart Disease before age 17,  AAA and  Carotid  . Heart attack Brother     No Known Allergies   Current outpatient prescriptions:  .  acetaminophen (TYLENOL) 500 MG tablet, Take 500 mg by mouth at bedtime. For pain, Disp: , Rfl:  .   clopidogrel (PLAVIX) 75 MG tablet, Take 75 mg by mouth daily., Disp: , Rfl:  .  fluocinonide (LIDEX) 0.05 % external solution, Apply 1 application topically 2 (two) times daily as needed. For irritation, Disp: , Rfl:  .  Fluticasone-Salmeterol (ADVAIR) 250-50 MCG/DOSE AEPB, Inhale 1 puff into the lungs every 12 (twelve) hours., Disp: , Rfl:  .  ibandronate (BONIVA) 150 MG tablet, Take 150 mg by mouth every 30 (thirty) days. , Disp: , Rfl: 3 .  ipratropium (ATROVENT) 0.02 % nebulizer solution, Take 500 mcg by nebulization 2 (two) times daily., Disp: , Rfl:  .  Ipratropium-Albuterol (COMBIVENT IN), Inhale 2 puffs into the lungs 3 (three) times daily. , Disp: , Rfl:  .  ipratropium-albuterol (DUONEB) 0.5-2.5 (3) MG/3ML SOLN, Take 3 mLs by nebulization every 12 (twelve) hours as needed., Disp: , Rfl:  .  levothyroxine (SYNTHROID, LEVOTHROID) 125 MCG tablet, Take 125 mcg by mouth daily., Disp: , Rfl:  .  losartan-hydrochlorothiazide (HYZAAR) 100-25 MG per tablet, Take 0.5 tablets by mouth daily. , Disp: , Rfl:  .  mirtazapine (REMERON) 15 MG tablet, Take 15 mg by mouth at bedtime.,  Disp: , Rfl:  .  niacin (NIASPAN) 500 MG CR tablet, Take 500 mg by mouth at bedtime., Disp: , Rfl:  .  omeprazole (PRILOSEC) 20 MG capsule, Take 20 mg by mouth daily., Disp: , Rfl:  .  rosuvastatin (CRESTOR) 20 MG tablet, Take 20 mg by mouth daily., Disp: , Rfl:  .  sitaGLIPtin (JANUVIA) 100 MG tablet, Take 50 mg by mouth daily. , Disp: , Rfl:  .  triamcinolone cream (KENALOG) 0.1 %, Apply 1 application topically 2 (two) times daily., Disp: , Rfl:  .  Vitamin D, Ergocalciferol, (DRISDOL) 50000 UNITS CAPS, Take 50,000 Units by mouth every 7 (seven) days., Disp: , Rfl:  .  oxyCODONE (OXY IR/ROXICODONE) 5 MG immediate release tablet, Take 1 tablet (5 mg total) by mouth every 6 (six) hours as needed for moderate pain. (Patient not taking: Reported on 03/01/2015), Disp: 30 tablet, Rfl: 0  BP 119/65 mmHg  Pulse 62  Ht 6' (1.829 m)   Wt 193 lb (87.544 kg)  BMI 26.17 kg/m2  SpO2 98%  Body mass index is 26.17 kg/(m^2).       Physical exam a well-developed well-nourished gentleman appearing younger than stated age of 81 Abdomen soft nontender no aneurysm palpable. Midline incision is well-healed with no evidence of hernia. 2+ femoral 2+ dorsalis pedis pulses. Popliteal pulses without aneurysm  Impression and plan continued a good recovery following surgery. Will resume full activities without limitation. We will see him in 9 months with final follow-up with ankle arm indices. He'll notify should he develop any difficulty in

## 2015-05-06 DIAGNOSIS — E1121 Type 2 diabetes mellitus with diabetic nephropathy: Secondary | ICD-10-CM | POA: Insufficient documentation

## 2015-05-06 DIAGNOSIS — E1142 Type 2 diabetes mellitus with diabetic polyneuropathy: Secondary | ICD-10-CM | POA: Insufficient documentation

## 2015-05-06 DIAGNOSIS — D692 Other nonthrombocytopenic purpura: Secondary | ICD-10-CM | POA: Insufficient documentation

## 2015-06-13 ENCOUNTER — Encounter: Payer: Self-pay | Admitting: Podiatry

## 2015-06-13 ENCOUNTER — Ambulatory Visit (INDEPENDENT_AMBULATORY_CARE_PROVIDER_SITE_OTHER): Payer: Medicare Other

## 2015-06-13 ENCOUNTER — Ambulatory Visit: Payer: Medicare Other

## 2015-06-13 ENCOUNTER — Ambulatory Visit (INDEPENDENT_AMBULATORY_CARE_PROVIDER_SITE_OTHER): Payer: Medicare Other | Admitting: Podiatry

## 2015-06-13 VITALS — BP 144/70 | HR 50 | Temp 96.9°F | Resp 14

## 2015-06-13 DIAGNOSIS — R52 Pain, unspecified: Secondary | ICD-10-CM

## 2015-06-13 DIAGNOSIS — B351 Tinea unguium: Secondary | ICD-10-CM | POA: Diagnosis not present

## 2015-06-13 DIAGNOSIS — Q6650 Congenital pes planus, unspecified foot: Secondary | ICD-10-CM

## 2015-06-13 DIAGNOSIS — M779 Enthesopathy, unspecified: Secondary | ICD-10-CM | POA: Diagnosis not present

## 2015-06-13 DIAGNOSIS — M79606 Pain in leg, unspecified: Secondary | ICD-10-CM | POA: Diagnosis not present

## 2015-06-13 NOTE — Progress Notes (Signed)
   Subjective:    Patient ID: David [name], male    DOB: [birth year], 81 y.o.   MRN: [id number]  HPI  Patient presents here today with B/L knots on lateral side of feet(has been there all of his life and has been hurting for a long time, sometimes painful. Requesting B/L toenail trim and diabetic shoes.  Review of Systems  Respiratory: Positive for shortness of breath.   Allergic/Immunologic: Positive for environmental allergies.  Neurological: Positive for numbness.       Numbness of feet  Hematological: Bruises/bleeds easily.       Objective:   Physical Exam        Assessment & Plan:

## 2015-06-14 NOTE — Progress Notes (Signed)
Subjective:     Patient ID: David [name], male   DOB: [birth year], 81 y.o.   MRN: [id number]  HPI long-term diabetic on insulin with nodules around the fifth metatarsal base bilateral and thick yellow brittle toenails 1-5 both feet that are painful. States he gets tingling and burning of his feet at night and does not feel them well   Review of Systems     Objective:   Physical Exam Vascular status diminished DP PT pulses with diminishment of sharp [place] vibratory and tactile sensation. Patient's noted to have prominences around the fifth metatarsal base of both feet that are red and painful when pressed and does have nail disease with thickness yellow brittle debris 1-5 of both feet    Assessment:     Mycotic nail infection with pain 1-5 both feet with prominence of the metatarsals and at risk neurological and vascular diabetic disease    Plan:     Diabetic education rendered and we will get him approval for diabetic shoes that he's had in the past and been helpful. Debrided nailbeds 1-5 both feet with no iatrogenic bleeding noted

## 2015-06-23 ENCOUNTER — Encounter (HOSPITAL_COMMUNITY): Payer: Self-pay | Admitting: Emergency Medicine

## 2015-06-23 ENCOUNTER — Inpatient Hospital Stay (HOSPITAL_COMMUNITY)
Admission: EM | Admit: 2015-06-23 | Discharge: 2015-06-25 | DRG: 871 | Disposition: A | Discharge status: Home / Self Care | Payer: Medicare Other | Attending: Internal Medicine | Admitting: Internal Medicine

## 2015-06-23 ENCOUNTER — Emergency Department (HOSPITAL_COMMUNITY): Payer: Medicare Other

## 2015-06-23 DIAGNOSIS — E1165 Type 2 diabetes mellitus with hyperglycemia: Secondary | ICD-10-CM | POA: Diagnosis present

## 2015-06-23 DIAGNOSIS — A419 Sepsis, unspecified organism: Principal | ICD-10-CM | POA: Diagnosis present

## 2015-06-23 DIAGNOSIS — Z8673 Personal history of transient ischemic attack (TIA), and cerebral infarction without residual deficits: Secondary | ICD-10-CM | POA: Diagnosis not present

## 2015-06-23 DIAGNOSIS — I10 Essential (primary) hypertension: Secondary | ICD-10-CM | POA: Diagnosis present

## 2015-06-23 DIAGNOSIS — N179 Acute kidney failure, unspecified: Secondary | ICD-10-CM | POA: Diagnosis not present

## 2015-06-23 DIAGNOSIS — K219 Gastro-esophageal reflux disease without esophagitis: Secondary | ICD-10-CM | POA: Diagnosis present

## 2015-06-23 DIAGNOSIS — D696 Thrombocytopenia, unspecified: Secondary | ICD-10-CM | POA: Diagnosis present

## 2015-06-23 DIAGNOSIS — E785 Hyperlipidemia, unspecified: Secondary | ICD-10-CM | POA: Diagnosis present

## 2015-06-23 DIAGNOSIS — I5032 Chronic diastolic (congestive) heart failure: Secondary | ICD-10-CM | POA: Diagnosis not present

## 2015-06-23 DIAGNOSIS — E039 Hypothyroidism, unspecified: Secondary | ICD-10-CM | POA: Diagnosis present

## 2015-06-23 DIAGNOSIS — N183 Chronic kidney disease, stage 3 unspecified: Secondary | ICD-10-CM | POA: Diagnosis present

## 2015-06-23 DIAGNOSIS — E860 Dehydration: Secondary | ICD-10-CM | POA: Diagnosis present

## 2015-06-23 DIAGNOSIS — J189 Pneumonia, unspecified organism: Secondary | ICD-10-CM | POA: Diagnosis present

## 2015-06-23 DIAGNOSIS — E1122 Type 2 diabetes mellitus with diabetic chronic kidney disease: Secondary | ICD-10-CM | POA: Diagnosis present

## 2015-06-23 DIAGNOSIS — J9601 Acute respiratory failure with hypoxia: Secondary | ICD-10-CM | POA: Diagnosis present

## 2015-06-23 DIAGNOSIS — D72829 Elevated white blood cell count, unspecified: Secondary | ICD-10-CM | POA: Diagnosis present

## 2015-06-23 DIAGNOSIS — J449 Chronic obstructive pulmonary disease, unspecified: Secondary | ICD-10-CM | POA: Diagnosis present

## 2015-06-23 DIAGNOSIS — I129 Hypertensive chronic kidney disease with stage 1 through stage 4 chronic kidney disease, or unspecified chronic kidney disease: Secondary | ICD-10-CM | POA: Diagnosis present

## 2015-06-23 DIAGNOSIS — Z87891 Personal history of nicotine dependence: Secondary | ICD-10-CM

## 2015-06-23 DIAGNOSIS — R0602 Shortness of breath: Secondary | ICD-10-CM | POA: Diagnosis present

## 2015-06-23 DIAGNOSIS — I6350 Cerebral infarction due to unspecified occlusion or stenosis of unspecified cerebral artery: Secondary | ICD-10-CM | POA: Diagnosis present

## 2015-06-23 DIAGNOSIS — N189 Chronic kidney disease, unspecified: Secondary | ICD-10-CM

## 2015-06-23 LAB — HEPATIC FUNCTION PANEL
ALT: 21 U/L (ref 17–63)
AST: 20 U/L (ref 15–41)
Albumin: 4.4 g/dL (ref 3.5–5.0)
Alkaline Phosphatase: 54 U/L (ref 38–126)
BILIRUBIN TOTAL: 1.3 mg/dL — AB (ref 0.3–1.2)
Bilirubin, Direct: 0.3 mg/dL (ref 0.1–0.5)
Indirect Bilirubin: 1 mg/dL — ABNORMAL HIGH (ref 0.3–0.9)
TOTAL PROTEIN: 7.1 g/dL (ref 6.5–8.1)

## 2015-06-23 LAB — CBC
HCT: 43.6 % (ref 39.0–52.0)
HEMOGLOBIN: 14.6 g/dL (ref 13.0–17.0)
MCH: 32.2 pg (ref 26.0–34.0)
MCHC: 33.5 g/dL (ref 30.0–36.0)
MCV: 96.2 fL (ref 78.0–100.0)
PLATELETS: 140 10*3/uL — AB (ref 150–400)
RBC: 4.53 MIL/uL (ref 4.22–5.81)
RDW: 13.9 % (ref 11.5–15.5)
WBC: 15.6 10*3/uL — ABNORMAL HIGH (ref 4.0–10.5)

## 2015-06-23 LAB — BASIC METABOLIC PANEL
ANION GAP: 10 (ref 5–15)
BUN: 37 mg/dL — ABNORMAL HIGH (ref 6–20)
CO2: 23 mmol/L (ref 22–32)
CREATININE: 1.9 mg/dL — AB (ref 0.61–1.24)
Calcium: 9.3 mg/dL (ref 8.9–10.3)
Chloride: 105 mmol/L (ref 101–111)
GFR calc Af Amer: 36 mL/min — ABNORMAL LOW (ref >60)
GFR calc non Af Amer: 31 mL/min — ABNORMAL LOW (ref >60)
GLUCOSE: 173 mg/dL — AB (ref 65–99)
Potassium: 4.2 mmol/L (ref 3.5–5.1)
Sodium: 138 mmol/L (ref 135–145)

## 2015-06-23 LAB — GLUCOSE, CAPILLARY
Glucose-Capillary: 174 mg/dL — ABNORMAL HIGH (ref 65–99)
Glucose-Capillary: 146 mg/dL — ABNORMAL HIGH (ref 65–99)

## 2015-06-23 LAB — I-STAT TROPONIN, ED: Troponin i, poc: 0 ng/mL (ref 0.00–0.08)

## 2015-06-23 LAB — STREP PNEUMONIAE URINARY ANTIGEN: Strep Pneumo Urinary Antigen: NEGATIVE

## 2015-06-23 LAB — URINALYSIS, ROUTINE W REFLEX MICROSCOPIC
Bilirubin Urine: NEGATIVE
Glucose, UA: NEGATIVE mg/dL
HGB URINE DIPSTICK: NEGATIVE
Ketones, ur: NEGATIVE mg/dL
LEUKOCYTES UA: NEGATIVE
Nitrite: NEGATIVE
Protein, ur: NEGATIVE mg/dL
Specific Gravity, Urine: 1.019 (ref 1.005–1.030)
Urobilinogen, UA: 0.2 mg/dL (ref 0.0–1.0)
pH: 5 (ref 5.0–8.0)

## 2015-06-23 LAB — PROTIME-INR
INR: 1.32 (ref 0.00–1.49)
Prothrombin Time: 16.6 seconds — ABNORMAL HIGH (ref 11.6–15.2)

## 2015-06-23 LAB — TSH: TSH: 0.929 u[IU]/mL (ref 0.350–4.500)

## 2015-06-23 LAB — APTT: aPTT: 38 seconds — ABNORMAL HIGH (ref 24–37)

## 2015-06-23 LAB — PROCALCITONIN: Procalcitonin: 3.72 ng/mL

## 2015-06-23 LAB — I-STAT CG4 LACTIC ACID, ED: LACTIC ACID, VENOUS: 1.47 mmol/L (ref 0.5–2.0)

## 2015-06-23 LAB — LIPASE, BLOOD: LIPASE: 12 U/L — AB (ref 22–51)

## 2015-06-23 MED ORDER — LEVOTHYROXINE SODIUM 125 MCG PO TABS
125.0000 ug | ORAL_TABLET | ORAL | Status: DC
  Administered 2015-06-23 – 2015-06-25 (×3): 125 ug via ORAL
  Filled 2015-06-23 (×4): qty 1

## 2015-06-23 MED ORDER — ONDANSETRON HCL 4 MG/2ML IJ SOLN
4.0000 mg | Freq: Four times a day (QID) | INTRAMUSCULAR | Status: DC | PRN
  Administered 2015-06-23: 4 mg via INTRAVENOUS
  Filled 2015-06-23: qty 2

## 2015-06-23 MED ORDER — IPRATROPIUM-ALBUTEROL 0.5-2.5 (3) MG/3ML IN SOLN
3.0000 mL | RESPIRATORY_TRACT | Status: DC | PRN
  Administered 2015-06-24: 3 mL via RESPIRATORY_TRACT

## 2015-06-23 MED ORDER — DEXTROSE 5 % IV SOLN
500.0000 mg | INTRAVENOUS | Status: DC
  Administered 2015-06-24 – 2015-06-25 (×2): 500 mg via INTRAVENOUS
  Filled 2015-06-23 (×2): qty 500

## 2015-06-23 MED ORDER — SODIUM CHLORIDE 0.9 % IV SOLN
INTRAVENOUS | Status: AC
  Administered 2015-06-23: 11:00:00 via INTRAVENOUS

## 2015-06-23 MED ORDER — CEFTRIAXONE SODIUM 1 G IJ SOLR: 1.0000 g | Freq: Once | INTRAMUSCULAR | Status: DC

## 2015-06-23 MED ORDER — INSULIN DETEMIR 100 UNIT/ML FLEXPEN: 22.0000 [IU] | PEN_INJECTOR | Freq: Every day | SUBCUTANEOUS | Status: DC

## 2015-06-23 MED ORDER — ONDANSETRON HCL 4 MG PO TABS: 4.0000 mg | ORAL_TABLET | Freq: Four times a day (QID) | ORAL | Status: DC | PRN

## 2015-06-23 MED ORDER — INSULIN ASPART 100 UNIT/ML ~~LOC~~ SOLN
6.0000 [IU] | Freq: Three times a day (TID) | SUBCUTANEOUS | Status: DC
  Administered 2015-06-23 – 2015-06-25 (×6): 6 [IU] via SUBCUTANEOUS

## 2015-06-23 MED ORDER — MIRTAZAPINE 15 MG PO TABS
15.0000 mg | ORAL_TABLET | Freq: Every day | ORAL | Status: DC
  Administered 2015-06-23 – 2015-06-24 (×2): 15 mg via ORAL
  Filled 2015-06-23 (×3): qty 1

## 2015-06-23 MED ORDER — ROSUVASTATIN CALCIUM 10 MG PO TABS
10.0000 mg | ORAL_TABLET | Freq: Every day | ORAL | Status: DC
  Administered 2015-06-23 – 2015-06-24 (×2): 10 mg via ORAL
  Filled 2015-06-23 (×3): qty 1

## 2015-06-23 MED ORDER — DEXTROSE 5 % IV SOLN: 500.0000 mg | INTRAVENOUS | Status: DC

## 2015-06-23 MED ORDER — ALBUTEROL SULFATE (2.5 MG/3ML) 0.083% IN NEBU: 5.0000 mg | INHALATION_SOLUTION | RESPIRATORY_TRACT | Status: AC | PRN

## 2015-06-23 MED ORDER — INSULIN DETEMIR 100 UNIT/ML ~~LOC~~ SOLN
22.0000 [IU] | Freq: Every day | SUBCUTANEOUS | Status: DC
  Administered 2015-06-23 – 2015-06-24 (×2): 22 [IU] via SUBCUTANEOUS
  Filled 2015-06-23 (×2): qty 0.22

## 2015-06-23 MED ORDER — CLOPIDOGREL BISULFATE 75 MG PO TABS
75.0000 mg | ORAL_TABLET | Freq: Every day | ORAL | Status: DC
  Administered 2015-06-23 – 2015-06-25 (×3): 75 mg via ORAL
  Filled 2015-06-23 (×3): qty 1

## 2015-06-23 MED ORDER — DEXTROSE 5 % IV SOLN
500.0000 mg | Freq: Once | INTRAVENOUS | Status: AC
  Administered 2015-06-23: 500 mg via INTRAVENOUS
  Filled 2015-06-23: qty 500

## 2015-06-23 MED ORDER — IPRATROPIUM-ALBUTEROL 0.5-2.5 (3) MG/3ML IN SOLN
3.0000 mL | RESPIRATORY_TRACT | Status: DC
  Administered 2015-06-23 (×2): 3 mL via RESPIRATORY_TRACT
  Filled 2015-06-23: qty 3

## 2015-06-23 MED ORDER — DEXTROSE 5 % IV SOLN: 1.0000 g | INTRAVENOUS | Status: DC

## 2015-06-23 MED ORDER — ACETAMINOPHEN 500 MG PO TABS: 500.0000 mg | ORAL_TABLET | Freq: Every day | ORAL | Status: DC

## 2015-06-23 MED ORDER — INSULIN ASPART 100 UNIT/ML ~~LOC~~ SOLN
0.0000 [IU] | Freq: Three times a day (TID) | SUBCUTANEOUS | Status: DC
  Administered 2015-06-23 – 2015-06-24 (×2): 3 [IU] via SUBCUTANEOUS
  Administered 2015-06-24 – 2015-06-25 (×2): 2 [IU] via SUBCUTANEOUS

## 2015-06-23 MED ORDER — ACETAMINOPHEN 500 MG PO TABS
500.0000 mg | ORAL_TABLET | Freq: Every day | ORAL | Status: DC
  Administered 2015-06-23 – 2015-06-24 (×2): 1000 mg via ORAL
  Filled 2015-06-23 (×3): qty 2

## 2015-06-23 MED ORDER — LEVOTHYROXINE SODIUM 125 MCG PO TABS: 125.0000 ug | ORAL_TABLET | Freq: Every day | ORAL | Status: DC

## 2015-06-23 MED ORDER — IPRATROPIUM-ALBUTEROL 0.5-2.5 (3) MG/3ML IN SOLN
3.0000 mL | RESPIRATORY_TRACT | Status: DC
  Administered 2015-06-23 – 2015-06-24 (×6): 3 mL via RESPIRATORY_TRACT
  Filled 2015-06-23 (×7): qty 3

## 2015-06-23 MED ORDER — SACCHAROMYCES BOULARDII 250 MG PO CAPS
250.0000 mg | ORAL_CAPSULE | Freq: Two times a day (BID) | ORAL | Status: DC
  Administered 2015-06-23 – 2015-06-25 (×4): 250 mg via ORAL
  Filled 2015-06-23 (×5): qty 1

## 2015-06-23 MED ORDER — TRIAMCINOLONE ACETONIDE 0.1 % EX CREA: 1.0000 "application " | TOPICAL_CREAM | Freq: Two times a day (BID) | CUTANEOUS | Status: DC | PRN

## 2015-06-23 MED ORDER — IPRATROPIUM-ALBUTEROL 0.5-2.5 (3) MG/3ML IN SOLN
3.0000 mL | Freq: Once | RESPIRATORY_TRACT | Status: AC
  Administered 2015-06-23: 3 mL via RESPIRATORY_TRACT
  Filled 2015-06-23: qty 3

## 2015-06-23 MED ORDER — PANTOPRAZOLE SODIUM 40 MG PO TBEC
40.0000 mg | DELAYED_RELEASE_TABLET | Freq: Every day | ORAL | Status: DC
  Administered 2015-06-23 – 2015-06-25 (×3): 40 mg via ORAL
  Filled 2015-06-23 (×2): qty 1

## 2015-06-23 MED ORDER — CEFTRIAXONE SODIUM 1 G IJ SOLR
1.0000 g | INTRAMUSCULAR | Status: DC
  Administered 2015-06-23 – 2015-06-24 (×2): 1 g via INTRAVENOUS
  Filled 2015-06-23 (×3): qty 10

## 2015-06-23 MED ORDER — IPRATROPIUM-ALBUTEROL 0.5-2.5 (3) MG/3ML IN SOLN
3.0000 mL | Freq: Once | RESPIRATORY_TRACT | Status: AC
  Filled 2015-06-23: qty 3

## 2015-06-23 NOTE — ED Provider Notes (Signed)
Medical screening examination/treatment/procedure(s) were conducted as a shared visit with non-physician practitioner(s) and myself.  I personally evaluated the patient during the encounter.   Pt presented to the ED with complaints of chills, nausea and vomiting and diarrhea.  Recent cough.  Gen: alert, no distress Car  Regular rate Lungs tachypnea   EKG Interpretation   Date/Time:  Thursday June 23 2015 06:43:16 EDT Ventricular Rate:  85 PR Interval:  176 QRS Duration: 96 QT Interval:  357 QTC Calculation: 424 R Axis:   54 Text Interpretation:  Sinus rhythm Borderline low voltage, extremity leads  No significant change since last tracing Confirmed by Wilson Singer  MD, STEPHEN  ([id number]) on 06/23/2015 6:50:06 AM Also confirmed by Wilson Singer  MD, STEPHEN  ([id number]), editor Gilford Rile, CCT, [place] ([id number])  on 06/23/2015 7:03:40 AM      Findings reviewed with PA [place].  Notified patient of plan to admit to the hospital for treatment of CAP.  He has not been in the hospital for greater than 90 days.  Appears stable.  Doubt sepsis.  Abx ordered.      Dorie Rank, MD 06/23/15 [phone]

## 2015-06-23 NOTE — ED Notes (Signed)
Bed: VD73 Expected date:  Expected time:  Means of arrival:  Comments: EMS fever, N/V

## 2015-06-23 NOTE — ED Notes (Signed)
Attempted to call report but RN in unavailable at this time.  Will try again in 10 minutes.

## 2015-06-23 NOTE — ED Notes (Signed)
First request for urine sample,pt states can't provide one at this time

## 2015-06-23 NOTE — ED Notes (Signed)
A set of blood cultures already drawn and at bedside.  Provider consulted and does not want any blood cultures at this time.

## 2015-06-23 NOTE — ED Notes (Signed)
Pt given a urinal and made aware of need for a urine sample.  Pt a/o x 4 and currently denies any pain or nausea.

## 2015-06-23 NOTE — ED Notes (Signed)
Pt transported from home with c/o chills onset MN with n/v/d, EMS gave Zofran 4mg  IVP #20 R hand. A & O

## 2015-06-23 NOTE — ED Provider Notes (Signed)
CSN: [id number]     Arrival date & time 06/23/15  0627 History   First MD Initiated Contact with Patient 06/23/15 [phone]     Chief Complaint  Patient presents with  . Emesis     (Consider location/radiation/quality/duration/timing/severity/associated sxs/prior Treatment) The history is provided by the patient and the spouse.     Pt with hx DM, COPD, stroke, colon cancer p/w N/V/D, chills, generalized weakness, decreased appetite.  The weakness and chills began yesterday around 5pm.  Emesis and diarrhea are nonbloody.  Has been coughing slightly more than usual, nonproductive.  Denies CP, abdominal pain, lightheadedness, weakness, urinary symptoms, change in baseline SOB.   Denies abnormal foods.  Has had sick contacts with URIs, not N/V/D.    Past Medical History  Diagnosis Date  . COPD (chronic obstructive pulmonary disease)   . Hypertension   . Gastroesophageal reflux disease   . Hyperlipidemia   . AAA (abdominal aortic aneurysm)   . Thyroid disease   . Lung nodules   . Diabetes mellitus   . ED (erectile dysfunction)   . BPH (benign prostatic hyperplasia)   . Positional vertigo     intermittent   . Osteopenia   . AAA (abdominal aortic aneurysm)   . Vitamin D deficiency   . Atherosclerosis     pt denies this  . Peripheral vascular disease   . Colon cancer   . Thyroid cancer   . Stroke   . Stroke   . Pneumonia June 2013  . AAA (abdominal aortic aneurysm)   . Type 2 diabetes mellitus   . CKD (chronic kidney disease), stage III     family reports caused by scans but that has resolved since CT scans have stopped  . Shortness of breath     chronic per patient   Past Surgical History  Procedure Laterality Date  . Colon resection  2007  . Thyroidectomy  2008  . Septoplasty    . Colon surgery  2007    Resection  . Abdominal aortic aneurysm repair N/A 11/08/2014    Procedure: ANEURYSM ABDOMINAL AORTIC REPAIR;  Surgeon: Rosetta Posner, MD;  Location: [facility] OR;  Service:  Vascular;  Laterality: N/A;   Family History  Problem Relation Age of Onset  . Stomach cancer Mother   . Heart failure Mother   . Breast cancer Mother   . Pneumonia Father   . Diabetes Paternal Uncle   . Diabetes Brother   . Heart disease Brother     Heart Disease before age 62,  AAA and  Carotid  . Heart attack Brother    History  Substance Use Topics  . Smoking status: Former Smoker -- 1.50 packs/day for 50 years    Types: Cigarettes    Quit date: 05/28/2005  . Smokeless tobacco: Never Used  . Alcohol Use: No    Review of Systems  All other systems reviewed and are negative.     Allergies  Doxycycline  Home Medications   Prior to Admission medications   Medication Sig Start Date End Date Taking? Authorizing Provider  acetaminophen (TYLENOL) 500 MG tablet Take 500 mg by mouth at bedtime. For pain    Historical Provider, MD  alendronate (FOSAMAX) 70 MG tablet Take 70 mg by mouth once a week. 05/18/15   Historical Provider, MD  B-D ULTRAFINE III SHORT PEN 31G X 8 MM MISC See admin instructions. 05/29/15   Historical Provider, MD  clopidogrel (PLAVIX) 75 MG tablet Take 75 mg by mouth  daily.    Historical Provider, MD  fluocinonide (LIDEX) 0.05 % external solution Apply 1 application topically 2 (two) times daily as needed. For irritation    Historical Provider, MD  Fluticasone-Salmeterol (ADVAIR) 250-50 MCG/DOSE AEPB Inhale 1 puff into the lungs every 12 (twelve) hours.    Historical Provider, MD  HUMALOG KWIKPEN 100 UNIT/ML KiwkPen INJECT 8 UNITS TWICE DAILY WITH MEALS AS DIRECTED 04/05/15   Historical Provider, MD  ibandronate (BONIVA) 150 MG tablet Take 150 mg by mouth every 30 (thirty) days.  12/08/14   Historical Provider, MD  ipratropium (ATROVENT) 0.02 % nebulizer solution Take 500 mcg by nebulization 2 (two) times daily.    Historical Provider, MD  Ipratropium-Albuterol (COMBIVENT IN) Inhale 2 puffs into the lungs 3 (three) times daily.     Historical Provider, MD   ipratropium-albuterol (DUONEB) 0.5-2.5 (3) MG/3ML SOLN Take 3 mLs by nebulization every 12 (twelve) hours as needed.    Historical Provider, MD  LEVEMIR FLEXTOUCH 100 UNIT/ML Pen  04/05/15   Historical Provider, MD  levothyroxine (SYNTHROID, LEVOTHROID) 125 MCG tablet Take 125 mcg by mouth daily.    Historical Provider, MD  losartan-hydrochlorothiazide (HYZAAR) 100-25 MG per tablet Take 0.5 tablets by mouth daily.     Historical Provider, MD  mirtazapine (REMERON) 15 MG tablet Take 15 mg by mouth at bedtime.    Historical Provider, MD  niacin (NIASPAN) 500 MG CR tablet Take 500 mg by mouth at bedtime.    Historical Provider, MD  omeprazole (PRILOSEC) 20 MG capsule Take 20 mg by mouth daily.    Historical Provider, MD  ONE TOUCH ULTRA TEST test strip  04/14/15   Historical Provider, MD  Jonetta Speak LANCETS 56O [place]  04/14/15   Historical Provider, MD  oxyCODONE (OXY IR/ROXICODONE) 5 MG immediate release tablet Take 1 tablet (5 mg total) by mouth every 6 (six) hours as needed for moderate pain. 11/12/14   Samantha J Rhyne, PA-C  rosuvastatin (CRESTOR) 20 MG tablet Take 20 mg by mouth daily.    Historical Provider, MD  sitaGLIPtin (JANUVIA) 100 MG tablet Take 50 mg by mouth daily.     Historical Provider, MD  triamcinolone cream (KENALOG) 0.1 % Apply 1 application topically 2 (two) times daily.    Historical Provider, MD  Vitamin D, Ergocalciferol, (DRISDOL) 50000 UNITS CAPS Take 50,000 Units by mouth every 7 (seven) days.    Historical Provider, MD   BP 122/52 mmHg  Pulse 89  Temp(Src) 99 F (37.2 C) (Oral)  Resp 20  SpO2 89% Physical Exam  Constitutional: He appears well-developed and well-nourished. No distress.  HENT:  Head: Normocephalic and atraumatic.  Neck: Neck supple.  Cardiovascular: Normal rate and regular rhythm.   Pulmonary/Chest: Effort normal and breath sounds normal. No respiratory distress.  Mild expiratory wheeze on left, rales right base  Abdominal: Soft. He exhibits  no distension and no mass. There is no tenderness. There is no rebound and no guarding.   Midline vertical surgical incision is well healed.    Neurological: He is alert. He exhibits normal muscle tone.  Skin: He is not diaphoretic.  Nursing note and vitals reviewed.   ED Course  Procedures (including critical care time) Labs Review Labs Reviewed  CBC - Abnormal; Notable for the following:    WBC 15.6 (*)    Platelets 140 (*)    All other components within normal limits  BASIC METABOLIC PANEL - Abnormal; Notable for the following:    Glucose, Bld 173 (*)  BUN 37 (*)    Creatinine, Ser 1.90 (*)    GFR calc non Af Amer 31 (*)    GFR calc Af Amer 36 (*)    All other components within normal limits  HEPATIC FUNCTION PANEL - Abnormal; Notable for the following:    Total Bilirubin 1.3 (*)    Indirect Bilirubin 1.0 (*)    All other components within normal limits  LIPASE, BLOOD - Abnormal; Notable for the following:    Lipase 12 (*)    All other components within normal limits  CULTURE, BLOOD (ROUTINE X 2)  CULTURE, BLOOD (ROUTINE X 2)  CULTURE, EXPECTORATED SPUTUM-ASSESSMENT  GRAM STAIN  URINE CULTURE  URINALYSIS, ROUTINE W REFLEX MICROSCOPIC (NOT AT [facility])  STREP PNEUMONIAE URINARY ANTIGEN  TSH  LEGIONELLA ANTIGEN, URINE  HIV ANTIBODY (ROUTINE TESTING)  HEMOGLOBIN A1C  I-STAT TROPOININ, ED  I-STAT CG4 LACTIC ACID, ED  I-STAT CG4 LACTIC ACID, ED    Imaging Review Dg Chest 2 View (if Patient Has Fever And/or Copd)  06/23/2015   CLINICAL DATA:  Fever, cough, and weakness.  EXAM: CHEST  2 VIEW  COMPARISON:  11/09/2014.  FINDINGS: Normal heart size. Calcified tortuous aorta without aneurysmal dilatation.  Acute LEFT lower lobe infiltrate was not present previously. Trace LEFT pleural effusion. Minor subsegmental atelectasis. RIGHT lung clear. No osseous findings.  IMPRESSION: Acute LEFT lower lobe pneumonia.   Electronically Signed   By: Staci Righter M.D.   On: 06/23/2015 07:36      EKG Interpretation   Date/Time:  Thursday June 23 2015 06:43:16 EDT Ventricular Rate:  85 PR Interval:  176 QRS Duration: 96 QT Interval:  357 QTC Calculation: 424 R Axis:   54 Text Interpretation:  Sinus rhythm Borderline low voltage, extremity leads  No significant change since last tracing Confirmed by Wilson Singer  MD, STEPHEN  ([id number]) on 06/23/2015 6:50:06 AM Also confirmed by Wilson Singer  MD, [place]  ([id number]), editor Gilford Rile, CCT, [place] ([id number])  on 06/23/2015 7:03:40 AM      MDM   Final diagnoses:  CAP (community acquired pneumonia)    Febrile, slightly hypoxic patient with hx COPD, DM p/w weakness, chills, N/V/D and increased cough since last night.  Found to have pneumonia.  Treated as CAP.  Admitted to [facility], Dr Charlies Silvers accepting.      [name] Bibles, PA-C 06/23/15 [place], MD 06/24/15 [phone]

## 2015-06-23 NOTE — ED Notes (Signed)
Pt reports chills, nausea with heaving and diarrhea x 2 since 4am. PWD, NAD, A & O. Pt denies pain, pt states he has had a cough recently, non productive.

## 2015-06-23 NOTE — Progress Notes (Signed)
Received report from [employer]. Pt arrived unit accompanied by spouse, alert and oriented, able to communicate needs. MD notified of Pt's location, will continue with current plan of care.

## 2015-06-23 NOTE — ED Notes (Signed)
Blood cultures drawn prior to start of antibiotics.

## 2015-06-23 NOTE — H&P (Signed)
[facility] History and Physical  David [name] XYI:[id number] DOB: Dec 06, 1931 DOA: 06/23/2015  Referring physician: ER physician: Dr. Dorie Rank PCP: Jerlyn Ly, MD  Chief Complaint: shortness of breath   HPI:  81 year old male with past medical history of DM, hypertension, CVA, dyslipidemia who presented to [facility] ED with reports of nausea, vomiting, diarrhea, shortness of breath and non productive cough over past 2-3 days prior to this admission. Pt reported his symptoms started suddenly. Other than his home medications, he did not try anything else to relieve symptoms. He took nebulizer treatments per home regimen bt they did not provide significant symptomatic relief. No reports of blood in stool or emesis. He did have associated fevers and chills, poor po intake and weakness for same amount of time. He reports no chest pain, no palpitations. No falls. No loss of consciousness. No GU issues.   In ED, BP was 117/49, HR 89, RR 20-24, T max 101.9 F and oxygen saturation 89% on room air. This has improved with [place] oxygen support to 95%. Blood work was significant for leukocytosis of 15.6, platelets of 140, creatinine of 1.9 (recent baseline 1.5). Chest x ray showed acute left lower lobe pneumonia. Pt was started on empiric azithromycin and rocephin in ED and admitted for further treatment of pneumonia.   Assessment & Plan    Principal Problem:   Sepsis secondary to lobar pneumonia, unspecified organism / Acute respiratory failure with hypoxia / Leukocytosis - Sepsis criteria met on admission with fever, hypoxia, tachypnea, leukocytosis. Lactic acid was WNL. Source of infection - left lower lobe pneumonia which is seen on admission CXR. - Sepsis work up initiated. Started empiric azithromycin and rocephin. - PSI/PORT score 101 so hospitalization recommended.  - Pneumonia order set placed. Follow up blood culture results. Strep pneumonia is negative. Legionella is pending. F/U  respiratory culture results. - Patient still has some shortness of breath so will use duoneb scheduled every 4 hours and on as needed basis for shortness of breath or wheezing  - Continue oxygen support via [place] to keep O2 saturation above 90%  Active Problems:   Nausea, vomiting, diarrhea - Possible viral gastroenteritis  - Will send the stool for C.diff and stool culture - Legionella urine Ag is pending - Provide supportive care with IV fluids and antiemetics as needed     Cerebral artery occlusion with cerebral infarction - History of CVA in 2006 - Continue plavix     HTN (hypertension), essential - Pt takes Hyzaar at home but we held this medication due to slightly worse renal failure compared with baseline values. Also, his BP was 117/49 on admission. - Will continue to monitor blood pressure and resume his medication if renal function improves and once BP at least 120/80    Dyslipidemia - Continue Crestor 10 mg at bedtime    Type 2 diabetes mellitus with diabetic chronic kidney disease, uncontrolled - Last A1c in 10/2014 was 7.2 so slightly above the glycemic goal for diabetic patient  - Check A1c on this admission - Resume Novolog at 6 units TID AC (patient's home regimen slightly different between 6-7 units TID). He is also on Levemir 22 units at bedtime     CKD (chronic kidney disease) stage 3, GFR 30-59 ml/min - Baseline Creatinine around 1.5 and on this admission 1.9. Possibly from Hyzaar which was temporarily placed on hold - Will give IV fluids at low rate and repeat BMP tomorrow am    Hypothyroidism - Resume  synthroid - Check TSH    Thrombocytopenia - Mild, will repeat admission labs - Use SCD's for DVT prophylaxis    DVT prophylaxis:  - SCD's bilaterally     Radiological Exams on Admission: Dg Chest 2 View (if Patient Has Fever And/or Copd) 06/23/2015   Acute LEFT lower lobe pneumonia.      EKG: I have personally reviewed EKG. EKG shows sinus  rhtyhm  Code Status: Full Family Communication: Plan of care discussed with the patient  Disposition Plan: Admit for further evaluation  Leisa Lenz, MD  [facility] Pager [phone]  Time spent in minutes: 75 minutes  Review of Systems:  Constitutional: Negative for fever, chills and malaise/fatigue. Negative for diaphoresis.  HENT: Negative for hearing loss, ear pain, nosebleeds, congestion, sore throat, neck pain, tinnitus and ear discharge.   Eyes: Negative for blurred vision, double vision, photophobia, pain, discharge and redness.  Respiratory: per HPI.   Cardiovascular: Negative for chest pain, palpitations, orthopnea, claudication and leg swelling.  Gastrointestinal: positive for nausea, vomiting and abdominal pain. Negative for heartburn, constipation, blood in stool and melena.  Genitourinary: Negative for dysuria, urgency, frequency, hematuria and flank pain.  Musculoskeletal: Negative for myalgias, back pain, joint pain and falls.  Skin: Negative for itching and rash.  Neurological: Negative for dizziness and weakness. Negative for tingling, tremors, sensory change, speech change, focal weakness, loss of consciousness and headaches.  Endo/Heme/Allergies: Negative for environmental allergies and polydipsia. Does not bruise/bleed easily.  Psychiatric/Behavioral: Negative for suicidal ideas. The patient is not nervous/anxious.      Past Medical History  Diagnosis Date  . COPD (chronic obstructive pulmonary disease)   . Hypertension   . Gastroesophageal reflux disease   . Hyperlipidemia   . AAA (abdominal aortic aneurysm)   . Thyroid disease   . Lung nodules   . Diabetes mellitus   . ED (erectile dysfunction)   . BPH (benign prostatic hyperplasia)   . Positional vertigo     intermittent   . Osteopenia   . AAA (abdominal aortic aneurysm)   . Vitamin D deficiency   . Atherosclerosis     pt denies this  . Peripheral vascular disease   . Colon cancer   .  Thyroid cancer   . Stroke   . Stroke   . Pneumonia June 2013  . AAA (abdominal aortic aneurysm)   . Type 2 diabetes mellitus   . CKD (chronic kidney disease), stage III     family reports caused by scans but that has resolved since CT scans have stopped  . Shortness of breath     chronic per patient   Past Surgical History  Procedure Laterality Date  . Colon resection  2007  . Thyroidectomy  2008  . Septoplasty    . Colon surgery  2007    Resection  . Abdominal aortic aneurysm repair N/A 11/08/2014    Procedure: ANEURYSM ABDOMINAL AORTIC REPAIR;  Surgeon: Rosetta Posner, MD;  Location: [facility] OR;  Service: Vascular;  Laterality: N/A;   Social History:  reports that he quit smoking about 10 years ago. His smoking use included Cigarettes. He has a 75 pack-year smoking history. He has never used smokeless tobacco. He reports that he does not drink alcohol or use illicit drugs.  Allergies  Allergen Reactions  . Doxycycline Rash    Family History:  Family History  Problem Relation Age of Onset  . Stomach cancer Mother   . Heart failure Mother   . Breast cancer  Mother   . Pneumonia Father   . Diabetes Paternal Uncle   . Diabetes Brother   . Heart disease Brother     Heart Disease before age 72,  AAA and  Carotid  . Heart attack Brother      Prior to Admission medications   Medication Sig Start Date End Date Taking? Authorizing Provider  acetaminophen (TYLENOL) 500 MG tablet Take 500-1,000 mg by mouth at bedtime. For pain   Yes Historical Provider, MD  alendronate (FOSAMAX) 70 MG tablet Take 70 mg by mouth once a week. Friday 05/18/15  Yes Historical Provider, MD  clopidogrel (PLAVIX) 75 MG tablet Take 75 mg by mouth daily.   Yes Historical Provider, MD  fluocinonide (LIDEX) 0.05 % external solution Apply 1 application topically 2 (two) times daily as needed. For irritation   Yes Historical Provider, MD  Fluticasone-Salmeterol (ADVAIR) 250-50 MCG/DOSE AEPB Inhale 1 puff into the  lungs every 12 (twelve) hours.   Yes Historical Provider, MD  HUMALOG KWIKPEN 100 UNIT/ML KiwkPen INJECT 6 UNITS BEFORE BREAKFAST+ LUNCH AND 7 UNITS BEFORE SUPPER. 04/05/15  Yes Historical Provider, MD  ipratropium (ATROVENT) 0.02 % nebulizer solution Take 500 mcg by nebulization 2 (two) times daily.   Yes Historical Provider, MD  Ipratropium-Albuterol (COMBIVENT IN) Inhale 2 puffs into the lungs 3 (three) times daily.    Yes Historical Provider, MD  ipratropium-albuterol (DUONEB) 0.5-2.5 (3) MG/3ML SOLN Take 3 mLs by nebulization every 12 (twelve) hours as needed (shortness of breath).    Yes Historical Provider, MD  LEVEMIR FLEXTOUCH 100 UNIT/ML Pen Inject 22 Units into the skin at bedtime.  04/05/15  Yes Historical Provider, MD  levothyroxine (SYNTHROID, LEVOTHROID) 125 MCG tablet Take 125 mcg by mouth daily. For six days a week.   Yes Historical Provider, MD  losartan-hydrochlorothiazide (HYZAAR) 100-25 MG per tablet Take 0.5 tablets by mouth daily.    Yes Historical Provider, MD  mirtazapine (REMERON) 15 MG tablet Take 15 mg by mouth at bedtime.   Yes Historical Provider, MD  omeprazole (PRILOSEC) 20 MG capsule Take 20 mg by mouth daily.   Yes Historical Provider, MD  rosuvastatin (CRESTOR) 20 MG tablet Take 10 mg by mouth daily.    Yes Historical Provider, MD  triamcinolone cream (KENALOG) 0.1 % Apply 1 application topically 2 (two) times daily as needed (rash).    Yes Historical Provider, MD  Vitamin D, Ergocalciferol, (DRISDOL) 50000 UNITS CAPS Take 50,000 Units by mouth every 7 (seven) days.   Yes Historical Provider, MD  oxyCODONE (OXY IR/ROXICODONE) 5 MG immediate release tablet Take 1 tablet (5 mg total) by mouth every 6 (six) hours as needed for moderate pain. Patient not taking: Reported on 06/23/2015 11/12/14   Gabriel Earing, PA-C   Physical Exam: Danley Danker Vitals:   06/23/15 5409 06/23/15 0718 06/23/15 0811 06/23/15 0832  BP: 122/52   117/49  Pulse: 89   80  Temp: 99 F (37.2 C)  101.9 F (38.8 C)  98.5 F (36.9 C)  TempSrc: Oral Rectal  Oral  Resp: 20   24  SpO2: 89%  94% 92%    Physical Exam  Constitutional: Appears well-developed and well-nourished. No distress.  HENT: Normocephalic. No tonsillar erythema or exudates Eyes: Conjunctivae are normal. No scleral icterus.  Neck: Normal ROM. Neck supple. No JVD. No tracheal deviation. No thyromegaly.  CVS: RRR, S1/S2 appreciated.  Pulmonary: wheezing in bilateral mid and upper lung lobes, rales at bases Abdominal: Soft. BS +,  no distension, tenderness,  rebound or guarding.  Musculoskeletal: Normal range of motion. No edema and no tenderness.  Lymphadenopathy: No lymphadenopathy noted, cervical, inguinal. Neuro: Alert. Normal reflexes, muscle tone coordination. No focal neurologic deficits. Skin: Skin is warm and dry. No rash noted.  No erythema. No pallor.  Psychiatric: Normal mood and affect. Behavior, judgment, thought content normal.   Labs on Admission:  Basic Metabolic Panel:  Recent Labs Lab 06/23/15 0713  NA 138  K 4.2  CL 105  CO2 23  GLUCOSE 173*  BUN 37*  CREATININE 1.90*  CALCIUM 9.3   Liver Function Tests:  Recent Labs Lab 06/23/15 0713  AST 20  ALT 21  ALKPHOS 54  BILITOT 1.3*  PROT 7.1  ALBUMIN 4.4    Recent Labs Lab 06/23/15 0713  LIPASE 12*   No results for input(s): AMMONIA in the last 168 hours. CBC:  Recent Labs Lab 06/23/15 0713  WBC 15.6*  HGB 14.6  HCT 43.6  MCV 96.2  PLT 140*   Cardiac Enzymes: No results for input(s): CKTOTAL, CKMB, CKMBINDEX, TROPONINI in the last 168 hours. BNP: Invalid input(s): POCBNP CBG: No results for input(s): GLUCAP in the last 168 hours.  If 7PM-7AM, please contact night-coverage [email or web address] Password [facility] 06/23/2015, 8:44 AM

## 2015-06-24 DIAGNOSIS — I5032 Chronic diastolic (congestive) heart failure: Secondary | ICD-10-CM

## 2015-06-24 DIAGNOSIS — N179 Acute kidney failure, unspecified: Secondary | ICD-10-CM

## 2015-06-24 DIAGNOSIS — J189 Pneumonia, unspecified organism: Secondary | ICD-10-CM

## 2015-06-24 DIAGNOSIS — J9601 Acute respiratory failure with hypoxia: Secondary | ICD-10-CM

## 2015-06-24 LAB — COMPREHENSIVE METABOLIC PANEL
ALK PHOS: 42 U/L (ref 38–126)
ALT: 15 U/L — AB (ref 17–63)
AST: 15 U/L (ref 15–41)
Albumin: 3.7 g/dL (ref 3.5–5.0)
Anion gap: 12 (ref 5–15)
BILIRUBIN TOTAL: 1.3 mg/dL — AB (ref 0.3–1.2)
BUN: 45 mg/dL — ABNORMAL HIGH (ref 6–20)
CALCIUM: 8.8 mg/dL — AB (ref 8.9–10.3)
CO2: 26 mmol/L (ref 22–32)
CREATININE: 2.51 mg/dL — AB (ref 0.61–1.24)
Chloride: 104 mmol/L (ref 101–111)
GFR calc Af Amer: 26 mL/min — ABNORMAL LOW (ref >60)
GFR, EST NON AFRICAN AMERICAN: 22 mL/min — AB (ref >60)
GLUCOSE: 141 mg/dL — AB (ref 65–99)
Potassium: 4.3 mmol/L (ref 3.5–5.1)
Sodium: 142 mmol/L (ref 135–145)
TOTAL PROTEIN: 6.3 g/dL — AB (ref 6.5–8.1)

## 2015-06-24 LAB — LEGIONELLA ANTIGEN, URINE

## 2015-06-24 LAB — CBC
HEMATOCRIT: 38.2 % — AB (ref 39.0–52.0)
Hemoglobin: 12.7 g/dL — ABNORMAL LOW (ref 13.0–17.0)
MCH: 32 pg (ref 26.0–34.0)
MCHC: 33.2 g/dL (ref 30.0–36.0)
MCV: 96.2 fL (ref 78.0–100.0)
Platelets: 122 10*3/uL — ABNORMAL LOW (ref 150–400)
RBC: 3.97 MIL/uL — AB (ref 4.22–5.81)
RDW: 14 % (ref 11.5–15.5)
WBC: 11.7 10*3/uL — AB (ref 4.0–10.5)

## 2015-06-24 LAB — GLUCOSE, CAPILLARY
GLUCOSE-CAPILLARY: 153 mg/dL — AB (ref 65–99)
Glucose-Capillary: 144 mg/dL — ABNORMAL HIGH (ref 65–99)
Glucose-Capillary: 111 mg/dL — ABNORMAL HIGH (ref 65–99)
Glucose-Capillary: 153 mg/dL — ABNORMAL HIGH (ref 65–99)

## 2015-06-24 LAB — URINE CULTURE

## 2015-06-24 LAB — HIV ANTIBODY (ROUTINE TESTING W REFLEX): HIV SCREEN 4TH GENERATION: NONREACTIVE

## 2015-06-24 LAB — HEMOGLOBIN A1C
Hgb A1c MFr Bld: 6.4 % — ABNORMAL HIGH (ref 4.8–5.6)
Mean Plasma Glucose: 137 mg/dL

## 2015-06-24 MED ORDER — SODIUM CHLORIDE 0.9 % IV SOLN
INTRAVENOUS | Status: DC
  Administered 2015-06-24: 10:00:00 via INTRAVENOUS

## 2015-06-24 MED ORDER — IPRATROPIUM-ALBUTEROL 0.5-2.5 (3) MG/3ML IN SOLN
3.0000 mL | Freq: Three times a day (TID) | RESPIRATORY_TRACT | Status: DC
  Administered 2015-06-25: 3 mL via RESPIRATORY_TRACT
  Filled 2015-06-24: qty 3

## 2015-06-24 NOTE — Progress Notes (Signed)
On ambulation pulse ox 88-90% on room air. Will continue to monitor.

## 2015-06-24 NOTE — Progress Notes (Signed)
Utilization review completed.  

## 2015-06-24 NOTE — Progress Notes (Signed)
PROGRESS NOTE  BIRCH FARINO MVE:[id number] DOB: June 28, 1931 DOA: 06/23/2015 PCP: Jerlyn Ly, MD  HPI/Recap of past 13 hours: 81 year old white male past oral history of stage III chronic kidney disease, diabetes mellitus and chronic diastolic heart failure admitted on 6/30 with community-acquired pneumonia and found to be hypoxic requiring oxygen.  By hospital day 2, patient improving. White blood cell count normalized. Still feels very weak. Renal function noted to be elevated and he was started on gentle IV fluids. Patient states his breathing is doing much better.  Assessment/Plan: Principal Problem:   Acute respiratory failure with hypoxia secondary to community-acquired pneumonia. Doing better. Continue antibiotics. Ambulated on room air and oxygen saturations of 89-90 percent.    Type 2 diabetes mellitus with diabetic chronic kidney disease: CBG stable   Acute kidney injury in the setting of CKD (chronic kidney disease) stage 3, GFR 30-59 ml/min: Has started gentle IV fluids.   Hypothyroidism: Continue Synthroid   Leukocytosis: Resolved   Thrombocytopenia   Chronic diastolic heart failure: Currently slightly dehydrated. Chest clear IV fluids for renal failure.    Code Status: Full code  Family Communication: Wife at the bedside  Disposition Plan: Likely discharge tomorrow   Consultants:  None  Procedures:  None  Antibiotics:  Rocephin and Zithromax IV 6/30-present   Objective: BP 108/54 mmHg  Pulse 74  Temp(Src) 98 F (36.7 C) (Oral)  Resp 20  Ht 6' (1.829 m)  Wt 84.8 kg (186 lb 15.2 oz)  BMI 25.35 kg/m2  SpO2 94%  Intake/Output Summary (Last 24 hours) at 06/24/15 1525 Last data filed at 06/24/15 1400  Gross per 24 hour  Intake    240 ml  Output    800 ml  Net   -560 ml   Filed Weights   06/24/15 0510  Weight: 84.8 kg (186 lb 15.2 oz)    Exam:   General:  Alert and oriented 3, no acute distress  Cardiovascular: Regular rate and  rhythm, S1 and S2  Respiratory: Clear to auscultation bilaterally  Abdomen: Soft, nontender, nondistended, positive bowel sounds  Musculoskeletal: No clubbing or cyanosis or edema   Data Reviewed: Basic Metabolic Panel:  Recent Labs Lab 06/23/15 0713 06/24/15 0507  NA 138 142  K 4.2 4.3  CL 105 104  CO2 23 26  GLUCOSE 173* 141*  BUN 37* 45*  CREATININE 1.90* 2.51*  CALCIUM 9.3 8.8*   Liver Function Tests:  Recent Labs Lab 06/23/15 0713 06/24/15 0507  AST 20 15  ALT 21 15*  ALKPHOS 54 42  BILITOT 1.3* 1.3*  PROT 7.1 6.3*  ALBUMIN 4.4 3.7    Recent Labs Lab 06/23/15 0713  LIPASE 12*   No results for input(s): AMMONIA in the last 168 hours. CBC:  Recent Labs Lab 06/23/15 0713 06/24/15 0507  WBC 15.6* 11.7*  HGB 14.6 12.7*  HCT 43.6 38.2*  MCV 96.2 96.2  PLT 140* 122*   Cardiac Enzymes:   No results for input(s): CKTOTAL, CKMB, CKMBINDEX, TROPONINI in the last 168 hours. BNP (last 3 results) No results for input(s): BNP in the last 8760 hours.  ProBNP (last 3 results) No results for input(s): PROBNP in the last 8760 hours.  CBG:  Recent Labs Lab 06/23/15 1818 06/23/15 2208 06/24/15 0745 06/24/15 1215  GLUCAP 174* 146* 144* 111*    Recent Results (from the past 240 hour(s))  Blood culture (routine x 2)     Status: None (Preliminary result)   Collection Time: 06/23/15  7:13  AM  Result Value Ref Range Status   Specimen Description BLOOD LEFT HAND  Final   Special Requests BOTTLES DRAWN AEROBIC AND ANAEROBIC 5CC  Final   Culture   Final    NO GROWTH 1 DAY Performed at [facility]    Report Status PENDING  Incomplete  Blood culture (routine x 2)     Status: None (Preliminary result)   Collection Time: 06/23/15  8:27 AM  Result Value Ref Range Status   Specimen Description BLOOD RIGHT ANTECUBITAL  Final   Special Requests BOTTLES DRAWN AEROBIC AND ANAEROBIC 5ML  Final   Culture   Final    NO GROWTH 1 DAY Performed at [facility]    Report Status PENDING  Incomplete  Urine culture     Status: None   Collection Time: 06/23/15 11:10 AM  Result Value Ref Range Status   Specimen Description URINE, CLEAN CATCH  Final   Special Requests NONE  Final   Culture   Final    MULTIPLE SPECIES PRESENT, SUGGEST RECOLLECTION IF CLINICALLY INDICATED Performed at [facility]    Report Status 06/24/2015 FINAL  Final     Studies: No results found.  Scheduled Meds: . acetaminophen  500-1,000 mg Oral QHS  . azithromycin  500 mg Intravenous Q24H  . cefTRIAXone (ROCEPHIN)  IV  1 g Intravenous Q24H  . clopidogrel  75 mg Oral Daily  . insulin aspart  0-15 Units Subcutaneous TID WC  . insulin aspart  6 Units Subcutaneous TID WC  . insulin detemir  22 Units Subcutaneous QHS  . ipratropium-albuterol  3 mL Nebulization Q4H WA  . levothyroxine  125 mcg Oral Once per day on Mon Tue Wed Thu Fri Sat  . mirtazapine  15 mg Oral QHS  . pantoprazole  40 mg Oral Daily  . rosuvastatin  10 mg Oral q1800  . saccharomyces boulardii  250 mg Oral BID    Continuous Infusions: . sodium chloride 75 mL/hr at 06/24/15 1003     Time spent: 25 minutes  [facility] Pager [phone]. If 7PM-7AM, please contact night-coverage at [email or web address], password [facility] 06/24/2015, 3:25 PM  LOS: 1 day

## 2015-06-24 NOTE — Evaluation (Signed)
Physical Therapy Evaluation Patient Details Name: David [name] MRN: [id number] DOB: [birth year] Today's Date: 06/24/2015   History of Present Illness  81 year old male with past medical history of DM, hypertension, CVA, dyslipidemia who presented to [facility] ED with reports of nausea, vomiting, diarrhea, shortness of breath and non productive cough and admitted for Sepsis secondary to lobar pneumonia, unspecified organism / Acute respiratory failure with hypoxia / Leukocytosis  Clinical Impression  Pt admitted with above diagnosis. Pt currently with functional limitations due to the deficits listed below (see PT Problem List).  Pt will benefit from skilled PT to increase their independence and safety with mobility to allow discharge to the venue listed below.  Pt and spouse reports pt has had HHPT in the past and they do not feel they need this service again at this time.  Pt and spouse hopeful to decrease need for oxygen upon d/c.  Pt did very well on 6L O2 [place] during gait with SpO2 at 97% however mildly unsteady.  Pt agreeable to use SPC or RW upon d/c until feeling better and more steady.      Follow Up Recommendations No PT follow up    Equipment Recommendations  None recommended by PT    Recommendations for Other Services       Precautions / Restrictions Precautions Precautions: Fall Precaution Comments: monitor sats      Mobility  Bed Mobility Overal bed mobility: Modified Independent                Transfers Overall transfer level: Needs assistance Equipment used: None Transfers: Sit to/from Stand Sit to Stand: Min guard         General transfer comment: min/guard for safety. mildly unsteady upon rise but able to self correct  Ambulation/Gait Ambulation/Gait assistance: Min guard Ambulation Distance (Feet): 240 Feet Assistive device: Rolling walker (2 wheeled) Gait Pattern/deviations: Step-through pattern;Decreased stride length     General Gait Details: pt  pushed IV pole for more support, mildly unsteady however no physical assist required, remained on 6L O2 [place] with SpO2 97% throughout gait, pt reports fatigue in LEs upon return to room  Stairs            Wheelchair Mobility    Modified Rankin (Stroke Patients Only)       Balance Overall balance assessment:  (denies hx of falls)                                           Pertinent Vitals/Pain Pain Assessment: No/denies pain    Home Living Family/patient expects to be discharged to:: Private residence Living Arrangements: Spouse/significant other Available Help at Discharge: Family Type of Home: House Home Access: Stairs to enter     Home Layout: One level Home Equipment: Environmental consultant - 2 wheels;Cane - single point      Prior Function Level of Independence: Independent               Hand Dominance        Extremity/Trunk Assessment               Lower Extremity Assessment: Overall WFL for tasks assessed         Communication   Communication: No difficulties  Cognition Arousal/Alertness: Awake/alert Behavior During Therapy: WFL for tasks assessed/performed Overall Cognitive Status: Within Functional Limits for tasks assessed  General Comments      Exercises        Assessment/Plan    PT Assessment Patient needs continued PT services  PT Diagnosis Difficulty walking   PT Problem List Decreased activity tolerance;Decreased mobility;Cardiopulmonary status limiting activity  PT Treatment Interventions DME instruction;Gait training;Functional mobility training;Therapeutic activities;Therapeutic exercise;Patient/family education   PT Goals (Current goals can be found in the Care Plan section) Acute Rehab PT Goals PT Goal Formulation: With patient Time For Goal Achievement: 07/01/15 Potential to Achieve Goals: Good    Frequency Min 3X/week   Barriers to discharge        Co-evaluation                End of Session Equipment Utilized During Treatment: Gait belt;Oxygen Activity Tolerance: Patient tolerated treatment well Patient left: in chair;with call bell/phone within reach;with family/visitor present           Time: 0802-2336 PT Time Calculation (min) (ACUTE ONLY): 21 min   Charges:   PT Evaluation $Initial PT Evaluation Tier I: 1 Procedure     PT G Codes:        Kathrin Folden,KATHrine E 06/24/2015, 12:08 PM Carmelia Bake, PT, DPT 06/24/2015 Pager: [phone]

## 2015-06-25 DIAGNOSIS — N179 Acute kidney failure, unspecified: Secondary | ICD-10-CM | POA: Clinically undetermined

## 2015-06-25 DIAGNOSIS — N183 Chronic kidney disease, stage 3 (moderate): Secondary | ICD-10-CM

## 2015-06-25 LAB — BASIC METABOLIC PANEL
ANION GAP: 9 (ref 5–15)
BUN: 39 mg/dL — AB (ref 6–20)
CHLORIDE: 106 mmol/L (ref 101–111)
CO2: 25 mmol/L (ref 22–32)
Calcium: 8.2 mg/dL — ABNORMAL LOW (ref 8.9–10.3)
Creatinine, Ser: 1.98 mg/dL — ABNORMAL HIGH (ref 0.61–1.24)
GFR calc Af Amer: 35 mL/min — ABNORMAL LOW (ref >60)
GFR calc non Af Amer: 30 mL/min — ABNORMAL LOW (ref >60)
Glucose, Bld: 101 mg/dL — ABNORMAL HIGH (ref 65–99)
POTASSIUM: 4 mmol/L (ref 3.5–5.1)
Sodium: 140 mmol/L (ref 135–145)

## 2015-06-25 LAB — GLUCOSE, CAPILLARY
GLUCOSE-CAPILLARY: 143 mg/dL — AB (ref 65–99)
Glucose-Capillary: 89 mg/dL (ref 65–99)

## 2015-06-25 MED ORDER — CEFUROXIME AXETIL 500 MG PO TABS: 500.0000 mg | ORAL_TABLET | Freq: Two times a day (BID) | ORAL | Status: AC

## 2015-06-25 MED ORDER — AZITHROMYCIN 500 MG PO TABS: 500.0000 mg | ORAL_TABLET | Freq: Every day | ORAL | Status: AC

## 2015-06-25 NOTE — Discharge Instructions (Signed)

## 2015-06-25 NOTE — Discharge Summary (Addendum)
Discharge Summary  David [name] HBZ:[id number] DOB: [birth year]  PCP: Jerlyn Ly, MD  Admit date: 06/23/2015 Discharge date: 06/25/2015  Time spent: 25 minutes  Recommendations for Outpatient Follow-up:  1. New medication: Ceftin 500 mg by mouth twice a day 3 days 2. New medication: Zithromax 500 mg by mouth daily 3 days 3. Patient will follow-up with his primary care physician in the next 1-2 weeks   Discharge Diagnoses:  Active Hospital Problems   Diagnosis Date Noted  . Acute respiratory failure with hypoxia   . AKI (acute kidney injury) 06/25/2015  . Chronic diastolic heart failure 10/09/5101  . CAP (community acquired pneumonia) 06/23/2015  . CKD (chronic kidney disease) stage 3, GFR 30-59 ml/min 06/23/2015  . Hypothyroidism 06/23/2015  . Leukocytosis 06/23/2015  . Thrombocytopenia 06/23/2015  . Type 2 diabetes mellitus with diabetic chronic kidney disease   . Dyslipidemia 06/14/2012  . HTN (hypertension) 06/14/2012  . Cerebral artery occlusion with cerebral infarction 01/04/2009    Resolved Hospital Problems   Diagnosis Date Noted Date Resolved  No resolved problems to display.    Discharge Condition: Improved, being discharged home  Diet recommendation: Carb modified, heart healthy  Filed Weights   06/24/15 0510  Weight: 84.8 kg (186 lb 15.2 oz)    History of present illness:  81 year old white male past oral history of stage III chronic kidney disease, diabetes mellitus and chronic diastolic heart failure admitted on 6/30 with community-acquired pneumonia and found to be hypoxic requiring oxygen.  Hospital Course:  Principal Problem:   Sepsis causing Acute respiratory failure with hypoxia secondary to community-acquired pneumonia: Sepsis criteria met on admission with fever, hypoxia, tachypnea, leukocytosis. Source felt to be from left lower lobe pneumonia which is seen on admission CXR.By hospital day 2, patient much improved. White blood cell count  normalized. Patient was ambulated on room air and oxygen saturations were around 80-90%, up to 93% by 7/2. Active Problems:   Cerebral artery occlusion with cerebral infarction: : Stable   HTN (hypertension): Stable. Resumed medications upon discharge   Dyslipidemia    Type 2 diabetes mellitus with diabetic chronic kidney disease   Acute kidney injury in the setting of CKD (chronic kidney disease) stage 3, GFR 30-59 ml/min: By hospital day 2, creatinine of 2.5 with a GFR of 22. Patient started on gentle IV fluids and by day of discharge back to baseline at 30 with creatinine of 1.9.   Hypothyroidism: Stable, continue on Synthroid   Leukocytosis: Secondary pneumonia. Resolved.    Chronic diastolic heart failure: Volumes watched closely. Euvolemic by discharge  Procedures:  None  Consultations:  None  Discharge Exam: BP 114/58 mmHg  Pulse 65  Temp(Src) 98 F (36.7 C) (Oral)  Resp 20  Ht 6' (1.829 m)  Wt 84.8 kg (186 lb 15.2 oz)  BMI 25.35 kg/m2  SpO2 93%  General: Alert and oriented 3, no acute distress Cardiovascular: Regular rate and rhythm, S1-S2 Respiratory: Clear to auscultation bilaterally, no crackles at bases  Discharge Instructions You were cared for by a hospitalist during your hospital stay. If you have any questions about your discharge medications or the care you received while you were in the hospital after you are discharged, you can call the unit and asked to speak with the hospitalist on call if the hospitalist that took care of you is not available. Once you are discharged, your primary care physician will handle any further medical issues. Please note that NO REFILLS for any discharge medications will be  authorized once you are discharged, as it is imperative that you return to your primary care physician (or establish a relationship with a primary care physician if you do not have one) for your aftercare needs so that they can reassess your need for medications  and monitor your lab values.  Discharge Instructions    Diet - low sodium heart healthy    Complete by:  As directed      Increase activity slowly    Complete by:  As directed             Medication List    STOP taking these medications        oxyCODONE 5 MG immediate release tablet  Commonly known as:  Oxy IR/ROXICODONE      TAKE these medications        acetaminophen 500 MG tablet  Commonly known as:  TYLENOL  Take 500-1,000 mg by mouth at bedtime. Take 500 mg daily at bedtime and may take an additional 557m tablet if needed for pain     alendronate 70 MG tablet  Commonly known as:  FOSAMAX  Take 70 mg by mouth once a week. Friday     azithromycin 500 MG tablet  Commonly known as:  ZITHROMAX  Take 1 tablet (500 mg total) by mouth daily.     cefUROXime 500 MG tablet  Commonly known as:  CEFTIN  Take 1 tablet (500 mg total) by mouth 2 (two) times daily with a meal.  Start taking on:  06/26/2015     clopidogrel 75 MG tablet  Commonly known as:  PLAVIX  Take 75 mg by mouth daily.     ipratropium-albuterol 0.5-2.5 (3) MG/3ML Soln  Commonly known as:  DUONEB  Take 3 mLs by nebulization every 12 (twelve) hours as needed (shortness of breath).     COMBIVENT IN  Inhale 2 puffs into the lungs 3 (three) times daily.     fluocinonide 0.05 % external solution  Commonly known as:  LIDEX  Apply 1 application topically 2 (two) times daily as needed. For irritation     Fluticasone-Salmeterol 250-50 MCG/DOSE Aepb  Commonly known as:  ADVAIR  Inhale 1 puff into the lungs every 12 (twelve) hours.     HUMALOG KWIKPEN 100 UNIT/ML KiwkPen  Generic drug:  insulin lispro  INJECT 6 UNITS BEFORE BREAKFAST+ LUNCH AND 7 UNITS BEFORE SUPPER.     ipratropium 0.02 % nebulizer solution  Commonly known as:  ATROVENT  Take 500 mcg by nebulization 2 (two) times daily.     LEVEMIR FLEXTOUCH 100 UNIT/ML Pen  Generic drug:  Insulin Detemir  Inject 22 Units into the skin at bedtime.      levothyroxine 125 MCG tablet  Commonly known as:  SYNTHROID, LEVOTHROID  Take 125 mcg by mouth See admin instructions. Take 1 tablet daily six days a week (Monday through Saturday, none on Sunday)     losartan-hydrochlorothiazide 100-25 MG per tablet  Commonly known as:  HYZAAR  Take 0.5 tablets by mouth daily.     mirtazapine 15 MG tablet  Commonly known as:  REMERON  Take 15 mg by mouth at bedtime.     omeprazole 20 MG capsule  Commonly known as:  PRILOSEC  Take 20 mg by mouth daily.     rosuvastatin 20 MG tablet  Commonly known as:  CRESTOR  Take 10 mg by mouth daily.     triamcinolone cream 0.1 %  Commonly known as:  KENALOG  Apply  1 application topically 2 (two) times daily as needed (rash).     Vitamin D (Ergocalciferol) 50000 UNITS Caps capsule  Commonly known as:  DRISDOL  Take 50,000 Units by mouth every 7 (seven) days.       Allergies  Allergen Reactions  . Doxycycline Rash       Follow-up Information    Schedule an appointment as soon as possible for a visit with Jerlyn Ly, MD.   Specialty:  Internal Medicine   Why:  Next week   Contact information:   [address] [place] [address] [phone]        The results of significant diagnostics from this hospitalization (including imaging, microbiology, ancillary and laboratory) are listed below for reference.    Significant Diagnostic Studies: Dg Chest 2 View (if Patient Has Fever And/or Copd)  06/23/2015   CLINICAL DATA:  Fever, cough, and weakness.  EXAM: CHEST  2 VIEW  COMPARISON:  11/09/2014.  FINDINGS: Normal heart size. Calcified tortuous aorta without aneurysmal dilatation.  Acute LEFT lower lobe infiltrate was not present previously. Trace LEFT pleural effusion. Minor subsegmental atelectasis. RIGHT lung clear. No osseous findings.  IMPRESSION: Acute LEFT lower lobe pneumonia.   Electronically Signed   By: Staci Righter M.D.   On: 06/23/2015 07:36    Microbiology: Recent Results (from  the past 240 hour(s))  Blood culture (routine x 2)     Status: None (Preliminary result)   Collection Time: 06/23/15  7:13 AM  Result Value Ref Range Status   Specimen Description BLOOD LEFT HAND  Final   Special Requests BOTTLES DRAWN AEROBIC AND ANAEROBIC 5CC  Final   Culture   Final    NO GROWTH 2 DAYS Performed at [facility]    Report Status PENDING  Incomplete  Blood culture (routine x 2)     Status: None (Preliminary result)   Collection Time: 06/23/15  8:27 AM  Result Value Ref Range Status   Specimen Description BLOOD RIGHT ANTECUBITAL  Final   Special Requests BOTTLES DRAWN AEROBIC AND ANAEROBIC 5ML  Final   Culture   Final    NO GROWTH 2 DAYS Performed at [facility]    Report Status PENDING  Incomplete  Urine culture     Status: None   Collection Time: 06/23/15 11:10 AM  Result Value Ref Range Status   Specimen Description URINE, CLEAN CATCH  Final   Special Requests NONE  Final   Culture   Final    MULTIPLE SPECIES PRESENT, SUGGEST RECOLLECTION IF CLINICALLY INDICATED Performed at [facility]    Report Status 06/24/2015 FINAL  Final     Labs: Basic Metabolic Panel:  Recent Labs Lab 06/23/15 0713 06/24/15 0507 06/25/15 0535  NA 138 142 140  K 4.2 4.3 4.0  CL 105 104 106  CO2 '23 26 25  ' GLUCOSE 173* 141* 101*  BUN 37* 45* 39*  CREATININE 1.90* 2.51* 1.98*  CALCIUM 9.3 8.8* 8.2*   Liver Function Tests:  Recent Labs Lab 06/23/15 0713 06/24/15 0507  AST 20 15  ALT 21 15*  ALKPHOS 54 42  BILITOT 1.3* 1.3*  PROT 7.1 6.3*  ALBUMIN 4.4 3.7    Recent Labs Lab 06/23/15 0713  LIPASE 12*   No results for input(s): AMMONIA in the last 168 hours. CBC:  Recent Labs Lab 06/23/15 0713 06/24/15 0507  WBC 15.6* 11.7*  HGB 14.6 12.7*  HCT 43.6 38.2*  MCV 96.2 96.2  PLT 140* 122*  Cardiac Enzymes: No results for input(s): CKTOTAL, CKMB, CKMBINDEX, TROPONINI in the last 168 hours. BNP: BNP (last 3 results) No results  for input(s): BNP in the last 8760 hours.  ProBNP (last 3 results) No results for input(s): PROBNP in the last 8760 hours.  CBG:  Recent Labs Lab 06/24/15 1215 06/24/15 1740 06/24/15 2202 06/25/15 0820 06/25/15 1128  GLUCAP 111* 153* 153* 89 143*       Signed:  Birdella Sippel K  [facility] 06/25/2015, 12:51 PM

## 2015-06-28 LAB — CULTURE, BLOOD (ROUTINE X 2)
CULTURE: NO GROWTH
Culture: NO GROWTH

## 2015-08-31 ENCOUNTER — Ambulatory Visit (INDEPENDENT_AMBULATORY_CARE_PROVIDER_SITE_OTHER): Payer: Medicare Other | Admitting: Podiatry

## 2015-08-31 DIAGNOSIS — M779 Enthesopathy, unspecified: Secondary | ICD-10-CM | POA: Diagnosis not present

## 2015-08-31 MED ORDER — TRIAMCINOLONE ACETONIDE 10 MG/ML IJ SUSP
10.0000 mg | Freq: Once | INTRAMUSCULAR | Status: AC
  Administered 2015-08-31: 10 mg

## 2015-09-01 NOTE — Progress Notes (Signed)
Subjective:     Patient ID: David [name], male   DOB: 02/08/1931, 81 y.o.   MRN: [id number]  HPI patient presents with pain on both feet stating that it's hard to walk or wear shoe gear comfortably.   Review of Systems     Objective:   Physical Exam Neurovascular status intact with inflammation around the lateral side fifth metatarsal right and dorsal right foot and left foot    Assessment:     Tendinitis-like symptomatology    Plan:     Careful injection administered 1.5 mg dexamethasone 1.5 mg Kenalog bilateral 3 mg Xylocaine into the inflamed tendon and patient will be seen back as necessary

## 2015-09-16 ENCOUNTER — Ambulatory Visit (INDEPENDENT_AMBULATORY_CARE_PROVIDER_SITE_OTHER): Payer: Medicare Other | Admitting: Podiatry

## 2015-09-16 DIAGNOSIS — E1049 Type 1 diabetes mellitus with other diabetic neurological complication: Secondary | ICD-10-CM

## 2015-09-16 DIAGNOSIS — M2142 Flat foot [pes planus] (acquired), left foot: Secondary | ICD-10-CM | POA: Diagnosis not present

## 2015-09-16 DIAGNOSIS — Q6650 Congenital pes planus, unspecified foot: Secondary | ICD-10-CM

## 2015-09-16 DIAGNOSIS — R0989 Other specified symptoms and signs involving the circulatory and respiratory systems: Secondary | ICD-10-CM | POA: Diagnosis not present

## 2015-09-16 DIAGNOSIS — M2141 Flat foot [pes planus] (acquired), right foot: Secondary | ICD-10-CM | POA: Diagnosis not present

## 2015-09-16 DIAGNOSIS — L84 Corns and callosities: Secondary | ICD-10-CM

## 2015-09-16 NOTE — Patient Instructions (Signed)

## 2015-09-16 NOTE — Progress Notes (Signed)
Patient ID: David [name], male   DOB: [birth year], 81 y.o.   MRN: [id number] Patient presents for diabetic shoe pick up, shoes are tried on for good fit.  Patient received 1 Pair Apex Y900M Ariya Black in men's 12 wide and 3 pairs custom molded diabetic inserts.  Verbal and written break in and wear instructions given.  Patient will follow up for scheduled routine care.

## 2015-09-22 ENCOUNTER — Encounter: Payer: Self-pay | Admitting: Podiatry

## 2015-09-22 ENCOUNTER — Ambulatory Visit (INDEPENDENT_AMBULATORY_CARE_PROVIDER_SITE_OTHER): Payer: Medicare Other | Admitting: Podiatry

## 2015-09-22 DIAGNOSIS — B351 Tinea unguium: Secondary | ICD-10-CM | POA: Diagnosis not present

## 2015-09-22 DIAGNOSIS — M79606 Pain in leg, unspecified: Secondary | ICD-10-CM | POA: Diagnosis not present

## 2015-09-22 NOTE — Progress Notes (Signed)
Patient ID: David [name], male   DOB: 05-Apr-1931, 81 y.o.   MRN: [id number] Complaint:  Visit Type: Patient returns to my office for continued preventative foot care services. Complaint: Patient states" my nails have grown long and thick and become painful to walk and wear shoes" Patient has been diagnosed with DM with no foot complications. The patient presents for preventative foot care services. No changes to ROS  Podiatric Exam: Vascular: dorsalis pedis and posterior tibial pulses are palpable bilateral. Capillary return is immediate. Temperature gradient is WNL. Skin turgor WNL  Sensorium: Absent  Semmes Weinstein monofilament test. Normal tactile sensation bilaterally. Nail Exam: Pt has thick disfigured discolored nails with subungual debris noted bilateral entire nail hallux through fifth toenails Ulcer Exam: There is no evidence of ulcer or pre-ulcerative changes or infection. Orthopedic Exam: Muscle tone and strength are WNL. No limitations in general ROM. No crepitus or effusions noted. Foot type and digits show no abnormalities. Bony prominences are unremarkable. Skin: No Porokeratosis. No infection or ulcers  Diagnosis:  Onychomycosis, , Pain in right toe, pain in left toes  Treatment & Plan Procedures and Treatment: Consent by patient was obtained for treatment procedures. The patient understood the discussion of treatment and procedures well. All questions were answered thoroughly reviewed. Debridement of mycotic and hypertrophic toenails, 1 through 5 bilateral and clearing of subungual debris. No ulceration, no infection noted.  Return Visit-Office Procedure: Patient instructed to return to the office for a follow up visit 3 months for continued evaluation and treatment.

## 2015-10-17 DIAGNOSIS — B029 Zoster without complications: Secondary | ICD-10-CM | POA: Insufficient documentation

## 2015-11-30 ENCOUNTER — Encounter: Payer: Self-pay | Admitting: Vascular Surgery

## 2015-12-06 ENCOUNTER — Ambulatory Visit (INDEPENDENT_AMBULATORY_CARE_PROVIDER_SITE_OTHER): Payer: Medicare Other | Admitting: Family

## 2015-12-06 ENCOUNTER — Ambulatory Visit (HOSPITAL_COMMUNITY)
Admission: RE | Admit: 2015-12-06 | Discharge: 2015-12-06 | Disposition: A | Discharge status: Home / Self Care | Payer: Medicare Other | Source: Ambulatory Visit | Attending: Family | Admitting: Family

## 2015-12-06 ENCOUNTER — Encounter: Payer: Self-pay | Admitting: Family

## 2015-12-06 VITALS — BP 145/73 | HR 61 | Temp 96.7°F | Resp 16 | Ht 72.0 in | Wt 196.0 lb

## 2015-12-06 DIAGNOSIS — Z9889 Other specified postprocedural states: Secondary | ICD-10-CM | POA: Diagnosis not present

## 2015-12-06 DIAGNOSIS — I722 Aneurysm of renal artery: Secondary | ICD-10-CM

## 2015-12-06 DIAGNOSIS — Z8679 Personal history of other diseases of the circulatory system: Secondary | ICD-10-CM | POA: Diagnosis not present

## 2015-12-06 DIAGNOSIS — I714 Abdominal aortic aneurysm, without rupture, unspecified: Secondary | ICD-10-CM

## 2015-12-06 DIAGNOSIS — Z48812 Encounter for surgical aftercare following surgery on the circulatory system: Secondary | ICD-10-CM

## 2015-12-06 DIAGNOSIS — E785 Hyperlipidemia, unspecified: Secondary | ICD-10-CM | POA: Diagnosis not present

## 2015-12-06 DIAGNOSIS — N183 Chronic kidney disease, stage 3 (moderate): Secondary | ICD-10-CM | POA: Diagnosis not present

## 2015-12-06 DIAGNOSIS — I129 Hypertensive chronic kidney disease with stage 1 through stage 4 chronic kidney disease, or unspecified chronic kidney disease: Secondary | ICD-10-CM | POA: Insufficient documentation

## 2015-12-06 DIAGNOSIS — E119 Type 2 diabetes mellitus without complications: Secondary | ICD-10-CM | POA: Insufficient documentation

## 2015-12-06 NOTE — Progress Notes (Signed)
Filed Vitals:   12/06/15 1156 12/06/15 1205  BP: 142/74 145/73  Pulse: 60 61  Temp: 96.7 F (35.9 C)   TempSrc: Oral   Resp: 16   Height: 6' (1.829 m)   Weight: 196 lb (88.905 kg)   SpO2: 97%

## 2015-12-06 NOTE — Progress Notes (Signed)
Established Open AAA Repair   History of Present Illness  David [name] is a 81 y.o. ([birth year]) male patient of Dr. Donnetta Hutching who returns today for follow-up of open juxtarenal aneurysm repair. He had a straight graft 24 mm in diameter. Surgery was on 11/08/2014.   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 He reports intermittent neuropathy sensation in feet: tingling, numbness, burning. He denies slow healing wounds.  States he can walk about 1/4 mile before legs feel tired, left leg moreso since stroke. He also has a history lumbar disc issues, but this has not bothered him lately.  He is taking Plavix and a statin daily.  He reports 2 strokes in 2006, wife states confirmed by MRI of his head. Carotid duplex in September 2015 showed <40% bilateral ICA stenoses.   Pt Diabetic: Yes, Pt states his last A1C was 6.5, in control.  Pt smoker: former smoker, quit in 2006 when he was diagnosed with colon and thyroid cancer   Past Medical History  Diagnosis Date  . COPD (chronic obstructive pulmonary disease) ([place])   . Hypertension   . Gastroesophageal reflux disease   . Hyperlipidemia   . AAA (abdominal aortic aneurysm) ([place])   . Thyroid disease   . Lung nodules   . Diabetes mellitus   . ED (erectile dysfunction)   . BPH (benign prostatic hyperplasia)   . Positional vertigo     intermittent   . Osteopenia   . AAA (abdominal aortic aneurysm) ([place])   . Vitamin D deficiency   . Atherosclerosis     pt denies this  . Peripheral vascular disease ([place])   . Colon cancer ([place])   . Thyroid cancer ([place])   . Stroke ([place])   . Stroke ([place])   . Pneumonia June 2013  . AAA (abdominal aortic aneurysm) ([place])   . Type 2 diabetes mellitus ([place])   . CKD (chronic kidney disease), stage III     family reports caused by scans but that has resolved since CT scans have stopped  . Shortness of breath     chronic per patient    Social History Social History  Substance Use Topics   . Smoking status: Former Smoker -- 1.50 packs/day for 50 years    Types: Cigarettes    Quit date: 05/28/2005  . Smokeless tobacco: Never Used  . Alcohol Use: No    Family History Family History  Problem Relation Age of Onset  . Stomach cancer Mother   . Heart failure Mother   . Breast cancer Mother   . Pneumonia Father   . Diabetes Paternal Uncle   . Diabetes Brother   . Heart disease Brother     Heart Disease before age 33,  AAA and  Carotid  . Heart attack Brother     Surgical History Past Surgical History  Procedure Laterality Date  . Colon resection  2007  . Thyroidectomy  2008  . Septoplasty    . Colon surgery  2007    Resection  . Abdominal aortic aneurysm repair N/A 11/08/2014    Procedure: ANEURYSM ABDOMINAL AORTIC REPAIR;  Surgeon: Rosetta Posner, MD;  [facility] OR;  Service: Vascular;  Laterality: N/A;  . Abdominal aortic aneurysm repair      Allergies  Allergen Reactions  . Doxycycline Rash    Current Outpatient Prescriptions  Medication Sig Dispense Refill  . acetaminophen (TYLENOL) 500 MG tablet Take 500-1,000 mg by mouth at bedtime. Take 500  mg daily at bedtime and may take an additional 500mg  tablet if needed for pain    . alendronate (FOSAMAX) 70 MG tablet Take 70 mg by mouth once a week. Friday  11  . clopidogrel (PLAVIX) 75 MG tablet Take 75 mg by mouth daily.    . Fluticasone-Salmeterol (ADVAIR) 250-50 MCG/DOSE AEPB Inhale 1 puff into the lungs every 12 (twelve) hours.    Marland Kitchen HUMALOG KWIKPEN 100 UNIT/ML KiwkPen INJECT 4 UNITS BEFORE BREAKFAST+ LUNCH AND 5 UNITS BEFORE SUPPER.  0  . ipratropium (ATROVENT) 0.02 % nebulizer solution Take 500 mcg by nebulization 2 (two) times daily.    . Ipratropium-Albuterol (COMBIVENT IN) Inhale 2 puffs into the lungs 3 (three) times daily.     Marland Kitchen LEVEMIR FLEXTOUCH 100 UNIT/ML Pen Inject 13 Units into the skin at bedtime.     Marland Kitchen levothyroxine (SYNTHROID, LEVOTHROID) 125 MCG tablet Take 125 mcg by mouth See admin  instructions. Take 1 tablet daily six days a week (Monday through Saturday, none on Sunday)    . losartan-hydrochlorothiazide (HYZAAR) 100-25 MG per tablet Take 0.5 tablets by mouth daily.     . mirtazapine (REMERON) 15 MG tablet Take 15 mg by mouth at bedtime.    . rosuvastatin (CRESTOR) 20 MG tablet Take 10 mg by mouth daily.     . Vitamin D, Ergocalciferol, (DRISDOL) 50000 UNITS CAPS Take 50,000 Units by mouth every 7 (seven) days.    . fluocinonide (LIDEX) 0.05 % external solution Apply 1 application topically 2 (two) times daily as needed. For irritation    . ipratropium-albuterol (DUONEB) 0.5-2.5 (3) MG/3ML SOLN Take 3 mLs by nebulization every 12 (twelve) hours as needed (shortness of breath).     Marland Kitchen omeprazole (PRILOSEC) 20 MG capsule Take 20 mg by mouth daily.    Marland Kitchen triamcinolone cream (KENALOG) 0.1 % Apply 1 application topically 2 (two) times daily as needed (rash).      No current facility-administered medications for this visit.    REVIEW OF SYSTEMS: see HPI for pertinent positives and negatives.    Physical Examination  Filed Vitals:   12/06/15 1156 12/06/15 1205  BP: 142/74 145/73  Pulse: 60 61  Temp: 96.7 F (35.9 C)   TempSrc: Oral   Resp: 16   Height: 6' (1.829 m)   Weight: 196 lb (88.905 kg)   SpO2: 97%    Body mass index is 26.58 kg/(m^2).  General: A&O x 3, WD,.  Pulmonary: Sym exp, good air movt, CTAB, no rales, rhonchi, or wheezing.  Cardiac: RRR, Nl S1, S2, no detected murmur.   Carotid Bruits Left Right   positive negative  Aorta is not palpable Radial pulses are 2 + palpable and =   VASCULAR EXAM:     LE Pulses LEFT RIGHT   FEMORAL  palpable  palpable    POPLITEAL 1+ palpable  1+ palpable   POSTERIOR TIBIAL not palpable   palpable    DORSALIS  PEDIS  ANTERIOR TIBIAL not palpable  1+ palpable      Gastrointestinal: soft, NTND, -G/R, - HSM, - palpable masses, - CVAT B.  Musculoskeletal: M/S 5/5 throughout except left LE is 4/5, Extremities without ischemic changes.  Neurologic: CN 2-12 intact, Pain and light touch intact in extremities are intact, Motor exam as listed above.         Non-Invasive Vascular Imaging  ABI (Date: 12/06/2015)  R: 1.05 (0.94, 09/21/14), DP: biphasic, PT: triphasic, TBI: 0.67  L: 1.12 (0.96), DP: biphasic, PT: triphasic, TBI:  0.64   Medical Decision Making  David [name] is a 81 y.o. male who presents s/p open AAA repair on 11/08/14.  He had a straight graft 24 mm in diameter.  Pt is asymptomatic with no claudication sx's. He does have mild left leg weakness since his stroke but states he is steady on his feet. His walking seem limtied by neuropathy in his feet. ABI's today are normal with bi and triphasic waveforms.  Follow up in a year with ABI's and see me.  Thank you for allowing [place] to participate in this patient's care.  NICKEL, Sharmon Leyden, RN, MSN, FNP-C [facility] Office: [phone]  12/06/2015, 12:16 PM  Clinic MD: Early

## 2015-12-29 ENCOUNTER — Ambulatory Visit (INDEPENDENT_AMBULATORY_CARE_PROVIDER_SITE_OTHER): Payer: Medicare Other | Admitting: Podiatry

## 2015-12-29 ENCOUNTER — Encounter: Payer: Self-pay | Admitting: Podiatry

## 2015-12-29 DIAGNOSIS — Q828 Other specified congenital malformations of skin: Secondary | ICD-10-CM

## 2015-12-29 DIAGNOSIS — B351 Tinea unguium: Secondary | ICD-10-CM | POA: Diagnosis not present

## 2015-12-29 DIAGNOSIS — M79676 Pain in unspecified toe(s): Secondary | ICD-10-CM | POA: Diagnosis not present

## 2015-12-29 NOTE — Progress Notes (Signed)
Patient ID: David [name], male   DOB: 07/16/1931, 82 y.o.   MRN: [id number] Complaint:  Visit Type: Patient returns to my office for continued preventative foot care services. Complaint: Patient states" my nails have grown long and thick and become painful to walk and wear shoes" Patient has been diagnosed with DM with no foot complications. The patient presents for preventative foot care services. No changes to ROS.  Painful callus right foot.  Podiatric Exam: Vascular: dorsalis pedis and posterior tibial pulses are palpable bilateral. Capillary return is immediate. Temperature gradient is WNL. Skin turgor WNL  Sensorium: Absent  Semmes Weinstein monofilament test. Normal tactile sensation bilaterally. Nail Exam: Pt has thick disfigured discolored nails with subungual debris noted bilateral entire nail hallux through fifth toenails Ulcer Exam: There is no evidence of ulcer or pre-ulcerative changes or infection. Orthopedic Exam: Muscle tone and strength are WNL. No limitations in general ROM. No crepitus or effusions noted. Foot type and digits show no abnormalities. Bony prominences are unremarkable. Skin:  Porokeratosis sub5th met right. No infection or ulcers  Diagnosis:  Onychomycosis, , Pain in right toe, pain in left toes, Porokeratosis right foot.  Treatment & Plan Procedures and Treatment: Consent by patient was obtained for treatment procedures. The patient understood the discussion of treatment and procedures well. All questions were answered thoroughly reviewed. Debridement of mycotic and hypertrophic toenails, 1 through 5 bilateral and clearing of subungual debris. No ulceration, no infection noted. Debride porokeratosis Return Visit-Office Procedure: Patient instructed to return to the office for a follow up visit 3 months for continued evaluation and treatment.   Gregory Mayer DPM 

## 2016-01-12 DIAGNOSIS — N401 Enlarged prostate with lower urinary tract symptoms: Secondary | ICD-10-CM | POA: Insufficient documentation

## 2016-01-12 DIAGNOSIS — H269 Unspecified cataract: Secondary | ICD-10-CM | POA: Insufficient documentation

## 2016-03-29 ENCOUNTER — Ambulatory Visit (INDEPENDENT_AMBULATORY_CARE_PROVIDER_SITE_OTHER): Payer: Medicare Other | Admitting: Podiatry

## 2016-03-29 ENCOUNTER — Encounter: Payer: Self-pay | Admitting: Podiatry

## 2016-03-29 DIAGNOSIS — M79676 Pain in unspecified toe(s): Secondary | ICD-10-CM | POA: Diagnosis not present

## 2016-03-29 DIAGNOSIS — B351 Tinea unguium: Secondary | ICD-10-CM

## 2016-03-29 DIAGNOSIS — Q828 Other specified congenital malformations of skin: Secondary | ICD-10-CM | POA: Diagnosis not present

## 2016-03-29 NOTE — Progress Notes (Signed)
Patient ID: David [name], male   DOB: 11/19/1931, 82 y.o.   MRN: [id number] Complaint:  Visit Type: Patient returns to my office for continued preventative foot care services. Complaint: Patient states" my nails have grown long and thick and become painful to walk and wear shoes" Patient has been diagnosed with DM with no foot complications. The patient presents for preventative foot care services. No changes to ROS.  Painful callus right foot.  Podiatric Exam: Vascular: dorsalis pedis and posterior tibial pulses are palpable bilateral. Capillary return is immediate. Temperature gradient is WNL. Skin turgor WNL  Sensorium: Absent  Semmes Weinstein monofilament test. Normal tactile sensation bilaterally. Nail Exam: Pt has thick disfigured discolored nails with subungual debris noted bilateral entire nail hallux through fifth toenails Ulcer Exam: There is no evidence of ulcer or pre-ulcerative changes or infection. Orthopedic Exam: Muscle tone and strength are WNL. No limitations in general ROM. No crepitus or effusions noted. Foot type and digits show no abnormalities. Bony prominences are unremarkable. Skin:  Porokeratosis sub5th met right. No infection or ulcers  Diagnosis:  Onychomycosis, , Pain in right toe, pain in left toes, Porokeratosis right foot.  Treatment & Plan Procedures and Treatment: Consent by patient was obtained for treatment procedures. The patient understood the discussion of treatment and procedures well. All questions were answered thoroughly reviewed. Debridement of mycotic and hypertrophic toenails, 1 through 5 bilateral and clearing of subungual debris. No ulceration, no infection noted. Debride porokeratosis Return Visit-Office Procedure: Patient instructed to return to the office for a follow up visit 3 months for continued evaluation and treatment.   Royalti Schauf DPM 

## 2016-06-22 ENCOUNTER — Encounter: Payer: Self-pay | Admitting: Cardiology

## 2016-06-28 ENCOUNTER — Ambulatory Visit (INDEPENDENT_AMBULATORY_CARE_PROVIDER_SITE_OTHER): Payer: Medicare Other | Admitting: Podiatry

## 2016-06-28 ENCOUNTER — Encounter: Payer: Self-pay | Admitting: Podiatry

## 2016-06-28 DIAGNOSIS — B351 Tinea unguium: Secondary | ICD-10-CM | POA: Diagnosis not present

## 2016-06-28 DIAGNOSIS — M79676 Pain in unspecified toe(s): Secondary | ICD-10-CM

## 2016-06-28 NOTE — Progress Notes (Signed)
Patient ID: David [name], male   DOB: Aug 04, 1931, 82 y.o.   MRN: [id number] Complaint:  Visit Type: Patient returns to my office for continued preventative foot care services. Complaint: Patient states" my nails have grown long and thick and become painful to walk and wear shoes" Patient has been diagnosed with DM with no foot complications. The patient presents for preventative foot care services. No changes to ROS.  Painful callus right foot.  Podiatric Exam: Vascular: dorsalis pedis and posterior tibial pulses are palpable bilateral. Capillary return is immediate. Temperature gradient is WNL. Skin turgor WNL  Sensorium: Absent  Semmes Weinstein monofilament test. Normal tactile sensation bilaterally. Nail Exam: Pt has thick disfigured discolored nails with subungual debris noted bilateral entire nail hallux through fifth toenails Ulcer Exam: There is no evidence of ulcer or pre-ulcerative changes or infection. Orthopedic Exam: Muscle tone and strength are WNL. No limitations in general ROM. No crepitus or effusions noted. Foot type and digits show no abnormalities. Bony prominences are unremarkable. Skin:  . No infection or ulcers.  Asymptomatic porokeratosis  Diagnosis:  Onychomycosis, , Pain in right toe, pain in left toes,   Treatment & Plan Procedures and Treatment: Consent by patient was obtained for treatment procedures. The patient understood the discussion of treatment and procedures well. All questions were answered thoroughly reviewed. Debridement of mycotic and hypertrophic toenails, 1 through 5 bilateral and clearing of subungual debris. No ulceration, no infection noted.  Return Visit-Office Procedure: Patient instructed to return to the office for a follow up visit 3 months for continued evaluation and treatment.   Gardiner Barefoot DPM

## 2016-08-29 DIAGNOSIS — R310 Gross hematuria: Secondary | ICD-10-CM | POA: Insufficient documentation

## 2016-08-29 DIAGNOSIS — R300 Dysuria: Secondary | ICD-10-CM | POA: Insufficient documentation

## 2016-09-20 ENCOUNTER — Encounter: Payer: Self-pay | Admitting: Podiatry

## 2016-09-20 ENCOUNTER — Ambulatory Visit (INDEPENDENT_AMBULATORY_CARE_PROVIDER_SITE_OTHER): Payer: Medicare Other | Admitting: Podiatry

## 2016-09-20 VITALS — BP 130/85 | HR 66 | Resp 14

## 2016-09-20 DIAGNOSIS — B351 Tinea unguium: Secondary | ICD-10-CM

## 2016-09-20 DIAGNOSIS — M79676 Pain in unspecified toe(s): Secondary | ICD-10-CM

## 2016-09-20 NOTE — Progress Notes (Signed)
Patient ID: David [name], male   DOB: [birth year], 82 y.o.   MRN: [id number] Complaint:  Visit Type: Patient returns to my office for continued preventative foot care services. Complaint: Patient states" my nails have grown long and thick and become painful to walk and wear shoes" Patient has been diagnosed with DM with no foot complications. The patient presents for preventative foot care services. No changes to ROS.  Painful callus right foot.  Podiatric Exam: Vascular: dorsalis pedis and posterior tibial pulses are palpable bilateral. Capillary return is immediate. Temperature gradient is WNL. Skin turgor WNL  Sensorium: Absent  Semmes Weinstein monofilament test. Normal tactile sensation bilaterally. Nail Exam: Pt has thick disfigured discolored nails with subungual debris noted bilateral entire nail hallux through fifth toenails Ulcer Exam: There is no evidence of ulcer or pre-ulcerative changes or infection. Orthopedic Exam: Muscle tone and strength are WNL. No limitations in general ROM. No crepitus or effusions noted. Foot type and digits show no abnormalities. Bony prominences are unremarkable. Skin:  . No infection or ulcers.  Asymptomatic porokeratosis  Diagnosis:  Onychomycosis, , Pain in right toe, pain in left toes,   Treatment & Plan Procedures and Treatment: Consent by patient was obtained for treatment procedures. The patient understood the discussion of treatment and procedures well. All questions were answered thoroughly reviewed. Debridement of mycotic and hypertrophic toenails, 1 through 5 bilateral and clearing of subungual debris. No ulceration, no infection noted.  Return Visit-Office Procedure: Patient instructed to return to the office for a follow up visit 3 months for continued evaluation and treatment.   Gardiner Barefoot DPM

## 2016-10-01 ENCOUNTER — Ambulatory Visit (INDEPENDENT_AMBULATORY_CARE_PROVIDER_SITE_OTHER): Payer: Medicare Other | Admitting: Podiatry

## 2016-10-01 ENCOUNTER — Encounter: Payer: Self-pay | Admitting: Podiatry

## 2016-10-01 DIAGNOSIS — Q828 Other specified congenital malformations of skin: Secondary | ICD-10-CM | POA: Diagnosis not present

## 2016-10-01 DIAGNOSIS — E1151 Type 2 diabetes mellitus with diabetic peripheral angiopathy without gangrene: Secondary | ICD-10-CM | POA: Diagnosis not present

## 2016-10-01 DIAGNOSIS — E1149 Type 2 diabetes mellitus with other diabetic neurological complication: Secondary | ICD-10-CM | POA: Diagnosis not present

## 2016-10-01 DIAGNOSIS — R52 Pain, unspecified: Secondary | ICD-10-CM | POA: Diagnosis not present

## 2016-10-01 DIAGNOSIS — E1140 Type 2 diabetes mellitus with diabetic neuropathy, unspecified: Secondary | ICD-10-CM | POA: Diagnosis not present

## 2016-10-01 NOTE — Progress Notes (Signed)
Diabetic sho

## 2016-10-10 ENCOUNTER — Telehealth: Payer: Self-pay | Admitting: *Deleted

## 2016-10-10 DIAGNOSIS — J309 Allergic rhinitis, unspecified: Secondary | ICD-10-CM | POA: Insufficient documentation

## 2016-10-10 NOTE — Telephone Encounter (Signed)
-----   Message from Marlou Sa sent at 10/10/2016 10:42 AM EDT ----- Regarding: diabetic shoes Patient came in stating that his primary care doctor has not received paperwork for his diabetic shoes. He was seen a few weeks ago and mentioned that he needed the shoes. Please advise

## 2016-10-10 NOTE — Telephone Encounter (Signed)
Called patient and explained he was just in last week 10/01/16 the diabetic shoe paperwork can take up to 4 to 6 weeks to get completed.  Once all signed and approved paperwork is received he will get a phone call to come in to be measured and choose his shoes.  Patient stated understanding.

## 2016-12-04 ENCOUNTER — Encounter: Payer: Self-pay | Admitting: Family

## 2016-12-06 ENCOUNTER — Ambulatory Visit (INDEPENDENT_AMBULATORY_CARE_PROVIDER_SITE_OTHER): Payer: Medicare Other | Admitting: Podiatry

## 2016-12-06 ENCOUNTER — Encounter: Payer: Self-pay | Admitting: Podiatry

## 2016-12-06 VITALS — Ht 72.0 in | Wt 196.0 lb

## 2016-12-06 DIAGNOSIS — Q828 Other specified congenital malformations of skin: Secondary | ICD-10-CM

## 2016-12-06 DIAGNOSIS — B351 Tinea unguium: Secondary | ICD-10-CM

## 2016-12-06 DIAGNOSIS — M79676 Pain in unspecified toe(s): Secondary | ICD-10-CM | POA: Diagnosis not present

## 2016-12-06 DIAGNOSIS — E1151 Type 2 diabetes mellitus with diabetic peripheral angiopathy without gangrene: Secondary | ICD-10-CM

## 2016-12-06 NOTE — Progress Notes (Signed)
Patient ID: David [name], male   DOB: May 01, 1931, 82 y.o.   MRN: [id number] Complaint:  Visit Type: Patient returns to my office for continued preventative foot care services. Complaint: Patient states" my nails have grown long and thick and become painful to walk and wear shoes" Patient has been diagnosed with DM with no foot complications. The patient presents for preventative foot care services. No changes to ROS.  Painful callus right foot.  Podiatric Exam: Vascular: dorsalis pedis and posterior tibial pulses are palpable bilateral. Capillary return is immediate. Temperature gradient is WNL. Skin turgor WNL  Sensorium: Absent  Semmes Weinstein monofilament test. Normal tactile sensation bilaterally. Nail Exam: Pt has thick disfigured discolored nails with subungual debris noted bilateral entire nail hallux through fifth toenails Ulcer Exam: There is no evidence of ulcer or pre-ulcerative changes or infection. Orthopedic Exam: Muscle tone and strength are WNL. No limitations in general ROM. No crepitus or effusions noted. Foot type and digits show no abnormalities. Bony prominences are unremarkable. Skin:  . No infection or ulcers.  Asymptomatic porokeratosis  Diagnosis:  Onychomycosis, , Pain in right toe, pain in left toes,   Treatment & Plan Procedures and Treatment: Consent by patient was obtained for treatment procedures. The patient understood the discussion of treatment and procedures well. All questions were answered thoroughly reviewed. Debridement of mycotic and hypertrophic toenails, 1 through 5 bilateral and clearing of subungual debris. No ulceration, no infection noted.  Return Visit-Office Procedure: Patient instructed to return to the office for a follow up visit 3 months for continued evaluation and treatment.   Gardiner Barefoot DPM

## 2016-12-11 ENCOUNTER — Ambulatory Visit (HOSPITAL_COMMUNITY)
Admission: RE | Admit: 2016-12-11 | Discharge: 2016-12-11 | Disposition: A | Discharge status: Home / Self Care | Payer: Medicare Other | Source: Ambulatory Visit | Attending: Family | Admitting: Family

## 2016-12-11 ENCOUNTER — Encounter: Payer: Self-pay | Admitting: Family

## 2016-12-11 ENCOUNTER — Ambulatory Visit (INDEPENDENT_AMBULATORY_CARE_PROVIDER_SITE_OTHER): Payer: Medicare Other | Admitting: Family

## 2016-12-11 VITALS — BP 135/68 | HR 62 | Temp 97.4°F | Resp 20 | Ht 72.0 in | Wt 191.5 lb

## 2016-12-11 DIAGNOSIS — Z9889 Other specified postprocedural states: Secondary | ICD-10-CM

## 2016-12-11 DIAGNOSIS — I6529 Occlusion and stenosis of unspecified carotid artery: Secondary | ICD-10-CM

## 2016-12-11 DIAGNOSIS — R0989 Other specified symptoms and signs involving the circulatory and respiratory systems: Secondary | ICD-10-CM

## 2016-12-11 DIAGNOSIS — Z8679 Personal history of other diseases of the circulatory system: Secondary | ICD-10-CM | POA: Insufficient documentation

## 2016-12-11 DIAGNOSIS — Z48812 Encounter for surgical aftercare following surgery on the circulatory system: Secondary | ICD-10-CM | POA: Diagnosis not present

## 2016-12-11 NOTE — Progress Notes (Signed)
[facility] [place]  CC: Follow up Open AAA Repair  History of Present Illness  David [name] is a 82 y.o. (Jul 07, 1931) male patient of Dr. Donnetta Hutching who returns today for follow-up of open juxtarenal aneurysm repair. He had a straight graft 24 mm in diameter. Surgery was on 11/08/2014.   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 He reports intermittent neuropathy sensation in feet: tingling, numbness, burning. He denies slow healing wounds.  States he can walk about 1/4 mile before legs feel tired, left leg moreso since stroke. He also has a history lumbar disc issues, but this has not bothered him lately.  He is taking Plavix and a statin daily.  He reports 2 strokes in 2006, wife states confirmed by MRI of his head. Carotid duplex in September 2015 showed <40% bilateral ICA stenoses.   He has had a cough for 2 months, states his PCP is aware, has been treated with antibx and prednisone.  He noted hematuria in September 2017, which resolved 10 days later; he will have a CT of his abdomen tomorrow to evaluate this.  Pt Diabetic: Yes, Pt states his last A1C was 6.1, in control.  Pt smoker: former smoker, quit in 2006 when he was diagnosed with colon and thyroid cancer   Past Medical History:  Diagnosis Date  . AAA (abdominal aortic aneurysm) ([place])   . AAA (abdominal aortic aneurysm) ([place])   . AAA (abdominal aortic aneurysm) ([place])   . Atherosclerosis    pt denies this  . BPH (benign prostatic hyperplasia)   . CKD (chronic kidney disease), stage III    family reports caused by scans but that has resolved since CT scans have stopped  . Colon cancer ([place])   . COPD (chronic obstructive pulmonary disease) ([place])   . Diabetes mellitus   . ED (erectile dysfunction)   . Gastroesophageal reflux disease   . Hyperlipidemia   . Hypertension   . Lung nodules   . Osteopenia   . Peripheral vascular disease ([place])   . Pneumonia June 2013  . Positional  vertigo    intermittent   . Shortness of breath    chronic per patient  . Stroke ([place])   . Stroke ([place])   . Thyroid cancer ([place])   . Thyroid disease   . Type 2 diabetes mellitus ([place])   . Vitamin D deficiency     Past Surgical History:  Procedure Laterality Date  . ABDOMINAL AORTIC ANEURYSM REPAIR N/A 11/08/2014   Procedure: ANEURYSM ABDOMINAL AORTIC REPAIR;  Surgeon: Rosetta Posner, MD;  Location: [facility];  Service: Vascular;  Laterality: N/A;  . ABDOMINAL AORTIC ANEURYSM REPAIR    . COLON RESECTION  2007  . COLON SURGERY  2007   Resection  . SEPTOPLASTY    . THYROIDECTOMY  2008   Social History Social History   Social History  . Marital status: Married    Spouse name: N/A  . Number of children: N/A  . Years of education: N/A   Occupational History  . Retired Retired   Social History Main Topics  . Smoking status: Former Smoker    Packs/day: 1.50    Years: 50.00    Types: Cigarettes    Quit date: 05/28/2005  . Smokeless tobacco: Never Used  . Alcohol use No  . Drug use: No  . Sexual activity: Not Currently   Other Topics Concern  . Not on file   Social History Narrative  .  No narrative on file   Family History Family History  Problem Relation Age of Onset  . Stomach cancer Mother   . Heart failure Mother   . Breast cancer Mother   . Pneumonia Father   . Diabetes Brother   . Heart disease Brother     Heart Disease before age 86,  AAA and  Carotid  . Heart attack Brother   . Diabetes Paternal Uncle    Current Outpatient Prescriptions on File Prior to Visit  Medication Sig Dispense Refill  . acetaminophen (TYLENOL) 500 MG tablet Take 500-1,000 mg by mouth at bedtime. Take 500 mg daily at bedtime and may take an additional 500mg  tablet if needed for pain    . alendronate (FOSAMAX) 70 MG tablet Take 70 mg by mouth once a week. Friday  11  . clopidogrel (PLAVIX) 75 MG tablet Take 75 mg by mouth daily.    . fluocinonide (LIDEX) 0.05 % external solution Apply 1  application topically 2 (two) times daily as needed. For irritation    . HUMALOG KWIKPEN 100 UNIT/ML KiwkPen INJECT 4 UNITS BEFORE BREAKFAST+ LUNCH AND 5 UNITS BEFORE SUPPER.  0  . ipratropium (ATROVENT) 0.02 % nebulizer solution Take 500 mcg by nebulization 2 (two) times daily.    . Ipratropium-Albuterol (COMBIVENT IN) Inhale 2 puffs into the lungs 3 (three) times daily.     Marland Kitchen LEVEMIR FLEXTOUCH 100 UNIT/ML Pen Inject 13 Units into the skin at bedtime.     Marland Kitchen levothyroxine (SYNTHROID, LEVOTHROID) 125 MCG tablet Take 125 mcg by mouth See admin instructions. Take 1 tablet daily seven days a week plus 1/2 tablet on Saturday    . losartan-hydrochlorothiazide (HYZAAR) 100-25 MG per tablet Take 0.5 tablets by mouth daily.     . mirtazapine (REMERON) 15 MG tablet Take 15 mg by mouth at bedtime.    Marland Kitchen omeprazole (PRILOSEC) 20 MG capsule Take 20 mg by mouth 3 times/day as needed-between meals & bedtime.     . rosuvastatin (CRESTOR) 20 MG tablet Take 10 mg by mouth daily.     . Fluticasone-Salmeterol (ADVAIR) 250-50 MCG/DOSE AEPB Inhale 1 puff into the lungs every 12 (twelve) hours.    Marland Kitchen ipratropium-albuterol (DUONEB) 0.5-2.5 (3) MG/3ML SOLN Take 3 mLs by nebulization every 12 (twelve) hours as needed (shortness of breath).     . triamcinolone cream (KENALOG) 0.1 % Apply 1 application topically 2 (two) times daily as needed (rash).     . Vitamin D, Ergocalciferol, (DRISDOL) 50000 UNITS CAPS Take 50,000 Units by mouth every 7 (seven) days.     No current facility-administered medications on file prior to visit.    Allergies  Allergen Reactions  . Doxycycline Rash    ROS: See HPI for pertinent positives and negatives.    Physical Examination  Vitals:   12/11/16 1137  BP: 135/68  Pulse: 62  Resp: 20  Temp: 97.4 F (36.3 C)  TempSrc: Oral  SpO2: 97%  Weight: 191 lb 8 oz (86.9 kg)  Height: 6' (1.829 m)   Body mass index is 25.97 kg/m.  General: A&O x 3, WD,.  Pulmonary: Sym exp,  respirations are non labored, good air movt, CTAB, no rales, rhonchi, or wheezing.  Cardiac: RRR, Nl S1, S2, no detected murmur.   Carotid Bruits Left Right   positive negative  Aorta is not palpable Radial pulses are 2 + palpable and =   VASCULAR EXAM:     LE Pulses LEFT RIGHT   FEMORAL  palpable  palpable    POPLITEAL 3+ palpable  1+ palpable   POSTERIOR TIBIAL 3+ palpable   1+palpable    DORSALIS PEDIS  ANTERIOR TIBIAL 1+ palpable  1+ palpable      Gastrointestinal: soft, NTND, -G/R, - HSM, - palpable masses, - CVAT B.  Musculoskeletal: M/S 5/5 throughout except left LE is 4/5, Extremities without ischemic changes. All toenails are thick but closely trimmed.  Neurologic: CN 2-12 intact, Pain and light touch intact in extremities are intact, Motor exam as listed above.    Non-Invasive Vascular Imaging  ABI (Date: 12-11-16)  Right: PTA 1.15 tri, DPA 1.01, bi, TBI 0.76.  Left: PTA 1.01, bi, DPA 1.04, bi, TBI 0.63.   Medical Decision Making  David [name] is a 82 y.o. male who presents s/p open AAA repair on 11/08/14.  He had a straight graft 24 mm in diameter.  Pt is asymptomatic with no claudication sx's. He does have mild left leg weakness since his stroke but states he is steady on his feet. His walking seem limtied by neuropathy in his feet. ABI's today remain normal with bi and triphasic waveforms.  Will check carotid duplex in 2020, 5 years after his last one which showed <40% bilateral ICA stenoses.    I discussed with the patient the importance of surveillance.  The next ABI will be scheduled for 18 months, will also check left popliteal artery duplex at that time, indication is prominent left popliteal pulse, no popliteal duplex result on  file.   The patient will follow up with [place] in 18 months with these studies.  I discussed in depth with the patient the nature of atherosclerosis, and emphasized the importance of maximal medical management including strict control of blood pressure, blood glucose, and lipid levels, obtaining regular exercise, and cessation of smoking.    The patient is aware that without maximal medical management the underlying atherosclerotic disease process will progress, limiting the benefit of any interventions.   Thank you for allowing [place] to participate in this patient's care.  Clemon Chambers, RN, MSN, FNP-C [facility] [place] [facility] Office: [phone]   Clinic Physician: Early   12/11/2016, 12:09 PM

## 2016-12-11 NOTE — Addendum Note (Signed)
Addended by: Lianne Cure A on: 12/11/2016 03:14 PM   Modules accepted: Orders

## 2016-12-27 ENCOUNTER — Ambulatory Visit: Payer: Medicare Other | Admitting: *Deleted

## 2016-12-27 DIAGNOSIS — E1151 Type 2 diabetes mellitus with diabetic peripheral angiopathy without gangrene: Secondary | ICD-10-CM

## 2016-12-27 NOTE — Progress Notes (Signed)
Patient ID: David [name], male   DOB: 26-Jul-1931, 83 y.o.   MRN: [id number]  Patient presents to be scanned and measured for diabetic shoes and inserts.

## 2017-01-30 ENCOUNTER — Other Ambulatory Visit: Payer: Self-pay | Admitting: Urology

## 2017-01-31 ENCOUNTER — Encounter: Payer: Self-pay | Admitting: Podiatry

## 2017-01-31 ENCOUNTER — Ambulatory Visit (INDEPENDENT_AMBULATORY_CARE_PROVIDER_SITE_OTHER): Payer: Medicare Other | Admitting: Podiatry

## 2017-01-31 DIAGNOSIS — Q6650 Congenital pes planus, unspecified foot: Secondary | ICD-10-CM | POA: Diagnosis not present

## 2017-01-31 DIAGNOSIS — E1151 Type 2 diabetes mellitus with diabetic peripheral angiopathy without gangrene: Secondary | ICD-10-CM

## 2017-01-31 DIAGNOSIS — R0989 Other specified symptoms and signs involving the circulatory and respiratory systems: Secondary | ICD-10-CM | POA: Diagnosis not present

## 2017-01-31 NOTE — Progress Notes (Signed)
Patient ID: David [name], male   DOB: 10-09-1931, 83 y.o.   MRN: [id number] Complaint:  Visit Type: Patient returns to my office for his diabetic shoes.  Podiatric Exam: Vascular: dorsalis pedis and posterior tibial  Pulses are diminished  bilateral. Capillary return is immediate. Temperature gradient is WNL. Skin turgor WNL  Sensorium: Absent  Semmes Weinstein monofilament test. Normal tactile sensation bilaterally. Nail Exam: Pt has thick disfigured discolored nails with subungual debris noted bilateral entire nail hallux through fifth toenails Ulcer Exam: There is no evidence of ulcer or pre-ulcerative changes or infection. Orthopedic Exam: Muscle tone and strength are WNL. No limitations in general ROM. No crepitus or effusions noted. Foot type and digits show no abnormalities. Bony prominences are unremarkable. Skin:  . No infection or ulcers.  Asymptomatic porokeratosis   Diabetes with neuropathy  Diabetes with angiopathy  ROV  Dispense shoes.  Patient presents today and was dispensed 0ne pair ( two units) of medically necessary extra depth shoes with three pair( six units) of custom molded multiple density inserts. The shoes and the inserts are fitted to the patients ' feet and are noted to fit well and are free of defect.  Length and width of the shoes are also acceptable.  Patient was given written and verbal  instructions for wearing.  If any concerns arrive with the shoes or inserts, the patient is to call the office.Patient is to follow up with doctor in six weeks.   Gardiner Barefoot DPM

## 2017-02-04 ENCOUNTER — Encounter (HOSPITAL_COMMUNITY): Payer: Self-pay

## 2017-02-04 ENCOUNTER — Encounter (HOSPITAL_COMMUNITY)
Admission: RE | Admit: 2017-02-04 | Discharge: 2017-02-04 | Disposition: A | Discharge status: Home / Self Care | Payer: Medicare Other | Source: Ambulatory Visit | Attending: Urology | Admitting: Urology

## 2017-02-04 DIAGNOSIS — N179 Acute kidney failure, unspecified: Secondary | ICD-10-CM | POA: Insufficient documentation

## 2017-02-04 DIAGNOSIS — N183 Chronic kidney disease, stage 3 (moderate): Secondary | ICD-10-CM | POA: Diagnosis not present

## 2017-02-04 DIAGNOSIS — I714 Abdominal aortic aneurysm, without rupture: Secondary | ICD-10-CM | POA: Diagnosis not present

## 2017-02-04 DIAGNOSIS — D494 Neoplasm of unspecified behavior of bladder: Secondary | ICD-10-CM | POA: Diagnosis not present

## 2017-02-04 DIAGNOSIS — Z01812 Encounter for preprocedural laboratory examination: Secondary | ICD-10-CM | POA: Diagnosis present

## 2017-02-04 DIAGNOSIS — E1122 Type 2 diabetes mellitus with diabetic chronic kidney disease: Secondary | ICD-10-CM | POA: Insufficient documentation

## 2017-02-04 DIAGNOSIS — I129 Hypertensive chronic kidney disease with stage 1 through stage 4 chronic kidney disease, or unspecified chronic kidney disease: Secondary | ICD-10-CM | POA: Insufficient documentation

## 2017-02-04 DIAGNOSIS — Z01810 Encounter for preprocedural cardiovascular examination: Secondary | ICD-10-CM | POA: Insufficient documentation

## 2017-02-04 HISTORY — DX: Myoneural disorder, unspecified: G70.9

## 2017-02-04 HISTORY — DX: Cardiac murmur, unspecified: R01.1

## 2017-02-04 LAB — BASIC METABOLIC PANEL
ANION GAP: 7 (ref 5–15)
BUN: 33 mg/dL — ABNORMAL HIGH (ref 6–20)
CHLORIDE: 105 mmol/L (ref 101–111)
CO2: 26 mmol/L (ref 22–32)
Calcium: 9.3 mg/dL (ref 8.9–10.3)
Creatinine, Ser: 1.66 mg/dL — ABNORMAL HIGH (ref 0.61–1.24)
GFR calc non Af Amer: 37 mL/min — ABNORMAL LOW (ref >60)
GFR, EST AFRICAN AMERICAN: 42 mL/min — AB (ref >60)
GLUCOSE: 208 mg/dL — AB (ref 65–99)
Potassium: 4.5 mmol/L (ref 3.5–5.1)
Sodium: 138 mmol/L (ref 135–145)

## 2017-02-04 LAB — GLUCOSE, CAPILLARY: Glucose-Capillary: 213 mg/dL — ABNORMAL HIGH (ref 65–99)

## 2017-02-04 NOTE — Progress Notes (Signed)
BMP routed to Dr. Jeffie Pollock via epic

## 2017-02-04 NOTE — Progress Notes (Signed)
OV 01/24/17 clearance 01/29/17 01/31/17 ov  Cbc 01/18/17  HGB a1c  01/18/17  All  On chart.

## 2017-02-04 NOTE — Patient Instructions (Signed)
LARREN PLAYFORD  02/04/2017   Your procedure is scheduled on: 02/12/17  Report to [facility] Main  Entrance take [facility]  elevators to 3rd floor to  [place] at AM.  Call this number if you have problems the morning of surgery [phone]   Remember: ONLY 1 PERSON MAY GO WITH YOU TO SHORT STAY TO GET  READY MORNING OF [place].  Do not eat food or drink liquids :After Midnight.     Take these medicines the morning of surgery with A SIP OF WATER: SYNTHROID,   Albuterol, (and bring),   Advair( and bring) DO NOT TAKE ANY DIABETIC MEDICATIONS DAY OF YOUR SURGERY                               You may not have any metal on your body including hair pins and              piercings  Do not wear jewelry,lotions, powders or perfumes, deodorant             Men may shave face and neck.   Do not bring valuables to the hospital. [place].  Contacts, dentures or bridgework may not be worn into surgery.     Patients discharged the day of surgery will not be allowed to drive home.  Name and phone number of your driver:  Special Instructions: N/A              Please read over the following fact sheets you were given: _____________________________________________________________________             [facility] - Preparing for Surgery Before surgery, you can play an important role.  Because skin is not sterile, your skin needs to be as free of germs as possible.  You can reduce the number of germs on your skin by washing with CHG (chlorahexidine gluconate) soap before surgery.  CHG is an antiseptic cleaner which kills germs and bonds with the skin to continue killing germs even after washing. Please DO NOT use if you have an allergy to CHG or antibacterial soaps.  If your skin becomes reddened/irritated stop using the CHG and inform your nurse when you arrive at Short Stay. Do not shave (including legs and  underarms) for at least 48 hours prior to the first CHG shower.  You may shave your face/neck. Please follow these instructions carefully:  1.  Shower with CHG Soap the night before surgery and the  morning of Surgery.  2.  If you choose to wash your hair, wash your hair first as usual with your  normal  shampoo.  3.  After you shampoo, rinse your hair and body thoroughly to remove the  shampoo.                           4.  Use CHG as you would any other liquid soap.  You can apply chg directly  to the skin and wash                       Gently with a scrungie or clean washcloth.  5.  Apply the CHG Soap to your  body ONLY FROM THE NECK DOWN.   Do not use on face/ open                           Wound or open sores. Avoid contact with eyes, ears mouth and genitals (private parts).                       Wash face,  Genitals (private parts) with your normal soap.             6.  Wash thoroughly, paying special attention to the area where your surgery  will be performed.  7.  Thoroughly rinse your body with warm water from the neck down.  8.  DO NOT shower/wash with your normal soap after using and rinsing off  the CHG Soap.                9.  Pat yourself dry with a clean towel.            10.  Wear clean pajamas.            11.  Place clean sheets on your bed the night of your first shower and do not  sleep with pets. Day of Surgery : Do not apply any lotions/deodorants the morning of surgery.  Please wear clean clothes to the hospital/surgery center.  FAILURE TO FOLLOW THESE INSTRUCTIONS MAY RESULT IN THE CANCELLATION OF YOUR SURGERY PATIENT SIGNATURE_________________________________  NURSE SIGNATURE__________________________________  ________________________________________________________________________ How to Manage Your Diabetes Before and After Surgery  Why is it important to control my blood sugar before and after surgery? . Improving blood sugar levels before and after surgery helps  healing and can limit problems. . A way of improving blood sugar control is eating a healthy diet by: o  Eating less sugar and carbohydrates o  Increasing activity/exercise o  Talking with your doctor about reaching your blood sugar goals . High blood sugars (greater than 180 mg/dL) can raise your risk of infections and slow your recovery, so you will need to focus on controlling your diabetes during the weeks before surgery. . Make sure that the doctor who takes care of your diabetes knows about your planned surgery including the date and location.  How do I manage my blood sugar before surgery? . Check your blood sugar at least 4 times a day, starting 2 days before surgery, to make sure that the level is not too high or low. o Check your blood sugar the morning of your surgery when you wake up and every 2 hours until you get to the Short Stay unit. . If your blood sugar is less than 70 mg/dL, you will need to treat for low blood sugar: o Do not take insulin. o Treat a low blood sugar (less than 70 mg/dL) with  cup of clear juice (cranberry or apple), 4 glucose tablets, OR glucose gel. o Recheck blood sugar in 15 minutes after treatment (to make sure it is greater than 70 mg/dL). If your blood sugar is not greater than 70 mg/dL on recheck, call [phone] for further instructions. . Report your blood sugar to the short stay nurse when you get to Short Stay.  . If you are admitted to the hospital after surgery: o Your blood sugar will be checked by the staff and you will probably be given insulin after surgery (instead of oral diabetes medicines) to make sure you have good blood  sugar levels. o The goal for blood sugar control after surgery is 80-180 mg/dL.   WHAT DO I DO ABOUT MY DIABETES MEDICATION?  Marland Kitchen Do not take oral diabetes medicines (pills) the morning of surgery.  . THE NIGHT BEFORE SURGERY, take 50% of Levemir dose   7.5     units of       insulin.       . THE MORNING OF  SURGERY, take 0  units of     insulin.  . The day of surgery, do not take other diabetes injectables, including Byetta (exenatide), Bydureon (exenatide ER), Victoza (liraglutide), or Trulicity (dulaglutide).  . If your CBG is greater than 220 mg/dL, you may take  of your sliding scale  . (correction) dose of insulin.    Patient Signature:  Date:   Nurse Signature:  Date:   Reviewed and Endorsed by [facility] Patient Education Committee, August 2015

## 2017-02-07 NOTE — Progress Notes (Signed)
02/04/17 final ekg epic

## 2017-02-11 NOTE — H&P (Signed)
CC/HPI: I have blood in my urine.     Mr David [name] returns today in f/u for cystoscopy to complete his hematuria w/u. He has had no further bleeding or siginficant voiding complaints. On CT no specific GU findings other than an enlarged prostate was reported but on my review there appeared to be a small lesion at the left bladder base adjacent to the prostatic middle lobe. He has no associated signs or symptoms.     ALLERGIES: Doxycycline Zithromax PACK    MEDICATIONS: Crestor 10 mg tablet  Levothyroxine Sodium 125 mcg tablet  Advair Diskus 250 mcg-50 mcg/dose blister, with inhalation device  Alendronate Sodium 70 mg tablet  Clopidogrel 75 mg tablet  Combivent Respimat 20 mcg-100 mcg/actuation mist inhaler  Humalog  Levemir  Losartan-Hydrochlorothiazide 100 mg-25 mg tablet  Mirtazapine 15 mg tablet     GU PSH: Locm 300-399Mg /Ml Iodine,1Ml - 12/12/2016      PSH Notes: repair of abdominal aneurysm   NON-GU PSH: Colon Resection Remove Thyroid    GU PMH: Gross hematuria - 12/05/2016      PMH Notes: stomach ulcers     NON-GU PMH: Abdominal aortic aneurysm Colon Cancer, History COPD Diabetes Type 2 GERD Gout Hypercholesterolemia Hypertension Hypothyroidism Stroke/TIA Thyroid Cancer, History    FAMILY HISTORY: Aneurysm - Brother Cancer - Mother Dementia - Brother, Sister Diabetes - Brother Heart Attack - Brother Tuberculosis - Father   SOCIAL HISTORY: Marital Status: Married Current Smoking Status: Patient does not smoke anymore.  Drinks 4+ caffeinated drinks per day.     Notes: 2 sons, 1 daughter He smoked for 50 years 1.5ppd and quit in 2006.   REVIEW OF SYSTEMS:    GU Review Male:   Patient reports frequent urination and erection problems. Patient denies hard to postpone urination, burning/ pain with urination, get up at night to urinate, leakage of urine, stream starts and stops, trouble starting your stream, have to strain to urinate , and penile pain.   Gastrointestinal (Upper):   Patient denies nausea, vomiting, and indigestion/ heartburn.  Gastrointestinal (Lower):   Patient denies diarrhea and constipation.  Constitutional:   Patient denies fever, night sweats, weight loss, and fatigue.  Skin:   Patient denies skin rash/ lesion and itching.  Eyes:   Patient denies blurred vision and double vision.  Ears/ Nose/ Throat:   Patient denies sore throat and sinus problems.  Hematologic/Lymphatic:   Patient denies easy bruising and swollen glands.  Cardiovascular:   Patient denies leg swelling and chest pains.  Respiratory:   Patient reports cough and shortness of breath.   Endocrine:   Patient denies excessive thirst.  Musculoskeletal:   Patient denies back pain and joint pain.  Neurological:   Patient denies headaches and dizziness.  Psychologic:   Patient denies depression and anxiety.   VITAL SIGNS:      01/07/2017 12:17 PM  Weight 190 lb / 86.18 kg  Height 72 in / 182.88 cm  BP 119/69 mmHg  Pulse 51 /min  Temperature 97.0 F / 36 C  BMI 25.8 kg/m   MULTI-SYSTEM PHYSICAL EXAMINATION:    Constitutional: Well-nourished. No physical deformities. Normally developed. Good grooming.  Respiratory: No labored breathing, no use of accessory muscles. CTA  Cardiovascular: Normal temperature, RRR without murmur.     PAST DATA REVIEWED:  Source Of History:  Patient  Urine Test Review:   Urinalysis  X-Ray Review: C.T. Hematuria: Reviewed Films. Reviewed Report. small left bladder lesion possible on my review.  05/23/06 11/27/05  PSA  Total PSA 2.18  1.40     PROCEDURES:         Flexible Cystoscopy - 52000  Risks, benefits, and some of the potential complications of the procedure were discussed. Cipro 500mg  given for antibiotic prophylaxis.     Meatus:  Normal size. Normal location. Normal condition.  Urethra:  No strictures.  External Sphincter:  Normal.  Verumontanum:  Normal.  Prostate:  Obstructing. Moderate hyperplasia.  about 4cm with small middle lobe.  Bladder Neck:  Non-obstructing.  Ureteral Orifices:  Normal location. Normal size. Normal shape. Effluxed clear urine.  Bladder:  Moderate trabeculation. A trigone tumor about 1cm on the left next to the middle lobe. It appears low grade and superficial. Normal mucosa. No stones.      The procedure was well tolerated and there were no complications.  He has a small tumor of the left trigone.         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   I have reviewed the risks of bleeding, infection, injury to the bladder and ureter, chemical cystitis, retention, thrombotic events and anesthetic complications.

## 2017-02-11 NOTE — Anesthesia Preprocedure Evaluation (Signed)
Anesthesia Evaluation  Patient identified by MRN, date of birth, ID band Patient awake    Reviewed: Allergy & Precautions, NPO status , Patient's Chart, lab work & pertinent test results  Airway Mallampati: II  TM Distance: >3 FB Neck ROM: Full    Dental no notable dental hx.    Pulmonary COPD, former smoker,    Pulmonary exam normal breath sounds clear to auscultation       Cardiovascular hypertension, + Peripheral Vascular Disease  Normal cardiovascular exam Rhythm:Regular Rate:Normal     Neuro/Psych CVA negative psych ROS   GI/Hepatic negative GI ROS, Neg liver ROS,   Endo/Other  diabetes  Renal/GU Renal InsufficiencyRenal disease  negative genitourinary   Musculoskeletal negative musculoskeletal ROS (+)   Abdominal   Peds negative pediatric ROS (+)  Hematology negative hematology ROS (+)   Anesthesia Other Findings   Reproductive/Obstetrics negative OB ROS                             Anesthesia Physical Anesthesia Plan  ASA: III  Anesthesia Plan: General   Post-op Pain Management:    Induction: Intravenous  Airway Management Planned: LMA  Additional Equipment:   Intra-op Plan:   Post-operative Plan: Extubation in OR  Informed Consent: I have reviewed the patients History and Physical, chart, labs and discussed the procedure including the risks, benefits and alternatives for the proposed anesthesia with the patient or authorized representative who has indicated his/her understanding and acceptance.   Dental advisory given  Plan Discussed with: CRNA and Surgeon  Anesthesia Plan Comments:         Anesthesia Quick Evaluation

## 2017-02-12 ENCOUNTER — Ambulatory Visit (HOSPITAL_COMMUNITY): Payer: Medicare Other | Admitting: Anesthesiology

## 2017-02-12 ENCOUNTER — Ambulatory Visit (HOSPITAL_COMMUNITY)
Admission: RE | Admit: 2017-02-12 | Discharge: 2017-02-12 | Disposition: A | Discharge status: Home / Self Care | Payer: Medicare Other | Source: Ambulatory Visit | Attending: Urology | Admitting: Urology

## 2017-02-12 ENCOUNTER — Encounter (HOSPITAL_COMMUNITY)
Admission: RE | Disposition: A | Discharge status: Home / Self Care | Payer: Self-pay | Source: Ambulatory Visit | Attending: Urology

## 2017-02-12 ENCOUNTER — Encounter (HOSPITAL_COMMUNITY): Payer: Self-pay | Admitting: *Deleted

## 2017-02-12 DIAGNOSIS — Z87891 Personal history of nicotine dependence: Secondary | ICD-10-CM | POA: Diagnosis not present

## 2017-02-12 DIAGNOSIS — D494 Neoplasm of unspecified behavior of bladder: Secondary | ICD-10-CM | POA: Diagnosis present

## 2017-02-12 DIAGNOSIS — E7800 Pure hypercholesterolemia, unspecified: Secondary | ICD-10-CM | POA: Diagnosis not present

## 2017-02-12 DIAGNOSIS — J449 Chronic obstructive pulmonary disease, unspecified: Secondary | ICD-10-CM | POA: Diagnosis not present

## 2017-02-12 DIAGNOSIS — C678 Malignant neoplasm of overlapping sites of bladder: Secondary | ICD-10-CM | POA: Insufficient documentation

## 2017-02-12 DIAGNOSIS — E1151 Type 2 diabetes mellitus with diabetic peripheral angiopathy without gangrene: Secondary | ICD-10-CM | POA: Insufficient documentation

## 2017-02-12 DIAGNOSIS — K219 Gastro-esophageal reflux disease without esophagitis: Secondary | ICD-10-CM | POA: Insufficient documentation

## 2017-02-12 DIAGNOSIS — N3289 Other specified disorders of bladder: Secondary | ICD-10-CM | POA: Insufficient documentation

## 2017-02-12 DIAGNOSIS — Z794 Long term (current) use of insulin: Secondary | ICD-10-CM | POA: Diagnosis not present

## 2017-02-12 DIAGNOSIS — Z85850 Personal history of malignant neoplasm of thyroid: Secondary | ICD-10-CM | POA: Diagnosis not present

## 2017-02-12 DIAGNOSIS — I10 Essential (primary) hypertension: Secondary | ICD-10-CM | POA: Insufficient documentation

## 2017-02-12 DIAGNOSIS — M109 Gout, unspecified: Secondary | ICD-10-CM | POA: Diagnosis not present

## 2017-02-12 DIAGNOSIS — Z8673 Personal history of transient ischemic attack (TIA), and cerebral infarction without residual deficits: Secondary | ICD-10-CM | POA: Diagnosis not present

## 2017-02-12 DIAGNOSIS — Z881 Allergy status to other antibiotic agents status: Secondary | ICD-10-CM | POA: Diagnosis not present

## 2017-02-12 DIAGNOSIS — E039 Hypothyroidism, unspecified: Secondary | ICD-10-CM | POA: Diagnosis not present

## 2017-02-12 HISTORY — PX: TRANSURETHRAL RESECTION OF BLADDER TUMOR WITH MITOMYCIN-C: SHX6459

## 2017-02-12 LAB — GLUCOSE, CAPILLARY
GLUCOSE-CAPILLARY: 200 mg/dL — AB (ref 65–99)
GLUCOSE-CAPILLARY: 164 mg/dL — AB (ref 65–99)

## 2017-02-12 SURGERY — TRANSURETHRAL RESECTION OF BLADDER TUMOR WITH MITOMYCIN-C
Anesthesia: General | Site: Bladder

## 2017-02-12 MED ORDER — LACTATED RINGERS IV SOLN
INTRAVENOUS | Status: DC | PRN
  Administered 2017-02-12: 07:00:00 via INTRAVENOUS

## 2017-02-12 MED ORDER — FENTANYL CITRATE (PF) 100 MCG/2ML IJ SOLN
INTRAMUSCULAR | Status: DC | PRN
  Administered 2017-02-12 (×2): 50 ug via INTRAVENOUS

## 2017-02-12 MED ORDER — SODIUM CHLORIDE 0.9% FLUSH: 3.0000 mL | INTRAVENOUS | Status: DC | PRN

## 2017-02-12 MED ORDER — CEFAZOLIN SODIUM-DEXTROSE 2-4 GM/100ML-% IV SOLN
2.0000 g | INTRAVENOUS | Status: AC
  Administered 2017-02-12: 2 g via INTRAVENOUS

## 2017-02-12 MED ORDER — LIDOCAINE HCL (CARDIAC) 20 MG/ML IV SOLN
INTRAVENOUS | Status: DC | PRN
  Administered 2017-02-12: 60 mg via INTRATRACHEAL

## 2017-02-12 MED ORDER — DEXAMETHASONE SODIUM PHOSPHATE 10 MG/ML IJ SOLN
INTRAMUSCULAR | Status: DC | PRN
  Administered 2017-02-12: 10 mg via INTRAVENOUS

## 2017-02-12 MED ORDER — FENTANYL CITRATE (PF) 100 MCG/2ML IJ SOLN: 25.0000 ug | INTRAMUSCULAR | Status: DC | PRN

## 2017-02-12 MED ORDER — SODIUM CHLORIDE 0.9% FLUSH: 3.0000 mL | Freq: Two times a day (BID) | INTRAVENOUS | Status: DC

## 2017-02-12 MED ORDER — ACETAMINOPHEN 650 MG RE SUPP
650.0000 mg | RECTAL | Status: DC | PRN
  Filled 2017-02-12: qty 1

## 2017-02-12 MED ORDER — SODIUM CHLORIDE 0.9 % IR SOLN
Status: DC | PRN
  Administered 2017-02-12: 9000 mL

## 2017-02-12 MED ORDER — TRAMADOL-ACETAMINOPHEN 37.5-325 MG PO TABS: 1.0000 | ORAL_TABLET | Freq: Four times a day (QID) | ORAL | 0 refills | Status: DC | PRN

## 2017-02-12 MED ORDER — SODIUM CHLORIDE 0.9 % IV SOLN
50.0000 mg | Freq: Once | INTRAVENOUS | Status: AC
  Administered 2017-02-12: 50 mg via INTRAVESICAL
  Filled 2017-02-12: qty 25

## 2017-02-12 MED ORDER — SODIUM CHLORIDE 0.9 % IV SOLN: 250.0000 mL | INTRAVENOUS | Status: DC | PRN

## 2017-02-12 MED ORDER — PHENYLEPHRINE 40 MCG/ML (10ML) SYRINGE FOR IV PUSH (FOR BLOOD PRESSURE SUPPORT)
PREFILLED_SYRINGE | INTRAVENOUS | Status: AC
  Filled 2017-02-12: qty 10

## 2017-02-12 MED ORDER — OXYCODONE HCL 5 MG PO TABS: 5.0000 mg | ORAL_TABLET | ORAL | Status: DC | PRN

## 2017-02-12 MED ORDER — SUCCINYLCHOLINE CHLORIDE 200 MG/10ML IV SOSY
PREFILLED_SYRINGE | INTRAVENOUS | Status: AC
  Filled 2017-02-12: qty 10

## 2017-02-12 MED ORDER — ONDANSETRON HCL 4 MG/2ML IJ SOLN
INTRAMUSCULAR | Status: AC
  Filled 2017-02-12: qty 2

## 2017-02-12 MED ORDER — HYDROMORPHONE HCL 1 MG/ML IJ SOLN
INTRAMUSCULAR | Status: AC
  Filled 2017-02-12: qty 1

## 2017-02-12 MED ORDER — HYDROMORPHONE HCL 1 MG/ML IJ SOLN
0.2500 mg | INTRAMUSCULAR | Status: DC | PRN
  Administered 2017-02-12 (×2): 0.5 mg via INTRAVENOUS

## 2017-02-12 MED ORDER — CEFAZOLIN SODIUM-DEXTROSE 2-4 GM/100ML-% IV SOLN
INTRAVENOUS | Status: AC
  Filled 2017-02-12: qty 100

## 2017-02-12 MED ORDER — DEXAMETHASONE SODIUM PHOSPHATE 10 MG/ML IJ SOLN
INTRAMUSCULAR | Status: AC
  Filled 2017-02-12: qty 1

## 2017-02-12 MED ORDER — ACETAMINOPHEN 325 MG PO TABS: 650.0000 mg | ORAL_TABLET | ORAL | Status: DC | PRN

## 2017-02-12 MED ORDER — PROPOFOL 10 MG/ML IV BOLUS
INTRAVENOUS | Status: AC
  Filled 2017-02-12: qty 20

## 2017-02-12 MED ORDER — PROPOFOL 10 MG/ML IV BOLUS
INTRAVENOUS | Status: DC | PRN
  Administered 2017-02-12: 50 mg via INTRAVENOUS
  Administered 2017-02-12: 100 mg via INTRAVENOUS
  Administered 2017-02-12: 50 mg via INTRAVENOUS

## 2017-02-12 MED ORDER — PROMETHAZINE HCL 25 MG/ML IJ SOLN: 6.2500 mg | INTRAMUSCULAR | Status: DC | PRN

## 2017-02-12 MED ORDER — MIDAZOLAM HCL 2 MG/2ML IJ SOLN
INTRAMUSCULAR | Status: AC
  Filled 2017-02-12: qty 2

## 2017-02-12 MED ORDER — FENTANYL CITRATE (PF) 100 MCG/2ML IJ SOLN
INTRAMUSCULAR | Status: AC
  Filled 2017-02-12: qty 2

## 2017-02-12 MED ORDER — ONDANSETRON HCL 4 MG/2ML IJ SOLN
INTRAMUSCULAR | Status: DC | PRN
  Administered 2017-02-12: 4 mg via INTRAVENOUS

## 2017-02-12 MED ORDER — LIDOCAINE 2% (20 MG/ML) 5 ML SYRINGE
INTRAMUSCULAR | Status: AC
  Filled 2017-02-12: qty 5

## 2017-02-12 SURGICAL SUPPLY — 18 items
BAG URINE DRAINAGE (UROLOGICAL SUPPLIES) IMPLANT
BAG URO CATCHER STRL LF (MISCELLANEOUS) ×3 IMPLANT
CATH FOLEY 2WAY SLVR  5CC 22FR (CATHETERS) ×2
CATH FOLEY 2WAY SLVR 5CC 22FR (CATHETERS) ×1 IMPLANT
CATH FOLEY 3WAY 30CC 22FR (CATHETERS) IMPLANT
ELECT REM PT RETURN 9FT ADLT (ELECTROSURGICAL)
ELECTRODE REM PT RTRN 9FT ADLT (ELECTROSURGICAL) IMPLANT
GLOVE SURG SS PI 8.0 STRL IVOR (GLOVE) IMPLANT
GOWN STRL REUS W/TWL XL LVL3 (GOWN DISPOSABLE) ×3 IMPLANT
HOLDER FOLEY CATH W/STRAP (MISCELLANEOUS) IMPLANT
LOOP CUT BIPOLAR 24F LRG (ELECTROSURGICAL) ×3 IMPLANT
MANIFOLD NEPTUNE II (INSTRUMENTS) ×3 IMPLANT
PACK CYSTO (CUSTOM PROCEDURE TRAY) ×3 IMPLANT
SET ASPIRATION TUBING (TUBING) IMPLANT
SYR 30ML LL (SYRINGE) IMPLANT
SYRINGE IRR TOOMEY STRL 70CC (SYRINGE) ×3 IMPLANT
TUBING CONNECTING 10 (TUBING) ×2 IMPLANT
TUBING CONNECTING 10' (TUBING) ×1

## 2017-02-12 NOTE — Transfer of Care (Signed)
Immediate Anesthesia Transfer of Care Note  Patient: David [name]  Procedure(s) Performed: Procedure(s): TRANSURETHRAL RESECTION OF BLADDER TUMOR (N/A)  Patient Location: PACU  Anesthesia Type:General  Level of Consciousness: alert , oriented and patient cooperative  Airway & Oxygen Therapy: Patient Spontanous Breathing and Patient connected to face mask oxygen  Post-op Assessment: Report given to RN and Post -op Vital signs reviewed and stable  Post vital signs: stable  Last Vitals:  Vitals:   02/12/17 0526  BP: 139/82  Pulse: 79  Resp: 20  Temp: 36.4 C    Last Pain:  Vitals:   02/12/17 0526  TempSrc: Oral         Complications: No apparent anesthesia complications

## 2017-02-12 NOTE — Progress Notes (Signed)
David [name] up in room walking without complaints.  David [name] also walked down short stay hallway and tolerated well.  His pain is unchanged at 2/10 and he states he is ready to go home.

## 2017-02-12 NOTE — Op Note (Deleted)
  The note originally documented on this encounter has been moved the the encounter in which it belongs.  

## 2017-02-12 NOTE — Addendum Note (Signed)
Addendum  created 02/12/17 1217 by Pilar Grammes, CRNA   Anesthesia Intra Flowsheets edited

## 2017-02-12 NOTE — Brief Op Note (Signed)
02/12/2017  8:04 AM  PATIENT:  [name]  83 y.o. male  PRE-OPERATIVE DIAGNOSIS:  bladder tumor 2-5cm  Overlapping sites   POST-OPERATIVE DIAGNOSIS:  same  PROCEDURE:  Procedure(s): TRANSURETHRAL RESECTION OF BLADDER TUMOR (N/A) 2-5cm INSTILLATION OF EPIRUBICIN (in PACU)  SURGEON:  Surgeon(s) and Role:    * Irine Seal, MD - Primary  PHYSICIAN ASSISTANT:   ASSISTANTS: none   ANESTHESIA:   general  EBL:  Total I/O In: -  Out: 10 [Blood:10]  BLOOD ADMINISTERED:none  DRAINS: 22 fr foley   LOCAL MEDICATIONS USED:  NONE  SPECIMEN:  Source of Specimen:  bladder tumor chips  DISPOSITION OF SPECIMEN:  PATHOLOGY  COUNTS:  YES  TOURNIQUET:  * No tourniquets in log *  DICTATION: .Other Dictation: Dictation Number [id number]  PLAN OF CARE: Discharge to home after PACU  PATIENT DISPOSITION:  PACU - hemodynamically stable.   Delay start of Pharmacological VTE agent (>24hrs) due to surgical blood loss or risk of bleeding: yes

## 2017-02-12 NOTE — Op Note (Signed)
David [name], STETTLER            ACCOUNT NO.:  [id number]  MEDICAL RECORD NO.:  [id number]  LOCATION:                                 FACILITY:  PHYSICIAN:  Marshall Cork. Jeffie Pollock, M.D.    DATE OF BIRTH:  01-23-1931  DATE OF PROCEDURE:  02/12/2017 DATE OF DISCHARGE:                              OPERATIVE REPORT   PROCEDURE: 1. Cystoscopy with transurethral resection of 2-5 cm bladder tumor. 2. Installation of epirubicin (in PACU).  PREOPERATIVE DIAGNOSIS:  Bladder tumor.  POSTOPERATIVE DIAGNOSIS:  Multifocal bladder tumor in the overlapping sites.  SURGEON:  Marshall Cork. Jeffie Pollock, M.D.  ANESTHESIA:  General.  SPECIMEN:  Bladder tumor chips.  DRAINS:  A 22-French Foley catheter.  BLOOD LOSS:  Minimal.  COMPLICATIONS:  None.  INDICATIONS:  Mr. Huser is an 83 year old white male, who was found on evaluation for hematuria to have bladder tumor in the left lateral wall.  He presents today for treatment.  FINDINGS OF PROCEDURE:  He was given Ancef, he was taken to the operating room where general anesthetic was induced.  He was placed in lithotomy position.  His perineum and genitalia were prepped with Betadine solution and he was draped in the usual sterile fashion.  Cystoscopy was performed using the 23-French scope and 30 degree and 70- degree lens.  His examination revealed a normal urethra.  The external sphincter was intact.  The prostatic urethra was short with trilobar hyperplasia with a fairly open bladder neck under anesthesia.  Inspection of the bladder revealed moderate-to-severe trabeculation. There was approximately 2.5 cm patch of papillary tumor on the left lateral wall.  There was about a 1.5 cm patch just medial to the left ureteral orifice along the trigone and then there was about a 1.5 cm patch on the posterior wall and a small lesion at the right bladder neck.  The right ureteral orifice was unremarkable.  The left ureteral orifice was approximately 1 cm to  1.5 cm away from the nearest tumor.  Once thorough inspection was performed, a 28-French continuous flow resectoscope sheath was inserted.  This was fitted with an Beatrix Fetters handle with a 30-degree lens and a bipolar loop.  Saline was used as the irrigant.  Initial resection of the trigonal lesion was performed and the bed of the lesion and surrounding mucosa was generously fulgurated.  I then resected the lesion from the left lateral wall with care being taken to avoid an obturator reflex.  Once this was fully resected, the posterior wall lesions were resected and fulgurated.  At this point, the bladder was evacuated free of chips and on further inspection, a small lesion at the right bladder neck on the prostate was fulgurated as well.  At this point, final inspection revealed no retained chip, no active bleeding, no residual cancer.  Ureteral orifices were intact and effluxing clear urine.  The scope was removed and a 22-French Foley catheter was inserted.  The balloon was filled with 10 mL of sterile fluid.  The catheter was irrigated with clear return and placed to straight drainage.  The patient's anesthetic was reversed.  He was moved to the recovery room in stable condition.  In the recovery room,  his bladder was instilled with 50 mg of epirubicin, which was left indwelling for an hour.  He will be discharged home with the Foley to remove in the morning.  There were no complications.     Marshall Cork. Jeffie Pollock, M.D.   ______________________________ Marshall Cork. Jeffie Pollock, M.D.    JJW/MEDQ  D:  02/12/2017  T:  02/12/2017  Job:  [id number]

## 2017-02-12 NOTE — Anesthesia Procedure Notes (Signed)
Procedure Name: LMA Insertion Date/Time: 02/12/2017 7:24 AM Performed by: Pilar Grammes Pre-anesthesia Checklist: Patient identified, Emergency Drugs available, Suction available, Patient being monitored and Timeout performed Patient Re-evaluated:Patient Re-evaluated prior to inductionOxygen Delivery Method: Circle system utilized Preoxygenation: Pre-oxygenation with 100% oxygen Intubation Type: IV induction Ventilation: Mask ventilation without difficulty LMA: LMA with gastric port inserted LMA Size: 5.0 Tube secured with: Tape Dental Injury: Teeth and Oropharynx as per pre-operative assessment

## 2017-02-12 NOTE — Discharge Instructions (Addendum)
Foley Catheter Care, Adult  A Foley catheter is a soft, flexible tube that is placed into the bladder to drain urine. A Foley catheter may be inserted if:  · You leak urine or are not able to control when you urinate (urinary incontinence).  · You are not able to urinate when you need to (urinary retention).  · You had prostate surgery or surgery on the genitals.  · You have certain medical conditions, such as multiple sclerosis, dementia, or a spinal cord injury.  If you are going home with a Foley catheter in place, follow the instructions below.  TAKING CARE OF THE CATHETER  1. Wash your hands with soap and water.  2. Using mild soap and warm water on a clean washcloth:  ¨ Clean the area on your body closest to the catheter insertion site using a circular motion, moving away from the catheter. Never wipe toward the catheter because this could sweep bacteria up into the urethra and cause infection.  ¨ Remove all traces of soap. Pat the area dry with a clean towel. For males, reposition the foreskin.  3. Attach the catheter to your leg so there is no tension on the catheter. Use adhesive tape or a leg strap. If you are using adhesive tape, remove any sticky residue left behind by the previous tape you used.  4. Keep the drainage bag below the level of the bladder, but keep it off the floor.  5. Check throughout the day to be sure the catheter is working and urine is draining freely. Make sure the tubing does not become kinked.  6. Do not pull on the catheter or try to remove it. Pulling could damage internal tissues.  TAKING CARE OF THE DRAINAGE BAGS  You will be given two drainage bags to take home. One is a large overnight drainage bag, and the other is a smaller leg bag that fits underneath clothing. You may wear the overnight bag at any time, but you should never wear the smaller leg bag at night. Follow the instructions below for how to empty, change, and clean your drainage bags.  Emptying the Drainage Bag   You must empty your drainage bag when it is ?-½ full or at least 2-3 times a day.  1. Wash your hands with soap and water.  2. Keep the drainage bag below your hips, below the level of your bladder. This stops urine from going back into the tubing and into your bladder.  3. Hold the dirty bag over the toilet or a clean container.  4. Open the pour spout at the bottom of the bag and empty the urine into the toilet or container. Do not let the pour spout touch the toilet, container, or any other surface. Doing so can place bacteria on the bag, which can cause an infection.  5. Clean the pour spout with a gauze pad or cotton ball that has rubbing alcohol on it.  6. Close the pour spout.  7. Attach the bag to your leg with adhesive tape or a leg strap.  8. Wash your hands well.  Changing the Drainage Bag  Change your drainage bag once a month or sooner if it starts to smell bad or look dirty. Below are steps to follow when changing the drainage bag.  1. Wash your hands with soap and water.  2. Pinch off the rubber catheter so that urine does not spill out.  3. Disconnect the catheter tube from the drainage tube   to clean the end of the drainage tube. 5. Connect the catheter tube to the drainage tube of the clean drainage bag. 6. Attach the new bag to the leg with adhesive tape or a leg strap. Avoid attaching the new bag too tightly. 7. Wash your hands well. Cleaning the Drainage Bag  1. Wash your hands with soap and water. 2. Wash the bag in warm, soapy water. 3. Rinse the bag thoroughly with warm water. 4. Fill the bag with a solution of white vinegar and water (1 cup vinegar to 1 qt warm water [.2 L vinegar to 1 L warm water]). Close the bag and soak it for 30 minutes in the solution. 5. Rinse the bag with warm water. 6. Hang the bag to dry with the pour  spout open and hanging downward. 7. Store the clean bag (once it is dry) in a clean plastic bag. 8. Wash your hands well. PREVENTING INFECTION  Wash your hands before and after handling your catheter.  Take showers daily and wash the area where the catheter enters your body. Do not take baths. Replace wet leg straps with dry ones, if this applies.  Do not use powders, sprays, or lotions on the genital area. Only use creams, lotions, or ointments as directed by your caregiver.  For females, wipe from front to back after each bowel movement.  Drink enough fluids to keep your urine clear or pale yellow unless you have a fluid restriction.  Do not let the drainage bag or tubing touch or lie on the floor.  Wear cotton underwear to absorb moisture and to keep your skin drier. SEEK MEDICAL CARE IF:   Your urine is cloudy or smells unusually bad.  Your catheter becomes clogged.  You are not draining urine into the bag or your bladder feels full.  Your catheter starts to leak. SEEK IMMEDIATE MEDICAL CARE IF:   You have pain, swelling, redness, or pus where the catheter enters the body.  You have pain in the abdomen, legs, lower back, or bladder.  You have a fever.  You see blood fill the catheter, or your urine is pink or red.  You have nausea, vomiting, or chills.  Your catheter gets pulled out. MAKE SURE YOU:   Understand these instructions.  Will watch your condition.  Will get help right away if you are not doing well or get worse.  You may remove the catheter in the morning by cutting the side arm off.   If you don't feel comfortable doing that you can come to the office in the morning to have it removed.    This information is not intended to replace advice given to you by your health care provider. Make sure you discuss any questions you have with your health care provider. Document Released: 12/10/2005 Document Revised: 04/26/2014 Document Reviewed:  11/26/2015 Elsevier Interactive Patient Education  2017 [employer].

## 2017-02-12 NOTE — Interval H&P Note (Signed)
History and Physical Interval Note:  02/12/2017 7:05 AM  David [name]  has presented today for surgery, with the diagnosis of bladder tumor  The various methods of treatment have been discussed with the patient and family. After consideration of risks, benefits and other options for treatment, the patient has consented to  Procedure(s): TRANSURETHRAL RESECTION OF BLADDER TUMOR WITH EPIRUBICIN (N/A) as a surgical intervention .  The patient's history has been reviewed, patient examined, no change in status, stable for surgery.  I have reviewed the patient's chart and labs.  Questions were answered to the patient's satisfaction.     Alianys Chacko J

## 2017-02-12 NOTE — Anesthesia Postprocedure Evaluation (Addendum)
Anesthesia Post Note  Patient: EZEKYEL LEI  Procedure(s) Performed: Procedure(s) (LRB): TRANSURETHRAL RESECTION OF BLADDER TUMOR (N/A)  Patient location during evaluation: PACU Anesthesia Type: General Level of consciousness: awake and alert Pain management: pain level controlled Vital Signs Assessment: post-procedure vital signs reviewed and stable Respiratory status: spontaneous breathing, nonlabored ventilation, respiratory function stable and patient connected to nasal cannula oxygen Cardiovascular status: blood pressure returned to baseline and stable Postop Assessment: no signs of nausea or vomiting Anesthetic complications: no       Last Vitals:  Vitals:   02/12/17 1000 02/12/17 1017  BP: (!) 141/95 (!) 157/62  Pulse: 71 71  Resp: 17 14  Temp: 36.7 C 36.7 C    Last Pain:  Vitals:   02/12/17 1026  TempSrc:   PainSc: 2                  Saul Fabiano S

## 2017-02-21 ENCOUNTER — Ambulatory Visit: Payer: Medicare Other | Admitting: Podiatry

## 2017-02-28 ENCOUNTER — Ambulatory Visit (INDEPENDENT_AMBULATORY_CARE_PROVIDER_SITE_OTHER): Payer: Medicare Other | Admitting: Podiatry

## 2017-02-28 DIAGNOSIS — B351 Tinea unguium: Secondary | ICD-10-CM

## 2017-02-28 DIAGNOSIS — M79676 Pain in unspecified toe(s): Secondary | ICD-10-CM | POA: Diagnosis not present

## 2017-02-28 DIAGNOSIS — E1151 Type 2 diabetes mellitus with diabetic peripheral angiopathy without gangrene: Secondary | ICD-10-CM

## 2017-02-28 DIAGNOSIS — L84 Corns and callosities: Secondary | ICD-10-CM

## 2017-02-28 NOTE — Progress Notes (Signed)
Patient ID: David [name], male   DOB: 03/12/1931, 83 y.o.   MRN: [id number] Complaint:  Visit Type: Patient returns to my office for continued preventative foot care services. Complaint: Patient states" my nails have grown long and thick and become painful to walk and wear shoes" Patient has been diagnosed with DM with no foot complications. The patient presents for preventative foot care services. No changes to ROS.    Podiatric Exam: Vascular: dorsalis pedis and posterior tibial pulses are palpable bilateral. Capillary return is immediate. Temperature gradient is WNL. Skin turgor WNL  Sensorium: Absent  Semmes Weinstein monofilament test. Normal tactile sensation bilaterally. Nail Exam: Pt has thick disfigured discolored nails with subungual debris noted bilateral entire nail hallux through fifth toenails Ulcer Exam: There is no evidence of ulcer or pre-ulcerative changes or infection. Orthopedic Exam: Muscle tone and strength are WNL. No limitations in general ROM. No crepitus or effusions noted. Foot type and digits show no abnormalities. Bony prominences are unremarkable. Skin:  . No infection or ulcers.  Asymptomatic porokeratosis  Diagnosis:  Onychomycosis, , Pain in right toe, pain in left toes,   Treatment & Plan Procedures and Treatment: Consent by patient was obtained for treatment procedures. The patient understood the discussion of treatment and procedures well. All questions were answered thoroughly reviewed. Debridement of mycotic and hypertrophic toenails, 1 through 5 bilateral and clearing of subungual debris. No ulceration, no infection noted.  Return Visit-Office Procedure: Patient instructed to return to the office for a follow up visit 3 months for continued evaluation and treatment.   Marializ Ferrebee DPM 

## 2017-04-08 ENCOUNTER — Encounter: Payer: Self-pay | Admitting: Internal Medicine

## 2017-05-06 DIAGNOSIS — C679 Malignant neoplasm of bladder, unspecified: Secondary | ICD-10-CM | POA: Insufficient documentation

## 2017-05-15 ENCOUNTER — Ambulatory Visit (INDEPENDENT_AMBULATORY_CARE_PROVIDER_SITE_OTHER): Payer: Medicare Other | Admitting: Podiatry

## 2017-05-15 ENCOUNTER — Encounter: Payer: Self-pay | Admitting: Podiatry

## 2017-05-15 DIAGNOSIS — M79676 Pain in unspecified toe(s): Secondary | ICD-10-CM | POA: Diagnosis not present

## 2017-05-15 DIAGNOSIS — E1151 Type 2 diabetes mellitus with diabetic peripheral angiopathy without gangrene: Secondary | ICD-10-CM

## 2017-05-15 DIAGNOSIS — B351 Tinea unguium: Secondary | ICD-10-CM | POA: Diagnosis not present

## 2017-05-15 NOTE — Progress Notes (Signed)
Patient ID: David [name], male   DOB: 03/17/1931, 83 y.o.   MRN: [id number] Complaint:  Visit Type: Patient returns to my office for continued preventative foot care services. Complaint: Patient states" my nails have grown long and thick and become painful to walk and wear shoes" Patient has been diagnosed with DM with no foot complications. The patient presents for preventative foot care services. No changes to ROS.    Podiatric Exam: Vascular: dorsalis pedis and posterior tibial pulses are palpable bilateral. Capillary return is immediate. Temperature gradient is WNL. Skin turgor WNL  Sensorium: Absent  Semmes Weinstein monofilament test. Normal tactile sensation bilaterally. Nail Exam: Pt has thick disfigured discolored nails with subungual debris noted bilateral entire nail hallux through fifth toenails Ulcer Exam: There is no evidence of ulcer or pre-ulcerative changes or infection. Orthopedic Exam: Muscle tone and strength are WNL. No limitations in general ROM. No crepitus or effusions noted. Foot type and digits show no abnormalities. Bony prominences are unremarkable. Skin:  . No infection or ulcers.  Asymptomatic porokeratosis  Diagnosis:  Onychomycosis, , Pain in right toe, pain in left toes,   Treatment & Plan Procedures and Treatment: Consent by patient was obtained for treatment procedures. The patient understood the discussion of treatment and procedures well. All questions were answered thoroughly reviewed. Debridement of mycotic and hypertrophic toenails, 1 through 5 bilateral and clearing of subungual debris. No ulceration, no infection noted.  Return Visit-Office Procedure: Patient instructed to return to the office for a follow up visit 3 months for continued evaluation and treatment.   Artavious Trebilcock DPM 

## 2017-05-27 ENCOUNTER — Encounter: Payer: Self-pay | Admitting: Physician Assistant

## 2017-05-27 NOTE — Addendum Note (Signed)
Addendum  created 05/27/17 1121 by Myrtie Soman, MD   Sign clinical note

## 2017-06-04 ENCOUNTER — Telehealth: Payer: Self-pay

## 2017-06-04 ENCOUNTER — Encounter: Payer: Self-pay | Admitting: Physician Assistant

## 2017-06-04 ENCOUNTER — Ambulatory Visit (INDEPENDENT_AMBULATORY_CARE_PROVIDER_SITE_OTHER): Payer: Medicare Other | Admitting: Physician Assistant

## 2017-06-04 VITALS — BP 110/64 | HR 66 | Ht 72.0 in | Wt 192.0 lb

## 2017-06-04 DIAGNOSIS — Z85038 Personal history of other malignant neoplasm of large intestine: Secondary | ICD-10-CM | POA: Diagnosis not present

## 2017-06-04 DIAGNOSIS — Z7901 Long term (current) use of anticoagulants: Secondary | ICD-10-CM

## 2017-06-04 DIAGNOSIS — Z01818 Encounter for other preprocedural examination: Secondary | ICD-10-CM

## 2017-06-04 DIAGNOSIS — Z86010 Personal history of colonic polyps: Secondary | ICD-10-CM | POA: Diagnosis not present

## 2017-06-04 MED ORDER — NA SULFATE-K SULFATE-MG SULF 17.5-3.13-1.6 GM/177ML PO SOLN: 1.0000 | Freq: Once | ORAL | 0 refills | Status: AC

## 2017-06-04 NOTE — Patient Instructions (Signed)
If you are age 81 or older, your body mass index should be between 23-30. Your Body mass index is 26.04 kg/m. If this is out of the aforementioned range listed, please consider follow up with your Primary Care Provider.  If you are age 51 or younger, your body mass index should be between 19-25. Your Body mass index is 26.04 kg/m. If this is out of the aformentioned range listed, please consider follow up with your Primary Care Provider.   You have been scheduled for a colonoscopy. Please follow written instructions given to you at your visit today.  Please pick up your prep supplies at the pharmacy within the next 1-3 days. If you use inhalers (even only as needed), please bring them with you on the day of your procedure. Your physician has requested that you go to www.startemmi.com and enter the access code given to you at your visit today. This web site gives a general overview about your procedure. However, you should still follow specific instructions given to you by our office regarding your preparation for the procedure.  You will be contacted by our office prior to your procedure for directions on holding your Plavix.  If you do not hear from our office 1 week prior to your scheduled procedure, please call [phone] to discuss.   Thank you for choosing me and [facility].   Ellouise Newer, PA-C

## 2017-06-04 NOTE — Progress Notes (Signed)
David [name], thanks for seeing David [name]. However, I would KEEP HIM ON PLAVIX given his history of stroke. We can manage most polyps on Plavix for high risk patients like this. Please make sure this information is clearly communicated with the patient. Thank you

## 2017-06-04 NOTE — Telephone Encounter (Signed)
Patient advised per Dr. Henrene Pastor to stay on Plavix.

## 2017-06-04 NOTE — Telephone Encounter (Signed)
  [facility] [address] [place], [place]  [address] Phone:  [phone]   Fax:  [phone]  06/04/2017   RE:      David [name] DOB:   Feb 04, 1931 MRN:   [id number]   Dear Dr. Joylene Draft,    We have scheduled the above patient for an endoscopic procedure. Our records show that he is on anticoagulation therapy.   Please advise as to how long the patient may come off his therapy of Plavix prior to the colonoscopy procedure, which is scheduled for 08/22/17.  Please fax back/ or route the completed form to [facility] [place] at [phone].   Sincerely,    Thurmon Fair, RMA

## 2017-06-04 NOTE — Telephone Encounter (Signed)
-----   Message from Jeoffrey Massed, RN sent at 06/04/2017  1:14 PM EDT ----- Regarding: FW: please call   ----- Message ----- From: Levin Erp, PA Sent: 06/04/2017  12:48 PM To: Jeoffrey Massed, RN Subject: please call                                    Dr. Henrene Pastor reviewed this note and would like patient to remain on Plavix for his colonoscopy due to his high risk for stroke- thank you-JLL ----- Message ----- From: Irene Shipper, MD Sent: 06/04/2017  11:46 AM To: Levin Erp, PA    ----- Message ----- From: Levin Erp, PA Sent: 06/04/2017  11:32 AM To: Irene Shipper, MD

## 2017-06-04 NOTE — Progress Notes (Signed)
Chief Complaint: History of colon cancer, history of adenomatous polyps, consult for screening colonoscopy on chronic anticoagulation  HPI:    David [name] is an 83 year old Caucasian male with a past medical history of abdominal aortic aneurysm, CK D stage III, COPD, gastroesophageal reflux disease, heart murmur (echo 06/14/12 with LVEF 60-65%), colon cancer, adenomatous polyps, stroke maintained on Plavix and others listed below, who regularly follows with Dr. Henrene Pastor, and presents to clinic today to discuss a surveillance colonoscopy due to his history of colon cancer and adenomatous polyps.      Per chart review patient's last colonoscopy was completed 05/28/12 by Dr. Henrene Pastor with finding of 2 polyps in the colon, prior segmental colectomy in the sigmoid colon and otherwise normal colonoscopy. Pathology returned with tubular adenomas. Repeat was recommended 5 years.   Today, the patient presents to clinic accompanied by his wife who does assist with his history. He explains that he has been well over the past 3 years, no recent strokes or heart attacks. He tells me that he has had a new finding of some cancer cells in his bladder which were removed and he is going for follow-up tomorrow. Overall, he remains in good health and would like to pursue his surveillance colonoscopy.   Patient denies fever, chills, blood in the stool, melena, weight loss, fatigue, anorexia, nausea, vomiting, heartburn, reflux, abdominal pain or change in bowel habits.  Past Medical History:  Diagnosis Date  . AAA (abdominal aortic aneurysm) ([place])   . AAA (abdominal aortic aneurysm) ([place])   . AAA (abdominal aortic aneurysm) ([place])   . Atherosclerosis    pt denies this  . BPH (benign prostatic hyperplasia)   . CKD (chronic kidney disease), stage III    family reports caused by scans but that has resolved since CT scans have stopped  . Colon cancer ([place])   . COPD (chronic obstructive pulmonary disease) ([place])   . Diabetes  mellitus   . ED (erectile dysfunction)   . Gastroesophageal reflux disease   . Heart murmur   . Hyperlipidemia   . Hypertension   . Lung nodules   . Neuromuscular disorder ([facility])    Neuropathy feet  . Osteopenia   . Peripheral vascular disease ([place])   . Pneumonia June 2013  . Positional vertigo    intermittent   . Shortness of breath    chronic per patient  . Stroke ([place])    x2 in 2006  . Stroke ([place])   . Thyroid cancer ([place])   . Thyroid disease   . Type 2 diabetes mellitus ([place])   . Vitamin D deficiency     Past Surgical History:  Procedure Laterality Date  . ABDOMINAL AORTIC ANEURYSM REPAIR N/A 11/08/2014   Procedure: ANEURYSM ABDOMINAL AORTIC REPAIR;  Surgeon: Rosetta Posner, MD;  Location: [facility];  Service: Vascular;  Laterality: N/A;  . ABDOMINAL AORTIC ANEURYSM REPAIR    . COLON RESECTION  2007  . COLON SURGERY  2006   Resection  . SEPTOPLASTY    . THYROIDECTOMY  2007  . TRANSURETHRAL RESECTION OF BLADDER TUMOR WITH MITOMYCIN-C N/A 02/12/2017   Procedure: TRANSURETHRAL RESECTION OF BLADDER TUMOR;  Surgeon: Irine Seal, MD;  Location: [facility];  Service: Urology;  Laterality: N/A;    Current Outpatient Prescriptions  Medication Sig Dispense Refill  . acetaminophen (TYLENOL) 500 MG tablet Take 500-1,000 mg by mouth at bedtime. Take 500 mg daily at bedtime and may take an additional 500mg  tablet if needed for pain    .  clopidogrel (PLAVIX) 75 MG tablet Take 75 mg by mouth at bedtime.     . Fluticasone-Salmeterol (ADVAIR) 250-50 MCG/DOSE AEPB Inhale 1 puff into the lungs every 12 (twelve) hours.    . insulin lispro (HUMALOG KWIKPEN) 100 UNIT/ML KiwkPen Inject 3 Units into the skin 2 (two) times daily. Before breakfast & before supper    . Ipratropium-Albuterol (COMBIVENT IN) Inhale 1 puff into the lungs 2 (two) times daily.     Marland Kitchen ipratropium-albuterol (DUONEB) 0.5-2.5 (3) MG/3ML SOLN Take 3 mLs by nebulization every 12 (twelve) hours as needed (shortness of  breath/cough/wheezing).     Ernest Mallick FLEXTOUCH 100 UNIT/ML Pen Inject 6 Units into the skin at bedtime.     Marland Kitchen levothyroxine (SYNTHROID, LEVOTHROID) 125 MCG tablet Take 125 mcg by mouth daily before breakfast.     . losartan-hydrochlorothiazide (HYZAAR) 100-25 MG per tablet Take 0.5 tablets by mouth daily.     . mirtazapine (REMERON SOL-TAB) 15 MG disintegrating tablet Take 15 mg by mouth at bedtime.    Marland Kitchen omeprazole (PRILOSEC) 20 MG capsule Take 20 mg by mouth daily as needed (for acid reflux/heartburn).     . rosuvastatin (CRESTOR) 20 MG tablet Take 10 mg by mouth at bedtime.     . traMADol-acetaminophen (ULTRACET) 37.5-325 MG tablet Take 1 tablet by mouth every 6 (six) hours as needed for moderate pain. 6 tablet 0  . triamcinolone cream (KENALOG) 0.1 % Apply 1 application topically 2 (two) times daily as needed (for rash/skin irritation.).     Marland Kitchen Vitamin D, Ergocalciferol, (DRISDOL) 50000 UNITS CAPS Take 50,000 Units by mouth every Monday.      No current facility-administered medications for this visit.     Allergies as of 06/04/2017 - Review Complete 06/04/2017  Allergen Reaction Noted  . Azithromycin Rash 02/01/2017  . Doxycycline Rash 06/13/2015    Family History  Problem Relation Age of Onset  . Stomach cancer Mother   . Heart failure Mother   . Breast cancer Mother   . Pneumonia Father   . Diabetes Brother   . Heart disease Brother        Heart Disease before age 36,  AAA and  Carotid  . Heart attack Brother   . Diabetes Paternal Uncle     Social History   Social History  . Marital status: Married    Spouse name: N/A  . Number of children: N/A  . Years of education: N/A   Occupational History  . Retired Retired   Social History Main Topics  . Smoking status: Former Smoker    Packs/day: 1.50    Years: 50.00    Types: Cigarettes    Quit date: 05/28/2005  . Smokeless tobacco: Never Used  . Alcohol use No  . Drug use: No  . Sexual activity: Not Currently    Other Topics Concern  . Not on file   Social History Narrative  . No narrative on file    Review of Systems:    Constitutional: No weight loss, fever or chills Skin: No rash  Cardiovascular: No chest pain  Respiratory: Chronic shortness of breath for COPD Gastrointestinal: See HPI and otherwise negative Genitourinary: No dysuria  Neurological: No headache Musculoskeletal: No new muscle or joint pain Hematologic: No bleeding or bruising Psychiatric: No history of depression or anxiety   Physical Exam:  Vital signs: BP 110/64   Pulse 66   Ht 6' (1.829 m)   Wt 192 lb (87.1 kg)   BMI 26.04 kg/m  Constitutional:   Pleasant elderly Caucasian male appears to be in NAD, Well developed, Well nourished, alert and cooperative Head:  Normocephalic and atraumatic. Eyes:   PEERL, EOMI. No icterus. Conjunctiva pink. Ears:  Normal auditory acuity. Neck:  Supple Throat: Oral cavity and pharynx without inflammation, swelling or lesion.  Respiratory: Respirations even and unlabored. Lungs clear to auscultation bilaterally.   No wheezes, crackles, or rhonchi.  Cardiovascular: Normal S1, S2. No MRG. Regular rate and rhythm. No peripheral edema, cyanosis or pallor.  Gastrointestinal:  Soft, nondistended, nontender. No rebound or guarding. Normal bowel sounds. No appreciable masses or hepatomegaly. Rectal:  Not performed.  Msk:  Symmetrical without gross deformities. Without edema, no deformity or joint abnormality.  Neurologic:  Alert and  oriented x4;  grossly normal neurologically.  Skin:   Dry and intact without significant lesions or rashes. Psychiatric: Demonstrates good judgement and reason without abnormal affect or behaviors.  MOST RECENT LABS AND IMAGING: CBC    Component Value Date/Time   WBC 11.7 (H) 06/24/2015 0507   RBC 3.97 (L) 06/24/2015 0507   HGB 12.7 (L) 06/24/2015 0507   HGB 14.1 01/17/2011 0844   HCT 38.2 (L) 06/24/2015 0507   HCT 40.6 01/17/2011 0844   PLT 122  (L) 06/24/2015 0507   PLT 152 01/17/2011 0844   MCV 96.2 06/24/2015 0507   MCV 95.3 01/17/2011 0844   MCH 32.0 06/24/2015 0507   MCHC 33.2 06/24/2015 0507   RDW 14.0 06/24/2015 0507   RDW 13.6 01/17/2011 0844   LYMPHSABS 0.9 01/17/2011 0844   MONOABS 0.7 01/17/2011 0844   EOSABS 0.1 01/17/2011 0844   BASOSABS 0.0 01/17/2011 0844    CMP     Component Value Date/Time   NA 138 02/04/2017 1356   K 4.5 02/04/2017 1356   CL 105 02/04/2017 1356   CO2 26 02/04/2017 1356   GLUCOSE 208 (H) 02/04/2017 1356   BUN 33 (H) 02/04/2017 1356   CREATININE 1.66 (H) 02/04/2017 1356   CREATININE 0.84 09/15/2014 1400   CALCIUM 9.3 02/04/2017 1356   PROT 6.3 (L) 06/24/2015 0507   ALBUMIN 3.7 06/24/2015 0507   AST 15 06/24/2015 0507   ALT 15 (L) 06/24/2015 0507   ALKPHOS 42 06/24/2015 0507   BILITOT 1.3 (H) 06/24/2015 0507   GFRNONAA 37 (L) 02/04/2017 1356   GFRAA 42 (L) 02/04/2017 1356    Assessment: 1. Surveillance colonoscopy due to personal history of colon cancer and adenomatous polyps: Last colonoscopy 05/28/12 with finding of 2 tubular adenomas, patient has personal history of distal sigmoid colon cancer and is status post resection in 2007, it was recommended he have repeat in 5 years, he is now due 2. Chronic anticoagulation: With Plavix for history of stroke  Plan: 1. Patient scheduled for surveillance colonoscopy with Dr. Henrene Pastor in the [facility]. Discussed risks, benefits, limitations and alternatives to patient agrees to proceed. He is deemed high risk for this procedure due to his history of stroke, abdominal aneurysm and COPD as well as chronic anticoagulation with Plavix. 2. Patient was advised to hold his Plavix for 5 days prior to his procedure. We will communicate with Dr. Joylene Draft to ensure that holding his Plavix is acceptable. 3. Patient to follow in clinic per recommendations from Dr. Henrene Pastor after time of colonoscopy.  Ellouise Newer, PA-C [facility] 06/04/2017, 10:46  AM  Cc: [name], MD

## 2017-06-06 ENCOUNTER — Encounter: Payer: Self-pay | Admitting: Internal Medicine

## 2017-07-23 ENCOUNTER — Emergency Department (HOSPITAL_COMMUNITY): Payer: Medicare Other

## 2017-07-23 ENCOUNTER — Inpatient Hospital Stay (HOSPITAL_COMMUNITY)
Admission: EM | Admit: 2017-07-23 | Discharge: 2017-07-25 | DRG: 194 | Disposition: A | Discharge status: Home / Self Care | Payer: Medicare Other | Attending: Internal Medicine | Admitting: Internal Medicine

## 2017-07-23 ENCOUNTER — Encounter (HOSPITAL_COMMUNITY): Payer: Self-pay | Admitting: Internal Medicine

## 2017-07-23 DIAGNOSIS — I5032 Chronic diastolic (congestive) heart failure: Secondary | ICD-10-CM | POA: Diagnosis present

## 2017-07-23 DIAGNOSIS — Z794 Long term (current) use of insulin: Secondary | ICD-10-CM | POA: Diagnosis not present

## 2017-07-23 DIAGNOSIS — E559 Vitamin D deficiency, unspecified: Secondary | ICD-10-CM | POA: Diagnosis present

## 2017-07-23 DIAGNOSIS — N529 Male erectile dysfunction, unspecified: Secondary | ICD-10-CM | POA: Diagnosis present

## 2017-07-23 DIAGNOSIS — I130 Hypertensive heart and chronic kidney disease with heart failure and stage 1 through stage 4 chronic kidney disease, or unspecified chronic kidney disease: Secondary | ICD-10-CM | POA: Diagnosis present

## 2017-07-23 DIAGNOSIS — Z881 Allergy status to other antibiotic agents status: Secondary | ICD-10-CM

## 2017-07-23 DIAGNOSIS — J181 Lobar pneumonia, unspecified organism: Principal | ICD-10-CM

## 2017-07-23 DIAGNOSIS — Z8249 Family history of ischemic heart disease and other diseases of the circulatory system: Secondary | ICD-10-CM

## 2017-07-23 DIAGNOSIS — Z8551 Personal history of malignant neoplasm of bladder: Secondary | ICD-10-CM

## 2017-07-23 DIAGNOSIS — N183 Chronic kidney disease, stage 3 unspecified: Secondary | ICD-10-CM | POA: Diagnosis present

## 2017-07-23 DIAGNOSIS — E1122 Type 2 diabetes mellitus with diabetic chronic kidney disease: Secondary | ICD-10-CM

## 2017-07-23 DIAGNOSIS — R319 Hematuria, unspecified: Secondary | ICD-10-CM | POA: Diagnosis present

## 2017-07-23 DIAGNOSIS — I10 Essential (primary) hypertension: Secondary | ICD-10-CM | POA: Diagnosis present

## 2017-07-23 DIAGNOSIS — R042 Hemoptysis: Secondary | ICD-10-CM | POA: Diagnosis present

## 2017-07-23 DIAGNOSIS — E1151 Type 2 diabetes mellitus with diabetic peripheral angiopathy without gangrene: Secondary | ICD-10-CM | POA: Diagnosis present

## 2017-07-23 DIAGNOSIS — Z8673 Personal history of transient ischemic attack (TIA), and cerebral infarction without residual deficits: Secondary | ICD-10-CM

## 2017-07-23 DIAGNOSIS — Z7902 Long term (current) use of antithrombotics/antiplatelets: Secondary | ICD-10-CM

## 2017-07-23 DIAGNOSIS — E785 Hyperlipidemia, unspecified: Secondary | ICD-10-CM | POA: Diagnosis present

## 2017-07-23 DIAGNOSIS — Z87891 Personal history of nicotine dependence: Secondary | ICD-10-CM

## 2017-07-23 DIAGNOSIS — Z833 Family history of diabetes mellitus: Secondary | ICD-10-CM

## 2017-07-23 DIAGNOSIS — E890 Postprocedural hypothyroidism: Secondary | ICD-10-CM | POA: Diagnosis present

## 2017-07-23 DIAGNOSIS — J189 Pneumonia, unspecified organism: Secondary | ICD-10-CM

## 2017-07-23 DIAGNOSIS — N400 Enlarged prostate without lower urinary tract symptoms: Secondary | ICD-10-CM | POA: Diagnosis present

## 2017-07-23 DIAGNOSIS — Z79899 Other long term (current) drug therapy: Secondary | ICD-10-CM

## 2017-07-23 DIAGNOSIS — Z85038 Personal history of other malignant neoplasm of large intestine: Secondary | ICD-10-CM

## 2017-07-23 DIAGNOSIS — K219 Gastro-esophageal reflux disease without esophagitis: Secondary | ICD-10-CM | POA: Diagnosis present

## 2017-07-23 LAB — COMPREHENSIVE METABOLIC PANEL
ALK PHOS: 82 U/L (ref 38–126)
ALT: 32 U/L (ref 17–63)
ANION GAP: 12 (ref 5–15)
AST: 23 U/L (ref 15–41)
Albumin: 4.4 g/dL (ref 3.5–5.0)
BILIRUBIN TOTAL: 0.7 mg/dL (ref 0.3–1.2)
BUN: 36 mg/dL — ABNORMAL HIGH (ref 6–20)
CO2: 20 mmol/L — ABNORMAL LOW (ref 22–32)
CREATININE: 1.56 mg/dL — AB (ref 0.61–1.24)
Calcium: 9.2 mg/dL (ref 8.9–10.3)
Chloride: 108 mmol/L (ref 101–111)
GFR calc non Af Amer: 39 mL/min — ABNORMAL LOW (ref >60)
GFR, EST AFRICAN AMERICAN: 46 mL/min — AB (ref >60)
GLUCOSE: 247 mg/dL — AB (ref 65–99)
Potassium: 4.3 mmol/L (ref 3.5–5.1)
Sodium: 140 mmol/L (ref 135–145)
TOTAL PROTEIN: 7.1 g/dL (ref 6.5–8.1)

## 2017-07-23 LAB — URINALYSIS, ROUTINE W REFLEX MICROSCOPIC
BILIRUBIN URINE: NEGATIVE
Bacteria, UA: NONE SEEN
Glucose, UA: NEGATIVE mg/dL
Ketones, ur: NEGATIVE mg/dL
NITRITE: NEGATIVE
PH: 5 (ref 5.0–8.0)
Protein, ur: NEGATIVE mg/dL
SQUAMOUS EPITHELIAL / LPF: NONE SEEN
Specific Gravity, Urine: 1.012 (ref 1.005–1.030)

## 2017-07-23 LAB — GLUCOSE, CAPILLARY
Glucose-Capillary: 163 mg/dL — ABNORMAL HIGH (ref 65–99)
Glucose-Capillary: 186 mg/dL — ABNORMAL HIGH (ref 65–99)
Glucose-Capillary: 177 mg/dL — ABNORMAL HIGH (ref 65–99)

## 2017-07-23 LAB — EXPECTORATED SPUTUM ASSESSMENT W REFEX TO RESP CULTURE

## 2017-07-23 LAB — CBC
HCT: 40.2 % (ref 39.0–52.0)
HEMOGLOBIN: 14.2 g/dL (ref 13.0–17.0)
MCH: 32.5 pg (ref 26.0–34.0)
MCHC: 35.3 g/dL (ref 30.0–36.0)
MCV: 92 fL (ref 78.0–100.0)
PLATELETS: 176 10*3/uL (ref 150–400)
RBC: 4.37 MIL/uL (ref 4.22–5.81)
RDW: 13.7 % (ref 11.5–15.5)
WBC: 7.6 10*3/uL (ref 4.0–10.5)

## 2017-07-23 LAB — TYPE AND SCREEN
ABO/RH(D): O POS
ANTIBODY SCREEN: NEGATIVE

## 2017-07-23 LAB — EXPECTORATED SPUTUM ASSESSMENT W GRAM STAIN, RFLX TO RESP C

## 2017-07-23 LAB — ABO/RH: ABO/RH(D): O POS

## 2017-07-23 LAB — STREP PNEUMONIAE URINARY ANTIGEN: STREP PNEUMO URINARY ANTIGEN: NEGATIVE

## 2017-07-23 MED ORDER — ACETAMINOPHEN 325 MG PO TABS
650.0000 mg | ORAL_TABLET | Freq: Every day | ORAL | Status: DC
  Administered 2017-07-23 – 2017-07-24 (×2): 650 mg via ORAL
  Filled 2017-07-23 (×2): qty 2

## 2017-07-23 MED ORDER — LEVOTHYROXINE SODIUM 25 MCG PO TABS
125.0000 ug | ORAL_TABLET | Freq: Every day | ORAL | Status: DC
  Administered 2017-07-23 – 2017-07-25 (×3): 125 ug via ORAL
  Filled 2017-07-23 (×3): qty 1

## 2017-07-23 MED ORDER — IPRATROPIUM-ALBUTEROL 0.5-2.5 (3) MG/3ML IN SOLN
3.0000 mL | Freq: Once | RESPIRATORY_TRACT | Status: AC
  Administered 2017-07-23: 3 mL via RESPIRATORY_TRACT
  Filled 2017-07-23: qty 3

## 2017-07-23 MED ORDER — ONDANSETRON HCL 4 MG PO TABS: 4.0000 mg | ORAL_TABLET | Freq: Four times a day (QID) | ORAL | Status: DC | PRN

## 2017-07-23 MED ORDER — ALBUTEROL SULFATE (2.5 MG/3ML) 0.083% IN NEBU
5.0000 mg | INHALATION_SOLUTION | Freq: Once | RESPIRATORY_TRACT | Status: AC
  Administered 2017-07-23: 5 mg via RESPIRATORY_TRACT
  Filled 2017-07-23: qty 6

## 2017-07-23 MED ORDER — INSULIN ASPART 100 UNIT/ML ~~LOC~~ SOLN
3.0000 [IU] | Freq: Three times a day (TID) | SUBCUTANEOUS | Status: DC
  Administered 2017-07-23 – 2017-07-25 (×6): 3 [IU] via SUBCUTANEOUS

## 2017-07-23 MED ORDER — LEVOFLOXACIN IN D5W 500 MG/100ML IV SOLN
500.0000 mg | Freq: Once | INTRAVENOUS | Status: AC
  Administered 2017-07-23: 500 mg via INTRAVENOUS
  Filled 2017-07-23: qty 100

## 2017-07-23 MED ORDER — ALBUTEROL SULFATE (2.5 MG/3ML) 0.083% IN NEBU
2.5000 mg | INHALATION_SOLUTION | Freq: Three times a day (TID) | RESPIRATORY_TRACT | Status: DC
  Administered 2017-07-24 – 2017-07-25 (×3): 2.5 mg via RESPIRATORY_TRACT
  Filled 2017-07-23 (×3): qty 3

## 2017-07-23 MED ORDER — SODIUM CHLORIDE 0.9 % IV BOLUS (SEPSIS)
1000.0000 mL | Freq: Once | INTRAVENOUS | Status: AC
  Administered 2017-07-23: 1000 mL via INTRAVENOUS

## 2017-07-23 MED ORDER — VITAMIN D (ERGOCALCIFEROL) 1.25 MG (50000 UNIT) PO CAPS: 50000.0000 [IU] | ORAL_CAPSULE | ORAL | Status: DC

## 2017-07-23 MED ORDER — LOSARTAN POTASSIUM-HCTZ 100-25 MG PO TABS: 0.5000 | ORAL_TABLET | Freq: Every day | ORAL | Status: DC

## 2017-07-23 MED ORDER — MOMETASONE FURO-FORMOTEROL FUM 200-5 MCG/ACT IN AERO
2.0000 | INHALATION_SPRAY | Freq: Two times a day (BID) | RESPIRATORY_TRACT | Status: DC
  Administered 2017-07-23 – 2017-07-25 (×4): 2 via RESPIRATORY_TRACT
  Filled 2017-07-23: qty 8.8

## 2017-07-23 MED ORDER — ALBUTEROL SULFATE (2.5 MG/3ML) 0.083% IN NEBU
2.5000 mg | INHALATION_SOLUTION | Freq: Four times a day (QID) | RESPIRATORY_TRACT | Status: DC
  Administered 2017-07-23 (×2): 2.5 mg via RESPIRATORY_TRACT
  Filled 2017-07-23 (×2): qty 3

## 2017-07-23 MED ORDER — SODIUM CHLORIDE 0.9 % IV SOLN: 250.0000 mL | INTRAVENOUS | Status: DC | PRN

## 2017-07-23 MED ORDER — LOSARTAN POTASSIUM 50 MG PO TABS
50.0000 mg | ORAL_TABLET | Freq: Every day | ORAL | Status: DC
  Administered 2017-07-23 – 2017-07-24 (×2): 50 mg via ORAL
  Filled 2017-07-23 (×2): qty 1

## 2017-07-23 MED ORDER — MIRTAZAPINE 15 MG PO TBDP
15.0000 mg | ORAL_TABLET | Freq: Every day | ORAL | Status: DC
  Administered 2017-07-23 – 2017-07-24 (×2): 15 mg via ORAL
  Filled 2017-07-23 (×2): qty 1

## 2017-07-23 MED ORDER — ROSUVASTATIN CALCIUM 10 MG PO TABS
10.0000 mg | ORAL_TABLET | Freq: Every day | ORAL | Status: DC
  Administered 2017-07-23 – 2017-07-24 (×2): 10 mg via ORAL
  Filled 2017-07-23 (×2): qty 1

## 2017-07-23 MED ORDER — CLOPIDOGREL BISULFATE 75 MG PO TABS
75.0000 mg | ORAL_TABLET | Freq: Every day | ORAL | Status: DC
  Administered 2017-07-23: 75 mg via ORAL
  Filled 2017-07-23: qty 1

## 2017-07-23 MED ORDER — INSULIN DETEMIR 100 UNIT/ML ~~LOC~~ SOLN
3.0000 [IU] | Freq: Every day | SUBCUTANEOUS | Status: DC
  Administered 2017-07-23 – 2017-07-24 (×2): 3 [IU] via SUBCUTANEOUS
  Filled 2017-07-23 (×2): qty 0.03

## 2017-07-23 MED ORDER — INSULIN ASPART 100 UNIT/ML ~~LOC~~ SOLN
0.0000 [IU] | Freq: Three times a day (TID) | SUBCUTANEOUS | Status: DC
  Administered 2017-07-23 – 2017-07-24 (×3): 2 [IU] via SUBCUTANEOUS
  Administered 2017-07-24: 7 [IU] via SUBCUTANEOUS
  Administered 2017-07-25: 3 [IU] via SUBCUTANEOUS
  Administered 2017-07-25: 2 [IU] via SUBCUTANEOUS

## 2017-07-23 MED ORDER — SODIUM CHLORIDE 0.9% FLUSH: 3.0000 mL | INTRAVENOUS | Status: DC | PRN

## 2017-07-23 MED ORDER — IPRATROPIUM-ALBUTEROL 0.5-2.5 (3) MG/3ML IN SOLN
3.0000 mL | Freq: Two times a day (BID) | RESPIRATORY_TRACT | Status: DC | PRN
Start: 2017-07-23 — End: 2017-07-25
  Administered 2017-07-24: 3 mL via RESPIRATORY_TRACT

## 2017-07-23 MED ORDER — HYDROCHLOROTHIAZIDE 12.5 MG PO CAPS
12.5000 mg | ORAL_CAPSULE | Freq: Every day | ORAL | Status: DC
  Administered 2017-07-23: 12.5 mg via ORAL
  Filled 2017-07-23: qty 1

## 2017-07-23 MED ORDER — PREDNISONE 20 MG PO TABS
40.0000 mg | ORAL_TABLET | Freq: Every day | ORAL | Status: DC
  Administered 2017-07-24 – 2017-07-25 (×2): 40 mg via ORAL
  Filled 2017-07-23 (×2): qty 2

## 2017-07-23 MED ORDER — POLYETHYLENE GLYCOL 3350 17 G PO PACK: 17.0000 g | PACK | Freq: Every day | ORAL | Status: DC | PRN

## 2017-07-23 MED ORDER — LEVOFLOXACIN IN D5W 750 MG/150ML IV SOLN
750.0000 mg | INTRAVENOUS | Status: DC
  Administered 2017-07-24: 750 mg via INTRAVENOUS
  Filled 2017-07-23: qty 150

## 2017-07-23 MED ORDER — ONDANSETRON HCL 4 MG/2ML IJ SOLN: 4.0000 mg | Freq: Four times a day (QID) | INTRAMUSCULAR | Status: DC | PRN

## 2017-07-23 MED ORDER — SODIUM CHLORIDE 0.9% FLUSH
3.0000 mL | Freq: Two times a day (BID) | INTRAVENOUS | Status: DC
  Administered 2017-07-23 – 2017-07-24 (×3): 3 mL via INTRAVENOUS

## 2017-07-23 MED ORDER — ALBUTEROL SULFATE (2.5 MG/3ML) 0.083% IN NEBU
2.5000 mg | INHALATION_SOLUTION | RESPIRATORY_TRACT | Status: DC | PRN
Start: 2017-07-23 — End: 2017-07-25

## 2017-07-23 NOTE — ED Notes (Signed)
RT called to start neb treatment.

## 2017-07-23 NOTE — ED Notes (Signed)
Report called to Kim RN. 

## 2017-07-23 NOTE — ED Notes (Signed)
Call report to [place]

## 2017-07-23 NOTE — H&P (Addendum)
History and Physical    David [name]:[id number] DOB: [birth year] DOA: 07/23/2017  PCP: [name], MD  Patient coming from: home  I have personally briefly reviewed patient's old medical records in [place]  Chief Complaint: hemoptysis  HPI: David [name] is a 83 y.o. male with medical history significant of past medical history of essential hypertension, colon cancer and bladder cancer diabetes mellitus and CVA without residual, who saw his primary care doctor 6 days prior to admission for cough, was started on a course of redness around, he relates he did not get better he continued to be short of breath with ambulation, and a persistent cough. The day prior to admission he started having hemoptysis with progressive shortness of breath. He denies any fevers, chills, vomiting, sick contacts or any recent travel history. He is having about 2 teaspoons of blood on and off when he coughs. This made him nervous and he decided to come to the ED. His wife relates that the previous 2 times he's had hemoptysis with pneumonia he's unable to move or ambulate. She relates that today he could barely it out of the bed to come into the ED. He relates some nausea but no vomiting, his wife relate he is anorexic for the last 2 days.  ED Course:  He has been afebrile with no leukocytosis with a creatinine of 1.5 just his baseline, blood glucose of 247, and a chest x-ray as below.  Review of Systems: As per HPI otherwise 10 point review of systems negative.    Past Medical History:  Diagnosis Date  . AAA (abdominal aortic aneurysm) ([place])   . AAA (abdominal aortic aneurysm) ([place])   . AAA (abdominal aortic aneurysm) ([place])   . Atherosclerosis    pt denies this  . BPH (benign prostatic hyperplasia)   . CKD (chronic kidney disease), stage III    family reports caused by scans but that has resolved since CT scans have stopped  . Colon cancer ([place])   . COPD (chronic obstructive  pulmonary disease) ([place])   . Diabetes mellitus   . ED (erectile dysfunction)   . Gastroesophageal reflux disease   . Heart murmur   . Hyperlipidemia   . Hypertension   . Lung nodules   . Neuromuscular disorder (HCC)    Neuropathy feet  . Osteopenia   . Peripheral vascular disease ([place])   . Pneumonia June 2013  . Positional vertigo    intermittent   . Shortness of breath    chronic per patient  . Stroke ([place])    x2 in 2006  . Stroke ([place])   . Thyroid cancer ([place])   . Thyroid disease   . Type 2 diabetes mellitus ([place])   . Vitamin D deficiency     Past Surgical History:  Procedure Laterality Date  . ABDOMINAL AORTIC ANEURYSM REPAIR N/A 11/08/2014   Procedure: ANEURYSM ABDOMINAL AORTIC REPAIR;  Surgeon: Rosetta Posner, MD;  Location: [facility];  Service: Vascular;  Laterality: N/A;  . ABDOMINAL AORTIC ANEURYSM REPAIR    . COLON RESECTION  2007  . COLON SURGERY  2006   Resection  . SEPTOPLASTY    . THYROIDECTOMY  2007  . TRANSURETHRAL RESECTION OF BLADDER TUMOR WITH MITOMYCIN-C N/A 02/12/2017   Procedure: TRANSURETHRAL RESECTION OF BLADDER TUMOR;  Surgeon: Irine Seal, MD;  Location: [facility];  Service: Urology;  Laterality: N/A;     reports that he quit smoking about 12 years ago. His smoking use  included Cigarettes. He has a 75.00 pack-year smoking history. He has never used smokeless tobacco. He reports that he does not drink alcohol or use drugs.  Allergies  Allergen Reactions  . Azithromycin Rash    zpack  . Doxycycline Rash    Family History  Problem Relation Age of Onset  . Stomach cancer Mother   . Heart failure Mother   . Breast cancer Mother   . Pneumonia Father   . Diabetes Brother   . Heart disease Brother        Heart Disease before age 79,  AAA and  Carotid  . Heart attack Brother   . Diabetes Paternal Uncle     Prior to Admission medications   Medication Sig Start Date End Date Taking? Authorizing Provider  acetaminophen (TYLENOL) 500 MG tablet Take  500-1,000 mg by mouth at bedtime. Take 500 mg daily at bedtime and may take an additional 500mg  tablet if needed for pain   Yes [provider]  clopidogrel (PLAVIX) 75 MG tablet Take 75 mg by mouth at bedtime.    Yes [provider]  Fluticasone-Salmeterol (ADVAIR) 250-50 MCG/DOSE AEPB Inhale 1 puff into the lungs every 12 (twelve) hours.   Yes [provider]  insulin lispro (HUMALOG KWIKPEN) 100 UNIT/ML KiwkPen Inject 3 Units into the skin 2 (two) times daily. Before breakfast & before supper   Yes [provider]  Ipratropium-Albuterol (COMBIVENT IN) Inhale 1 puff into the lungs 2 (two) times daily.    Yes [provider]  ipratropium-albuterol (DUONEB) 0.5-2.5 (3) MG/3ML SOLN Take 3 mLs by nebulization every 12 (twelve) hours as needed (shortness of breath/cough/wheezing).    Yes [provider]  LEVEMIR FLEXTOUCH 100 UNIT/ML Pen Inject 6 Units into the skin at bedtime.  04/05/15  Yes [provider]  levothyroxine (SYNTHROID, LEVOTHROID) 125 MCG tablet Take 125 mcg by mouth daily before breakfast.    Yes [provider]  losartan-hydrochlorothiazide (HYZAAR) 100-25 MG per tablet Take 0.5 tablets by mouth daily.    Yes [provider]  mirtazapine (REMERON SOL-TAB) 15 MG disintegrating tablet Take 15 mg by mouth at bedtime. 12/28/16  Yes [provider]  predniSONE (STERAPRED UNI-PAK 21 TAB) 5 MG (21) TBPK tablet Take 5-10 mg by mouth as directed. Day 1: 5mg  by mouth before breakfast, 5mg  by mouth after lunch, 5mg  by mouth after dinner, and 10mg  by mouth at bedtime. Day 2: 5mg  by mouth before breakfast, 5mg  by mouth after lunch, 5 mg by mouth after dinner, and 10mg  by mouth at bedtime  Day 3: 5mg  by mouth before breakfast, 5mg  by mouth after lunch, 5mg  by mouth after dinner, and 5mg  by mouth at bedtime  Day 4: 5mg  by mouth before breakfast, 5mg  by mouth after lunch, and 5mg  by mouth at bedtime  Day 5: 5mg  by  mouth before breakfast and 5mg  by mouth at bedtime  Day 6: 5mg  by mouth before breakfast 07/18/17  Yes [provider]  rosuvastatin (CRESTOR) 20 MG tablet Take 10 mg by mouth at bedtime.    Yes [provider]  triamcinolone cream (KENALOG) 0.1 % Apply 1 application topically 2 (two) times daily as needed (for rash/skin irritation.).    Yes [provider]  Vitamin D, Ergocalciferol, (DRISDOL) 50000 UNITS CAPS Take 50,000 Units by mouth every Monday.    Yes [provider]  traMADol-acetaminophen (ULTRACET) 37.5-325 MG tablet Take 1 tablet by mouth every 6 (six) hours as needed for moderate pain.  Patient not taking: Reported on 07/23/2017 02/12/17   Irine Seal, MD    Physical Exam: Vitals:   07/23/17 0619 07/23/17 0620 07/23/17 0932  BP: (!) 163/80  (!) 169/86  Pulse: 72  64  Resp: 18  18  Temp: 97.8 F (36.6 C)    TempSrc: Oral    SpO2: 96%  95%  Weight:  86.2 kg (190 lb)   Height:  6' (1.829 m)     Constitutional: NAD, calm, comfortable Vitals:   07/23/17 0619 07/23/17 0620 07/23/17 0932  BP: (!) 163/80  (!) 169/86  Pulse: 72  64  Resp: 18  18  Temp: 97.8 F (36.6 C)    TempSrc: Oral    SpO2: 96%  95%  Weight:  86.2 kg (190 lb)   Height:  6' (1.829 m)    Eyes: Pupils equally round and reactive. ENMT: Because membranes moist with normal dentition. Neck: No thyromegaly no JVD. Respiratory: Good air movement no use accessory muscles with some crackles on the upper right lung and middle right lung, and wheezing on the right lung. Cardiovascular: Regular rate and rhythm with positive S1-S2. Abdomen: Positive bowel sounds soft nontender nondistended. Musculoskeletal: No clubbing cyanosis or edema. Skin: No rashes. Neurologic: Awake, alert and oriented 3 nonfocal Psychiatric: Normal judgment and insight.   Labs on Admission: I have personally reviewed following labs and imaging studies  CBC:  Recent Labs Lab 07/23/17 0628  WBC 7.6    HGB 14.2  HCT 40.2  MCV 92.0  PLT 585   Basic Metabolic Panel:  Recent Labs Lab 07/23/17 0628  NA 140  K 4.3  CL 108  CO2 20*  GLUCOSE 247*  BUN 36*  CREATININE 1.56*  CALCIUM 9.2   GFR: Estimated Creatinine Clearance: 39.4 mL/min (A) (by C-G formula based on SCr of 1.56 mg/dL (H)). Liver Function Tests:  Recent Labs Lab 07/23/17 0628  AST 23  ALT 32  ALKPHOS 82  BILITOT 0.7  PROT 7.1  ALBUMIN 4.4   No results for input(s): LIPASE, AMYLASE in the last 168 hours. No results for input(s): AMMONIA in the last 168 hours. Coagulation Profile: No results for input(s): INR, PROTIME in the last 168 hours. Cardiac Enzymes: No results for input(s): CKTOTAL, CKMB, CKMBINDEX, TROPONINI in the last 168 hours. BNP (last 3 results) No results for input(s): PROBNP in the last 8760 hours. HbA1C: No results for input(s): HGBA1C in the last 72 hours. CBG: No results for input(s): GLUCAP in the last 168 hours. Lipid Profile: No results for input(s): CHOL, HDL, LDLCALC, TRIG, CHOLHDL, LDLDIRECT in the last 72 hours. Thyroid Function Tests: No results for input(s): TSH, T4TOTAL, FREET4, T3FREE, THYROIDAB in the last 72 hours. Anemia Panel: No results for input(s): VITAMINB12, FOLATE, FERRITIN, TIBC, IRON, RETICCTPCT in the last 72 hours. Urine analysis:    Component Value Date/Time   COLORURINE YELLOW 06/23/2015 1110   APPEARANCEUR CLEAR 06/23/2015 1110   LABSPEC 1.019 06/23/2015 1110   PHURINE 5.0 06/23/2015 1110   GLUCOSEU NEGATIVE 06/23/2015 1110   HGBUR NEGATIVE 06/23/2015 1110   BILIRUBINUR NEGATIVE 06/23/2015 1110   KETONESUR NEGATIVE 06/23/2015 1110   PROTEINUR NEGATIVE 06/23/2015 1110   UROBILINOGEN 0.2 06/23/2015 1110   NITRITE NEGATIVE 06/23/2015 1110   LEUKOCYTESUR NEGATIVE 06/23/2015 1110    Radiological Exams on Admission: Dg Chest 2 View  Result Date: 07/23/2017 CLINICAL DATA:  Chronic cough with hemoptysis EXAM: CHEST  2 VIEW COMPARISON:  June 23, 2015 FINDINGS: There is irregular opacity  in the right upper lobe near the apex. There is somewhat more ill-defined opacity elsewhere in the right upper lobe, anterior segment, more inferiorly. The lungs elsewhere are clear. Heart size and pulmonary vascularity are normal. No adenopathy. There is aortic atherosclerosis. No evident bone lesions. IMPRESSION: No active cardiopulmonary disease. Areas of opacity felt to represent pneumonia in the right upper lobe. These changes are most notable near the apex. Lungs elsewhere clear. No adenopathy evident. There is aortic atherosclerosis. Followup PA and lateral chest radiographs recommended in 3-4 weeks following trial of antibiotic therapy to ensure resolution and exclude underlying malignancy. Aortic Atherosclerosis (ICD10-I70.0). Electronically Signed   By: Lowella Grip III M.D.   On: 07/23/2017 07:25    EKG: Independently reviewed. Normal sinus rhythm normal axis no T wave abnormalities.  Assessment/Plan   Hemoptysis due to Right upper lobe pneumonia [facility]): Admit him to the hospital, will continue IV Levaquin. At this point he has no signs of SIRS, which is quite concerning. As he has probably had this for the last week. We will use Tylenol for fevers. He is mildly wheezy on the right, will start him a short course of oral steroids. Monitor her sats with vitals. He's been type and screen. PT consult for am  Essential  HTN (hypertension): Continue current home regimen.  Type 2 diabetes mellitus with diabetic chronic kidney disease ([place]) Continue home dose Levemir, we'll start sliding scale insulin.  CKD (chronic kidney disease) stage 3, GFR 30-59 ml/min His creatinine seems to be at baseline. No further workup.   DVT prophylaxis: SCD's Code Status: Full Family Communication: Wife at bedside, she is really pushy. Consults called: None Admission status: inpatient   Charlynne Cousins MD [facility] Pager [phone]  If  7PM-7AM, please contact night-coverage [email or web address] Password [email or web address]  07/23/2017, 10:18 AM

## 2017-07-23 NOTE — ED Triage Notes (Signed)
Pt reports cough x1 week, and increasing sob and recent hemoptysis. Pt reports hx of COPD w/ Emphysema. Pt in no respiratory distress at this time. Pt A+OX4, speaking in complete sentences, ambulatory independently to triage.

## 2017-07-23 NOTE — ED Notes (Signed)
ED Provider at bedside. 

## 2017-07-23 NOTE — ED Provider Notes (Signed)
[facility] DEPT Provider Note   CSN: [id number] Arrival date & time: 07/23/17  5993     History   Chief Complaint Chief Complaint  Patient presents with  . Hemoptysis    HPI David [name] is a 83 y.o. male.  HPI   83 year old male with a history of COPD, diabetes, hypertension, hyperlipidemia, CVA, peripheral vascular disease presents with concern for cough, shortness of breath and hemoptysis. Patient reports that for the past week and a half he's had increasing cough. He saw his primary care physician on Thursday who placed him on prednisone for COPD exacerbation. Reports he's been taking the prednisone with some improvement of his wheezing and dyspnea, however last night continued to have worsening cough, when he woke up this morning noted he was coughing up a large amount of yellow mucus with blood. Reports that at times it was bright red blood mixed with mucus, and other times it appeared to be more blood than mucus. He reports the amount of blood was about a quarter of a shot glass.  Reports his dyspnea improves with breathing treatments. Denies chest pain, fevers.  Family reports concerned that when he's been diagnosed with pneumonia in the past, he's crashed quickly, they report "he will be fine at 5 then nearly dieing by 7"  Past Medical History:  Diagnosis Date  . AAA (abdominal aortic aneurysm) ([place])   . AAA (abdominal aortic aneurysm) ([place])   . AAA (abdominal aortic aneurysm) ([place])   . Atherosclerosis    pt denies this  . BPH (benign prostatic hyperplasia)   . CKD (chronic kidney disease), stage III    family reports caused by scans but that has resolved since CT scans have stopped  . Colon cancer ([place])   . COPD (chronic obstructive pulmonary disease) ([place])   . Diabetes mellitus   . ED (erectile dysfunction)   . Gastroesophageal reflux disease   . Heart murmur   . Hyperlipidemia   . Hypertension   . Lung nodules   . Neuromuscular disorder (HCC)    Neuropathy feet  . Osteopenia   . Peripheral vascular disease ([place])   . Pneumonia June 2013  . Positional vertigo    intermittent   . Shortness of breath    chronic per patient  . Stroke ([place])    x2 in 2006  . Stroke ([place])   . Thyroid cancer ([place])   . Thyroid disease   . Type 2 diabetes mellitus ([place])   . Vitamin D deficiency     Patient Active Problem List   Diagnosis Date Noted  . AKI (acute kidney injury) ([place]) 06/25/2015  . Chronic diastolic heart failure ([place]) 06/24/2015  . CAP (community acquired pneumonia) 06/23/2015  . CKD (chronic kidney disease) stage 3, GFR 30-59 ml/min 06/23/2015  . Hypothyroidism 06/23/2015  . Leukocytosis 06/23/2015  . Thrombocytopenia ([place]) 06/23/2015  . Acute respiratory failure with hypoxia ([place])   . Type 2 diabetes mellitus with diabetic chronic kidney disease ([place])   . Abdominal aortic aneurysm without rupture ([place]) 11/08/2014  . HTN (hypertension) 06/14/2012  . Dyslipidemia 06/14/2012  . Cerebral artery occlusion with cerebral infarction ([place]) 01/04/2009    Past Surgical History:  Procedure Laterality Date  . ABDOMINAL AORTIC ANEURYSM REPAIR N/A 11/08/2014   Procedure: ANEURYSM ABDOMINAL AORTIC REPAIR;  Surgeon: Rosetta Posner, MD;  Location: [facility];  Service: Vascular;  Laterality: N/A;  . ABDOMINAL AORTIC ANEURYSM REPAIR    . COLON RESECTION  2007  . COLON SURGERY  2006   Resection  . SEPTOPLASTY    . THYROIDECTOMY  2007  . TRANSURETHRAL RESECTION OF BLADDER TUMOR WITH MITOMYCIN-C N/A 02/12/2017   Procedure: TRANSURETHRAL RESECTION OF BLADDER TUMOR;  Surgeon: Irine Seal, MD;  [facility];  Service: Urology;  Laterality: N/A;       Home Medications    Prior to Admission medications   Medication Sig Start Date End Date Taking? Authorizing Provider  acetaminophen (TYLENOL) 500 MG tablet Take 500-1,000 mg by mouth at bedtime. Take 500 mg daily at bedtime and may take an additional 500mg  tablet if needed for pain   Yes  [provider]  clopidogrel (PLAVIX) 75 MG tablet Take 75 mg by mouth at bedtime.    Yes [provider]  Fluticasone-Salmeterol (ADVAIR) 250-50 MCG/DOSE AEPB Inhale 1 puff into the lungs every 12 (twelve) hours.   Yes [provider]  insulin lispro (HUMALOG KWIKPEN) 100 UNIT/ML KiwkPen Inject 3 Units into the skin 2 (two) times daily. Before breakfast & before supper   Yes [provider]  Ipratropium-Albuterol (COMBIVENT IN) Inhale 1 puff into the lungs 2 (two) times daily.    Yes [provider]  ipratropium-albuterol (DUONEB) 0.5-2.5 (3) MG/3ML SOLN Take 3 mLs by nebulization every 12 (twelve) hours as needed (shortness of breath/cough/wheezing).    Yes [provider]  LEVEMIR FLEXTOUCH 100 UNIT/ML Pen Inject 6 Units into the skin at bedtime.  04/05/15  Yes [provider]  levothyroxine (SYNTHROID, LEVOTHROID) 125 MCG tablet Take 125 mcg by mouth daily before breakfast.    Yes [provider]  losartan-hydrochlorothiazide (HYZAAR) 100-25 MG per tablet Take 0.5 tablets by mouth daily.    Yes [provider]  mirtazapine (REMERON SOL-TAB) 15 MG disintegrating tablet Take 15 mg by mouth at bedtime. 12/28/16  Yes [provider]  predniSONE (STERAPRED UNI-PAK 21 TAB) 5 MG (21) TBPK tablet Take 5-10 mg by mouth as directed. Day 1: 5mg  by mouth before breakfast, 5mg  by mouth after lunch, 5mg  by mouth after dinner, and 10mg  by mouth at bedtime. Day 2: 5mg  by mouth before breakfast, 5mg  by mouth after lunch, 5 mg by mouth after dinner, and 10mg  by mouth at bedtime  Day 3: 5mg  by mouth before breakfast, 5mg  by mouth after lunch, 5mg  by mouth after dinner, and 5mg  by mouth at bedtime  Day 4: 5mg  by mouth before breakfast, 5mg  by mouth after lunch, and 5mg  by mouth at bedtime  Day 5: 5mg  by mouth before breakfast and 5mg  by mouth at bedtime  Day 6: 5mg  by mouth before breakfast 07/18/17  Yes [provider]    rosuvastatin (CRESTOR) 20 MG tablet Take 10 mg by mouth at bedtime.    Yes [provider]  triamcinolone cream (KENALOG) 0.1 % Apply 1 application topically 2 (two) times daily as needed (for rash/skin irritation.).    Yes [provider]  Vitamin D, Ergocalciferol, (DRISDOL) 50000 UNITS CAPS Take 50,000 Units by mouth every Monday.    Yes [provider]  traMADol-acetaminophen (ULTRACET) 37.5-325 MG tablet Take 1 tablet by mouth every 6 (six) hours as needed for moderate pain. Patient not taking: Reported on 07/23/2017 02/12/17   Irine Seal, MD    Family History Family History  Problem Relation Age of Onset  . Stomach cancer Mother   . Heart failure Mother   . Breast cancer Mother   . Pneumonia Father   . Diabetes Brother   . Heart disease Brother  Heart Disease before age 45,  AAA and  Carotid  . Heart attack Brother   . Diabetes Paternal Uncle     Social History Social History  Substance Use Topics  . Smoking status: Former Smoker    Packs/day: 1.50    Years: 50.00    Types: Cigarettes    Quit date: 05/28/2005  . Smokeless tobacco: Never Used  . Alcohol use No     Allergies   Azithromycin and Doxycycline   Review of Systems Review of Systems  Constitutional: Negative for fever.  HENT: Negative for sore throat.   Eyes: Negative for visual disturbance.  Respiratory: Positive for cough, shortness of breath and wheezing.   Cardiovascular: Negative for chest pain.  Gastrointestinal: Negative for abdominal pain, nausea and vomiting.  Genitourinary: Negative for difficulty urinating.  Musculoskeletal: Negative for back pain and neck stiffness.  Skin: Negative for rash.  Neurological: Negative for syncope and headaches.     Physical Exam Updated Vital Signs BP (!) 169/86 (BP Location: Left Arm)   Pulse 64   Temp 97.8 F (36.6 C) (Oral)   Resp 18   Ht 6' (1.829 m)   Wt 86.2 kg (190 lb)   SpO2 95%   BMI 25.77 kg/m   Physical  Exam  Constitutional: He is oriented to person, place, and time. He appears well-developed and well-nourished. No distress.  HENT:  Head: Normocephalic and atraumatic.  Eyes: Conjunctivae and EOM are normal.  Neck: Normal range of motion.  Cardiovascular: Normal rate, regular rhythm, normal heart sounds and intact distal pulses.  Exam reveals no gallop and no friction rub.   No murmur heard. Pulmonary/Chest: Effort normal. No respiratory distress. He has wheezes. He has rhonchi. He has no rales.  Abdominal: Soft. He exhibits no distension. There is no tenderness. There is no guarding.  Musculoskeletal: He exhibits no edema.  Neurological: He is alert and oriented to person, place, and time.  Skin: Skin is warm and dry. He is not diaphoretic.  Nursing note and vitals reviewed.    ED Treatments / Results  Labs (all labs ordered are listed, but only abnormal results are displayed) Labs Reviewed  COMPREHENSIVE METABOLIC PANEL - Abnormal; Notable for the following:       Result Value   CO2 20 (*)    Glucose, Bld 247 (*)    BUN 36 (*)    Creatinine, Ser 1.56 (*)    GFR calc non Af Amer 39 (*)    GFR calc Af Amer 46 (*)    All other components within normal limits  CULTURE, BLOOD (ROUTINE X 2)  CULTURE, BLOOD (ROUTINE X 2)  CBC  I-STAT CG4 LACTIC ACID, ED  TYPE AND SCREEN  ABO/RH    EKG  EKG Interpretation  Date/Time:  Tuesday July 23 2017 06:26:07 EDT Ventricular Rate:  67 PR Interval:    QRS Duration: 103 QT Interval:  404 QTC Calculation: 427 R Axis:   67 Text Interpretation:  Sinus rhythm Atrial premature complex Confirmed by Davonna Belling [phone]) on 07/23/2017 7:26:47 AM Also confirmed by Davonna Belling [phone]), editor Hattie Perch [phone])  on 07/23/2017 8:53:40 AM       Radiology Dg Chest 2 View  Result Date: 07/23/2017 CLINICAL DATA:  Chronic cough with hemoptysis EXAM: CHEST  2 VIEW COMPARISON:  June 23, 2015 FINDINGS: There is irregular opacity in  the right upper lobe near the apex. There is somewhat more ill-defined opacity elsewhere in the right upper lobe, anterior segment,  more inferiorly. The lungs elsewhere are clear. Heart size and pulmonary vascularity are normal. No adenopathy. There is aortic atherosclerosis. No evident bone lesions. IMPRESSION: No active cardiopulmonary disease. Areas of opacity felt to represent pneumonia in the right upper lobe. These changes are most notable near the apex. Lungs elsewhere clear. No adenopathy evident. There is aortic atherosclerosis. Followup PA and lateral chest radiographs recommended in 3-4 weeks following trial of antibiotic therapy to ensure resolution and exclude underlying malignancy. Aortic Atherosclerosis (ICD10-I70.0). Electronically Signed   By: Lowella Grip III M.D.   On: 07/23/2017 07:25    Procedures Procedures (including critical care time)  Medications Ordered in ED Medications  sodium chloride 0.9 % bolus 1,000 mL (not administered)  ipratropium-albuterol (DUONEB) 0.5-2.5 (3) MG/3ML nebulizer solution 3 mL (not administered)  levofloxacin (LEVAQUIN) IVPB 500 mg (not administered)  albuterol (PROVENTIL) (2.5 MG/3ML) 0.083% nebulizer solution 5 mg (5 mg Nebulization Given 07/23/17 0649)     Initial Impression / Assessment and Plan / ED Course  I have reviewed the triage vital signs and the nursing notes.  Pertinent labs & imaging results that were available during my care of the patient were reviewed by me and considered in my medical decision making (see chart for details).     83 year old male with a history of COPD, diabetes, hypertension, hyperlipidemia, CVA, peripheral vascular disease presents with concern for cough, shortness of breath and hemoptysis. Chest XR shows pneumonia and feel this fits with clinical history of worsening cough, however recommend outpatient repeat XR in 3-4 weeks to ensure resolution. Hemoptysis likely secondary to pneumonia and bronchitis.  Patient PSI/PORT score of 103 and family reports concern that he has declined rapidly at home in the past with pneumonia.  Ordered blood cx, levaquin given hx of allergies to azithromycin.  Will admit for further care.  Final Clinical Impressions(s) / ED Diagnoses   Final diagnoses:  Community acquired pneumonia of right upper lobe of lung ([place])    New Prescriptions New Prescriptions   No medications on file     Gareth Morgan, MD 07/23/17 1012

## 2017-07-24 ENCOUNTER — Ambulatory Visit: Payer: Medicare Other | Admitting: Podiatry

## 2017-07-24 DIAGNOSIS — R042 Hemoptysis: Secondary | ICD-10-CM | POA: Diagnosis not present

## 2017-07-24 DIAGNOSIS — J181 Lobar pneumonia, unspecified organism: Secondary | ICD-10-CM | POA: Diagnosis present

## 2017-07-24 DIAGNOSIS — Z7902 Long term (current) use of antithrombotics/antiplatelets: Secondary | ICD-10-CM | POA: Diagnosis not present

## 2017-07-24 DIAGNOSIS — Z85038 Personal history of other malignant neoplasm of large intestine: Secondary | ICD-10-CM | POA: Diagnosis not present

## 2017-07-24 DIAGNOSIS — E890 Postprocedural hypothyroidism: Secondary | ICD-10-CM | POA: Diagnosis not present

## 2017-07-24 DIAGNOSIS — E1122 Type 2 diabetes mellitus with diabetic chronic kidney disease: Secondary | ICD-10-CM | POA: Diagnosis not present

## 2017-07-24 DIAGNOSIS — I130 Hypertensive heart and chronic kidney disease with heart failure and stage 1 through stage 4 chronic kidney disease, or unspecified chronic kidney disease: Secondary | ICD-10-CM | POA: Diagnosis not present

## 2017-07-24 DIAGNOSIS — Z8673 Personal history of transient ischemic attack (TIA), and cerebral infarction without residual deficits: Secondary | ICD-10-CM | POA: Diagnosis not present

## 2017-07-24 DIAGNOSIS — E785 Hyperlipidemia, unspecified: Secondary | ICD-10-CM | POA: Diagnosis not present

## 2017-07-24 DIAGNOSIS — I5032 Chronic diastolic (congestive) heart failure: Secondary | ICD-10-CM | POA: Diagnosis not present

## 2017-07-24 DIAGNOSIS — Z794 Long term (current) use of insulin: Secondary | ICD-10-CM | POA: Diagnosis not present

## 2017-07-24 DIAGNOSIS — R319 Hematuria, unspecified: Secondary | ICD-10-CM | POA: Diagnosis not present

## 2017-07-24 DIAGNOSIS — N529 Male erectile dysfunction, unspecified: Secondary | ICD-10-CM | POA: Diagnosis not present

## 2017-07-24 DIAGNOSIS — N183 Chronic kidney disease, stage 3 (moderate): Secondary | ICD-10-CM | POA: Diagnosis not present

## 2017-07-24 DIAGNOSIS — K219 Gastro-esophageal reflux disease without esophagitis: Secondary | ICD-10-CM | POA: Diagnosis not present

## 2017-07-24 DIAGNOSIS — N400 Enlarged prostate without lower urinary tract symptoms: Secondary | ICD-10-CM | POA: Diagnosis not present

## 2017-07-24 DIAGNOSIS — E559 Vitamin D deficiency, unspecified: Secondary | ICD-10-CM | POA: Diagnosis not present

## 2017-07-24 DIAGNOSIS — E1151 Type 2 diabetes mellitus with diabetic peripheral angiopathy without gangrene: Secondary | ICD-10-CM | POA: Diagnosis not present

## 2017-07-24 LAB — GLUCOSE, CAPILLARY
GLUCOSE-CAPILLARY: 167 mg/dL — AB (ref 65–99)
Glucose-Capillary: 162 mg/dL — ABNORMAL HIGH (ref 65–99)
Glucose-Capillary: 306 mg/dL — ABNORMAL HIGH (ref 65–99)
Glucose-Capillary: 302 mg/dL — ABNORMAL HIGH (ref 65–99)

## 2017-07-24 LAB — BASIC METABOLIC PANEL
Anion gap: 10 (ref 5–15)
BUN: 31 mg/dL — ABNORMAL HIGH (ref 6–20)
CALCIUM: 8.7 mg/dL — AB (ref 8.9–10.3)
CHLORIDE: 106 mmol/L (ref 101–111)
CO2: 23 mmol/L (ref 22–32)
CREATININE: 1.58 mg/dL — AB (ref 0.61–1.24)
GFR calc non Af Amer: 39 mL/min — ABNORMAL LOW (ref >60)
GFR, EST AFRICAN AMERICAN: 45 mL/min — AB (ref >60)
Glucose, Bld: 184 mg/dL — ABNORMAL HIGH (ref 65–99)
Potassium: 3.8 mmol/L (ref 3.5–5.1)
SODIUM: 139 mmol/L (ref 135–145)

## 2017-07-24 LAB — CBC WITH DIFFERENTIAL/PLATELET
BASOS ABS: 0.1 10*3/uL (ref 0.0–0.1)
Basophils Relative: 1 %
EOS ABS: 0 10*3/uL (ref 0.0–0.7)
Eosinophils Relative: 0 %
HEMATOCRIT: 40.8 % (ref 39.0–52.0)
HEMOGLOBIN: 14.8 g/dL (ref 13.0–17.0)
LYMPHS PCT: 8 %
Lymphs Abs: 0.7 10*3/uL (ref 0.7–4.0)
MCH: 32.9 pg (ref 26.0–34.0)
MCHC: 36.3 g/dL — AB (ref 30.0–36.0)
MCV: 90.7 fL (ref 78.0–100.0)
MONOS PCT: 3 %
Monocytes Absolute: 0.3 10*3/uL (ref 0.1–1.0)
NEUTROS ABS: 8.1 10*3/uL — AB (ref 1.7–7.7)
Neutrophils Relative %: 88 %
Platelets: 172 10*3/uL (ref 150–400)
RBC: 4.5 MIL/uL (ref 4.22–5.81)
RDW: 13.7 % (ref 11.5–15.5)
WBC: 9.2 10*3/uL (ref 4.0–10.5)

## 2017-07-24 LAB — PROTIME-INR
INR: 1.14
PROTHROMBIN TIME: 14.6 s (ref 11.4–15.2)

## 2017-07-24 LAB — APTT: APTT: 35 s (ref 24–36)

## 2017-07-24 LAB — HIV ANTIBODY (ROUTINE TESTING W REFLEX): HIV Screen 4th Generation wRfx: NONREACTIVE

## 2017-07-24 MED ORDER — CLOPIDOGREL BISULFATE 75 MG PO TABS
75.0000 mg | ORAL_TABLET | Freq: Every day | ORAL | Status: DC
  Administered 2017-07-24: 75 mg via ORAL
  Filled 2017-07-24: qty 1

## 2017-07-24 NOTE — Evaluation (Signed)
Physical Therapy Evaluation Patient Details Name: David [name] MRN: [id number] DOB: [birth year] Today's Date: 07/24/2017   History of Present Illness  Pt admitted with SOB and hemoptysis and dx of CAP.  Pt wtih hx of DM, CVA, COPD and positional vertigoq  Clinical Impression  Pt admitted as above and presenting with functional mobility limitations 2* generalized weakness, ltd endurance and ambulatory balance deficits.  Pt should progress to dc home with family assist and would benefit from follow up HHPT to further address deficits.    Follow Up Recommendations Home health PT    Equipment Recommendations  None recommended by PT    Recommendations for Other Services       Precautions / Restrictions Precautions Precautions: Fall Restrictions Weight Bearing Restrictions: No      Mobility  Bed Mobility Overal bed mobility: Needs Assistance Bed Mobility: Supine to Sit;Sit to Supine     Supine to sit: Min guard Sit to supine: Min guard   General bed mobility comments: Increased time  Transfers Overall transfer level: Needs assistance Equipment used: Rolling walker (2 wheeled) Transfers: Sit to/from Stand Sit to Stand: Min assist         General transfer comment: cues for use of UEs to self assist  Ambulation/Gait Ambulation/Gait assistance: Min assist Ambulation Distance (Feet): 250 Feet Assistive device: Rolling walker (2 wheeled) Gait Pattern/deviations: Step-to pattern;Step-through pattern;Decreased step length - right;Decreased step length - left;Shuffle;Trunk flexed Gait velocity: decr Gait velocity interpretation: Below normal speed for age/gender General Gait Details: cues for posture and position from RW.  Pt relying on RW for stability and support and requiring several standing rest breaks to complete task  Stairs            Wheelchair Mobility    Modified Rankin (Stroke Patients Only)       Balance Overall balance assessment: Needs  assistance Sitting-balance support: No upper extremity supported;Feet supported Sitting balance-Leahy Scale: Good     Standing balance support: Bilateral upper extremity supported Standing balance-Leahy Scale: Poor Standing balance comment: Pt relying on RW for stability                             Pertinent Vitals/Pain Pain Assessment: No/denies pain    Home Living Family/patient expects to be discharged to:: Private residence Living Arrangements: Spouse/significant other Available Help at Discharge: Family Type of Home: House Home Access: Stairs to enter Entrance Stairs-Rails: Right Entrance Stairs-Number of Steps: 3 Home Layout: One level Home Equipment: Environmental consultant - 2 wheels;Cane - single point      Prior Function Level of Independence: Independent;Independent with assistive device(s)               Hand Dominance        Extremity/Trunk Assessment   Upper Extremity Assessment Upper Extremity Assessment: Generalized weakness    Lower Extremity Assessment Lower Extremity Assessment: Generalized weakness       Communication   Communication: No difficulties  Cognition Arousal/Alertness: Awake/alert Behavior During Therapy: WFL for tasks assessed/performed Overall Cognitive Status: Within Functional Limits for tasks assessed                                        General Comments      Exercises     Assessment/Plan    PT Assessment Patient needs continued PT services  PT Problem List  Decreased strength;Decreased range of motion;Decreased activity tolerance;Decreased balance;Decreased mobility;Decreased knowledge of use of DME;Pain       PT Treatment Interventions DME instruction;Gait training;Stair training;Functional mobility training;Therapeutic activities;Therapeutic exercise;Patient/family education    PT Goals (Current goals can be found in the Care Plan section)  Acute Rehab PT Goals Patient Stated Goal: Regain  strength and independence PT Goal Formulation: With patient Time For Goal Achievement: 08/03/17 Potential to Achieve Goals: Good    Frequency Min 3X/week   Barriers to discharge        Co-evaluation               AM-PAC PT "6 Clicks" Daily Activity  Outcome Measure Difficulty turning over in bed (including adjusting bedclothes, sheets and blankets)?: A Little Difficulty moving from lying on back to sitting on the side of the bed? : A Little Difficulty sitting down on and standing up from a chair with arms (e.g., wheelchair, bedside commode, etc,.)?: A Little Help needed moving to and from a bed to chair (including a wheelchair)?: A Little Help needed walking in hospital room?: A Little Help needed climbing 3-5 steps with a railing? : A Little 6 Click Score: 18    End of Session Equipment Utilized During Treatment: Gait belt Activity Tolerance: Patient tolerated treatment well;Patient limited by fatigue Patient left: in bed;with call bell/phone within reach;with family/visitor present Nurse Communication: Mobility status PT Visit Diagnosis: Unsteadiness on feet (R26.81);Muscle weakness (generalized) (M62.81);Difficulty in walking, not elsewhere classified (R26.2)    Time: 1224-8250 PT Time Calculation (min) (ACUTE ONLY): 19 min   Charges:   PT Evaluation $PT Eval Low Complexity: 1 Low     PT G Codes:        Pg [phone]   Brad Mcgaughy 07/24/2017, 12:46 PM

## 2017-07-24 NOTE — Progress Notes (Signed)
[facility] Progress Note  Patient: David [name]:[id number]   PCP: [name], MD DOB: 08-12-1931   DOA: 07/23/2017   DOS: 07/24/2017   Date of Service: the patient was seen and examined on 07/24/2017  Subjective: Complains about increased shortness of breath, cough is still present but hemoptysis has been changed from blood with clot to pink frothy sputum. Also noted some blood at the meatus yesterday currently resolved. No diarrhea no active bleeding in the stool. No abdominal pain nausea or vomiting  Brief hospital course: Pt. with PMH of HTN, colon cancer, bladder cancer, type II DM, CVA with residual weakness; admitted on 07/23/2017, presented with complaint of cough with blood-tinged sputum, was found to have right upper lobe pneumonia. Currently further plan is continue IV antibiotics.  Assessment and Plan: 1. Hemoptysis. Right lobar pneumonia. She was started on prednisone as an outpatient. Chest x-ray shows right upper lobe pneumonia. No leukocytosis no fever. Blood cultures negative at 24 hours. Sputum cultures were also multiple organism, urine culture pending, HIV screen negative, strep pneumo antigen negative as well. Continue with IV Levaquin for now. Monitor blood cultures .  2. History of bladder cancer. Hematuria. We'll check urine culture. Hematuria likely from bladder cancer currently resolved. Hemoglobin remained stable therefore we will continue Plavix per home regimen.  3. History of CVA. Continue Plavix for now. Presented with hemoptysis as well as a pituitary area which is currently resolved and stable hemoglobin.  4. Essential hypertension. Blood pressure stable. Continue home regimen.  5. Chronic kidney disease stage III. Renal function stable at baseline. Avoid nephrotoxic medication.  6. Type 2 diabetes mellitus. Diabetic nephropathy. Continuing home dose insulin as well as sliding scale.  Diet: carb modified diet DVT Prophylaxis:  SCD  Advance goals of care discussion: full code  Family Communication: family was present at bedside, at the time of interview. The pt provided permission to discuss medical plan with the family. Opportunity was given to ask question and all questions were answered satisfactorily.   Disposition:  Discharge to home with home health.  Consultants: none Procedures: none  Antibiotics: Anti-infectives    Start     Dose/Rate Route Frequency Ordered Stop   07/24/17 1000  levofloxacin (LEVAQUIN) IVPB 750 mg     750 mg 100 mL/hr over 90 Minutes Intravenous Every 48 hours 07/23/17 1213 07/30/17 0959   07/23/17 1000  levofloxacin (LEVAQUIN) IVPB 500 mg     500 mg 100 mL/hr over 60 Minutes Intravenous  Once 07/23/17 0951 07/23/17 1121       Objective: Physical Exam: Vitals:   07/23/17 2042 07/23/17 2049 07/24/17 0541 07/24/17 0819  BP:  135/61 (!) 114/50   Pulse:  86 62 68  Resp:  20 20 18   Temp:  97.8 F (36.6 C) 97.9 F (36.6 C)   TempSrc:  Oral Oral   SpO2: 96% 95% 94% 92%  Weight:      Height:        Intake/Output Summary (Last 24 hours) at 07/24/17 1431 Last data filed at 07/24/17 1200  Gross per 24 hour  Intake              600 ml  Output             1300 ml  Net             -700 ml   Filed Weights   07/23/17 0620  Weight: 86.2 kg (190 lb)   General: Alert, Awake and Oriented  to Time, Place and Person. Appear in mild distress, affect appropriate Eyes: PERRL, Conjunctiva normal ENT: Oral Mucosa clear moist. Neck: no JVD, no Abnormal Mass Or lumps Cardiovascular: S1 and S2 Present, no Murmur, Peripheral Pulses Present Respiratory: normal respiratory effort, Bilateral Air entry equal and Decreased, no use of accessory muscle, right Crackles, no wheezes Abdomen: Bowel Sound present, Soft and bno tenderness, no hernia Skin: no redness, no Rash, no induration Extremities: no Pedal edema, no calf tenderness Neurologic: Grossly no focal neuro deficit. Bilaterally Equal  motor strength  Data Reviewed: CBC:  Recent Labs Lab 07/23/17 0628 07/24/17 1325  WBC 7.6 PENDING  NEUTROABS  --  PENDING  HGB 14.2 14.8  HCT 40.2 40.8  MCV 92.0 90.7  PLT 176 PENDING   Basic Metabolic Panel:  Recent Labs Lab 07/23/17 0628 07/24/17 0353  NA 140 139  K 4.3 3.8  CL 108 106  CO2 20* 23  GLUCOSE 247* 184*  BUN 36* 31*  CREATININE 1.56* 1.58*  CALCIUM 9.2 8.7*    Liver Function Tests:  Recent Labs Lab 07/23/17 0628  AST 23  ALT 32  ALKPHOS 82  BILITOT 0.7  PROT 7.1  ALBUMIN 4.4   No results for input(s): LIPASE, AMYLASE in the last 168 hours. No results for input(s): AMMONIA in the last 168 hours. Coagulation Profile:  Recent Labs Lab 07/24/17 1325  INR 1.14   Cardiac Enzymes: No results for input(s): CKTOTAL, CKMB, CKMBINDEX, TROPONINI in the last 168 hours. BNP (last 3 results) No results for input(s): PROBNP in the last 8760 hours. CBG:  Recent Labs Lab 07/23/17 1214 07/23/17 1634 07/23/17 2041 07/24/17 0737 07/24/17 1200  GLUCAP 163* 186* 177* 167* 162*   Studies: No results found.  Scheduled Meds: . acetaminophen  650 mg Oral QHS  . albuterol  2.5 mg Nebulization TID  . insulin aspart  0-9 Units Subcutaneous TID WC  . insulin aspart  3 Units Subcutaneous TID WC  . insulin detemir  3 Units Subcutaneous QHS  . levothyroxine  125 mcg Oral QAC breakfast  . mirtazapine  15 mg Oral QHS  . mometasone-formoterol  2 puff Inhalation BID  . predniSONE  40 mg Oral Q breakfast  . rosuvastatin  10 mg Oral QHS  . sodium chloride flush  3 mL Intravenous Q12H  . [START ON 07/29/2017] Vitamin D (Ergocalciferol)  50,000 Units Oral Q Mon   Continuous Infusions: . sodium chloride    . levofloxacin (LEVAQUIN) IV Stopped (07/24/17 1121)   PRN Meds: sodium chloride, albuterol, ipratropium-albuterol, ondansetron **OR** ondansetron (ZOFRAN) IV, polyethylene glycol, sodium chloride flush  Time spent: 35 minutes  Author: Berle Mull,  MD [facility] Pager: [phone] 07/24/2017 2:31 PM  If 7PM-7AM, please contact night-coverage at [email or web address], password [facility]

## 2017-07-25 DIAGNOSIS — J181 Lobar pneumonia, unspecified organism: Secondary | ICD-10-CM | POA: Diagnosis not present

## 2017-07-25 DIAGNOSIS — R042 Hemoptysis: Secondary | ICD-10-CM | POA: Diagnosis not present

## 2017-07-25 DIAGNOSIS — N183 Chronic kidney disease, stage 3 (moderate): Secondary | ICD-10-CM | POA: Diagnosis not present

## 2017-07-25 DIAGNOSIS — I10 Essential (primary) hypertension: Secondary | ICD-10-CM | POA: Diagnosis not present

## 2017-07-25 LAB — BASIC METABOLIC PANEL
ANION GAP: 9 (ref 5–15)
BUN: 37 mg/dL — ABNORMAL HIGH (ref 6–20)
CALCIUM: 8.9 mg/dL (ref 8.9–10.3)
CO2: 21 mmol/L — AB (ref 22–32)
CREATININE: 1.59 mg/dL — AB (ref 0.61–1.24)
Chloride: 107 mmol/L (ref 101–111)
GFR, EST AFRICAN AMERICAN: 45 mL/min — AB (ref >60)
GFR, EST NON AFRICAN AMERICAN: 38 mL/min — AB (ref >60)
GLUCOSE: 287 mg/dL — AB (ref 65–99)
Potassium: 4.2 mmol/L (ref 3.5–5.1)
Sodium: 137 mmol/L (ref 135–145)

## 2017-07-25 LAB — CBC
HEMATOCRIT: 36.2 % — AB (ref 39.0–52.0)
Hemoglobin: 12.6 g/dL — ABNORMAL LOW (ref 13.0–17.0)
MCH: 31.9 pg (ref 26.0–34.0)
MCHC: 34.8 g/dL (ref 30.0–36.0)
MCV: 91.6 fL (ref 78.0–100.0)
PLATELETS: 154 10*3/uL (ref 150–400)
RBC: 3.95 MIL/uL — ABNORMAL LOW (ref 4.22–5.81)
RDW: 13.5 % (ref 11.5–15.5)
WBC: 10.2 10*3/uL (ref 4.0–10.5)

## 2017-07-25 LAB — GLUCOSE, CAPILLARY
Glucose-Capillary: 250 mg/dL — ABNORMAL HIGH (ref 65–99)
Glucose-Capillary: 197 mg/dL — ABNORMAL HIGH (ref 65–99)

## 2017-07-25 MED ORDER — PREDNISONE 10 MG PO TABS: ORAL_TABLET | ORAL | 0 refills | Status: DC

## 2017-07-25 MED ORDER — LEVOFLOXACIN 750 MG PO TABS: 750.0000 mg | ORAL_TABLET | Freq: Every day | ORAL | 0 refills | Status: AC

## 2017-07-25 NOTE — Progress Notes (Signed)
Inpatient Diabetes Program Recommendations  AACE/ADA: New Consensus Statement on Inpatient Glycemic Control (2015)  Target Ranges:  Prepandial:   less than 140 mg/dL      Peak postprandial:   less than 180 mg/dL (1-2 hours)      Critically ill patients:  140 - 180 mg/dL   Results for GAREK, SCHUNEMAN (MRN [id number]) as of 07/25/2017 10:37  Ref. Range 07/24/2017 07:37 07/24/2017 12:00 07/24/2017 17:10 07/24/2017 19:59  Glucose-Capillary Latest Ref Range: 65 - 99 mg/dL 167 (H)  5 units Novolog 162 (H)  5 units Novolog 306 (H)  10 units Novolog 302 (H)  3 units Levemir   Results for CHIEF, WALKUP (MRN [id number]) as of 07/25/2017 10:37  Ref. Range 07/25/2017 07:17  Glucose-Capillary Latest Ref Range: 65 - 99 mg/dL 250 (H)  6 units Novolog    Admit with: Pneumonia  History: DM, COPD, CKD  Home DM Meds: Levemir 6 units QHS       Humalog 3 units BID with Breakfast and Dinner  Current Insulin Orders: Levemir 3 units QHS      Novolog Sensitive Correction Scale/ SSI (0-9 units) TID AC      Novolog 3 units TID with meals       MD- Note patient currently receiving Prednisone 40 mg daily.  Having both elevated fasting and postprandial glucose levels.  Please consider the following:  1. Increase Levemir to 6 units QHS (home dose)  2. Increase Novolog Meal Coverage to: Novolog 5 units TID with meals (hold if pt eats <50% of meal)      --Will follow patient during hospitalization--  Wyn Quaker RN, MSN, CDE Diabetes Coordinator Inpatient Glycemic Control Team Team Pager: [phone] (8a-5p)

## 2017-07-25 NOTE — Discharge Summary (Signed)
Physician Discharge Summary  David [name] YNW:[id number] DOB: [birth year] DOA: 07/23/2017  PCP: [name], MD  Admit date: 07/23/2017 Discharge date: 07/25/2017  Admitted From: home Disposition:  Home  Recommendations for Outpatient Follow-up:  1. Follow up with PCP in 1-2 weeks   Home Health:No Equipment/Devices:none  Discharge Condition:stable CODE STATUS:full Diet recommendation: Heart Healthy  Brief/Interim Summary: PMH of HTN, colon cancer, bladder cancer, type II DM, CVA with residual weakness; admitted on 07/23/2017, presented with complaint of cough with blood-tinged sputum, was found to have right upper lobe pneumonia  Discharge Diagnoses:  Active Problems:   HTN (hypertension)   CAP (community acquired pneumonia)   Type 2 diabetes mellitus with diabetic chronic kidney disease (HCC)   CKD (chronic kidney disease) stage 3, GFR 30-59 ml/min   Hemoptysis   Right upper lobe pneumonia ([place])  Hemoptysis secondary to right upper lobe pneumonia: He had no signs of service, he was started on IV empiric antibiotics Levaquin. In mild wheezing on physical exams and he was started on short course of steroids which she will continue as an outpatient. The next day hemoptysis is improved, he was changed to Levaquin which she will continue for a total of 10 days.  Essential hypertension: A changes were made to his medication.  Diabetes mellitus type 2 with chronic renal disease stage III: No changes made to his medication.  Chronic kidney disease stage III: Creatinine remained at baseline.  Discharge Instructions  Discharge Instructions    Diet - low sodium heart healthy    Complete by:  As directed    Increase activity slowly    Complete by:  As directed      Allergies as of 07/25/2017      Reactions   Azithromycin Rash   zpack   Doxycycline Rash      Medication List    TAKE these medications   acetaminophen 500 MG tablet Commonly known as:  TYLENOL Take  500-1,000 mg by mouth at bedtime. Take 500 mg daily at bedtime and may take an additional 500mg  tablet if needed for pain   clopidogrel 75 MG tablet Commonly known as:  PLAVIX Take 75 mg by mouth at bedtime.   ipratropium-albuterol 0.5-2.5 (3) MG/3ML Soln Commonly known as:  DUONEB Take 3 mLs by nebulization every 12 (twelve) hours as needed (shortness of breath/cough/wheezing).   COMBIVENT IN Inhale 1 puff into the lungs 2 (two) times daily.   Fluticasone-Salmeterol 250-50 MCG/DOSE Aepb Commonly known as:  ADVAIR Inhale 1 puff into the lungs every 12 (twelve) hours.   HUMALOG KWIKPEN 100 UNIT/ML KiwkPen Generic drug:  insulin lispro Inject 3 Units into the skin 2 (two) times daily. Before breakfast & before supper   LEVEMIR FLEXTOUCH 100 UNIT/ML Pen Generic drug:  Insulin Detemir Inject 6 Units into the skin at bedtime.   levofloxacin 750 MG tablet Commonly known as:  LEVAQUIN Take 1 tablet (750 mg total) by mouth daily.   levothyroxine 125 MCG tablet Commonly known as:  SYNTHROID, LEVOTHROID Take 125 mcg by mouth daily before breakfast.   losartan-hydrochlorothiazide 100-25 MG tablet Commonly known as:  HYZAAR Take 0.5 tablets by mouth daily.   mirtazapine 15 MG disintegrating tablet Commonly known as:  REMERON SOL-TAB Take 15 mg by mouth at bedtime.   predniSONE 5 MG (21) Tbpk tablet Commonly known as:  STERAPRED UNI-PAK 21 TAB Take 5-10 mg by mouth as directed. Day 1: 5mg  by mouth before breakfast, 5mg  by mouth after lunch, 5mg  by mouth after  dinner, and 10mg  by mouth at bedtime. Day 2: 5mg  by mouth before breakfast, 5mg  by mouth after lunch, 5 mg by mouth after dinner, and 10mg  by mouth at bedtime  Day 3: 5mg  by mouth before breakfast, 5mg  by mouth after lunch, 5mg  by mouth after dinner, and 5mg  by mouth at bedtime  Day 4: 5mg  by mouth before breakfast, 5mg  by mouth after lunch, and 5mg  by mouth at bedtime  Day 5: 5mg  by mouth before breakfast and 5mg  by mouth at  bedtime  Day 6: 5mg  by mouth before breakfast   rosuvastatin 20 MG tablet Commonly known as:  CRESTOR Take 10 mg by mouth at bedtime.   triamcinolone cream 0.1 % Commonly known as:  KENALOG Apply 1 application topically 2 (two) times daily as needed (for rash/skin irritation.).   Vitamin D (Ergocalciferol) 50000 units Caps capsule Commonly known as:  DRISDOL Take 50,000 Units by mouth every Monday.       Allergies  Allergen Reactions  . Azithromycin Rash    zpack  . Doxycycline Rash    Consultations:  None   Procedures/Studies: Dg Chest 2 View  Result Date: 07/23/2017 CLINICAL DATA:  Chronic cough with hemoptysis EXAM: CHEST  2 VIEW COMPARISON:  June 23, 2015 FINDINGS: There is irregular opacity in the right upper lobe near the apex. There is somewhat more ill-defined opacity elsewhere in the right upper lobe, anterior segment, more inferiorly. The lungs elsewhere are clear. Heart size and pulmonary vascularity are normal. No adenopathy. There is aortic atherosclerosis. No evident bone lesions. IMPRESSION: No active cardiopulmonary disease. Areas of opacity felt to represent pneumonia in the right upper lobe. These changes are most notable near the apex. Lungs elsewhere clear. No adenopathy evident. There is aortic atherosclerosis. Followup PA and lateral chest radiographs recommended in 3-4 weeks following trial of antibiotic therapy to ensure resolution and exclude underlying malignancy. Aortic Atherosclerosis (ICD10-I70.0). Electronically Signed   By: Lowella Grip III M.D.   On: 07/23/2017 07:25     Subjective: No known complaints feels current hemoptysis is resolved.  Discharge Exam: Vitals:   07/24/17 2030 07/25/17 0447  BP: 123/64 114/67  Pulse: 78 63  Resp: 18 16  Temp: 98.4 F (36.9 C) 98.1 F (36.7 C)   Vitals:   07/24/17 1453 07/24/17 2030 07/24/17 2123 07/25/17 0447  BP:  123/64  114/67  Pulse:  78  63  Resp: 18 18  16   Temp:  98.4 F (36.9 C)   98.1 F (36.7 C)  TempSrc:  Oral  Oral  SpO2: 95% 98% 94% 98%  Weight:      Height:        General: Pt is alert, awake, not in acute distress Cardiovascular: RRR, S1/S2 +, no rubs, no gallops Respiratory: CTA bilaterally, no wheezing, no rhonchi Abdominal: Soft, NT, ND, bowel sounds + Extremities: no edema, no cyanosis    The results of significant diagnostics from this hospitalization (including imaging, microbiology, ancillary and laboratory) are listed below for reference.     Microbiology: Recent Results (from the past 240 hour(s))  Blood culture (routine x 2)     Status: None (Preliminary result)   Collection Time: 07/23/17 10:20 AM  Result Value Ref Range Status   Specimen Description BLOOD RIGHT ANTECUBITAL  Final   Special Requests   Final    BOTTLES DRAWN AEROBIC AND ANAEROBIC Blood Culture adequate volume   Culture   Final    NO GROWTH 1 DAY Performed at [facility], [place] [address] [place], [place] [address]    Report Status PENDING  Incomplete  Blood culture (routine x 2)     Status: None (Preliminary result)   Collection Time: 07/23/17 10:35 AM  Result Value Ref Range Status   Specimen Description BLOOD LEFT FOREARM  Final   Special Requests   Final    BOTTLES DRAWN AEROBIC AND ANAEROBIC Blood Culture adequate volume   Culture   Final    NO GROWTH 1 DAY Performed at [facility], [place] [address] [place], [place] [address]    Report Status PENDING  Incomplete  Culture, sputum-assessment     Status: None   Collection Time: 07/23/17  3:01 PM  Result Value Ref Range Status   Specimen Description EXPECTORATED SPUTUM  Final   Special Requests NONE  Final   Sputum evaluation THIS SPECIMEN IS ACCEPTABLE FOR SPUTUM CULTURE  Final   Report Status 07/23/2017 FINAL  Final  Culture, respiratory (NON-Expectorated)     Status: None (Preliminary result)   Collection Time: 07/23/17  3:01 PM  Result Value Ref Range Status   Specimen Description  EXPECTORATED SPUTUM  Final   Special Requests NONE Reflexed from [id number]  Final   Gram Stain   Final    ABUNDANT WBC PRESENT, PREDOMINANTLY PMN FEW GRAM POSITIVE COCCI IN PAIRS RARE GRAM NEGATIVE RODS RARE GRAM POSITIVE RODS    Culture   Final    CULTURE REINCUBATED FOR BETTER GROWTH Performed at [facility], [place] [address], [place], [place] [address]    Report Status PENDING  Incomplete     Labs: BNP (last 3 results) No results for input(s): BNP in the last 8760 hours. Basic Metabolic Panel:  Recent Labs Lab 07/23/17 0628 07/24/17 0353 07/25/17 0348  NA 140 139 137  K 4.3 3.8 4.2  CL 108 106 107  CO2 20* 23 21*  GLUCOSE 247* 184* 287*  BUN 36* 31* 37*  CREATININE 1.56* 1.58* 1.59*  CALCIUM 9.2 8.7* 8.9   Liver Function Tests:  Recent Labs Lab 07/23/17 0628  AST 23  ALT 32  ALKPHOS 82  BILITOT 0.7  PROT 7.1  ALBUMIN 4.4   No results for input(s): LIPASE, AMYLASE in the last 168 hours. No results for input(s): AMMONIA in the last 168 hours. CBC:  Recent Labs Lab 07/23/17 0628 07/24/17 1325 07/25/17 0348  WBC 7.6 9.2 10.2  NEUTROABS  --  8.1*  --   HGB 14.2 14.8 12.6*  HCT 40.2 40.8 36.2*  MCV 92.0 90.7 91.6  PLT 176 172 154   Cardiac Enzymes: No results for input(s): CKTOTAL, CKMB, CKMBINDEX, TROPONINI in the last 168 hours. BNP: Invalid input(s): POCBNP CBG:  Recent Labs Lab 07/24/17 0737 07/24/17 1200 07/24/17 1710 07/24/17 1959 07/25/17 0717  GLUCAP 167* 162* 306* 302* 250*   D-Dimer No results for input(s): DDIMER in the last 72 hours. Hgb A1c No results for input(s): HGBA1C in the last 72 hours. Lipid Profile No results for input(s): CHOL, HDL, LDLCALC, TRIG, CHOLHDL, LDLDIRECT in the last 72 hours. Thyroid function studies No results for input(s): TSH, T4TOTAL, T3FREE, THYROIDAB in the last 72 hours.  Invalid input(s): FREET3 Anemia work up No results for input(s): VITAMINB12, FOLATE, FERRITIN, TIBC, IRON, RETICCTPCT  in the last 72 hours. Urinalysis    Component Value Date/Time   COLORURINE STRAW (A) 07/23/2017 1839   APPEARANCEUR CLEAR 07/23/2017 1839   LABSPEC 1.012 07/23/2017 1839   PHURINE 5.0 07/23/2017 1839  GLUCOSEU NEGATIVE 07/23/2017 1839   HGBUR MODERATE (A) 07/23/2017 1839   BILIRUBINUR NEGATIVE 07/23/2017 1839   KETONESUR NEGATIVE 07/23/2017 1839   PROTEINUR NEGATIVE 07/23/2017 1839   UROBILINOGEN 0.2 06/23/2015 1110   NITRITE NEGATIVE 07/23/2017 1839   LEUKOCYTESUR TRACE (A) 07/23/2017 1839   Sepsis Labs Invalid input(s): PROCALCITONIN,  WBC,  LACTICIDVEN Microbiology Recent Results (from the past 240 hour(s))  Blood culture (routine x 2)     Status: None (Preliminary result)   Collection Time: 07/23/17 10:20 AM  Result Value Ref Range Status   Specimen Description BLOOD RIGHT ANTECUBITAL  Final   Special Requests   Final    BOTTLES DRAWN AEROBIC AND ANAEROBIC Blood Culture adequate volume   Culture   Final    NO GROWTH 1 DAY Performed at [facility], [place] [address], [place], [place] [address]    Report Status PENDING  Incomplete  Blood culture (routine x 2)     Status: None (Preliminary result)   Collection Time: 07/23/17 10:35 AM  Result Value Ref Range Status   Specimen Description BLOOD LEFT FOREARM  Final   Special Requests   Final    BOTTLES DRAWN AEROBIC AND ANAEROBIC Blood Culture adequate volume   Culture   Final    NO GROWTH 1 DAY Performed at [facility], [place] [address] [place], [place] [address]    Report Status PENDING  Incomplete  Culture, sputum-assessment     Status: None   Collection Time: 07/23/17  3:01 PM  Result Value Ref Range Status   Specimen Description EXPECTORATED SPUTUM  Final   Special Requests NONE  Final   Sputum evaluation THIS SPECIMEN IS ACCEPTABLE FOR SPUTUM CULTURE  Final   Report Status 07/23/2017 FINAL  Final  Culture, respiratory (NON-Expectorated)     Status: None (Preliminary result)   Collection Time: 07/23/17   3:01 PM  Result Value Ref Range Status   Specimen Description EXPECTORATED SPUTUM  Final   Special Requests NONE Reflexed from [id number]  Final   Gram Stain   Final    ABUNDANT WBC PRESENT, PREDOMINANTLY PMN FEW GRAM POSITIVE COCCI IN PAIRS RARE GRAM NEGATIVE RODS RARE GRAM POSITIVE RODS    Culture   Final    CULTURE REINCUBATED FOR BETTER GROWTH Performed at [facility], [place] [address], [place], [place] [address]    Report Status PENDING  Incomplete     Time coordinating discharge: Over 30 minutes  SIGNED:   Charlynne Cousins, MD  [facility] 07/25/2017, 11:14 AM Pager   If 7PM-7AM, please contact night-coverage [email or web address] Password [email or web address]

## 2017-07-25 NOTE — Progress Notes (Signed)
Pt discharged home with spouse in stable condition. Discharge instructions and scripts given. Pt and spouse verbalized understanding. No immediate questions or concerns at this time. Discharged from unit via wheelchair.  

## 2017-07-25 NOTE — Care Management Note (Signed)
Case Management Note  Patient Details  Name: David [name] MRN: [id number] Date of Birth: 24-Aug-1931  Subjective/Objective:       84 yo admitted with CAP             Action/Plan: From home with spouse. This CM met with pt at bedside and discussed PT recommendations. Pt politely declines home health services at this time. Pt states he is moving around well and has "canes and walkers" at home.   Expected Discharge Date:                  Expected Discharge Plan:  Home/Self Care  In-House Referral:     Discharge planning Services  CM Consult  Post Acute Care Choice:    Choice offered to:     DME Arranged:    DME Agency:     HH Arranged:    HH Agency:     Status of Service:  In process, will continue to follow  If discussed at Long Length of Stay Meetings, dates discussed:    Additional CommentsLynnell Catalan, RN 07/25/2017, 10:40 AM  [phone]

## 2017-07-26 LAB — URINE CULTURE: CULTURE: NO GROWTH

## 2017-07-26 LAB — CULTURE, RESPIRATORY W GRAM STAIN

## 2017-07-26 LAB — CULTURE, RESPIRATORY: CULTURE: NORMAL

## 2017-07-28 LAB — CULTURE, BLOOD (ROUTINE X 2)
Culture: NO GROWTH
Culture: NO GROWTH
Special Requests: ADEQUATE
Special Requests: ADEQUATE

## 2017-08-08 ENCOUNTER — Encounter: Payer: Self-pay | Admitting: Internal Medicine

## 2017-08-15 ENCOUNTER — Ambulatory Visit (INDEPENDENT_AMBULATORY_CARE_PROVIDER_SITE_OTHER): Payer: Medicare Other | Admitting: Podiatry

## 2017-08-15 ENCOUNTER — Encounter: Payer: Self-pay | Admitting: Podiatry

## 2017-08-15 DIAGNOSIS — B351 Tinea unguium: Secondary | ICD-10-CM | POA: Diagnosis not present

## 2017-08-15 DIAGNOSIS — M79676 Pain in unspecified toe(s): Secondary | ICD-10-CM | POA: Diagnosis not present

## 2017-08-15 DIAGNOSIS — E1142 Type 2 diabetes mellitus with diabetic polyneuropathy: Secondary | ICD-10-CM

## 2017-08-15 NOTE — Progress Notes (Signed)
Patient ID: David [name], male   DOB: 12/15/1931, 83 y.o.   MRN: [id number] Complaint:  Visit Type: Patient returns to my office for continued preventative foot care services. Complaint: Patient states" my nails have grown long and thick and become painful to walk and wear shoes" Patient has been diagnosed with DM with no foot complications. The patient presents for preventative foot care services. No changes to ROS.    Podiatric Exam: Vascular: dorsalis pedis and posterior tibial pulses are palpable bilateral. Capillary return is immediate. Temperature gradient is WNL. Skin turgor WNL  Sensorium: Absent  Semmes Weinstein monofilament test. Normal tactile sensation bilaterally. Nail Exam: Pt has thick disfigured discolored nails with subungual debris noted bilateral entire nail hallux through fifth toenails Ulcer Exam: There is no evidence of ulcer or pre-ulcerative changes or infection. Orthopedic Exam: Muscle tone and strength are WNL. No limitations in general ROM. No crepitus or effusions noted. Foot type and digits show no abnormalities. Bony prominences are unremarkable. Skin:  . No infection or ulcers.  Asymptomatic porokeratosis  Diagnosis:  Onychomycosis, , Pain in right toe, pain in left toes,   Treatment & Plan Procedures and Treatment: Consent by patient was obtained for treatment procedures. The patient understood the discussion of treatment and procedures well. All questions were answered thoroughly reviewed. Debridement of mycotic and hypertrophic toenails, 1 through 5 bilateral and clearing of subungual debris. No ulceration, no infection noted.  Return Visit-Office Procedure: Patient instructed to return to the office for a follow up visit 3 months for continued evaluation and treatment.   Gardiner Barefoot DPM

## 2017-08-22 ENCOUNTER — Ambulatory Visit (AMBULATORY_SURGERY_CENTER): Payer: Medicare Other | Admitting: Internal Medicine

## 2017-08-22 ENCOUNTER — Encounter: Payer: Self-pay | Admitting: Internal Medicine

## 2017-08-22 VITALS — BP 115/58 | HR 57 | Temp 97.7°F | Resp 19 | Ht 72.0 in | Wt 192.0 lb

## 2017-08-22 DIAGNOSIS — Z86010 Personal history of colonic polyps: Secondary | ICD-10-CM | POA: Diagnosis present

## 2017-08-22 DIAGNOSIS — D122 Benign neoplasm of ascending colon: Secondary | ICD-10-CM | POA: Diagnosis not present

## 2017-08-22 DIAGNOSIS — Z85038 Personal history of other malignant neoplasm of large intestine: Secondary | ICD-10-CM

## 2017-08-22 MED ORDER — SODIUM CHLORIDE 0.9 % IV SOLN: 500.0000 mL | INTRAVENOUS | Status: AC

## 2017-08-22 NOTE — Progress Notes (Signed)
Called to room to assist during endoscopic procedure.  Patient ID and intended procedure confirmed with present staff. Received instructions for my participation in the procedure from the performing physician.  

## 2017-08-22 NOTE — Progress Notes (Signed)
Report to PACU, RN, vss, BBS= Clear.  

## 2017-08-22 NOTE — Patient Instructions (Signed)
YOU HAD AN ENDOSCOPIC PROCEDURE TODAY AT [facility]:   Refer to the procedure report that was given to you for any specific questions about what was found during the examination.  If the procedure report does not answer your questions, please call your gastroenterologist to clarify.  If you requested that your care partner not be given the details of your procedure findings, then the procedure report has been included in a sealed envelope for you to review at your convenience later.  YOU SHOULD EXPECT: Some feelings of bloating in the abdomen. Passage of more gas than usual.  Walking can help get rid of the air that was put into your GI tract during the procedure and reduce the bloating. If you had a lower endoscopy (such as a colonoscopy or flexible sigmoidoscopy) you may notice spotting of blood in your stool or on the toilet paper. If you underwent a bowel prep for your procedure, you may not have a normal bowel movement for a few days.  Please Note:  You might notice some irritation and congestion in your nose or some drainage.  This is from the oxygen used during your procedure.  There is no need for concern and it should clear up in a day or so.  SYMPTOMS TO REPORT IMMEDIATELY:   Following lower endoscopy (colonoscopy or flexible sigmoidoscopy):  Excessive amounts of blood in the stool  Significant tenderness or worsening of abdominal pains  Swelling of the abdomen that is new, acute  Fever of 100F or higher  For urgent or emergent issues, a gastroenterologist can be reached at any hour by calling [phone].  DIET:  We do recommend a small meal at first, but then you may proceed to your regular diet.  Drink plenty of fluids but you should avoid alcoholic beverages for 24 hours.  ACTIVITY:  You should plan to take it easy for the rest of today and you should NOT DRIVE or use heavy machinery until tomorrow (because of the sedation medicines used during the test).     FOLLOW UP: Our staff will call the number listed on your records the next business day following your procedure to check on you and address any questions or concerns that you may have regarding the information given to you following your procedure. If we do not reach you, we will leave a message.  However, if you are feeling well and you are not experiencing any problems, there is no need to return our call.  We will assume that you have returned to your regular daily activities without incident.  If any biopsies were taken you will be contacted by phone or by letter within the next 1-3 weeks.  Please call us at [phone] if you have not heard about the biopsies in 3 weeks.    SIGNATURES/CONFIDENTIALITY: You and/or your care partner have signed paperwork which will be entered into your electronic medical record.  These signatures attest to the fact that that the information above on your After Visit Summary has been reviewed and is understood.  Full responsibility of the confidentiality of this discharge information lies with you and/or your care-partner.  Await pathology  Please read over handouts about polyps, hemorrhoids and high fiber diets  Continue your normal medication- including Plavix today  Your blood sugar in the recovery room- 161

## 2017-08-22 NOTE — Op Note (Signed)
[facility] Patient Name: David [name] Procedure Date: 08/22/2017 8:50 AM MRN: [id number] Endoscopist: Docia Chuck. Henrene Pastor , MD Age: 83 Referring MD:  Date of Birth: [birth year] Gender: Male Account #: [id number] Procedure:                Colonoscopy, with cold snare polypectomy X3 Indications:              High risk colon cancer surveillance: Personal                            history of multiple (3 or more) adenomas, High risk                            colon cancer surveillance: Personal history of                            colon cancer 2007 status post sigmoid colectomy;                            surveillance 2008, 2010, 2013. Medicines:                Monitored Anesthesia Care Procedure:                Pre-Anesthesia Assessment:                           - Prior to the procedure, a History and Physical                            was performed, and patient medications and                            allergies were reviewed. The patient's tolerance of                            previous anesthesia was also reviewed. The risks                            and benefits of the procedure and the sedation                            options and risks were discussed with the patient.                            All questions were answered, and informed consent                            was obtained. Prior Anticoagulants: The patient has                            taken Plavix (clopidogrel), last dose was 1 day                            prior to procedure. ASA Grade Assessment: III - A  patient with severe systemic disease. After                            reviewing the risks and benefits, the patient was                            deemed in satisfactory condition to undergo the                            procedure.                           After obtaining informed consent, the colonoscope                            was passed under direct vision.  Throughout the                            procedure, the patient's blood pressure, pulse, and                            oxygen saturations were monitored continuously. The                            Colonoscope was introduced through the anus and                            advanced to the the cecum, identified by                            appendiceal orifice and ileocecal valve. The                            ileocecal valve, appendiceal orifice, and rectum                            were photographed. The quality of the bowel                            preparation was excellent. The colonoscopy was                            performed without difficulty. The patient tolerated                            the procedure well. The bowel preparation used was                            SUPREP. Scope In: 9:11:49 AM Scope Out: 9:26:54 AM Scope Withdrawal Time: 0 hours 12 minutes 49 seconds  Total Procedure Duration: 0 hours 15 minutes 5 seconds  Findings:                 Three polyps were found in the transverse colon and  ascending colon. The polyps were 1 to 3 mm in size.                            These polyps were removed with a cold snare.                            Resection and retrieval were complete.                           Internal hemorrhoids were found during retroflexion.                           The exam was otherwise without abnormality on                            direct and retroflexion views. Complications:            No immediate complications. Estimated blood loss:                            None. Estimated Blood Loss:     Estimated blood loss: none. Impression:               - Three 1 to 3 mm polyps in the transverse colon                            and in the ascending colon, removed with a cold                            snare. Resected and retrieved.                           - Internal hemorrhoids.                           - The  examination was otherwise normal on direct                            and retroflexion views. Recommendation:           - Repeat colonoscopy is not recommended for                            surveillance.                           - Resume Plavix (clopidogrel) today at prior dose.                           - Patient has a contact number available for                            emergencies. The signs and symptoms of potential                            delayed complications were discussed with the  patient. Return to normal activities tomorrow.                            Written discharge instructions were provided to the                            patient.                           - Resume previous diet.                           - Continue present medications.                           - Await pathology results. Docia Chuck. Henrene Pastor, MD 08/22/2017 9:32:06 AM This report has been signed electronically.

## 2017-08-23 ENCOUNTER — Telehealth: Payer: Self-pay | Admitting: *Deleted

## 2017-08-23 NOTE — Telephone Encounter (Signed)
  Follow up Call-  Call back number 08/22/2017  Post procedure Call Back phone  # [phone]  Permission to leave phone message Yes  Some recent data might be hidden     Patient questions:  Do you have a fever, pain , or abdominal swelling? No. Pain Score  0 *  Have you tolerated food without any problems? Yes.    Have you been able to return to your normal activities? Yes.    Do you have any questions about your discharge instructions: Diet   No. Medications  No. Follow up visit  No.  Do you have questions or concerns about your Care? No.  Actions: * If pain score is 4 or above: No action needed, pain <4.

## 2017-08-27 ENCOUNTER — Encounter: Payer: Self-pay | Admitting: Internal Medicine

## 2017-09-15 NOTE — Progress Notes (Signed)
   07/24/17 1200  PT Time Calculation  PT Start Time (ACUTE ONLY) 1033  PT Stop Time (ACUTE ONLY) 1052  PT Time Calculation (min) (ACUTE ONLY) 19 min  PT G-Codes **NOT FOR INPATIENT CLASS**  Functional Assessment Tool Used AM-PAC 6 Clicks Basic Mobility  Functional Limitation Mobility: Walking and moving around  Mobility: Walking and Moving Around Current Status (K9983) CK  Mobility: Walking and Moving Around Goal Status (J8250) CI  PT General Charges  $$ ACUTE PT VISIT 1 Visit  PT Evaluation  $PT Eval Low Complexity 1 Low

## 2017-10-06 IMAGING — CR DG CHEST 2V
2 series · 2 of 2 positions shown · non-contrast
Comparison: June 23, 2015

CLINICAL DATA: Chronic cough with hemoptysis

EXAM:
CHEST  2 VIEW

[w chest pa]
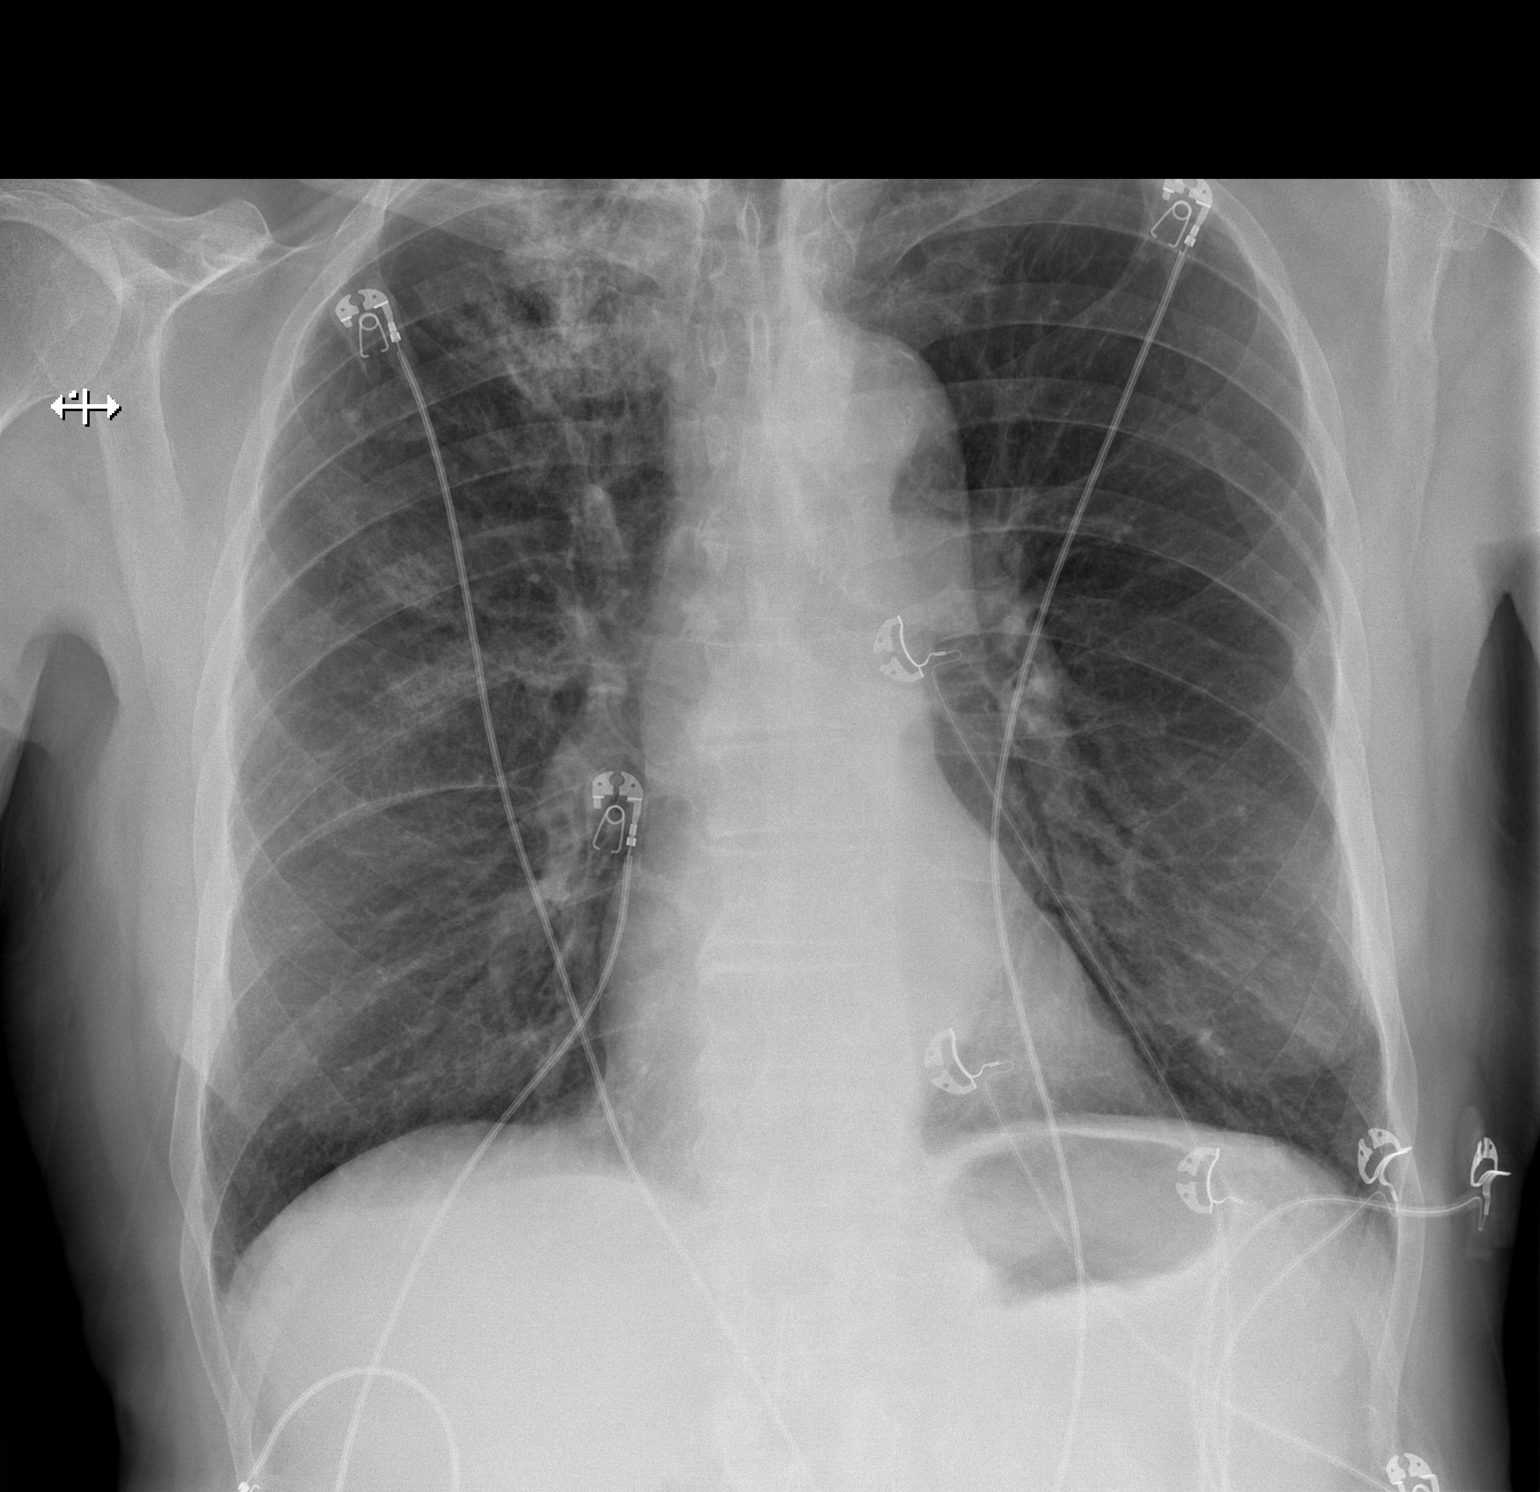

[w chest lat]
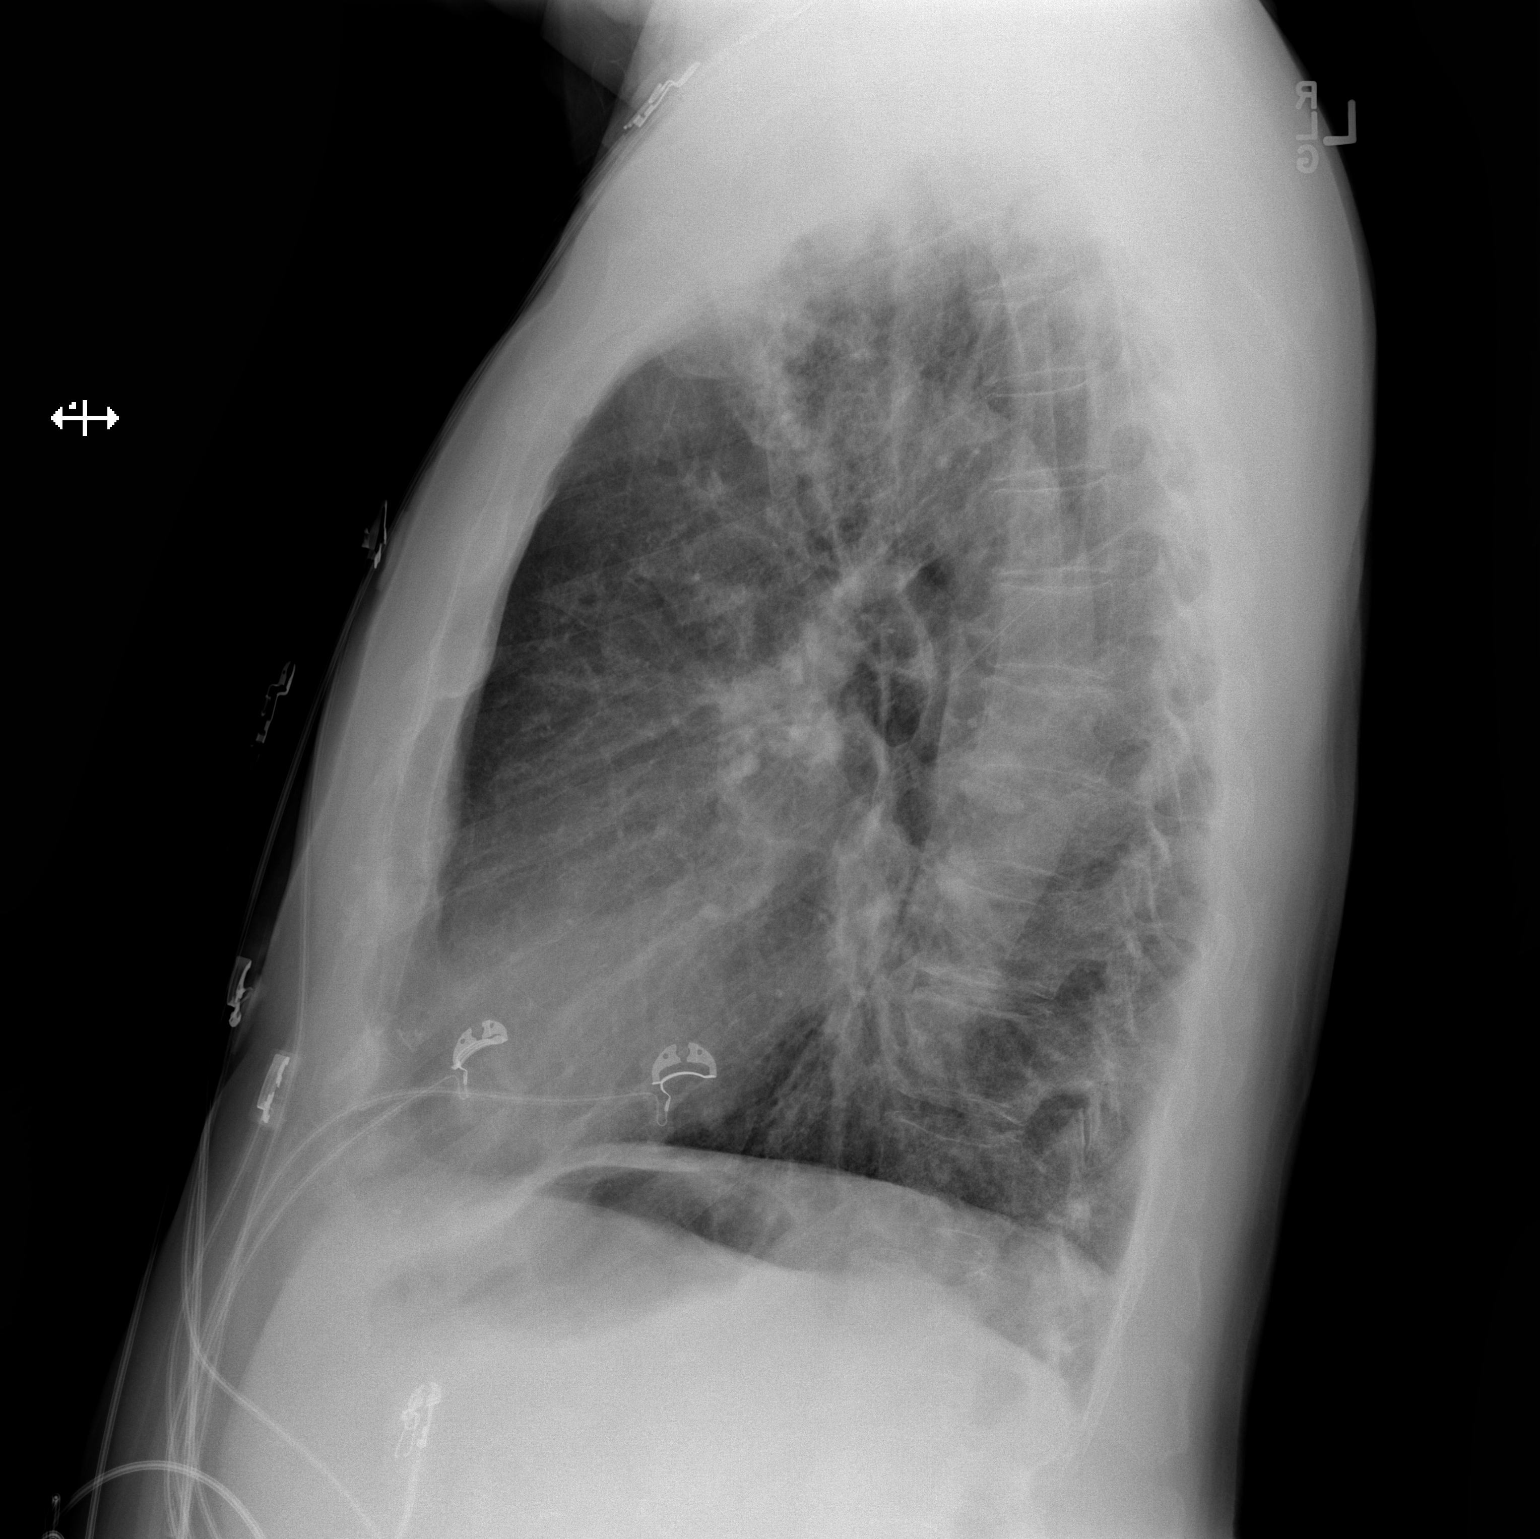

[2 of 2 positions shown; findings below may reference images not displayed]

FINDINGS: There is irregular opacity in the right upper lobe near the apex.
There is somewhat more ill-defined opacity elsewhere in the right
upper lobe, anterior segment, more inferiorly. The lungs elsewhere
are clear. Heart size and pulmonary vascularity are normal. No
adenopathy. There is aortic atherosclerosis. No evident bone
lesions.
IMPRESSION: No active cardiopulmonary disease. Areas of opacity felt to
represent pneumonia in the right upper lobe. These changes are most
notable near the apex. Lungs elsewhere clear. No adenopathy evident.
There is aortic atherosclerosis.

Followup PA and lateral chest radiographs recommended in 3-4 weeks
following trial of antibiotic therapy to ensure resolution and
exclude underlying malignancy.

Aortic Atherosclerosis ([id number].X).

## 2017-10-30 ENCOUNTER — Ambulatory Visit: Payer: Medicare Other | Admitting: Podiatry

## 2017-10-30 ENCOUNTER — Encounter: Payer: Self-pay | Admitting: Podiatry

## 2017-10-30 DIAGNOSIS — E1142 Type 2 diabetes mellitus with diabetic polyneuropathy: Secondary | ICD-10-CM | POA: Diagnosis not present

## 2017-10-30 DIAGNOSIS — B351 Tinea unguium: Secondary | ICD-10-CM

## 2017-10-30 DIAGNOSIS — M79676 Pain in unspecified toe(s): Secondary | ICD-10-CM

## 2017-10-30 NOTE — Progress Notes (Signed)
Patient ID: David [name], male   DOB: December 01, 1931, 83 y.o.   MRN: [id number] Complaint:  Visit Type: Patient returns to my office for continued preventative foot care services. Complaint: Patient states" my nails have grown long and thick and become painful to walk and wear shoes" Patient has been diagnosed with DM with no foot complications. The patient presents for preventative foot care services. No changes to ROS.    Podiatric Exam: Vascular: dorsalis pedis and posterior tibial pulses are palpable bilateral. Capillary return is immediate. Temperature gradient is WNL. Skin turgor WNL  Sensorium: Absent  Semmes Weinstein monofilament test. Normal tactile sensation bilaterally. Nail Exam: Pt has thick disfigured discolored nails with subungual debris noted bilateral entire nail hallux through fifth toenails Ulcer Exam: There is no evidence of ulcer or pre-ulcerative changes or infection. Orthopedic Exam: Muscle tone and strength are WNL. No limitations in general ROM. No crepitus or effusions noted. Foot type and digits show no abnormalities. Bony prominences are unremarkable. Skin:  . No infection or ulcers.  Asymptomatic porokeratosis  Diagnosis:  Onychomycosis, , Pain in right toe, pain in left toes,   Treatment & Plan Procedures and Treatment: Consent by patient was obtained for treatment procedures. The patient understood the discussion of treatment and procedures well. All questions were answered thoroughly reviewed. Debridement of mycotic and hypertrophic toenails, 1 through 5 bilateral and clearing of subungual debris. No ulceration, no infection noted.  Return Visit-Office Procedure: Patient instructed to return to the office for a follow up visit 3 months for continued evaluation and treatment.   Gardiner Barefoot DPM

## 2017-10-31 ENCOUNTER — Other Ambulatory Visit: Payer: Self-pay | Admitting: Dermatology

## 2018-01-08 ENCOUNTER — Ambulatory Visit: Payer: Medicare Other | Admitting: Podiatry

## 2018-01-08 ENCOUNTER — Encounter: Payer: Self-pay | Admitting: Podiatry

## 2018-01-08 DIAGNOSIS — E1142 Type 2 diabetes mellitus with diabetic polyneuropathy: Secondary | ICD-10-CM

## 2018-01-08 DIAGNOSIS — B351 Tinea unguium: Secondary | ICD-10-CM

## 2018-01-08 DIAGNOSIS — M2011 Hallux valgus (acquired), right foot: Secondary | ICD-10-CM

## 2018-01-08 DIAGNOSIS — M79676 Pain in unspecified toe(s): Secondary | ICD-10-CM

## 2018-01-08 DIAGNOSIS — M2012 Hallux valgus (acquired), left foot: Secondary | ICD-10-CM

## 2018-01-08 DIAGNOSIS — E1151 Type 2 diabetes mellitus with diabetic peripheral angiopathy without gangrene: Secondary | ICD-10-CM

## 2018-01-08 DIAGNOSIS — D689 Coagulation defect, unspecified: Secondary | ICD-10-CM

## 2018-01-08 NOTE — Progress Notes (Signed)
Patient ID: David [name], male   DOB: November 16, 1931, 84 y.o.   MRN: [id number] Complaint:  Visit Type: Patient returns to my office for continued preventative foot care services. Complaint: Patient states" my nails have grown long and thick and become painful to walk and wear shoes" Patient has been diagnosed with DM neuropathy and angiopathy.. The patient presents for preventative foot care services. No changes to ROS.    Podiatric Exam: Vascular: dorsalis pedis and posterior tibial pulses are weakly  palpable bilateral. Capillary return is immediate. Temperature gradient is WNL. Skin turgor WNL Venous staisis B/L. Sensorium: Absent  Semmes Weinstein monofilament test. Normal tactile sensation bilaterally. Nail Exam: Pt has thick disfigured discolored nails with subungual debris noted bilateral entire nail hallux through fifth toenails Ulcer Exam: There is no evidence of ulcer or pre-ulcerative changes or infection. Orthopedic Exam: Muscle tone and strength are WNL. No limitations in general ROM. No crepitus or effusions noted. Foot type and digits show no abnormalities. HAV  B/L Skin:  . No infection or ulcers.  Asymptomatic porokeratosis  Diagnosis:  Onychomycosis, , Pain in right toe, pain in left toes,   Treatment & Plan Procedures and Treatment: Consent by patient was obtained for treatment procedures. The patient understood the discussion of treatment and procedures well. All questions were answered thoroughly reviewed. Debridement of mycotic and hypertrophic toenails, 1 through 5 bilateral and clearing of subungual debris. No ulceration, no infection noted.  Return Visit-Office Procedure: Patient instructed to return to the office for a follow up visit 9 weeks  for continued evaluation and treatment.   Gardiner Barefoot DPM

## 2018-03-12 ENCOUNTER — Encounter: Payer: Self-pay | Admitting: Podiatry

## 2018-03-12 ENCOUNTER — Ambulatory Visit: Payer: Medicare Other | Admitting: Podiatry

## 2018-03-12 DIAGNOSIS — B351 Tinea unguium: Secondary | ICD-10-CM

## 2018-03-12 DIAGNOSIS — M79676 Pain in unspecified toe(s): Secondary | ICD-10-CM | POA: Diagnosis not present

## 2018-03-12 DIAGNOSIS — D689 Coagulation defect, unspecified: Secondary | ICD-10-CM

## 2018-03-12 DIAGNOSIS — E1142 Type 2 diabetes mellitus with diabetic polyneuropathy: Secondary | ICD-10-CM

## 2018-03-12 NOTE — Progress Notes (Signed)
Patient ID: David [name], male   DOB: 10-Nov-1931, 84 y.o.   MRN: [id number] Complaint:  Visit Type: Patient returns to my office for continued preventative foot care services. Complaint: Patient states" my nails have grown long and thick and become painful to walk and wear shoes" Patient has been diagnosed with DM neuropathy and angiopathy.. The patient presents for preventative foot care services. No changes to ROS.  Patient is taking plavix.  Podiatric Exam: Vascular: dorsalis pedis and posterior tibial pulses are weakly  palpable bilateral. Capillary return is immediate. Temperature gradient is WNL. Skin turgor WNL Venous staisis B/L. Sensorium: Absent  Semmes Weinstein monofilament test. Normal tactile sensation bilaterally. Nail Exam: Pt has thick disfigured discolored nails with subungual debris noted bilateral entire nail hallux through fifth toenails Ulcer Exam: There is no evidence of ulcer or pre-ulcerative changes or infection. Orthopedic Exam: Muscle tone and strength are WNL. No limitations in general ROM. No crepitus or effusions noted. Foot type and digits show no abnormalities. HAV  B/L Skin:  . No infection or ulcers.  Asymptomatic porokeratosis  Diagnosis:  Onychomycosis, , Pain in right toe, pain in left toes,   Treatment & Plan Procedures and Treatment: Consent by patient was obtained for treatment procedures. The patient understood the discussion of treatment and procedures well. All questions were answered thoroughly reviewed. Debridement of mycotic and hypertrophic toenails, 1 through 5 bilateral and clearing of subungual debris. No ulceration, no infection noted.  Return Visit-Office Procedure: Patient instructed to return to the office for a follow up visit 9 weeks  for continued evaluation and treatment.   Gardiner Barefoot DPM

## 2018-05-14 ENCOUNTER — Ambulatory Visit: Payer: Medicare Other | Admitting: Podiatry

## 2018-05-14 ENCOUNTER — Encounter: Payer: Self-pay | Admitting: Podiatry

## 2018-05-14 DIAGNOSIS — M79676 Pain in unspecified toe(s): Secondary | ICD-10-CM | POA: Diagnosis not present

## 2018-05-14 DIAGNOSIS — E1142 Type 2 diabetes mellitus with diabetic polyneuropathy: Secondary | ICD-10-CM

## 2018-05-14 DIAGNOSIS — D689 Coagulation defect, unspecified: Secondary | ICD-10-CM | POA: Diagnosis not present

## 2018-05-14 DIAGNOSIS — B351 Tinea unguium: Secondary | ICD-10-CM | POA: Diagnosis not present

## 2018-05-14 NOTE — Progress Notes (Signed)
Patient ID: David [name], male   DOB: 10/20/1931, 84 y.o.   MRN: [id number] Complaint:  Visit Type: Patient returns to my office for continued preventative foot care services. Complaint: Patient states" my nails have grown long and thick and become painful to walk and wear shoes" Patient has been diagnosed with DM neuropathy and angiopathy.. The patient presents for preventative foot care services. No changes to ROS.  Patient is taking plavix.  Podiatric Exam: Vascular: dorsalis pedis and posterior tibial pulses are weakly  palpable bilateral. Capillary return is immediate. Temperature gradient is WNL. Skin turgor WNL Venous staisis B/L. Sensorium: Absent  Semmes Weinstein monofilament test. Normal tactile sensation bilaterally. Nail Exam: Pt has thick disfigured discolored nails with subungual debris noted bilateral entire nail hallux through fifth toenails Ulcer Exam: There is no evidence of ulcer or pre-ulcerative changes or infection. Orthopedic Exam: Muscle tone and strength are WNL. No limitations in general ROM. No crepitus or effusions noted. Foot type and digits show no abnormalities. HAV  B/L Skin:  . No infection or ulcers.  Asymptomatic porokeratosis  Diagnosis:  Onychomycosis, , Pain in right toe, pain in left toes,   Treatment & Plan Procedures and Treatment: Consent by patient was obtained for treatment procedures. The patient understood the discussion of treatment and procedures well. All questions were answered thoroughly reviewed. Debridement of mycotic and hypertrophic toenails, 1 through 5 bilateral and clearing of subungual debris. No ulceration, no infection noted.  Return Visit-Office Procedure: Patient instructed to return to the office for a follow up visit 9 weeks  for continued evaluation and treatment.   Gardiner Barefoot DPM

## 2018-06-11 ENCOUNTER — Encounter (HOSPITAL_COMMUNITY): Payer: Medicare Other

## 2018-06-11 ENCOUNTER — Ambulatory Visit: Payer: Medicare Other | Admitting: Family

## 2018-06-12 ENCOUNTER — Other Ambulatory Visit: Payer: Self-pay

## 2018-06-12 ENCOUNTER — Encounter: Payer: Self-pay | Admitting: Family

## 2018-06-12 ENCOUNTER — Ambulatory Visit (HOSPITAL_COMMUNITY)
Admission: RE | Admit: 2018-06-12 | Discharge: 2018-06-12 | Disposition: A | Discharge status: Home / Self Care | Payer: Medicare Other | Source: Ambulatory Visit | Attending: Family | Admitting: Family

## 2018-06-12 ENCOUNTER — Ambulatory Visit: Payer: Medicare Other | Admitting: Family

## 2018-06-12 ENCOUNTER — Ambulatory Visit (INDEPENDENT_AMBULATORY_CARE_PROVIDER_SITE_OTHER)
Admission: RE | Admit: 2018-06-12 | Discharge: 2018-06-12 | Disposition: A | Discharge status: Home / Self Care | Payer: Medicare Other | Source: Ambulatory Visit | Attending: Family | Admitting: Family

## 2018-06-12 VITALS — BP 128/71 | HR 57 | Temp 96.9°F | Resp 16 | Ht 72.0 in | Wt 185.0 lb

## 2018-06-12 DIAGNOSIS — R0989 Other specified symptoms and signs involving the circulatory and respiratory systems: Secondary | ICD-10-CM

## 2018-06-12 DIAGNOSIS — I6529 Occlusion and stenosis of unspecified carotid artery: Secondary | ICD-10-CM | POA: Diagnosis not present

## 2018-06-12 DIAGNOSIS — Z8679 Personal history of other diseases of the circulatory system: Secondary | ICD-10-CM | POA: Diagnosis not present

## 2018-06-12 DIAGNOSIS — I6523 Occlusion and stenosis of bilateral carotid arteries: Secondary | ICD-10-CM

## 2018-06-12 DIAGNOSIS — I77810 Thoracic aortic ectasia: Secondary | ICD-10-CM

## 2018-06-12 DIAGNOSIS — Z9889 Other specified postprocedural states: Secondary | ICD-10-CM

## 2018-06-12 NOTE — Progress Notes (Signed)
[facility] [place]  Follow up s/p Open AAA Repair  History of Present Illness  David [name] is a 84 y.o. (22-Jan-1931) male who is s/p open juxtarenal aneurysm repair by Dr. Donnetta Hutching. He had a straight graft 24 mm in diameter. Surgery was on 11/08/2014.  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 He reports intermittent neuropathy sensation in feet: tingling, numbness, burning. He denies slow healing wounds.  States he can walk about 1/4 mile before legs feel tired, left leg moreso since stroke. He also has a history lumbar disc issues, but this has not bothered him lately.  He is taking Plavix and a statin daily.  He reports 2 strokes in 2006, wife states confirmed by MRI of his head. Carotid duplex in September 2015 showed <40% bilateral ICA stenoses.   He had bladder cancer, treated with small tumors removed and installation of chemo. He had thyroid cancer treated with radioiodine ablation, according to pt description.   Diabetic: Yes, Pt states his last A1C was almost 7.0 Tobacco use: former smoker, quit in 2006 when he was diagnosed with colon and thyroid cancer   Past Medical History:  Diagnosis Date  . AAA (abdominal aortic aneurysm) ([place])   . AAA (abdominal aortic aneurysm) ([place])   . AAA (abdominal aortic aneurysm) ([place])   . Atherosclerosis    pt denies this  . BPH (benign prostatic hyperplasia)   . CKD (chronic kidney disease), stage III [facility])    family reports caused by scans but that has resolved since CT scans have stopped  . Colon cancer ([place])   . COPD (chronic obstructive pulmonary disease) ([place])   . Diabetes mellitus   . ED (erectile dysfunction)   . Gastroesophageal reflux disease   . Heart murmur   . Hyperlipidemia   . Hypertension   . Lung nodules   . Neuromuscular disorder ([facility])    Neuropathy feet  . Osteopenia   . Peripheral vascular disease ([place])   . Pneumonia June 2013  . Positional vertigo    intermittent    . Shortness of breath    chronic per patient  . Stroke ([place])    x2 in 2006  . Stroke ([place])   . Thyroid cancer ([place])   . Thyroid disease   . Type 2 diabetes mellitus ([place])   . Vitamin D deficiency     Past Surgical History:  Procedure Laterality Date  . ABDOMINAL AORTIC ANEURYSM REPAIR N/A 11/08/2014   Procedure: ANEURYSM ABDOMINAL AORTIC REPAIR;  Surgeon: Rosetta Posner, MD;  Location: [facility];  Service: Vascular;  Laterality: N/A;  . ABDOMINAL AORTIC ANEURYSM REPAIR    . COLON RESECTION  2007  . COLON SURGERY  2006   Resection  . SEPTOPLASTY    . THYROIDECTOMY  2007  . TRANSURETHRAL RESECTION OF BLADDER TUMOR WITH MITOMYCIN-C N/A 02/12/2017   Procedure: TRANSURETHRAL RESECTION OF BLADDER TUMOR;  Surgeon: Irine Seal, MD;  Location: [facility];  Service: Urology;  Laterality: N/A;   Social History Social History   Socioeconomic History  . Marital status: Married    Spouse name: Not on file  . Number of children: Not on file  . Years of education: Not on file  . Highest education level: Not on file  Occupational History  . Occupation: Retired    Fish farm manager: RETIRED  Social Needs  . Financial resource strain: Not on file  . Food insecurity:    Worry: Not on file  Inability: Not on file  . Transportation needs:    Medical: Not on file    Non-medical: Not on file  Tobacco Use  . Smoking status: Former Smoker    Packs/day: 1.50    Years: 50.00    Pack years: 75.00    Types: Cigarettes    Last attempt to quit: 05/28/2005    Years since quitting: 13.0  . Smokeless tobacco: Never Used  Substance and Sexual Activity  . Alcohol use: No    Alcohol/week: 0.0 oz  . Drug use: No  . Sexual activity: Not Currently  Lifestyle  . Physical activity:    Days per week: Not on file    Minutes per session: Not on file  . Stress: Not on file  Relationships  . Social connections:    Talks on phone: Not on file    Gets together: Not on file    Attends religious service: Not on file     Active member of club or organization: Not on file    Attends meetings of clubs or organizations: Not on file    Relationship status: Not on file  . Intimate partner violence:    Fear of current or ex partner: Not on file    Emotionally abused: Not on file    Physically abused: Not on file    Forced sexual activity: Not on file  Other Topics Concern  . Not on file  Social History Narrative  . Not on file   Family History Family History  Problem Relation Age of Onset  . Stomach cancer Mother   . Heart failure Mother   . Breast cancer Mother   . Pneumonia Father   . Diabetes Brother   . Heart disease Brother        Heart Disease before age 43,  AAA and  Carotid  . Heart attack Brother   . Diabetes Paternal Uncle    Current Outpatient Medications on File Prior to Visit  Medication Sig Dispense Refill  . acetaminophen (TYLENOL) 500 MG tablet Take 500-1,000 mg by mouth at bedtime. Take 500 mg daily at bedtime and may take an additional 500mg  tablet if needed for pain    . clopidogrel (PLAVIX) 75 MG tablet Take 75 mg by mouth at bedtime.     . Fluticasone-Salmeterol (ADVAIR) 250-50 MCG/DOSE AEPB Inhale 1 puff into the lungs every 12 (twelve) hours.    . gabapentin (NEURONTIN) 100 MG capsule TAKE 1 CAP BY MOUTH UP TO THREE TIMES A DAY AS NEEDED FOR NERVE PAIN FROM SHINGLES  4  . insulin lispro (HUMALOG KWIKPEN) 100 UNIT/ML KiwkPen Inject 3 Units into the skin 2 (two) times daily. Before breakfast & before supper    . Ipratropium-Albuterol (COMBIVENT IN) Inhale 1 puff into the lungs 2 (two) times daily.     Marland Kitchen ipratropium-albuterol (DUONEB) 0.5-2.5 (3) MG/3ML SOLN Take 3 mLs by nebulization every 12 (twelve) hours as needed (shortness of breath/cough/wheezing).     Ernest Mallick FLEXTOUCH 100 UNIT/ML Pen Inject 6 Units into the skin at bedtime.     Marland Kitchen levothyroxine (SYNTHROID, LEVOTHROID) 125 MCG tablet Take 125 mcg by mouth daily before breakfast.     . losartan-hydrochlorothiazide (HYZAAR)  100-25 MG per tablet Take 0.5 tablets by mouth daily.     . mirtazapine (REMERON SOL-TAB) 15 MG disintegrating tablet Take 15 mg by mouth at bedtime.    . rosuvastatin (CRESTOR) 20 MG tablet Take 10 mg by mouth at bedtime.     Marland Kitchen  triamcinolone cream (KENALOG) 0.1 % Apply 1 application topically 2 (two) times daily as needed (for rash/skin irritation.).     Marland Kitchen Vitamin D, Ergocalciferol, (DRISDOL) 50000 UNITS CAPS Take 50,000 Units by mouth every Monday.      Current Facility-Administered Medications on File Prior to Visit  Medication Dose Route Frequency Provider Last Rate Last Dose  . 0.9 %  sodium chloride infusion  500 mL Intravenous Continuous Irene Shipper, MD       Allergies  Allergen Reactions  . Azithromycin Rash    zpack  . Doxycycline Rash    ROS: See HPI for pertinent positives and negatives.    Physical Examination  Vitals:   06/12/18 1133 06/12/18 1136 06/12/18 1137  BP: (!) 142/74 129/72 128/71  Pulse: (!) 57 (!) 57 (!) 57  Resp: 16    Temp: (!) 96.9 F (36.1 C)    TempSrc: Oral    SpO2: 98%    Weight: 185 lb (83.9 kg)    Height: 6' (1.829 m)     Body mass index is 25.09 kg/m.  General: A&O x 3, WD male HEENT: no gross abnormalities  Pulmonary: Sym exp, respirations are non labored, limited air movement in all fields,  no rales, rhonchi, or wheezing.  Cardiac: RRR, Nl S1, S2, no detected murmur.   Carotid Bruits Right Left   Negative Positive     Abdominal aortic pulse is not palpable. Radial pulses are 2+ and equal.                         VASCULAR EXAM: Extremities without ischemic changes, without Gangrene; without open wounds.                                                                                                          LE Pulses Right Left       FEMORAL  1+ palpable  2+ palpable        POPLITEAL  2+ palpable   1+ palpable       POSTERIOR TIBIAL  2+ palpable   2+ palpable        DORSALIS PEDIS      ANTERIOR TIBIAL not palpable   not palpable     Gastrointestinal: soft, NTND, -G/R, - HSM, - palpable masses, - CVAT B. Musculoskeletal: M/S 5/5 throughout, Extremities without ischemic changes. Skin: no rash, no cellulitis, no ulcers noted.  Neurologic: Pain and light touch intact in extremities  except diminished sensation in feet, Motor exam as listed above. CN 2-12 intact except is hard of hearing.  Psychiatric: Normal thought content, mood appropriate to clinical situation.      DATA:  Carotid Duplex (06/12/18): Bilateral ICA with 1-39% stenosis Bilateral vertebral artery flow is antegrade.  Bilateral subclavian artery waveforms are normal.  No change compared to the exam on 09-21-14.   Left Popliteal Artery Duplex (06/12/18); No aneurysm   CTA chest 09-21-14: FINDINGS: CTA CHEST FINDINGS  Incomplete contrast opacification of pulmonary artery branches ; the exam was not optimized  for detection of pulmonary emboli. Patent superior and inferior pulmonary veins bilaterally. Patchy coronary calcifications. Patchy atheromatous plaque throughout the thoracic aorta. Proximal ascending measures 3.6 cm maximum transverse diameter, distal ascending 3.5 cm, distal arch 3.1 cm, proximal ascending 3.2 cm, mid descending 4.3 cm at the level of the left inferior pulmonary vein (stable by my measurement) , tapering to a diameter of 3.3 cm just above the diaphragm. There is some eccentric mural thrombus in the aneurysmal segment. No dissection or stenosis. Classic 3 vessel brachiocephalic arterial origin anatomy without proximal stenosis.  No pleural or pericardial effusion. Subcentimeter prevascular, AP window, and right paratracheal lymph nodes. No hilar adenopathy. Extensive emphysematous changes throughout both lungs. 7 mm nodule left lung apex, previously 9 mm. 12 mm nodule, posterior right apex, stable. 8 mm subpleural nodule, posterolateral left lower lobe image 47/5, previously 7 mm.  Coarse  subpleural airspace opacities in the posterior right lower lobe and basilar segments of both lower lobes, and medially in the lingula, new since previous study. Degenerative changes in the lower thoracic spine. Sternum intact.    Medical Decision Making  THORNTON DOHRMANN is a 84 y.o. male who is s/p open juxtarenal aneurysm repair with a straight graft 24 mm in diameter on 11/08/2014.  He also has known ectasia of the descending thoracic aorta from a CTA chest done on 09-17-14, at that time 4.3 cm in diameter.   Pt denies abdominal pain, denies chest pain. He has a history lumbar disc issues, but this has not bothered him lately.   Referral to Dr. Cyndia Bent to monitor thoracic aortic ectasia; pt's daughter is seeing Dr. Cyndia Bent in about 4 weeks for the same diagnosis.  I discussed with Dr. Oneida Alar that pt has known thoracic ectasia from previous chest/abd/pelvis CTA in 2015, and that his last serum creatinine result on file was 1.59 on 07-25-17. Will order follow up chest CT, non contrast, for Dr. Cyndia Bent to review, to be done in 3-4 weeks.  Pt's daughter is seeing Dr. Cyndia Bent for thoracic aneurysm.    The next ABI will be scheduled for 18 months.  The patient will follow up with [place] in 18 months with these studies.  Will not need to check carotid arteries for another 4 years.   I discussed in depth with the patient the nature of atherosclerosis, and emphasized the importance of maximal medical management including strict control of blood pressure, blood glucose, and lipid levels, obtaining regular exercise, and cessation of smoking.    The patient is aware that without maximal medical management the underlying atherosclerotic disease process will progress, limiting the benefit of any interventions.   Thank you for allowing [place] to participate in this patient's care.  Clemon Chambers, RN, MSN, FNP-C [facility] [place] Office: [facility] Physician:  Oneida Alar   06/12/2018, 11:45 AM

## 2018-06-12 NOTE — Progress Notes (Signed)
Vitals:   06/12/18 1133 06/12/18 1136  BP: (!) 142/74 129/72  Pulse: (!) 57 (!) 57  Resp: 16   Temp: (!) 96.9 F (36.1 C)   TempSrc: Oral   SpO2: 98%   Weight: 185 lb (83.9 kg)   Height: 6' (1.829 m)

## 2018-06-30 ENCOUNTER — Other Ambulatory Visit: Payer: Medicare Other

## 2018-07-04 ENCOUNTER — Ambulatory Visit
Admission: RE | Admit: 2018-07-04 | Discharge: 2018-07-04 | Disposition: A | Discharge status: Home / Self Care | Payer: Medicare Other | Source: Ambulatory Visit | Attending: Vascular Surgery | Admitting: Vascular Surgery

## 2018-07-04 DIAGNOSIS — I77810 Thoracic aortic ectasia: Secondary | ICD-10-CM

## 2018-07-09 ENCOUNTER — Encounter: Payer: Medicare Other | Admitting: Cardiothoracic Surgery

## 2018-07-23 ENCOUNTER — Ambulatory Visit: Payer: Medicare Other | Admitting: Podiatry

## 2018-07-23 ENCOUNTER — Encounter: Payer: Self-pay | Admitting: Podiatry

## 2018-07-23 DIAGNOSIS — D689 Coagulation defect, unspecified: Secondary | ICD-10-CM | POA: Diagnosis not present

## 2018-07-23 DIAGNOSIS — B351 Tinea unguium: Secondary | ICD-10-CM

## 2018-07-23 DIAGNOSIS — E1142 Type 2 diabetes mellitus with diabetic polyneuropathy: Secondary | ICD-10-CM

## 2018-07-23 DIAGNOSIS — M79676 Pain in unspecified toe(s): Secondary | ICD-10-CM

## 2018-07-23 NOTE — Progress Notes (Signed)
Patient ID: David [name], male   DOB: [birth year], 84 y.o.   MRN: [id number] Complaint:  Visit Type: Patient returns to my office for continued preventative foot care services. Complaint: Patient states" my nails have grown long and thick and become painful to walk and wear shoes" Patient has been diagnosed with DM neuropathy and angiopathy.. The patient presents for preventative foot care services. No changes to ROS.  Patient is taking plavix.  Podiatric Exam: Vascular: dorsalis pedis and posterior tibial pulses are weakly  palpable bilateral. Capillary return is immediate. Temperature gradient is WNL. Skin turgor WNL Venous staisis B/L. Sensorium: Absent  Semmes Weinstein monofilament test. Normal tactile sensation bilaterally. Nail Exam: Pt has thick disfigured discolored nails with subungual debris noted bilateral entire nail hallux through fifth toenails Ulcer Exam: There is no evidence of ulcer or pre-ulcerative changes or infection. Orthopedic Exam: Muscle tone and strength are WNL. No limitations in general ROM. No crepitus or effusions noted. Foot type and digits show no abnormalities. HAV  B/L Skin:  . No infection or ulcers.  Asymptomatic porokeratosis  Diagnosis:  Onychomycosis, , Pain in right toe, pain in left toes,   Treatment & Plan Procedures and Treatment: Consent by patient was obtained for treatment procedures. The patient understood the discussion of treatment and procedures well. All questions were answered thoroughly reviewed. Debridement of mycotic and hypertrophic toenails, 1 through 5 bilateral and clearing of subungual debris. No ulceration, no infection noted.  Return Visit-Office Procedure: Patient instructed to return to the office for a follow up visit 10 weeks  for continued evaluation and treatment.   Gardiner Barefoot DPM

## 2018-07-30 ENCOUNTER — Other Ambulatory Visit: Payer: Self-pay

## 2018-07-30 ENCOUNTER — Encounter: Payer: Self-pay | Admitting: Surgery

## 2018-07-30 ENCOUNTER — Institutional Professional Consult (permissible substitution): Payer: Medicare Other | Admitting: Surgery

## 2018-07-30 VITALS — BP 125/69 | HR 54 | Resp 16 | Ht 72.0 in | Wt 188.0 lb

## 2018-07-30 DIAGNOSIS — I7123 Aneurysm of the descending thoracic aorta, without rupture: Secondary | ICD-10-CM

## 2018-07-30 DIAGNOSIS — I712 Thoracic aortic aneurysm, without rupture: Secondary | ICD-10-CM

## 2018-08-01 ENCOUNTER — Encounter: Payer: Self-pay | Admitting: Surgery

## 2018-08-01 NOTE — Progress Notes (Signed)
Cardiothoracic Surgery Consultation  PCP is [name], MD Referring Provider is Nickel, Sharmon Leyden, NP  Chief Complaint  Patient presents with  . Thoracic Aortic Aneurysm    Surgical eval, Chest CT 07/04/2018,  ? last ECHO 06/14/12    HPI:  The patient is an 84 year old gentleman with a history of hypertension, hyperlipidemia, diabetes, COPD, stage III chronic kidney disease, remote stroke, colon and thyroid cancer and AAA who underwent open juxtarenal aneurysm repair by Dr. early in 2015.  He had known ectasia of the descending thoracic aorta from prior chest CTA done in 08/2014 at which time the descending aorta measured 4.3 cm in diameter.  He was seen in Dr. Luther Parody office in June for scheduled follow-up and a repeat CT of the chest without contrast was ordered to follow-up on his descending aorta.  This showed the maximum diameter of the ascending aorta to be 3.8 cm.  The aortic arch measured 3.3 cm.  The distal descending thoracic aorta measured 4.1 cm.  This had not changed significantly since the prior exam in 2015.  Past Medical History:  Diagnosis Date  . AAA (abdominal aortic aneurysm) ([place])   . AAA (abdominal aortic aneurysm) ([place])   . AAA (abdominal aortic aneurysm) ([place])   . Atherosclerosis    pt denies this  . BPH (benign prostatic hyperplasia)   . CKD (chronic kidney disease), stage III [facility])    family reports caused by scans but that has resolved since CT scans have stopped  . Colon cancer ([place])   . COPD (chronic obstructive pulmonary disease) ([place])   . Diabetes mellitus   . ED (erectile dysfunction)   . Gastroesophageal reflux disease   . Heart murmur   . Hyperlipidemia   . Hypertension   . Lung nodules   . Neuromuscular disorder (H[facility])    Neuropathy feet  . Osteopenia   . Peripheral vascular disease ([place])   . Pneumonia June 2013  . Positional vertigo    intermittent   . Shortness of breath    chronic per patient  . Stroke ([place])    x2 in 2006  . Stroke  ([place])   . Thyroid cancer ([place])   . Thyroid disease   . Type 2 diabetes mellitus ([place])   . Vitamin D deficiency     Past Surgical History:  Procedure Laterality Date  . ABDOMINAL AORTIC ANEURYSM REPAIR N/A 11/08/2014   Procedure: ANEURYSM ABDOMINAL AORTIC REPAIR;  Surgeon: Rosetta Posner, MD;  Location: [employer];  Service: Vascular;  Laterality: N/A;  . ABDOMINAL AORTIC ANEURYSM REPAIR    . COLON RESECTION  2007  . COLON SURGERY  2006   Resection  . SEPTOPLASTY    . THYROIDECTOMY  2007  . TRANSURETHRAL RESECTION OF BLADDER TUMOR WITH MITOMYCIN-C N/A 02/12/2017   Procedure: TRANSURETHRAL RESECTION OF BLADDER TUMOR;  Surgeon: Irine Seal, MD;  Location: [facility];  Service: Urology;  Laterality: N/A;    Family History  Problem Relation Age of Onset  . Stomach cancer Mother   . Heart failure Mother   . Breast cancer Mother   . Pneumonia Father   . Diabetes Brother   . Heart disease Brother        Heart Disease before age 82,  AAA and  Carotid  . Heart attack Brother   . Diabetes Paternal Uncle     Social History Social History   Tobacco Use  . Smoking status: Former Smoker    Packs/day: 1.50  Years: 50.00    Pack years: 75.00    Types: Cigarettes    Last attempt to quit: 05/28/2005    Years since quitting: 13.1  . Smokeless tobacco: Never Used  Substance Use Topics  . Alcohol use: No    Alcohol/week: 0.0 standard drinks  . Drug use: No    Current Outpatient Medications  Medication Sig Dispense Refill  . acetaminophen (TYLENOL) 500 MG tablet Take 500-1,000 mg by mouth at bedtime. Take 500 mg daily at bedtime and may take an additional 500mg  tablet if needed for pain    . clopidogrel (PLAVIX) 75 MG tablet Take 75 mg by mouth at bedtime.     . Fluticasone-Salmeterol (ADVAIR) 250-50 MCG/DOSE AEPB Inhale 1 puff into the lungs every 12 (twelve) hours.    . insulin lispro (HUMALOG KWIKPEN) 100 UNIT/ML KiwkPen Inject 3 Units into the skin 2 (two) times daily. Before breakfast &  before supper    . Ipratropium-Albuterol (COMBIVENT IN) Inhale 1 puff into the lungs 2 (two) times daily.     Marland Kitchen ipratropium-albuterol (DUONEB) 0.5-2.5 (3) MG/3ML SOLN Take 3 mLs by nebulization every 12 (twelve) hours as needed (shortness of breath/cough/wheezing).     Ernest Mallick FLEXTOUCH 100 UNIT/ML Pen Inject 6 Units into the skin at bedtime.     Marland Kitchen levothyroxine (SYNTHROID, LEVOTHROID) 125 MCG tablet Take 125 mcg by mouth daily before breakfast.     . losartan-hydrochlorothiazide (HYZAAR) 100-25 MG per tablet Take 0.5 tablets by mouth daily.     . mirtazapine (REMERON SOL-TAB) 15 MG disintegrating tablet Take 15 mg by mouth at bedtime.    . rosuvastatin (CRESTOR) 20 MG tablet Take 10 mg by mouth at bedtime.     . triamcinolone cream (KENALOG) 0.1 % Apply 1 application topically 2 (two) times daily as needed (for rash/skin irritation.).     Marland Kitchen Vitamin D, Ergocalciferol, (DRISDOL) 50000 UNITS CAPS Take 50,000 Units by mouth every Monday.      Current Facility-Administered Medications  Medication Dose Route Frequency Provider Last Rate Last Dose  . 0.9 %  sodium chloride infusion  500 mL Intravenous Continuous Irene Shipper, MD        Allergies  Allergen Reactions  . Azithromycin Rash    zpack  . Doxycycline Rash    Review of Systems  Constitutional: Positive for activity change. Negative for fatigue.  HENT: Positive for hearing loss.        Last saw dentist in February 2019  Eyes: Negative.   Respiratory: Positive for cough, shortness of breath and wheezing.   Cardiovascular: Negative for chest pain, palpitations and leg swelling.       Positive orthopnea  Gastrointestinal:       Difficulty swallowing  Endocrine: Negative.   Genitourinary:       BPH  Musculoskeletal:       Difficulty walking  Skin: Negative.   Allergic/Immunologic: Negative.   Neurological:       Peripheral neuropathy in feet. Prior stroke x2 in 2006.  Hematological: Bruises/bleeds easily.    Psychiatric/Behavioral: Negative.     BP 125/69   Pulse (!) 54   Resp 16   Ht 6' (1.829 m)   Wt 188 lb (85.3 kg)   SpO2 96% Comment: RA  BMI 25.50 kg/m  Physical Exam  Constitutional: He is oriented to person, place, and time. He appears well-developed and well-nourished.  HENT:  Head: Normocephalic and atraumatic.  Mouth/Throat: Oropharynx is clear and moist.  Eyes: Pupils are equal,  round, and reactive to light. EOM are normal.  Neck: Normal range of motion. Neck supple. No thyromegaly present.  Cardiovascular: Normal rate, regular rhythm and normal heart sounds.  No murmur heard. Pulmonary/Chest: Effort normal and breath sounds normal. No respiratory distress.  Abdominal: Soft. Bowel sounds are normal. He exhibits no distension. There is no tenderness.  Musculoskeletal: Normal range of motion. He exhibits no edema.  Lymphadenopathy:    He has no cervical adenopathy.  Neurological: He is alert and oriented to person, place, and time. He has normal strength. No cranial nerve deficit.  Skin: Skin is warm and dry.  Psychiatric: He has a normal mood and affect.     Diagnostic Tests:  CLINICAL DATA:  Thoracic aortic aneurysm.  EXAM: CT CHEST WITHOUT CONTRAST  TECHNIQUE: Multidetector CT imaging of the chest was performed following the standard protocol without IV contrast.  COMPARISON:  CT scan of September 21, 2014.  FINDINGS: Cardiovascular: Atherosclerosis of thoracic aorta is noted. Ascending thoracic aorta has maximum measured diameter of 3.8 cm. Transverse thoracic aorta measures 3.3 cm. Distal descending thoracic aorta has maximum measured diameter 4.1 cm. This is not significantly changed compared to prior exam. Mild coronary calcifications are noted. Normal cardiac size. No pericardial effusion is noted.  Mediastinum/Nodes: Thyroid gland and esophagus are unremarkable. Calcified precarinal and right hilar lymph nodes are noted consistent with prior  granulomatous disease.  Lungs/Pleura: No pneumothorax or pleural effusion is noted. Emphysematous disease is noted in the upper lobes bilaterally. Stable 1 cm nodule is noted in right upper lobe which can be considered benign at this point. Stable calcified granuloma is noted laterally in right upper lobe. Stable 8 mm nodular density is noted anteriorly in right upper lobe which can be considered benign at this point.  Upper Abdomen: Minimal cholelithiasis is noted.  Musculoskeletal: No chest wall mass or suspicious bone lesions identified.  IMPRESSION: Stable 4.1 cm descending thoracic aortic aneurysm. Recommend annual imaging followup by CTA or MRA. This recommendation follows 2010 ACCF/AHA/AATS/ACR/ASA/SCA/SCAI/SIR/STS/SVM Guidelines for the Diagnosis and Management of Patients with Thoracic Aortic Disease. Circulation. 2010; 121: E831-D176.  Mild coronary artery calcifications are noted suggesting coronary artery disease.  Calcified mediastinal adenopathy is noted most consistent with prior granulomatous disease.  Aortic Atherosclerosis (ICD10-I70.0) and Emphysema (ICD10-J43.9).   Electronically Signed   By: Marijo Conception, M.D.   On: 07/04/2018 09:54   Impression:  This 84 year old gentleman has a 4.1 cm fusiform descending thoracic aortic aneurysm.  His ascending aorta measures 3.8 cm and the aortic arch is 3.3 cm.  This is not changed since his CT scan in 2015.  It is still well below the surgical threshold but should be followed periodically as long as he is doing well.  I reviewed the CT images with him and his family and answered their questions.  Plan:  I will see him back in 1 year with a CT of the chest without contrast.  I spent 45 minutes performing this consultation and > 50% of this time was spent face to face counseling and coordinating the care of this patient's descending thoracic aortic aneurysm.   Gaye Pollack, MD [facility] [phone]

## 2018-08-04 DIAGNOSIS — M779 Enthesopathy, unspecified: Secondary | ICD-10-CM | POA: Insufficient documentation

## 2018-09-26 ENCOUNTER — Encounter: Payer: Self-pay | Admitting: Podiatry

## 2018-09-26 ENCOUNTER — Ambulatory Visit: Payer: Medicare Other | Admitting: Podiatry

## 2018-09-26 DIAGNOSIS — B351 Tinea unguium: Secondary | ICD-10-CM | POA: Diagnosis not present

## 2018-09-26 DIAGNOSIS — E1142 Type 2 diabetes mellitus with diabetic polyneuropathy: Secondary | ICD-10-CM | POA: Diagnosis not present

## 2018-09-26 DIAGNOSIS — M2041 Other hammer toe(s) (acquired), right foot: Secondary | ICD-10-CM

## 2018-09-26 DIAGNOSIS — D689 Coagulation defect, unspecified: Secondary | ICD-10-CM | POA: Diagnosis not present

## 2018-09-26 DIAGNOSIS — M2012 Hallux valgus (acquired), left foot: Secondary | ICD-10-CM

## 2018-09-26 DIAGNOSIS — M2042 Other hammer toe(s) (acquired), left foot: Secondary | ICD-10-CM

## 2018-09-26 DIAGNOSIS — M79676 Pain in unspecified toe(s): Secondary | ICD-10-CM

## 2018-09-26 DIAGNOSIS — M2011 Hallux valgus (acquired), right foot: Secondary | ICD-10-CM | POA: Diagnosis not present

## 2018-09-26 NOTE — Progress Notes (Addendum)
Patient ID: David [name], male   DOB: Dec 11, 1931, 84 y.o.   MRN: [id number] Complaint:  Visit Type: Patient returns to my office for continued preventative foot care services. Complaint: Patient states" my nails have grown long and thick and become painful to walk and wear shoes" Patient has been diagnosed with DM neuropathy and angiopathy.. The patient presents for preventative foot care services. No changes to ROS.  Patient is taking plavix.  Podiatric Exam: Vascular: dorsalis pedis and posterior tibial pulses are weakly  palpable bilateral. Capillary return is immediate. Temperature gradient is WNL. Skin turgor WNL Venous staisis B/L. Sensorium: Absent  Semmes Weinstein monofilament test. Absent  tactile sensation bilaterally. Nail Exam: Pt has thick disfigured discolored nails with subungual debris noted bilateral entire nail hallux through fifth toenails Ulcer Exam: There is no evidence of ulcer or pre-ulcerative changes or infection. Orthopedic Exam: Muscle tone and strength are WNL. No limitations in general ROM. No crepitus or effusions noted. Foot type and digits show no abnormalities. HAV  B/L.  Hammer toes  B/L Skin:  . No infection or ulcers.  Asymptomatic porokeratosis sub 5th  B/L  Diagnosis:  Onychomycosis, , Pain in right toe, pain in left toes,  DPN,  HAV  Hammer toes  B/L  Treatment & Plan Procedures and Treatment: Consent by patient was obtained for treatment procedures. The patient understood the discussion of treatment and procedures well. All questions were answered thoroughly reviewed. Debridement of mycotic and hypertrophic toenails, 1 through 5 bilateral and clearing of subungual debris. No ulceration, no infection noted. Patient to be seen by Liliane Channel for diabetic shoes due to DPN HAV and hammer toes. Return Visit-Office Procedure: Patient instructed to return to the office for a follow up visit 10 weeks  for continued evaluation and treatment.   Gardiner Barefoot DPM

## 2018-10-13 ENCOUNTER — Ambulatory Visit: Payer: Medicare Other | Admitting: Orthotics

## 2018-10-13 DIAGNOSIS — Q6650 Congenital pes planus, unspecified foot: Secondary | ICD-10-CM

## 2018-10-13 DIAGNOSIS — M2042 Other hammer toe(s) (acquired), left foot: Secondary | ICD-10-CM

## 2018-10-13 DIAGNOSIS — E1142 Type 2 diabetes mellitus with diabetic polyneuropathy: Secondary | ICD-10-CM

## 2018-10-13 DIAGNOSIS — M2011 Hallux valgus (acquired), right foot: Secondary | ICD-10-CM

## 2018-10-13 DIAGNOSIS — M2012 Hallux valgus (acquired), left foot: Secondary | ICD-10-CM

## 2018-10-13 DIAGNOSIS — M2041 Other hammer toe(s) (acquired), right foot: Secondary | ICD-10-CM

## 2018-10-13 NOTE — Progress Notes (Signed)

## 2018-11-13 ENCOUNTER — Ambulatory Visit (INDEPENDENT_AMBULATORY_CARE_PROVIDER_SITE_OTHER): Payer: Medicare Other | Admitting: Orthotics

## 2018-11-13 DIAGNOSIS — M2042 Other hammer toe(s) (acquired), left foot: Secondary | ICD-10-CM | POA: Diagnosis not present

## 2018-11-13 DIAGNOSIS — M2012 Hallux valgus (acquired), left foot: Secondary | ICD-10-CM

## 2018-11-13 DIAGNOSIS — M2041 Other hammer toe(s) (acquired), right foot: Secondary | ICD-10-CM | POA: Diagnosis not present

## 2018-11-13 DIAGNOSIS — E1142 Type 2 diabetes mellitus with diabetic polyneuropathy: Secondary | ICD-10-CM

## 2018-11-13 DIAGNOSIS — Q6650 Congenital pes planus, unspecified foot: Secondary | ICD-10-CM

## 2018-11-13 DIAGNOSIS — M2011 Hallux valgus (acquired), right foot: Secondary | ICD-10-CM | POA: Diagnosis not present

## 2018-11-13 NOTE — Progress Notes (Signed)

## 2018-12-31 ENCOUNTER — Ambulatory Visit: Payer: Medicare Other | Admitting: Podiatry

## 2018-12-31 ENCOUNTER — Encounter: Payer: Self-pay | Admitting: Podiatry

## 2018-12-31 ENCOUNTER — Encounter

## 2018-12-31 DIAGNOSIS — D689 Coagulation defect, unspecified: Secondary | ICD-10-CM | POA: Diagnosis not present

## 2018-12-31 DIAGNOSIS — E1142 Type 2 diabetes mellitus with diabetic polyneuropathy: Secondary | ICD-10-CM

## 2018-12-31 DIAGNOSIS — B351 Tinea unguium: Secondary | ICD-10-CM

## 2018-12-31 DIAGNOSIS — M79676 Pain in unspecified toe(s): Secondary | ICD-10-CM

## 2018-12-31 NOTE — Progress Notes (Signed)
Patient ID: David [name], male   DOB: 1931/06/13, 85 y.o.   MRN: [id number] Complaint:  Visit Type: Patient returns to my office for continued preventative foot care services. Complaint: Patient states" my nails have grown long and thick and become painful to walk and wear shoes" Patient has been diagnosed with DM neuropathy and angiopathy.. The patient presents for preventative foot care services. No changes to ROS.  Patient is taking plavix.  Patient likes his diabetic shoes.  Podiatric Exam: Vascular: dorsalis pedis and posterior tibial pulses are weakly  palpable bilateral. Capillary return is immediate. Temperature gradient is WNL. Skin turgor WNL Venous staisis B/L. Sensorium: Absent  Semmes Weinstein monofilament test. Absent  tactile sensation bilaterally. Nail Exam: Pt has thick disfigured discolored nails with subungual debris noted bilateral entire nail hallux through fifth toenails Ulcer Exam: There is no evidence of ulcer or pre-ulcerative changes or infection. Orthopedic Exam: Muscle tone and strength are WNL. No limitations in general ROM. No crepitus or effusions noted. Foot type and digits show no abnormalities. HAV  B/L.  Hammer toes  B/L Skin:  . No infection or ulcers.  Asymptomatic porokeratosis sub 5th  B/L  Diagnosis:  Onychomycosis, , Pain in right toe, pain in left toes,  DPN,  HAV  Hammer toes  B/L  Treatment & Plan Procedures and Treatment: Consent by patient was obtained for treatment procedures. The patient understood the discussion of treatment and procedures well. All questions were answered thoroughly reviewed. Debridement of mycotic and hypertrophic toenails, 1 through 5 bilateral and clearing of subungual debris. No ulceration, no infection noted.  Return Visit-Office Procedure: Patient instructed to return to the office for a follow up visit 10 weeks  for continued evaluation and treatment.   Gardiner Barefoot DPM

## 2019-03-20 ENCOUNTER — Ambulatory Visit: Payer: Medicare Other | Admitting: Podiatry

## 2019-06-25 ENCOUNTER — Other Ambulatory Visit: Payer: Self-pay | Admitting: *Deleted

## 2019-06-25 DIAGNOSIS — I712 Thoracic aortic aneurysm, without rupture, unspecified: Secondary | ICD-10-CM

## 2019-08-12 ENCOUNTER — Ambulatory Visit: Payer: Medicare Other | Admitting: Surgery

## 2019-08-12 ENCOUNTER — Encounter: Payer: Self-pay | Admitting: Surgery

## 2019-08-12 ENCOUNTER — Ambulatory Visit
Admission: RE | Admit: 2019-08-12 | Discharge: 2019-08-12 | Disposition: A | Discharge status: Home / Self Care | Payer: Medicare Other | Source: Ambulatory Visit | Attending: Surgery | Admitting: Surgery

## 2019-08-12 ENCOUNTER — Other Ambulatory Visit: Payer: Self-pay

## 2019-08-12 VITALS — BP 158/65 | HR 74 | Temp 97.8°F | Resp 20 | Ht 72.0 in | Wt 187.0 lb

## 2019-08-12 DIAGNOSIS — I712 Thoracic aortic aneurysm, without rupture, unspecified: Secondary | ICD-10-CM

## 2019-08-12 DIAGNOSIS — I7123 Aneurysm of the descending thoracic aorta, without rupture: Secondary | ICD-10-CM

## 2019-08-12 NOTE — Progress Notes (Signed)
HPI:  The patient is an 85 year old gentleman with a history of hypertension, hyperlipidemia, diabetes, COPD, stage III chronic kidney disease, remote stroke, colon and thyroid cancer and AAA who underwent open juxtarenal aneurysm repair by Dr. early in 2015.  He returns for follow-up of a fusiform descending thoracic aortic aneurysm that measured 4.1 cm on CT scan 1 year ago.  He has been feeling well and has remained active.  Current Outpatient Medications  Medication Sig Dispense Refill  . acetaminophen (TYLENOL) 500 MG tablet Take 500-1,000 mg by mouth at bedtime. Take 500 mg daily at bedtime and may take an additional 500mg  tablet if needed for pain    . clopidogrel (PLAVIX) 75 MG tablet Take 75 mg by mouth at bedtime.     . Fluticasone-Salmeterol (ADVAIR) 250-50 MCG/DOSE AEPB Inhale 1 puff into the lungs every 12 (twelve) hours.    . insulin lispro (HUMALOG KWIKPEN) 100 UNIT/ML KiwkPen Inject 3 Units into the skin 2 (two) times daily. Before breakfast & before supper    . Ipratropium-Albuterol (COMBIVENT IN) Inhale 1 puff into the lungs 2 (two) times daily.     Marland Kitchen ipratropium-albuterol (DUONEB) 0.5-2.5 (3) MG/3ML SOLN Take 3 mLs by nebulization every 12 (twelve) hours as needed (shortness of breath/cough/wheezing).     Ernest Mallick FLEXTOUCH 100 UNIT/ML Pen Inject 6 Units into the skin at bedtime.     Marland Kitchen levothyroxine (SYNTHROID, LEVOTHROID) 125 MCG tablet Take 125 mcg by mouth daily before breakfast.     . mirtazapine (REMERON SOL-TAB) 15 MG disintegrating tablet Take 15 mg by mouth at bedtime.    . rosuvastatin (CRESTOR) 20 MG tablet Take 10 mg by mouth at bedtime.     . triamcinolone cream (KENALOG) 0.1 % Apply 1 application topically 2 (two) times daily as needed (for rash/skin irritation.).     Marland Kitchen valsartan-hydrochlorothiazide (DIOVAN-HCT) 160-12.5 MG tablet Take 1 tablet by mouth daily.    . Vitamin D, Ergocalciferol, (DRISDOL) 50000 UNITS CAPS Take 50,000 Units by mouth every Monday.       Current Facility-Administered Medications  Medication Dose Route Frequency Provider Last Rate Last Dose  . 0.9 %  sodium chloride infusion  500 mL Intravenous Continuous Irene Shipper, MD         Physical Exam: BP (!) 158/65   Pulse 74   Temp 97.8 F (36.6 C) (Skin)   Resp 20   Ht 6' (1.829 m)   Wt 187 lb (84.8 kg)   SpO2 93% Comment: RA  BMI 25.36 kg/m  He looks well. Cardiac exam shows regular rate and rhythm with normal heart sounds.  There is no murmur. Lungs are clear.  Diagnostic Tests:  CLINICAL DATA:  85 year old male with known thoracic aortic aneurysm presenting for follow-up. History of abdominal aortic aneurysm repair as well as history of colon cancer.  EXAM: CT CHEST WITHOUT CONTRAST  TECHNIQUE: Multidetector CT imaging of the chest was performed following the standard protocol without IV contrast.  COMPARISON:  Chest CT dated 07/04/2018.  FINDINGS: Evaluation of this exam is limited in the absence of intravenous contrast.  Cardiovascular: There is no cardiomegaly or pericardial effusion. Three-vessel coronary vascular calcification. There is moderate to advanced atherosclerotic calcification of the thoracic aorta. The thoracic aorta is mildly tortuous. No significant interval change in the aneurysmal dilatation of the descending thoracic aorta which measures up to approximately 4 cm in diameter. Partially visualized repair of abdominal aortic aneurysm. The central pulmonary arteries are unremarkable on this  noncontrast CT.  Mediastinum/Nodes: No hilar or mediastinal adenopathy. Calcified lymph nodes/granuloma. The esophagus is grossly unremarkable. No mediastinal fluid collection.  Lungs/Pleura: Moderate centrilobular emphysema. A 7 mm left lower lobe subpleural nodule similar to prior CT. Small bilateral upper lobe calcified granulomas noted. An 8 mm right upper lobe nodular density or scarring is similar to prior CT (series 8,  image 74). Partially calcified right apical granuloma measures 10 mm similar to prior CT. There is no pleural effusion or pneumothorax. Biapical subpleural scarring. No lobar consolidation. The central airways are patent.  Upper Abdomen: Stable 3.5 cm left renal upper pole hypodense lesion, likely a cyst. Multiple gallstones.  Musculoskeletal: Osteopenia with degenerative changes of the spine. No acute osseous pathology.  IMPRESSION: 1. No acute intrathoracic pathology. 2. Stable aneurysmal dilatation of the descending thoracic aorta measuring up to approximately 4 cm in diameter. Recommend semi-annual imaging followup by CTA or MRA and referral to cardiothoracic surgery if not already obtained. This recommendation follows 83 0 ACCF/AHA/AATS/ACR/ASA/SCA/SCAI/SIR/STS/SVM Guidelines for the Diagnosis and Management of Patients With Thoracic Aortic Disease. Circulation. 2010; 121: P619-J09. Aortic aneurysm NOS (ICD10-I71.9) 3. Small bilateral pulmonary nodules similar to prior CT. 4. Aortic Atherosclerosis (ICD10-I70.0) and Emphysema (ICD10-J43.9).   Electronically Signed   By: Anner Crete M.D.   On: 08/12/2019 12:38   Impression:  This 85 year old active gentleman has a stable 4 cm descending thoracic aortic aneurysm which is unchanged dating back to 2015.  This is well below the surgical threshold.  I reviewed the CTA images with the patient and his wife and answered their questions.  I stressed the importance of good blood pressure control and preventing further enlargement and acute aortic dissection.  Despite his age he looks quite good and remains active so I think it is worthwhile checking this again in a year in case he develops a saccular component.  Plan:  He will return to see me in 1 year with a CT scan of the chest without contrast.  I spent 15 minutes performing this established patient evaluation and > 50% of this time was spent face to face counseling  and coordinating the care of this patient's aortic aneurysm.    Gaye Pollack, MD [facility] [phone]

## 2019-10-21 ENCOUNTER — Other Ambulatory Visit: Payer: Self-pay

## 2019-10-21 ENCOUNTER — Encounter: Payer: Self-pay | Admitting: Podiatry

## 2019-10-21 ENCOUNTER — Ambulatory Visit: Payer: Medicare Other | Admitting: Podiatry

## 2019-10-21 ENCOUNTER — Other Ambulatory Visit: Payer: Self-pay | Admitting: Podiatry

## 2019-10-21 ENCOUNTER — Ambulatory Visit: Payer: Self-pay

## 2019-10-21 DIAGNOSIS — E1142 Type 2 diabetes mellitus with diabetic polyneuropathy: Secondary | ICD-10-CM

## 2019-10-21 DIAGNOSIS — S99922A Unspecified injury of left foot, initial encounter: Secondary | ICD-10-CM

## 2019-10-21 DIAGNOSIS — B351 Tinea unguium: Secondary | ICD-10-CM

## 2019-10-21 DIAGNOSIS — M799 Soft tissue disorder, unspecified: Secondary | ICD-10-CM

## 2019-10-21 DIAGNOSIS — D689 Coagulation defect, unspecified: Secondary | ICD-10-CM | POA: Diagnosis not present

## 2019-10-21 DIAGNOSIS — M79676 Pain in unspecified toe(s): Secondary | ICD-10-CM

## 2019-10-21 NOTE — Progress Notes (Signed)
Patient ID: David [name], male   DOB: Jun 30, 1931, 85 y.o.   MRN: [id number] Complaint:  Visit Type: Patient returns to my office for continued preventative foot care services. Complaint: Patient states" my nails have grown long and thick and become painful to walk and wear shoes" Patient has been diagnosed with DM neuropathy and angiopathy.. The patient presents for preventative foot care services. No changes to ROS.  Patient is taking plavix. Patient says he has developed a non painful knot on the inside big roe left foot.  He discussed this knot with his PCP who believes it may be a tophus.  Virtual visit with PCP>  Podiatric Exam: Vascular: dorsalis pedis and posterior tibial pulses are weakly  palpable bilateral. Capillary return is immediate. Temperature gradient is WNL. Skin turgor WNL Venous staisis B/L. Sensorium: Absent  Semmes Weinstein monofilament test. Absent  tactile sensation bilaterally. Nail Exam: Pt has thick disfigured discolored nails with subungual debris noted bilateral entire nail hallux through fifth toenails Ulcer Exam: There is no evidence of ulcer or pre-ulcerative changes or infection. Orthopedic Exam: Muscle tone and strength are WNL. No limitations in general ROM. No crepitus or effusions noted. Foot type and digits show no abnormalities. HAV  B/L.  Hammer toes  B/L  Hard non-movable cyst noted at the medial aspect IPJ left foot. Skin:  . No infection or ulcers.  Asymptomatic porokeratosis sub 5th  B/L  Diagnosis:  Onychomycosis, , Pain in right toe, pain in left toes,  DPN,  HAV  Hammer toes  B/L.  Cyst/arthritis  left foot.  Treatment & Plan Procedures and Treatment: Consent by patient was obtained for treatment procedures. The patient understood the discussion of treatment and procedures well. All questions were answered thoroughly reviewed. Debridement of mycotic and hypertrophic toenails, 1 through 5 bilateral and clearing of subungual debris. No ulceration, no  infection noted.  Xrays taken reveal bony reactivity bone at medial aspect IPJ.  White cloudiness noted at site of cyst.  Discussed this with patient.  Told him there are arthritic changes at IPJ.  Told him to let this cystic lesion declare itself. Return Visit-Office Procedure: Patient instructed to return to the office for a follow up visit 10 weeks  for continued evaluation and treatment.   Gardiner Barefoot DPM

## 2019-12-10 ENCOUNTER — Other Ambulatory Visit: Payer: Self-pay

## 2019-12-10 DIAGNOSIS — I739 Peripheral vascular disease, unspecified: Secondary | ICD-10-CM

## 2019-12-11 ENCOUNTER — Encounter: Payer: Self-pay | Admitting: Family

## 2019-12-11 ENCOUNTER — Ambulatory Visit: Payer: Medicare Other | Admitting: Family

## 2019-12-11 ENCOUNTER — Other Ambulatory Visit: Payer: Self-pay

## 2019-12-11 ENCOUNTER — Ambulatory Visit (HOSPITAL_COMMUNITY)
Admission: RE | Admit: 2019-12-11 | Discharge: 2019-12-11 | Disposition: A | Discharge status: Home / Self Care | Payer: Medicare Other | Source: Ambulatory Visit | Attending: Family | Admitting: Family

## 2019-12-11 VITALS — BP 121/66 | HR 58 | Temp 97.0°F | Resp 16 | Ht 72.0 in | Wt 185.0 lb

## 2019-12-11 DIAGNOSIS — Z8679 Personal history of other diseases of the circulatory system: Secondary | ICD-10-CM | POA: Diagnosis not present

## 2019-12-11 DIAGNOSIS — I739 Peripheral vascular disease, unspecified: Secondary | ICD-10-CM | POA: Insufficient documentation

## 2019-12-11 DIAGNOSIS — Z9889 Other specified postprocedural states: Secondary | ICD-10-CM

## 2019-12-11 NOTE — Progress Notes (Signed)
[facility] [place]  History of Open AAA Repair  History of Present Illness  David [name] is a 85 y.o. ([birth year]) male who is s/p open juxtarenal aneurysm repair by Dr. Donnetta Hutching. He had a straight graft 24 mm in diameter. Surgery was on 11/08/2014.  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 Cyndia Bent is monitoring pt for this.   He reports intermittent neuropathy sensation in feet: tingling, numbness, burning. He denies slow healing wounds.  He also has a history lumbar disc issues, but this has not bothered him lately.  He is taking Plavix and a statin daily.  He stays physically active, no claudication symptoms.   He reports 2 strokes in 2006, wife states confirmed by MRI of his head. Carotid duplex in September 2015 showed <40% bilateral ICA stenoses.  He had bladder cancer, treated with small tumors removed and installation of chemo. He had thyroid cancer treated with radioiodine ablation, according to pt description.   Diabetic: Yes, Pt states his last A1C was 6.6 Tobacco use: former smoker, quit in 2006 when he was diagnosed with colon and thyroid cancer   Past Medical History:  Diagnosis Date  . AAA (abdominal aortic aneurysm) ([place])   . AAA (abdominal aortic aneurysm) ([place])   . AAA (abdominal aortic aneurysm) ([place])   . Atherosclerosis    pt denies this  . BPH (benign prostatic hyperplasia)   . CKD (chronic kidney disease), stage III    family reports caused by scans but that has resolved since CT scans have stopped  . Colon cancer ([place])   . COPD (chronic obstructive pulmonary disease) ([place])   . Diabetes mellitus   . ED (erectile dysfunction)   . Gastroesophageal reflux disease   . Heart murmur   . Hyperlipidemia   . Hypertension   . Lung nodules   . Neuromuscular disorder ([facility])    Neuropathy feet  . Osteopenia   . Peripheral vascular disease ([place])   . Pneumonia June 2013  . Positional vertigo    intermittent   .  Shortness of breath    chronic per patient  . Stroke ([place])    x2 in 2006  . Stroke ([place])   . Thyroid cancer ([place])   . Thyroid disease   . Type 2 diabetes mellitus ([place])   . Vitamin D deficiency     Past Surgical History:  Procedure Laterality Date  . ABDOMINAL AORTIC ANEURYSM REPAIR N/A 11/08/2014   Procedure: ANEURYSM ABDOMINAL AORTIC REPAIR;  Surgeon: Rosetta Posner, MD;  Location: [facility];  Service: Vascular;  Laterality: N/A;  . ABDOMINAL AORTIC ANEURYSM REPAIR    . COLON RESECTION  2007  . COLON SURGERY  2006   Resection  . SEPTOPLASTY    . THYROIDECTOMY  2007  . TRANSURETHRAL RESECTION OF BLADDER TUMOR WITH MITOMYCIN-C N/A 02/12/2017   Procedure: TRANSURETHRAL RESECTION OF BLADDER TUMOR;  Surgeon: Irine Seal, MD;  Location: [facility];  Service: Urology;  Laterality: N/A;   Social History Social History   Socioeconomic History  . Marital status: Married    Spouse name: Not on file  . Number of children: Not on file  . Years of education: Not on file  . Highest education level: Not on file  Occupational History  . Occupation: Retired    Fish farm manager: RETIRED  Tobacco Use  . Smoking status: Former Smoker    Packs/day: 1.50    Years: 50.00    Pack years: 75.00  Types: Cigarettes    Quit date: 05/28/2005    Years since quitting: 14.5  . Smokeless tobacco: Never Used  Substance and Sexual Activity  . Alcohol use: No    Alcohol/week: 0.0 standard drinks  . Drug use: No  . Sexual activity: Not Currently  Other Topics Concern  . Not on file  Social History Narrative  . Not on file   Social Determinants of Health   Financial Resource Strain:   . Difficulty of Paying Living Expenses: Not on file  Food Insecurity:   . Worried About Charity fundraiser in the Last Year: Not on file  . Ran Out of Food in the Last Year: Not on file  Transportation Needs:   . Lack of Transportation (Medical): Not on file  . Lack of Transportation (Non-Medical): Not on file  Physical  Activity:   . Days of Exercise per Week: Not on file  . Minutes of Exercise per Session: Not on file  Stress:   . Feeling of Stress : Not on file  Social Connections:   . Frequency of Communication with Friends and Family: Not on file  . Frequency of Social Gatherings with Friends and Family: Not on file  . Attends Religious Services: Not on file  . Active Member of Clubs or Organizations: Not on file  . Attends Archivist Meetings: Not on file  . Marital Status: Not on file  Intimate Partner Violence:   . Fear of Current or Ex-Partner: Not on file  . Emotionally Abused: Not on file  . Physically Abused: Not on file  . Sexually Abused: Not on file   Family History Family History  Problem Relation Age of Onset  . Stomach cancer Mother   . Heart failure Mother   . Breast cancer Mother   . Pneumonia Father   . Diabetes Brother   . Heart disease Brother        Heart Disease before age 14,  AAA and  Carotid  . Heart attack Brother   . Diabetes Paternal Uncle    Current Outpatient Medications on File Prior to Visit  Medication Sig Dispense Refill  . acetaminophen (TYLENOL) 500 MG tablet Take 500-1,000 mg by mouth at bedtime. Take 500 mg daily at bedtime and may take an additional 500mg  tablet if needed for pain    . azelastine (OPTIVAR) 0.05 % ophthalmic solution Apply 1 drop to eye 2 (two) times daily.    . clopidogrel (PLAVIX) 75 MG tablet Take 75 mg by mouth at bedtime.     . Fluticasone-Salmeterol (ADVAIR) 250-50 MCG/DOSE AEPB Inhale 1 puff into the lungs every 12 (twelve) hours.    . insulin lispro (HUMALOG KWIKPEN) 100 UNIT/ML KiwkPen Inject 3 Units into the skin 2 (two) times daily. Before breakfast & before supper    . Ipratropium-Albuterol (COMBIVENT IN) Inhale 1 puff into the lungs 2 (two) times daily.     Marland Kitchen ipratropium-albuterol (DUONEB) 0.5-2.5 (3) MG/3ML SOLN Take 3 mLs by nebulization every 12 (twelve) hours as needed (shortness of breath/cough/wheezing).      Ernest Mallick FLEXTOUCH 100 UNIT/ML Pen Inject 6 Units into the skin at bedtime.     Marland Kitchen levothyroxine (SYNTHROID, LEVOTHROID) 125 MCG tablet Take 125 mcg by mouth daily before breakfast.     . mirtazapine (REMERON SOL-TAB) 15 MG disintegrating tablet Take 15 mg by mouth at bedtime.    . moxifloxacin (VIGAMOX) 0.5 % ophthalmic solution Place 1 drop into the left eye 4 (four)  times daily.    Glory Rosebush ULTRA test strip     . rosuvastatin (CRESTOR) 10 MG tablet Take 10 mg by mouth at bedtime.    . valsartan-hydrochlorothiazide (DIOVAN-HCT) 80-12.5 MG tablet Take 1 tablet by mouth daily.    . Vitamin D, Ergocalciferol, (DRISDOL) 50000 UNITS CAPS Take 50,000 Units by mouth every Monday.      Current Facility-Administered Medications on File Prior to Visit  Medication Dose Route Frequency Provider Last Rate Last Admin  . 0.9 %  sodium chloride infusion  500 mL Intravenous Continuous Irene Shipper, MD       Allergies  Allergen Reactions  . Azithromycin Rash    zpack  . Doxycycline Rash    ROS: See HPI for pertinent positives and negatives.    Physical Examination  Vitals:   12/11/19 1139 12/11/19 1143  BP: 122/67 121/66  Pulse: (!) 58   Resp: 16   Temp: (!) 97 F (36.1 C)   TempSrc: Oral   SpO2: 97%   Weight: 185 lb (83.9 kg)   Height: 6' (1.829 m)    Body mass index is 25.09 kg/m.  General: A&O x 3, Well nourished male in NAD HEENT: no gross abnormalities Pulmonary: Sym exp, good air movt, CTAB, no rales, rhonchi, or wheezing.  Cardiac: RRR, Nl S1, S2, no detected murmur.  Carotid Bruits Right Left   Negative Negative    Aorta is not palpable. Radial pulses are 2+ and equal.                         VASCULAR EXAM: Extremities without ischemic changes, without Gangrene; without open wounds.                                                                                                         LE Pulses Right Left       FEMORAL  not palpable  not palpable        POPLITEAL   not palpable   not palpable       POSTERIOR TIBIAL  not palpable   not palpable        DORSALIS PEDIS      ANTERIOR TIBIAL not palpable  not palpable    Gastrointestinal: soft, NTND, -G/R, - HSM, - masses, - CVAT B. Musculoskeletal: M/S 5/5 throughout, Extremities without ischemic changes. Neurologic: Pain and light touch intact in extremities, Motor exam as listed above. Psychiatric: normal affect    DATA:   ABI Findings (12-11-19): +---------+------------------+-----+----------+--------+ Right    Rt Pressure (mmHg)IndexWaveform  Comment  +---------+------------------+-----+----------+--------+ Brachial 148                                       +---------+------------------+-----+----------+--------+ PTA      125               0.84 monophasic         +---------+------------------+-----+----------+--------+ DP  98                0.66 monophasic         +---------+------------------+-----+----------+--------+ Great Toe89                0.60                    +---------+------------------+-----+----------+--------+  +---------+------------------+-----+---------+-------+ Left     Lt Pressure (mmHg)IndexWaveform Comment +---------+------------------+-----+---------+-------+ Brachial 132                                     +---------+------------------+-----+---------+-------+ PTA      146               0.99 triphasic        +---------+------------------+-----+---------+-------+ DP       129               0.87 biphasic         +---------+------------------+-----+---------+-------+ Great Toe98                0.66                  +---------+------------------+-----+---------+-------+  +-------+-----------+-----------+------------+------------+ ABI/TBIToday's ABIToday's TBIPrevious ABIPrevious TBI +-------+-----------+-----------+------------+------------+ Right  0.84       0.6        1.15         0.76         +-------+-----------+-----------+------------+------------+ Left   0.99       0.66       1.04        0.63         +-------+-----------+-----------+------------+------------+ Previous ABI on 12/11/16.   Summary: Right: Resting right ankle-brachial index indicates mild right lower extremity arterial disease. The right toe-brachial index is abnormal. RT great toe pressure = 89 mmHg.  Left: Resting left ankle-brachial index is within normal range. No evidence of significant left lower extremity arterial disease. The left toe-brachial index is abnormal. LT Great toe pressure = 98 mmHg.   Carotid Duplex (06/12/18): Bilateral ICA with 1-39% stenosis Bilateral vertebral artery flow is antegrade.  Bilateral subclavian artery waveforms are normal.  No change compared to the exam on 09-21-14.   Left Popliteal Artery Duplex (06/12/18); No aneurysm    Medical Decision Making  David [name] is a 85 y.o. male who is s/p open juxtarenal aneurysm repair with a straight graft 24 mm in diameter on 11/08/2014.  He also has known ectasia of the descending thoracic aorta from a CTA chest done on 09-17-14, at that time 4.3 cm in diameter; Dr. Cyndia Bent is monitoring this.    Pt denies abdominal pain, denies chest pain. He has a history lumbar disc issues, but this has not bothered him lately.  He stays physically active, has no claudication symptoms.   ABI's today shows slight decline in right ABI from normal to mild disease, monophasic waveforms. Left remains normal with tri and biphasic waveforms.    The next ABI will be scheduled for 1 year, carotid duplex at that time also   Clemon Chambers, RN, MSN, FNP-C [facility] [place] Office: [facility] Physician: Donzetta Matters   12/11/2019, 12:24 PM

## 2019-12-28 ENCOUNTER — Other Ambulatory Visit: Payer: Self-pay | Admitting: *Deleted

## 2019-12-28 DIAGNOSIS — I739 Peripheral vascular disease, unspecified: Secondary | ICD-10-CM

## 2019-12-28 DIAGNOSIS — I6523 Occlusion and stenosis of bilateral carotid arteries: Secondary | ICD-10-CM

## 2020-01-22 ENCOUNTER — Other Ambulatory Visit: Payer: Self-pay

## 2020-01-22 ENCOUNTER — Encounter: Payer: Self-pay | Admitting: Podiatry

## 2020-01-22 ENCOUNTER — Ambulatory Visit: Payer: Medicare Other | Admitting: Podiatry

## 2020-01-22 DIAGNOSIS — B351 Tinea unguium: Secondary | ICD-10-CM | POA: Diagnosis not present

## 2020-01-22 DIAGNOSIS — D689 Coagulation defect, unspecified: Secondary | ICD-10-CM

## 2020-01-22 DIAGNOSIS — M79676 Pain in unspecified toe(s): Secondary | ICD-10-CM | POA: Diagnosis not present

## 2020-01-22 DIAGNOSIS — E1142 Type 2 diabetes mellitus with diabetic polyneuropathy: Secondary | ICD-10-CM | POA: Diagnosis not present

## 2020-01-22 NOTE — Progress Notes (Signed)
Patient ID: QUANTAVIOUS SCHILLO, male   DOB: 1931/03/15, 86 y.o.   MRN: [id number] Complaint:  Visit Type: Patient returns to my office for continued preventative foot care services. Complaint: Patient states" my nails have grown long and thick and become painful to walk and wear shoes" Patient has been diagnosed with DM neuropathy and angiopathy.. The patient presents for preventative foot care services. No changes to ROS.  Patient is taking plavix.    Podiatric Exam: Vascular: dorsalis pedis and posterior tibial pulses are weakly  palpable bilateral. Capillary return is immediate. Temperature gradient is WNL. Skin turgor WNL Venous staisis B/L. Sensorium: Absent  Semmes Weinstein monofilament test. Absent  tactile sensation bilaterally. Nail Exam: Pt has thick disfigured discolored nails with subungual debris noted bilateral entire nail hallux through fifth toenails Ulcer Exam: There is no evidence of ulcer or pre-ulcerative changes or infection. Orthopedic Exam: Muscle tone and strength are WNL. No limitations in general ROM. No crepitus or effusions noted. Foot type and digits show no abnormalities. HAV  B/L.  Hammer toes  B/L Skin:  . No infection or ulcers.  Asymptomatic porokeratosis sub 5th  B/L  Diagnosis:  Onychomycosis, , Pain in right toe, pain in left toes,  DPN,  HAV  Hammer toes  B/L  Treatment & Plan Procedures and Treatment: Consent by patient was obtained for treatment procedures. The patient understood the discussion of treatment and procedures well. All questions were answered thoroughly reviewed. Debridement of mycotic and hypertrophic toenails, 1 through 5 bilateral and clearing of subungual debris. No ulceration, no infection noted.  Return Visit-Office Procedure: Patient instructed to return to the office for a follow up visit 10 weeks  for continued evaluation and treatment.   Gardiner Barefoot DPM

## 2020-04-01 ENCOUNTER — Ambulatory Visit: Payer: Medicare Other | Admitting: Podiatry

## 2020-04-01 ENCOUNTER — Other Ambulatory Visit: Payer: Self-pay

## 2020-04-01 ENCOUNTER — Encounter: Payer: Self-pay | Admitting: Podiatry

## 2020-04-01 VITALS — Temp 97.7°F

## 2020-04-01 DIAGNOSIS — E1142 Type 2 diabetes mellitus with diabetic polyneuropathy: Secondary | ICD-10-CM

## 2020-04-01 DIAGNOSIS — B351 Tinea unguium: Secondary | ICD-10-CM

## 2020-04-01 DIAGNOSIS — D689 Coagulation defect, unspecified: Secondary | ICD-10-CM | POA: Diagnosis not present

## 2020-04-01 DIAGNOSIS — M79676 Pain in unspecified toe(s): Secondary | ICD-10-CM | POA: Diagnosis not present

## 2020-04-01 NOTE — Progress Notes (Signed)
This patient returns to my office for at risk foot care.  This patient requires this care by a professional since this patient will be at risk due to having chronic kidney disease, thrombocytopenia and type 2 diabetes.   Patient is taking plavix.   This patient is unable to cut nails himself since the patient cannot reach his nails.These nails are painful walking and wearing shoes.  This patient presents for at risk foot care today.  General Appearance  Alert, conversant and in no acute stress.  Vascular  Dorsalis pedis and posterior tibial  pulses are weakly  palpable  bilaterally.  Capillary return is within normal limits  bilaterally. Temperature is within normal limits  Bilaterally.  Venous stasis  B/L.  Neurologic  Senn-Weinstein monofilament wire test absent   bilaterally. Muscle power within normal limits bilaterally.  Nails Thick disfigured discolored nails with subungual debris  from hallux to fifth toes bilaterally. No evidence of bacterial infection or drainage bilaterally.  Orthopedic  No limitations of motion  feet .  No crepitus or effusions noted.  HAV  B/L.  Hammer toes  B/L.  Skin  normotropic skin with no porokeratosis noted bilaterally.  No signs of infections or ulcers noted.     Onychomycosis  Pain in right toes  Pain in left toes  Consent was obtained for treatment procedures.   Mechanical debridement of nails 1-5  bilaterally performed with a nail nipper.  Filed with dremel without incident.    Return office visit   10 weeks                  Told patient to return for periodic foot care and evaluation due to potential at risk complications.   Gardiner Barefoot DPM

## 2020-06-10 ENCOUNTER — Encounter: Payer: Self-pay | Admitting: Podiatry

## 2020-06-10 ENCOUNTER — Other Ambulatory Visit: Payer: Self-pay

## 2020-06-10 ENCOUNTER — Ambulatory Visit: Payer: Medicare Other | Admitting: Podiatry

## 2020-06-10 DIAGNOSIS — B351 Tinea unguium: Secondary | ICD-10-CM

## 2020-06-10 DIAGNOSIS — M79676 Pain in unspecified toe(s): Secondary | ICD-10-CM | POA: Diagnosis not present

## 2020-06-10 DIAGNOSIS — D689 Coagulation defect, unspecified: Secondary | ICD-10-CM

## 2020-06-10 DIAGNOSIS — E1142 Type 2 diabetes mellitus with diabetic polyneuropathy: Secondary | ICD-10-CM | POA: Diagnosis not present

## 2020-06-10 NOTE — Progress Notes (Signed)
This patient returns to my office for at risk foot care.  This patient requires this care by a professional since this patient will be at risk due to having chronic kidney disease, thrombocytopenia and type 2 diabetes.   Patient is taking plavix.    This patient is unable to cut nails himself since the patient cannot reach his nails.These nails are painful walking and wearing shoes.  This patient presents for at risk foot care today.  General Appearance  Alert, conversant and in no acute stress.  Vascular  Dorsalis pedis and posterior tibial  pulses are weakly  palpable  bilaterally.  Capillary return is within normal limits  bilaterally. Temperature is within normal limits  Bilaterally.  Venous stasis  B/L.  Neurologic  Senn-Weinstein monofilament wire test absent   bilaterally. Muscle power within normal limits bilaterally.  Nails Thick disfigured discolored nails with subungual debris  from hallux to fifth toes bilaterally. No evidence of bacterial infection or drainage bilaterally.  Orthopedic  No limitations of motion  feet .  No crepitus or effusions noted.  HAV  B/L.  Hammer toes  B/L.  Exostosis IPJ left hallux.  Skin  normotropic skin with no porokeratosis noted bilaterally.  No signs of infections or ulcers noted.     Onychomycosis  Pain in right toes  Pain in left toes  Consent was obtained for treatment procedures.   Mechanical debridement of nails 1-5  bilaterally performed with a nail nipper.  Filed with dremel without incident.    Return office visit   10 weeks                  Told patient to return for periodic foot care and evaluation due to potential at risk complications.   Gardiner Barefoot DPM

## 2020-07-07 ENCOUNTER — Other Ambulatory Visit: Payer: Self-pay | Admitting: *Deleted

## 2020-07-07 DIAGNOSIS — I712 Thoracic aortic aneurysm, without rupture, unspecified: Secondary | ICD-10-CM

## 2020-07-08 ENCOUNTER — Other Ambulatory Visit: Payer: Self-pay | Admitting: *Deleted

## 2020-08-17 ENCOUNTER — Ambulatory Visit: Payer: Medicare Other | Admitting: Surgery

## 2020-08-17 ENCOUNTER — Encounter: Payer: Self-pay | Admitting: Surgery

## 2020-08-17 ENCOUNTER — Ambulatory Visit
Admission: RE | Admit: 2020-08-17 | Discharge: 2020-08-17 | Disposition: A | Discharge status: Home / Self Care | Payer: Medicare Other | Source: Ambulatory Visit | Attending: Surgery | Admitting: Surgery

## 2020-08-17 ENCOUNTER — Other Ambulatory Visit: Payer: Self-pay

## 2020-08-17 VITALS — BP 139/65 | HR 50 | Temp 97.7°F | Resp 20 | Ht 72.0 in | Wt 182.0 lb

## 2020-08-17 DIAGNOSIS — I712 Thoracic aortic aneurysm, without rupture, unspecified: Secondary | ICD-10-CM

## 2020-08-17 NOTE — Progress Notes (Signed)
HPI:  The patient is an 86 year old gentleman with a history of hypertension, hyperlipidemia, diabetes, COPD, stage III chronic kidney disease, remote stroke, colon and thyroid cancer and AAA who underwent open juxtarenal aneurysm repair by Dr. early in 2015.   He returns today for follow-up of a 4.1 cm fusiform ascending aortic aneurysm.  He has been feeling well without chest pain or shortness of breath.  Current Outpatient Medications  Medication Sig Dispense Refill  . acetaminophen (TYLENOL) 500 MG tablet Take 500-1,000 mg by mouth at bedtime. Take 500 mg daily at bedtime and may take an additional 500mg  tablet if needed for pain    . clopidogrel (PLAVIX) 75 MG tablet Take 75 mg by mouth at bedtime.     . COMBIVENT RESPIMAT 20-100 MCG/ACT AERS respimat     . Fluticasone-Salmeterol (ADVAIR) 250-50 MCG/DOSE AEPB Inhale 1 puff into the lungs every 12 (twelve) hours.    Marland Kitchen HUMALOG KWIKPEN 100 UNIT/ML KwikPen INJECT 3 UNITS C BREAKFAST AND DINNER E11.29    . Lancets (ONETOUCH DELICA PLUS [id number]) MISC     . LEVEMIR FLEXTOUCH 100 UNIT/ML Pen Inject 6 Units into the skin at bedtime.     Marland Kitchen levothyroxine (SYNTHROID, LEVOTHROID) 125 MCG tablet Take 125 mcg by mouth daily before breakfast.     . mirtazapine (REMERON SOL-TAB) 15 MG disintegrating tablet Take 15 mg by mouth at bedtime.    Glory Rosebush ULTRA test strip     . rosuvastatin (CRESTOR) 10 MG tablet Take 10 mg by mouth at bedtime.    . valsartan-hydrochlorothiazide (DIOVAN-HCT) 80-12.5 MG tablet Take 1 tablet by mouth daily.    . Vitamin D, Ergocalciferol, (DRISDOL) 50000 UNITS CAPS Take 50,000 Units by mouth every Monday.      Current Facility-Administered Medications  Medication Dose Route Frequency Provider Last Rate Last Admin  . 0.9 %  sodium chloride infusion  500 mL Intravenous Continuous Irene Shipper, MD         Physical Exam: BP 139/65 (BP Location: Left Arm)   Pulse (!) 50 Comment: irregular, fluctuating between 38-60  bpm  Temp 97.7 F (36.5 C) (Temporal)   Resp 20   Ht 6' (1.829 m)   Wt 182 lb (82.6 kg)   SpO2 94% Comment: RA  BMI 24.68 kg/m  He looks well. Cardiac exam shows a regular rate and rhythm with normal heart sounds.  There is no murmur. Lungs are clear.  Diagnostic Tests:  CLINICAL DATA:  86 year old male with a history of thoracic aortic aneurysm  EXAM: CT CHEST WITHOUT CONTRAST  TECHNIQUE: Multidetector CT imaging of the chest was performed following the standard protocol without IV contrast.  COMPARISON:  08/12/2019, 07/04/2018, CT 09/21/2014  FINDINGS: Cardiovascular: Heart size within normal limits. No pericardial fluid/thickening.  Calcifications of left main, left anterior descending, circumflex, right coronary arteries.  Mild aortic valve calcifications. Greatest estimated diameter of the ascending aorta 3.8 cm on the current CT.  Greatest diameter of the aorta in the distal arch/zone 3, 3.5 cm.  Diameter in the descending thoracic aorta 4.0 cm on image 101 of series 2. This is unchanged from the comparison CT.  Diameter of the aorta at the hiatus measures 3.2 cm.  Mediastinum/Nodes: Small lymph nodes of the mediastinum.  Unremarkable course of the thoracic esophagus.  Redemonstration of partially calcified lymph nodes in the lowest right paratracheal nodal station.  Unremarkable thoracic inlet.  Lungs/Pleura: Centrilobular and paraseptal emphysema. Architectural distortion at the bilateral lung apices. Bronchiectasis  bilaterally.  - nodule at the periphery of the left lower lobe measures 6 mm, relatively unchanged from the most recent comparison.  -nodule of the lingula, 3 mm, unchanged from the CT 09/21/2014.  -nodule at the right apex measuring 5 mm-6 mm. This is essentially unchanged from the CT dated 09/21/2014.  No pneumothorax or pleural effusion. No confluent airspace disease. This was present on the CT dated 09/21/2014  and is essentially unchanged.  Atelectasis/scarring at the lung bases.  Upper Abdomen: No acute finding of the upper abdomen  Musculoskeletal: No acute displaced fracture. No bony canal narrowing. Mild degenerative changes.  IMPRESSION: Redemonstration of aortic aneurysmal disease. There is relatively unchanged size and configuration of the thoracic aorta, with the greatest diameter estimated in the mid descending, approximately 4 cm.  Aortic atherosclerosis and coronary artery disease. Aortic Atherosclerosis (ICD10-I70.0).  Similar appearance of bilateral small lung nodules which are unchanged dating to the CT comparison of 09/21/2014.  Emphysema (ICD10-J43.9).  Signed,  Dulcy Fanny. Dellia Nims, RPVI  [facility]  [facility]   Electronically Signed   By: Corrie Mckusick D.O.   On: 08/17/2020 10:53   Impression:  This 86 year old gentleman has a stable 4.0 cm fusiform descending aortic aneurysm which has been stable since 2015.  This is well below the surgical threshold of 5 to 5.5 cm and given his advanced age I think is extremely unlikely that this will enlarge that size.  I reviewed the CT images with the patient and his wife and answered their questions.  I do not think there is any benefit to continued yearly CT scans in this elderly patient with a relatively small aneurysm.  He has wife are in agreement with that.  I stressed the importance of continued good blood pressure control and preventing further enlargement and acute aortic dissection.  Plan:  He will continue to follow-up with Dr. Joylene Draft.  I will be happy to see him back if the need arises.  I spent 20 minutes performing this established patient evaluation and > 50% of this time was spent face to face counseling and coordinating the care of this patient's aortic aneurysm.    Gaye Pollack, MD [facility] [phone]

## 2020-09-09 ENCOUNTER — Other Ambulatory Visit: Payer: Self-pay

## 2020-09-09 ENCOUNTER — Ambulatory Visit: Payer: Medicare Other | Admitting: Podiatry

## 2020-09-09 ENCOUNTER — Encounter: Payer: Self-pay | Admitting: Podiatry

## 2020-09-09 DIAGNOSIS — B351 Tinea unguium: Secondary | ICD-10-CM

## 2020-09-09 DIAGNOSIS — D689 Coagulation defect, unspecified: Secondary | ICD-10-CM | POA: Diagnosis not present

## 2020-09-09 DIAGNOSIS — E1142 Type 2 diabetes mellitus with diabetic polyneuropathy: Secondary | ICD-10-CM | POA: Diagnosis not present

## 2020-09-09 DIAGNOSIS — M79676 Pain in unspecified toe(s): Secondary | ICD-10-CM | POA: Diagnosis not present

## 2020-09-09 NOTE — Progress Notes (Signed)
This patient returns to my office for at risk foot care.  This patient requires this care by a professional since this patient will be at risk due to having chronic kidney disease, thrombocytopenia and type 2 diabetes.   Patient is taking plavix.    This patient is unable to cut nails himself since the patient cannot reach his nails.These nails are painful walking and wearing shoes.  This patient presents for at risk foot care today. Patient requests new diabetic shoes.  General Appearance  Alert, conversant and in no acute stress.  Vascular  Dorsalis pedis and posterior tibial  pulses are weakly  palpable  bilaterally.  Capillary return is within normal limits  bilaterally. Temperature is within normal limits  Bilaterally.  Venous stasis  B/L.  Neurologic  Senn-Weinstein monofilament wire test absent   bilaterally. Muscle power within normal limits bilaterally.  Nails Thick disfigured discolored nails with subungual debris  from hallux to fifth toes bilaterally. No evidence of bacterial infection or drainage bilaterally.  Orthopedic  No limitations of motion  feet .  No crepitus or effusions noted.  HAV  B/L.  Hammer toes  B/L.  Exostosis IPJ left hallux.  Skin  normotropic skin with no porokeratosis noted bilaterally.  No signs of infections or ulcers noted.     Onychomycosis  Pain in right toes  Pain in left toes  Consent was obtained for treatment procedures.   Mechanical debridement of nails 1-5  bilaterally performed with a nail nipper.  Filed with dremel without incident. Patient was told to make an appointment with [facility] for new shoes.   Return office visit   10 weeks                  Told patient to return for periodic foot care and evaluation due to potential at risk complications.   Gardiner Barefoot DPM

## 2020-10-31 IMAGING — CT CT CHEST W/O CM
2 of 4 series · 11 of 36 positions shown, 13 images · non-contrast
Comparison: 08/12/2019, 07/04/2018, CT 09/21/2014

CLINICAL DATA: 88-year-old male with a history of thoracic aortic
aneurysm

EXAM:
CT CHEST WITHOUT CONTRAST
TECHNIQUE: Multidetector CT imaging of the chest was performed following the
standard protocol without IV contrast.

[Series 2: chest 2.00 br40 s3 · axial · 0.55mm/px · z∈[+1548,+1849]mm · 8 of 179 slices shown, 10 images (1 of 2)]
[im 14/179  mediastinal]
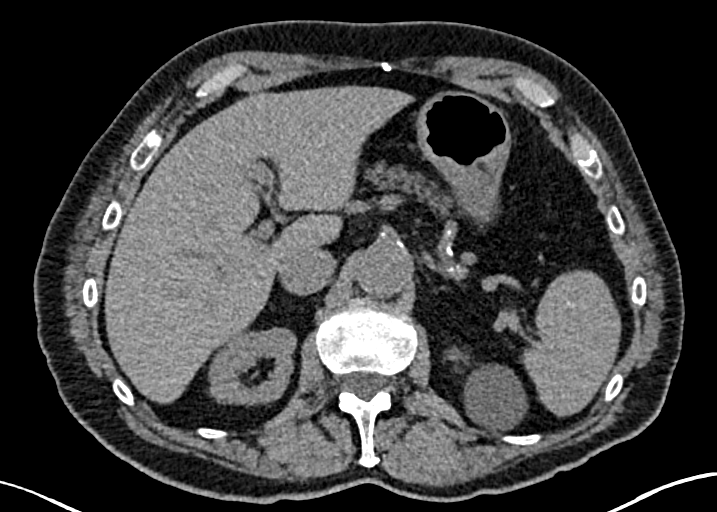
[im 14/179  lung]
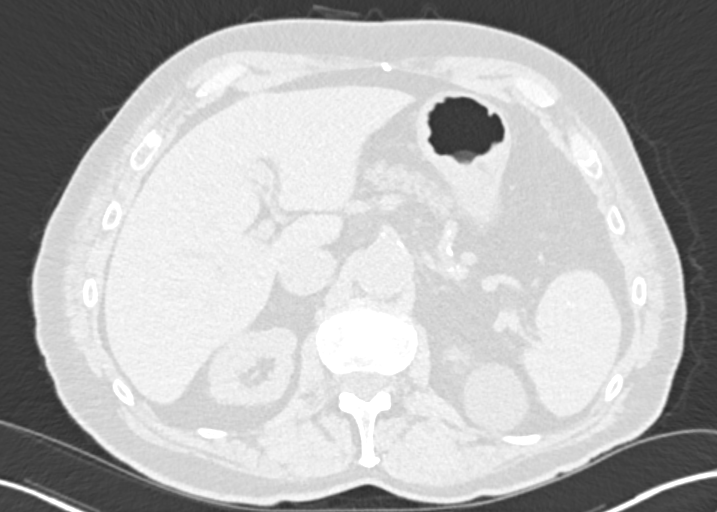
[im 42/179  lung]
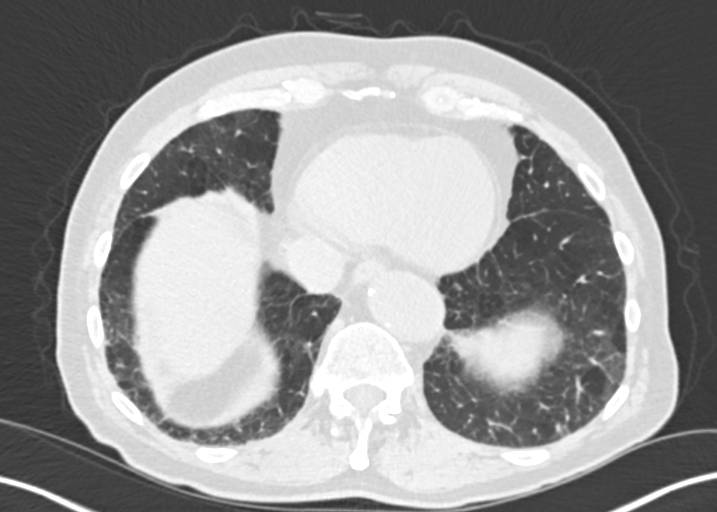
[im 55/179  lung]
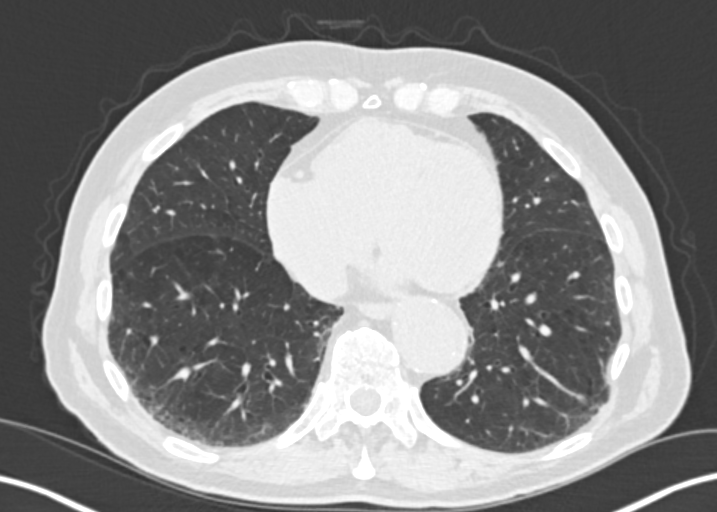
[im 83/179  lung]
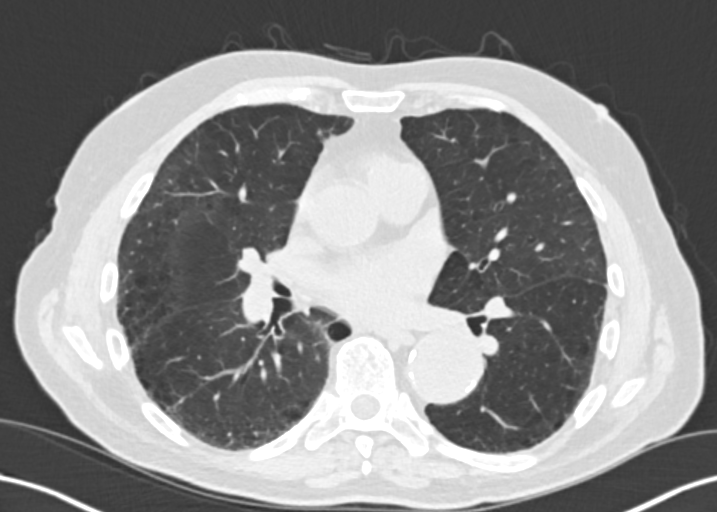
[im 96/179  mediastinal]
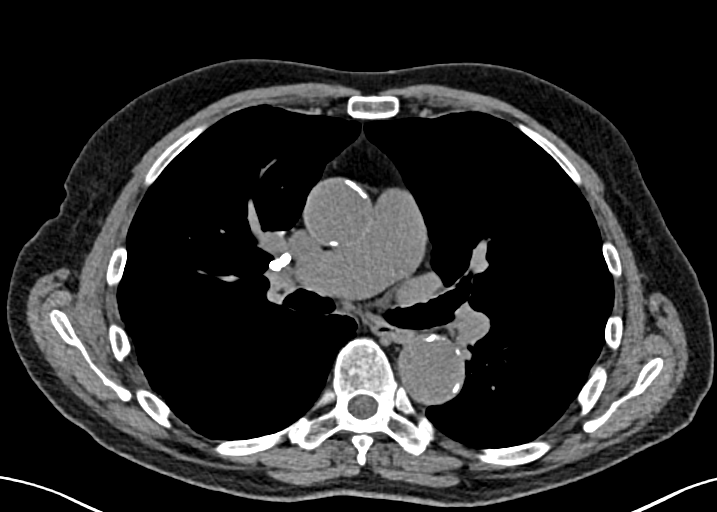
[im 96/179  lung]
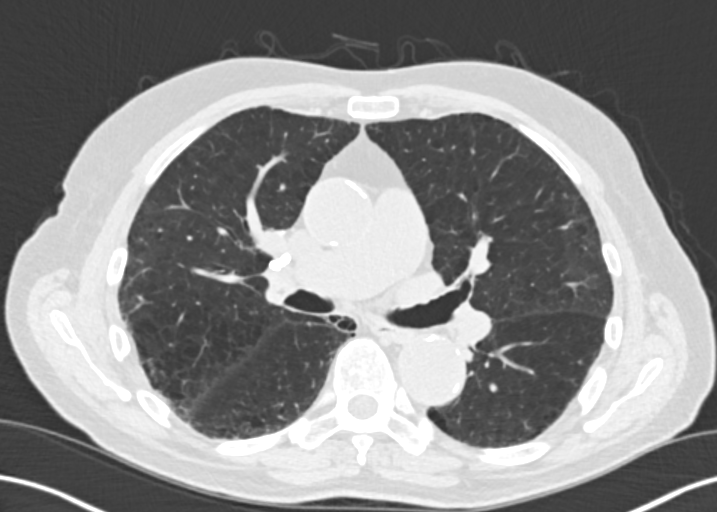
[im 124/179  lung]
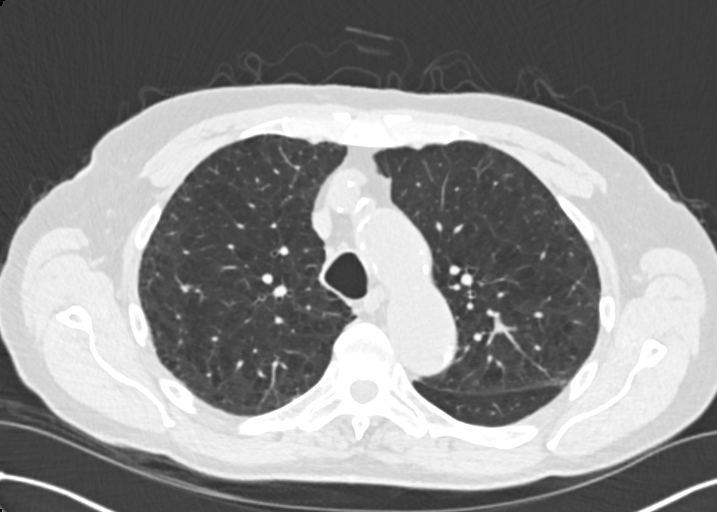
[im 137/179  lung]
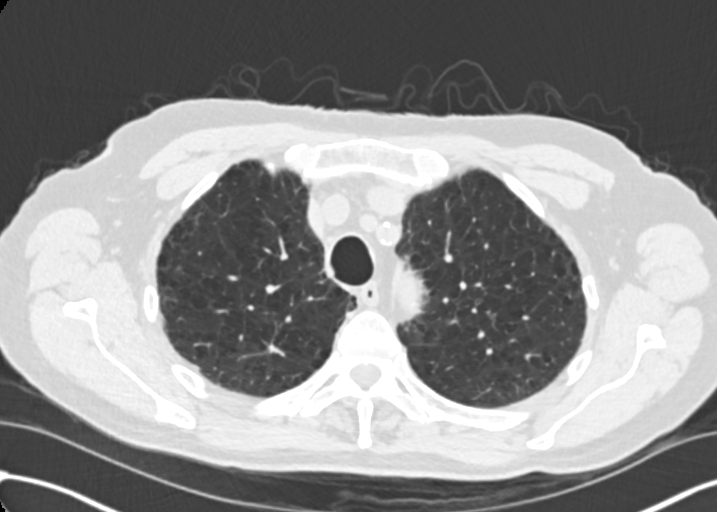
[im 165/179  lung]
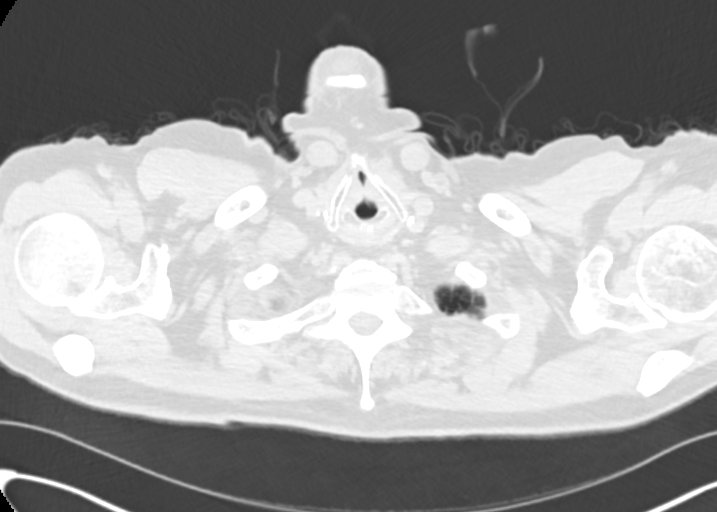

[Series 4: chest 2.00 br40 s3 · coronal · 0.70mm/px · 3 of 141 slices shown (2 of 2)]
[im 29/141  lung]
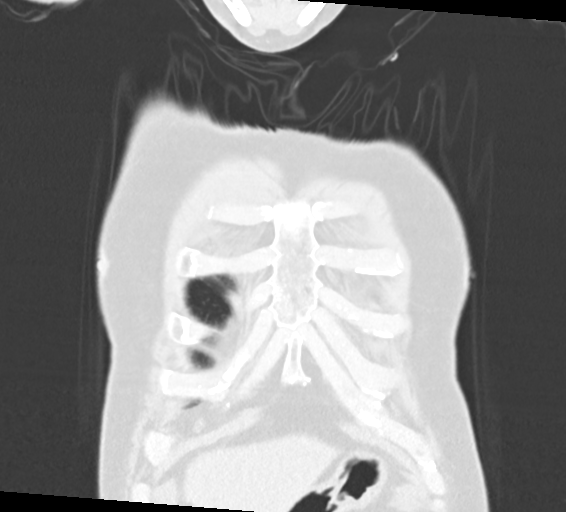
[im 57/141  lung]
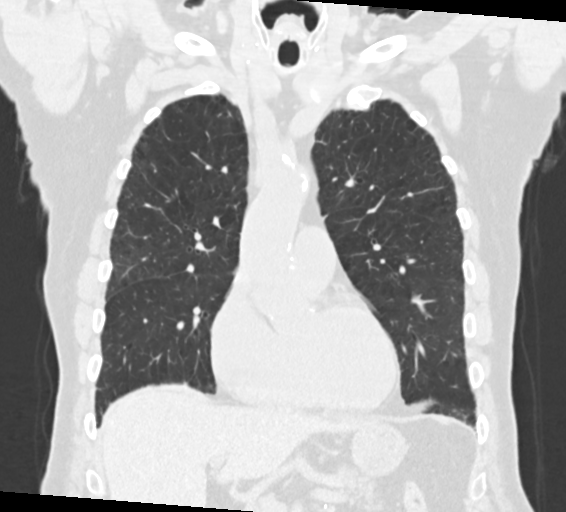
[im 85/141  lung]
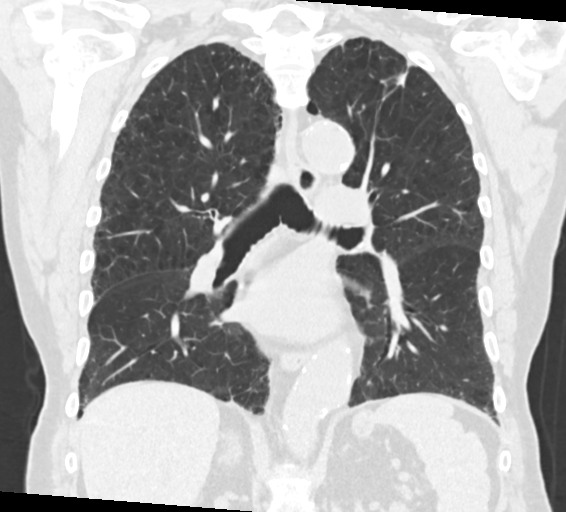

[11 of 36 positions shown; findings below may reference images not displayed]

FINDINGS: Cardiovascular: Heart size within normal limits. No pericardial
fluid/thickening.

Calcifications of left main, left anterior descending, circumflex,
right coronary arteries.

Mild aortic valve calcifications. Greatest estimated diameter of the
ascending aorta 3.8 cm on the current CT.

Greatest diameter of the aorta in the distal arch/zone 3, 3.5 cm.

Diameter in the descending thoracic aorta 4.0 cm on image 101 of
series 2. This is unchanged from the comparison CT.

Diameter of the aorta at the hiatus measures 3.2 cm.

Mediastinum/Nodes: Small lymph nodes of the mediastinum.

Unremarkable course of the thoracic esophagus.

Redemonstration of partially calcified lymph nodes in the lowest
right paratracheal nodal station.

Unremarkable thoracic inlet.

Lungs/Pleura: Centrilobular and paraseptal emphysema. Architectural
distortion at the bilateral lung apices. Bronchiectasis bilaterally.

- nodule at the periphery of the left lower lobe measures 6 mm,
relatively unchanged from the most recent comparison.

-nodule of the lingula, 3 mm, unchanged from the CT 09/21/2014.

-nodule at the right apex measuring 5 mm-6 mm. This is essentially
unchanged from the CT dated 09/21/2014.

No pneumothorax or pleural effusion. No confluent airspace disease.
This was present on the CT dated 09/21/2014 and is essentially
unchanged.

Atelectasis/scarring at the lung bases.

Upper Abdomen: No acute finding of the upper abdomen

Musculoskeletal: No acute displaced fracture. No bony canal
narrowing. Mild degenerative changes.
IMPRESSION: Redemonstration of aortic aneurysmal disease. There is relatively
unchanged size and configuration of the thoracic aorta, with the
greatest diameter estimated in the mid descending, approximately 4
cm.

Aortic atherosclerosis and coronary artery disease. Aortic
Atherosclerosis (1CD03-SMU.U).

Similar appearance of bilateral small lung nodules which are
unchanged dating to the CT comparison of 09/21/2014.

Emphysema (1CD03-24L.D).

## 2020-11-28 ENCOUNTER — Other Ambulatory Visit: Payer: Self-pay | Admitting: Urology

## 2020-12-09 ENCOUNTER — Encounter: Payer: Self-pay | Admitting: Podiatry

## 2020-12-09 ENCOUNTER — Other Ambulatory Visit: Payer: Self-pay

## 2020-12-09 ENCOUNTER — Ambulatory Visit: Payer: Medicare Other | Admitting: Podiatry

## 2020-12-09 DIAGNOSIS — E1142 Type 2 diabetes mellitus with diabetic polyneuropathy: Secondary | ICD-10-CM | POA: Diagnosis not present

## 2020-12-09 DIAGNOSIS — Q828 Other specified congenital malformations of skin: Secondary | ICD-10-CM

## 2020-12-09 DIAGNOSIS — B351 Tinea unguium: Secondary | ICD-10-CM | POA: Diagnosis not present

## 2020-12-09 DIAGNOSIS — M79676 Pain in unspecified toe(s): Secondary | ICD-10-CM | POA: Diagnosis not present

## 2020-12-09 DIAGNOSIS — D689 Coagulation defect, unspecified: Secondary | ICD-10-CM

## 2020-12-09 NOTE — Progress Notes (Signed)
This patient returns to my office for at risk foot care.  This patient requires this care by a professional since this patient will be at risk due to having chronic kidney disease, thrombocytopenia and type 2 diabetes.   Patient is taking plavix.    This patient is unable to cut nails himself since the patient cannot reach his nails.These nails are painful walking and wearing shoes.   Patient says the callus on his left forefoot is painful walking barefoot. This patient presents for at risk foot care today.   General Appearance  Alert, conversant and in no acute stress.  Vascular  Dorsalis pedis and posterior tibial  pulses are weakly  palpable  bilaterally.  Capillary return is within normal limits  bilaterally. Temperature is within normal limits  Bilaterally.  Venous stasis  B/L.  Neurologic  Senn-Weinstein monofilament wire test absent   bilaterally. Muscle power within normal limits bilaterally.  Nails Thick disfigured discolored nails with subungual debris  from hallux to fifth toes bilaterally. No evidence of bacterial infection or drainage bilaterally.  Orthopedic  No limitations of motion  feet .  No crepitus or effusions noted.  HAV  B/L.  Hammer toes  B/L.  Exostosis IPJ left hallux.  Skin  normotropic skin with no porokeratosis noted bilaterally.  No signs of infections or ulcers noted.   Porokeratosis sub 5th  Left foot.  Onychomycosis  Pain in right toes  Pain in left toes  Consent was obtained for treatment procedures.   Mechanical debridement of nails 1-5  bilaterally performed with a nail nipper.  Filed with dremel without incident. Debride porokeratosis with # 15 blade.   Return office visit   10 weeks                  Told patient to return for periodic foot care and evaluation due to potential at risk complications.   Gardiner Barefoot DPM

## 2020-12-20 NOTE — Progress Notes (Signed)
DUE TO COVID-19 ONLY ONE VISITOR IS ALLOWED TO COME WITH YOU AND STAY IN THE WAITING ROOM ONLY DURING PRE OP AND PROCEDURE DAY OF SURGERY. THE 1 VISITOR  MAY VISIT WITH YOU AFTER SURGERY IN YOUR PRIVATE ROOM DURING VISITING HOURS ONLY!  YOU NEED TO HAVE A COVID 19 TEST ON__1/03/2021 _____ @_______ , THIS TEST MUST BE DONE BEFORE SURGERY,  COVID TESTING SITE [address] [place] , IT IS ON THE RIGHT GOING OUT [address] APPROXIMATELY  2 MINUTES PAST [employer] ON THE RIGHT. ONCE YOUR COVID TEST IS COMPLETED,  PLEASE BEGIN THE QUARANTINE INSTRUCTIONS AS OUTLINED IN YOUR HANDOUT.                David [name]  12/20/2020   Your procedure is scheduled on: 12/30/2020    Report to [facility] Main  Entrance   Report to admitting at     0530 AM     Call this number if you have problems the morning of surgery [phone]    Remember: Do not eat food , candy gum or mints :After Midnight. You may have clear liquids from midnight until 0430am     CLEAR LIQUID DIET   Foods Allowed                                                                       Coffee and tea, regular and decaf                              Plain Jell-O any favor except red or purple                                            Fruit ices (not with fruit pulp)                                      Iced Popsicles                                     Carbonated beverages, regular and diet                                    Cranberry, grape and apple juices Sports drinks like Gatorade Lightly seasoned clear broth or consume(fat free) Sugar, honey syrup   _____________________________________________________________________    BRUSH YOUR TEETH MORNING OF SURGERY AND RINSE YOUR MOUTH OUT, NO CHEWING GUM CANDY OR MINTS.     Take these medicines the morning of surgery with A SIP OF WATER: iinhalers as usual and bring, Synthroid   DO NOT TAKE ANY DIABETIC MEDICATIONS DAY OF YOUR  SURGERY                               You may  not have any metal on your body including hair pins and              piercings  Do not wear jewelry, make-up, lotions, powders or perfumes, deodorant             Do not wear nail polish on your fingernails.  Do not shave  48 hours prior to surgery.              Men may shave face and neck.   Do not bring valuables to the hospital. [place].  Contacts, dentures or bridgework may not be worn into surgery.  Leave suitcase in the car. After surgery it may be brought to your room.     Patients discharged the day of surgery will not be allowed to drive home. IF YOU ARE HAVING SURGERY AND GOING HOME THE SAME DAY, YOU MUST HAVE AN ADULT TO DRIVE YOU HOME AND BE WITH YOU FOR 24 HOURS. YOU MAY GO HOME BY TAXI OR UBER OR ORTHERWISE, BUT AN ADULT MUST ACCOMPANY YOU HOME AND STAY WITH YOU FOR 24 HOURS.  Name and phone number of your driver:  Special Instructions: N/A              Please read over the following fact sheets you were given: _____________________________________________________________________  [facility] - Preparing for Surgery Before surgery, you can play an important role.  Because skin is not sterile, your skin needs to be as free of germs as possible.  You can reduce the number of germs on your skin by washing with CHG (chlorahexidine gluconate) soap before surgery.  CHG is an antiseptic cleaner which kills germs and bonds with the skin to continue killing germs even after washing. Please DO NOT use if you have an allergy to CHG or antibacterial soaps.  If your skin becomes reddened/irritated stop using the CHG and inform your nurse when you arrive at Short Stay. Do not shave (including legs and underarms) for at least 48 hours prior to the first CHG shower.  You may shave your face/neck. Please follow these instructions carefully:  1.  Shower with CHG Soap the night before surgery and the   morning of Surgery.  2.  If you choose to wash your hair, wash your hair first as usual with your  normal  shampoo.  3.  After you shampoo, rinse your hair and body thoroughly to remove the  shampoo.                           4.  Use CHG as you would any other liquid soap.  You can apply chg directly  to the skin and wash                       Gently with a scrungie or clean washcloth.  5.  Apply the CHG Soap to your body ONLY FROM THE NECK DOWN.   Do not use on face/ open                           Wound or open sores. Avoid contact with eyes, ears mouth and genitals (private parts).  Wash face,  Genitals (private parts) with your normal soap.             6.  Wash thoroughly, paying special attention to the area where your surgery  will be performed.  7.  Thoroughly rinse your body with warm water from the neck down.  8.  DO NOT shower/wash with your normal soap after using and rinsing off  the CHG Soap.                9.  Pat yourself dry with a clean towel.            10.  Wear clean pajamas.            11.  Place clean sheets on your bed the night of your first shower and do not  sleep with pets. Day of Surgery : Do not apply any lotions/deodorants the morning of surgery.  Please wear clean clothes to the hospital/surgery center.  FAILURE TO FOLLOW THESE INSTRUCTIONS MAY RESULT IN THE CANCELLATION OF YOUR SURGERY PATIENT SIGNATURE_________________________________  NURSE SIGNATURE__________________________________  ________________________________________________________________________

## 2020-12-21 ENCOUNTER — Other Ambulatory Visit: Payer: Self-pay

## 2020-12-21 ENCOUNTER — Encounter (HOSPITAL_COMMUNITY): Payer: Self-pay

## 2020-12-21 ENCOUNTER — Encounter (HOSPITAL_COMMUNITY)
Admission: RE | Admit: 2020-12-21 | Discharge: 2020-12-21 | Disposition: A | Discharge status: Home / Self Care | Payer: Medicare Other | Source: Ambulatory Visit | Attending: Urology | Admitting: Urology

## 2020-12-21 DIAGNOSIS — Z01818 Encounter for other preprocedural examination: Secondary | ICD-10-CM | POA: Diagnosis present

## 2020-12-21 LAB — CBC
HCT: 43.1 % (ref 39.0–52.0)
Hemoglobin: 14.8 g/dL (ref 13.0–17.0)
MCH: 33.8 pg (ref 26.0–34.0)
MCHC: 34.3 g/dL (ref 30.0–36.0)
MCV: 98.4 fL (ref 80.0–100.0)
Platelets: 194 10*3/uL (ref 150–400)
RBC: 4.38 MIL/uL (ref 4.22–5.81)
RDW: 13.7 % (ref 11.5–15.5)
WBC: 6.8 10*3/uL (ref 4.0–10.5)
nRBC: 0 % (ref 0.0–0.2)

## 2020-12-21 LAB — BASIC METABOLIC PANEL
Anion gap: 9 (ref 5–15)
BUN: 39 mg/dL — ABNORMAL HIGH (ref 8–23)
CO2: 24 mmol/L (ref 22–32)
Calcium: 9.9 mg/dL (ref 8.9–10.3)
Chloride: 107 mmol/L (ref 98–111)
Creatinine, Ser: 1.97 mg/dL — ABNORMAL HIGH (ref 0.61–1.24)
GFR, Estimated: 32 mL/min — ABNORMAL LOW (ref >60)
Glucose, Bld: 139 mg/dL — ABNORMAL HIGH (ref 70–99)
Potassium: 4.6 mmol/L (ref 3.5–5.1)
Sodium: 140 mmol/L (ref 135–145)

## 2020-12-21 LAB — HEMOGLOBIN A1C
Hgb A1c MFr Bld: 7.3 % — ABNORMAL HIGH (ref 4.8–5.6)
Mean Plasma Glucose: 162.81 mg/dL

## 2020-12-21 LAB — GLUCOSE, CAPILLARY: Glucose-Capillary: 200 mg/dL — ABNORMAL HIGH (ref 70–99)

## 2020-12-21 NOTE — Progress Notes (Addendum)
Anesthesia Review:  PCP: DR Loraine Leriche perini- 11/25/20- clearance on chart  Cardiologist : followed by Dr Elease Hashimoto years ago per pt not seen in years Vasc- DR Evelene Croon- LOV 08/17/20   Chest x-ray :08/17/20- CT chest  EKG :12/21/2020  Echo : Stress test: Cardiac Cath :  Activity level:  Sleep Study/ CPAP : Fasting Blood Sugar :      / Checks Blood Sugar -- times a day:   Blood Thinner/ Instructions /Last Dose: ASA / Instructions/ Last Dose :  Plavix- stop 4 days prior per pt  Checks glucose once daily at home  BMP done 12/21/2020 routed to Dr Annabell Howells.  hgba1c done 12/21/2020-7.3

## 2020-12-22 NOTE — Progress Notes (Signed)
Anesthesia Chart Review   Case: [id number] Date/Time: 12/30/20 0715   Procedures:      CYSTOSCOPY WITH BIOPSY AND FULGURATION  INSTILL GEMCITABINE (N/A )     CYSTOSCOPY WITH BILATERAL RETROGRADE (Bilateral )   Anesthesia type: General   Pre-op diagnosis: bladder tumor   Location: [facility] PROC[facility]   Surgeons: Irine Seal, MD      DISCUSSION:86 y.o. former smoker with h/o HTN, COPD, CKD Stage III, DM II, AAA s/p repair 2015, bladder tumor scheduled for above procedure 12/30/2020 with Dr. Irine Seal.     Follows with vascular surgery yearly. Last seen by Dr. Cyndia Bent 08/17/2020, stable at this visit.   Anticipate pt can proceed with planned procedure barring acute status change.   VS: BP 120/61   Pulse 78   Temp 36.8 C (Oral)   Resp 18   Ht 6' (1.829 m)   Wt 82.1 kg   SpO2 96%   BMI 24.55 kg/m   PROVIDERS: [name], MD is PCP   Gilford Raid, MD is Cardiothoracic Surgeon LABS: Labs reviewed: Acceptable for surgery. (all labs ordered are listed, but only abnormal results are displayed)  Labs Reviewed  GLUCOSE, CAPILLARY - Abnormal; Notable for the following components:      Result Value   Glucose-Capillary 200 (*)    All other components within normal limits  HEMOGLOBIN A1C - Abnormal; Notable for the following components:   Hgb A1c MFr Bld 7.3 (*)    All other components within normal limits  BASIC METABOLIC PANEL - Abnormal; Notable for the following components:   Glucose, Bld 139 (*)    BUN 39 (*)    Creatinine, Ser 1.97 (*)    GFR, Estimated 32 (*)    All other components within normal limits  CBC     IMAGES: CT Chest 08/17/2020 IMPRESSION: Redemonstration of aortic aneurysmal disease. There is relatively unchanged size and configuration of the thoracic aorta, with the greatest diameter estimated in the mid descending, approximately 4 cm.  Aortic atherosclerosis and coronary artery disease. Aortic Atherosclerosis (ICD10-I70.0).  Similar  appearance of bilateral small lung nodules which are unchanged dating to the CT comparison of 09/21/2014.  Emphysema (ICD10-J43.9).  EKG: 12/21/20 Rate 84 bpm Sinus rhythm with occasional Premature ventricular complexes Otherwise normal ECG Premature ventricular complexes New since previous tracing  CV: Echo 06/14/2012 Study Conclusions   - Left ventricle: The cavity size was normal. Wall thickness  was increased in a pattern of mild LVH. Systolic function  was normal. The estimated ejection fraction was in the  range of 60% to 65%. Regional wall motion abnormalities  cannot be excluded. Doppler parameters are consistent with  abnormal left ventricular relaxation (grade 1 diastolic  dysfunction).  - Aortic valve: Mild regurgitation.  - Mitral valve: Mild regurgitation.  - Right ventricle: The cavity size was mildly dilated. Wall  thickness was normal.  - Right atrium: The atrium was mildly dilated.  - Pulmonary arteries: Systolic pressure was mildly to  moderately increased. PA peak pressure: 64mm Hg (S).   Past Medical History:  Diagnosis Date  . AAA (abdominal aortic aneurysm) ([place])   . AAA (abdominal aortic aneurysm) ([place])   . AAA (abdominal aortic aneurysm) ([place])   . Atherosclerosis    pt denies this  . BPH (benign prostatic hyperplasia)   . CKD (chronic kidney disease), stage III [facility]    family reports caused by scans but that has resolved since CT scans have stopped  .  Colon cancer (HCC)   . COPD (chronic obstructive pulmonary disease) (HCC)   . Diabetes mellitus   . ED (erectile dysfunction)   . Gastroesophageal reflux disease   . Heart murmur   . Hyperlipidemia   . Hypertension   . Lung nodules   . Neuromuscular disorder (HCC)    Neuropathy feet  . Osteopenia   . Peripheral vascular disease (HCC)   . Pneumonia June 2013  . Positional vertigo    intermittent   . Shortness of breath    chronic per patient  . Stroke (HCC)    x2 in 2006   . Stroke (HCC)   . Thyroid cancer (HCC)   . Thyroid disease   . Type 2 diabetes mellitus (HCC)   . Vitamin D deficiency     Past Surgical History:  Procedure Laterality Date  . ABDOMINAL AORTIC ANEURYSM REPAIR N/A 11/08/2014   Procedure: ANEURYSM ABDOMINAL AORTIC REPAIR;  Surgeon: Larina Earthly, MD;  Location: [facility] OR;  Service: Vascular;  Laterality: N/A;  . ABDOMINAL AORTIC ANEURYSM REPAIR    . COLON RESECTION  2007  . COLON SURGERY  2006   Resection  . SEPTOPLASTY    . THYROIDECTOMY  2007  . TRANSURETHRAL RESECTION OF BLADDER TUMOR WITH MITOMYCIN-C N/A 02/12/2017   Procedure: TRANSURETHRAL RESECTION OF BLADDER TUMOR;  Surgeon: Bjorn Pippin, MD;  Location: [facility];  Service: Urology;  Laterality: N/A;    MEDICATIONS: . acetaminophen (TYLENOL) 500 MG tablet  . allopurinol (ZYLOPRIM) 100 MG tablet  . clopidogrel (PLAVIX) 75 MG tablet  . COMBIVENT RESPIMAT 20-100 MCG/ACT AERS respimat  . Fluticasone-Salmeterol (ADVAIR) 250-50 MCG/DOSE AEPB  . HUMALOG KWIKPEN 100 UNIT/ML KwikPen  . Lancets (ONETOUCH DELICA PLUS LANCET30G) MISC  . LEVEMIR FLEXTOUCH 100 UNIT/ML Pen  . levothyroxine (SYNTHROID, LEVOTHROID) 125 MCG tablet  . mirtazapine (REMERON SOL-TAB) 15 MG disintegrating tablet  . ONETOUCH ULTRA test strip  . rosuvastatin (CRESTOR) 10 MG tablet  . valsartan-hydrochlorothiazide (DIOVAN-HCT) 80-12.5 MG tablet  . Vitamin D, Ergocalciferol, (DRISDOL) 50000 UNITS CAPS   . 0.9 %  sodium chloride infusion   Jodell Cipro, PA-C [facility] Pre-Surgical Testing [phone]

## 2020-12-27 ENCOUNTER — Other Ambulatory Visit (HOSPITAL_COMMUNITY)
Admission: RE | Admit: 2020-12-27 | Discharge: 2020-12-27 | Disposition: A | Discharge status: Home / Self Care | Payer: Medicare Other | Source: Ambulatory Visit | Attending: Urology | Admitting: Urology

## 2020-12-27 DIAGNOSIS — Z20822 Contact with and (suspected) exposure to covid-19: Secondary | ICD-10-CM | POA: Insufficient documentation

## 2020-12-27 DIAGNOSIS — Z01812 Encounter for preprocedural laboratory examination: Secondary | ICD-10-CM | POA: Insufficient documentation

## 2020-12-27 LAB — SARS CORONAVIRUS 2 (TAT 6-24 HRS): SARS Coronavirus 2: NEGATIVE

## 2020-12-29 NOTE — Anesthesia Preprocedure Evaluation (Addendum)
Anesthesia Evaluation  Patient identified by MRN, date of birth, ID band Patient awake    Reviewed: Allergy & Precautions, NPO status , Patient's Chart, lab work & pertinent test results  Airway Mallampati: II  TM Distance: >3 FB Neck ROM: Full    Dental  (+) Teeth Intact, Dental Advisory Given, Caps   Pulmonary shortness of breath, COPD,  COPD inhaler, former smoker,    Pulmonary exam normal breath sounds clear to auscultation       Cardiovascular hypertension, Pt. on medications + Peripheral Vascular Disease (AAA s.p stent)  Normal cardiovascular exam Rhythm:Regular Rate:Normal     Neuro/Psych  Neuromuscular disease CVA, No Residual Symptoms    GI/Hepatic Neg liver ROS, GERD  ,  Endo/Other  diabetes, Type 2, Insulin DependentHypothyroidism   Renal/GU Renal InsufficiencyRenal disease   Bladder tumor    Musculoskeletal negative musculoskeletal ROS (+)   Abdominal   Peds  Hematology  (+) Blood dyscrasia (Plavix), ,   Anesthesia Other Findings   Reproductive/Obstetrics                            Anesthesia Physical Anesthesia Plan  ASA: III  Anesthesia Plan: General   Post-op Pain Management:    Induction: Intravenous  PONV Risk Score and Plan: 2 and Ondansetron and Dexamethasone  Airway Management Planned: LMA  Additional Equipment:   Intra-op Plan:   Post-operative Plan: Extubation in OR  Informed Consent: I have reviewed the patients History and Physical, chart, labs and discussed the procedure including the risks, benefits and alternatives for the proposed anesthesia with the patient or authorized representative who has indicated his/her understanding and acceptance.     Dental advisory given  Plan Discussed with: CRNA  Anesthesia Plan Comments:        Anesthesia Quick Evaluation

## 2020-12-29 NOTE — H&P (Signed)
I have bladder cancer.  HPI: David [name] is a 87 year-old male established patient who is here for bladder cancer.    David [name] returns today in f/u for his history of LG NMIBC treated with a TURBT on 02/14/17. He last had cystoscopy in 12/19. He has had no hematuria. He is voiding well with an IPSS of 11. He has some urgency. He has a little stinging around the meatus at times. He is for cystoscopy today. His UA today is unremarkable today.     CC: AUA Questions Scoring.  HPI:     AUA Symptom Score: Less than 20% of the time he has the sensation of not emptying his bladder completely when finished urinating. Less than 50% of the time he has to urinate again fewer than two hours after he has finished urinating. Less than 20% of the time he has to start and stop again several times when he urinates. 50% of the time and more than 50% of the time he finds it difficult to postpone urination. Less than 50% of the time he has a weak urinary stream. He never has to push or strain to begin urination. He has to get up to urinate 1 time from the time he goes to bed until the time he gets up in the morning.   Calculated AUA Symptom Score: 10    ALLERGIES: Doxycycline Zithromax PACK    MEDICATIONS: Crestor 10 mg tablet  Levothyroxine Sodium 125 mcg tablet  Acetaminophen  Advair Diskus 250 mcg-50 mcg/dose blister, with inhalation device  Allegra Allergy 180 mg tablet  Azelastine Hcl  Clopidogrel 75 mg tablet  Combivent Respimat 20 mcg-100 mcg/actuation mist inhaler  Humalog  Ipratropium-Albuterol  Levemir  Mirtazapine 15 mg tablet  Rosuvastatin Calcium  Valsartan-Hydrochlorothiazide 80 mg-12.5 mg tablet  Vitamin D2     GU PSH: Bladder Instill AntiCA Agent - 2018 Complex Uroflow - 2018 Cystoscopy - 12/23/2019, 12/22/2018, 2018, 2018, 2018 Cystoscopy TURBT 2-5 cm - 2018 Locm 300-399Mg /Ml Iodine,1Ml - 2017       PSH Notes: repair of abdominal aneurysm   NON-GU PSH: Partial  colectomy Remove Thyroid     GU PMH: BPH w/LUTS, He is voiding well but has a mildly reduced stream and has no evidence of recurrent UTI. - 2018 History of bladder cancer, He has no recurrent tumors. I will have him return in 6 months for cystoscopy. - 2018 Incomplete bladder emptying (Acute), Rapaflo 8 mg 1 po daily - 2018 Bladder Cancer Trigone , Left - 2018 Gross hematuria - 2018, - 2017      PMH Notes: stomach ulcers     NON-GU PMH: Other specified postprocedural states, Instructed to resolve constipation with LOC. Recommend limiting narcotics which can exacerbate constipation - 2018 Abdominal aortic aneurysm Colon Cancer, History COPD Diabetes Type 2 GERD Gout Hypercholesterolemia Hypertension Hypothyroidism Personal history of malignant neoplasm of thyroid Stroke/TIA    FAMILY HISTORY: Aneurysm - Brother Cancer - Mother Dementia - Sister, Brother Diabetes - Brother Heart Attack - Brother Tuberculosis - Father   SOCIAL HISTORY: Marital Status: Married Preferred Language: English; Ethnicity: Not Hispanic Or Latino; Race: White Current Smoking Status: Patient does not smoke anymore. Has not smoked since 03/27/2005. Smoked for 50 years. Smoked 4+ packs per day.   Tobacco Use Assessment Completed: Used Tobacco in last 30 days? Drinks 4+ caffeinated drinks per day.     Notes: 2 sons, 1 daughter    REVIEW OF SYSTEMS:    GU Review Male:  Patient denies frequent urination, hard to postpone urination, burning/ pain with urination, get up at night to urinate, leakage of urine, stream starts and stops, trouble starting your stream, have to strain to urinate , erection problems, and penile pain.  Gastrointestinal (Upper):   Patient denies nausea, vomiting, and indigestion/ heartburn.  Gastrointestinal (Lower):   Patient denies diarrhea and constipation.  Constitutional:   Patient denies fever, night sweats, weight loss, and fatigue.  Skin:   Patient denies skin rash/ lesion  and itching.  Eyes:   Patient denies blurred vision and double vision.  Ears/ Nose/ Throat:   Patient denies sore throat and sinus problems.  Hematologic/Lymphatic:   Patient denies swollen glands and easy bruising.  Cardiovascular:   Patient denies leg swelling and chest pains.  Respiratory:   Patient denies shortness of breath and cough.  Endocrine:   Patient denies excessive thirst.  Musculoskeletal:   Patient denies back pain and joint pain.  Neurological:   Patient denies headaches and dizziness.  Psychologic:   Patient denies depression and anxiety.   Notes: NO symptoms today    VITAL SIGNS:      11/25/2020 10:14 AM  Weight 183 lb / 83.01 kg  Height 72 in / 182.88 cm  BP 150/69 mmHg  Heart Rate 63 /min  Temperature 97.1 F / 36.1 C  BMI 24.8 kg/m   MULTI-SYSTEM PHYSICAL EXAMINATION:    Constitutional: Well-nourished. No physical deformities. Normally developed. Good grooming.  Respiratory: No labored breathing, no use of accessory muscles.   Cardiovascular: Normal temperature, no swelling, no varicosities.      Complexity of Data:  Records Review:   AUA Symptom Score, Previous Patient Records  Urine Test Review:   Urinalysis   05/23/06 11/27/05  PSA  Total PSA 2.18  1.40     PROCEDURES:         Flexible Cystoscopy - 52000  Risks, benefits, and some of the potential complications of the procedure were discussed. 69ml of 2% lidocaine jelly was instilled intraurethrally.  Cipro 500mg  given for antibiotic prophylaxis.     Meatus:  Normal size. Normal location. Normal condition.  Urethra:  No strictures.  External Sphincter:  Normal.  Verumontanum:  Normal.  Prostate:  Obstructing. Enlarged median lobe. Moderate hyperplasia.  Bladder Neck:  Non-obstructing.  Ureteral Orifices:  Normal location. Normal size. Normal shape. Effluxed clear urine.  Bladder:  Severe trabeculation. A left trigone tumor. < 1/2 cm tumor. Solitary tumor. Normal mucosa. No stones.      The  procedure was well tolerated and there were no complications.         Urinalysis Dipstick Dipstick Cont'd  Color: Yellow Bilirubin: Neg mg/dL  Appearance: Clear Ketones: Neg mg/dL  Specific Gravity: 1.025 Blood: Neg ery/uL  pH: <=5.0 Protein: Neg mg/dL  Glucose: Neg mg/dL Urobilinogen: 0.2 mg/dL    Nitrites: Neg    Leukocyte Esterase: Neg leu/uL    ASSESSMENT:      ICD-10 Details  1 GU:   Bladder Cancer Trigone - C67.0 Undiagnosed New Problem - He has a small recurrence at the left bladder neck. I will get him set up for a cystoscopy with bilateral RTG's, biopsy and fulguration and instillation of gemcitabine. I have reviewed the risks in detail and will have him hold his plavix per routine preop.    PLAN:           Schedule Return Visit/Planned Activity: Next Available Appointment - Schedule Surgery  Note: see posting sheet          Document Letter(s):  Created for Patient: Clinical Summary

## 2020-12-30 ENCOUNTER — Ambulatory Visit (HOSPITAL_COMMUNITY): Payer: Medicare Other

## 2020-12-30 ENCOUNTER — Ambulatory Visit (HOSPITAL_COMMUNITY): Payer: Medicare Other | Admitting: Anesthesiology

## 2020-12-30 ENCOUNTER — Encounter (HOSPITAL_COMMUNITY)
Admission: RE | Disposition: A | Discharge status: Home / Self Care | Payer: Self-pay | Source: Home / Self Care | Attending: Urology

## 2020-12-30 ENCOUNTER — Ambulatory Visit (HOSPITAL_COMMUNITY): Payer: Medicare Other | Admitting: Physician Assistant

## 2020-12-30 ENCOUNTER — Encounter (HOSPITAL_COMMUNITY): Payer: Self-pay | Admitting: Urology

## 2020-12-30 ENCOUNTER — Ambulatory Visit (HOSPITAL_COMMUNITY)
Admission: RE | Admit: 2020-12-30 | Discharge: 2020-12-30 | Disposition: A | Discharge status: Home / Self Care | Payer: Medicare Other | Attending: Urology | Admitting: Urology

## 2020-12-30 DIAGNOSIS — C679 Malignant neoplasm of bladder, unspecified: Secondary | ICD-10-CM | POA: Diagnosis present

## 2020-12-30 DIAGNOSIS — Z881 Allergy status to other antibiotic agents status: Secondary | ICD-10-CM | POA: Diagnosis not present

## 2020-12-30 DIAGNOSIS — Z87891 Personal history of nicotine dependence: Secondary | ICD-10-CM | POA: Insufficient documentation

## 2020-12-30 DIAGNOSIS — C670 Malignant neoplasm of trigone of bladder: Secondary | ICD-10-CM | POA: Insufficient documentation

## 2020-12-30 DIAGNOSIS — Z79899 Other long term (current) drug therapy: Secondary | ICD-10-CM | POA: Diagnosis not present

## 2020-12-30 HISTORY — PX: CYSTOSCOPY W/ RETROGRADES: SHX1426

## 2020-12-30 HISTORY — PX: CYSTOSCOPY WITH BIOPSY: SHX5122

## 2020-12-30 LAB — GLUCOSE, CAPILLARY
Glucose-Capillary: 178 mg/dL — ABNORMAL HIGH (ref 70–99)
Glucose-Capillary: 152 mg/dL — ABNORMAL HIGH (ref 70–99)

## 2020-12-30 LAB — CBC
HCT: 39.8 % (ref 39.0–52.0)
Hemoglobin: 13.5 g/dL (ref 13.0–17.0)
MCH: 33.4 pg (ref 26.0–34.0)
MCHC: 33.9 g/dL (ref 30.0–36.0)
MCV: 98.5 fL (ref 80.0–100.0)
Platelets: 147 10*3/uL — ABNORMAL LOW (ref 150–400)
RBC: 4.04 MIL/uL — ABNORMAL LOW (ref 4.22–5.81)
RDW: 13.7 % (ref 11.5–15.5)
WBC: 5 10*3/uL (ref 4.0–10.5)
nRBC: 0 % (ref 0.0–0.2)

## 2020-12-30 LAB — BASIC METABOLIC PANEL
Anion gap: 9 (ref 5–15)
BUN: 37 mg/dL — ABNORMAL HIGH (ref 8–23)
CO2: 24 mmol/L (ref 22–32)
Calcium: 8.9 mg/dL (ref 8.9–10.3)
Chloride: 105 mmol/L (ref 98–111)
Creatinine, Ser: 1.75 mg/dL — ABNORMAL HIGH (ref 0.61–1.24)
GFR, Estimated: 37 mL/min — ABNORMAL LOW (ref >60)
Glucose, Bld: 181 mg/dL — ABNORMAL HIGH (ref 70–99)
Potassium: 4.1 mmol/L (ref 3.5–5.1)
Sodium: 138 mmol/L (ref 135–145)

## 2020-12-30 LAB — HEMOGLOBIN A1C
Hgb A1c MFr Bld: 7.4 % — ABNORMAL HIGH (ref 4.8–5.6)
Mean Plasma Glucose: 165.68 mg/dL

## 2020-12-30 SURGERY — CYSTOSCOPY, WITH BIOPSY
Anesthesia: General | Site: Ureter

## 2020-12-30 MED ORDER — SODIUM CHLORIDE 0.9 % IR SOLN
Status: DC | PRN
  Administered 2020-12-30: 3000 mL

## 2020-12-30 MED ORDER — IOHEXOL 300 MG/ML  SOLN
INTRAMUSCULAR | Status: DC | PRN
  Administered 2020-12-30: 20 mL

## 2020-12-30 MED ORDER — PROPOFOL 10 MG/ML IV BOLUS
INTRAVENOUS | Status: AC
  Filled 2020-12-30: qty 40

## 2020-12-30 MED ORDER — PHENYLEPHRINE 40 MCG/ML (10ML) SYRINGE FOR IV PUSH (FOR BLOOD PRESSURE SUPPORT)
PREFILLED_SYRINGE | INTRAVENOUS | Status: AC
  Filled 2020-12-30: qty 10

## 2020-12-30 MED ORDER — LACTATED RINGERS IV SOLN: INTRAVENOUS | Status: DC

## 2020-12-30 MED ORDER — LIDOCAINE HCL (PF) 2 % IJ SOLN
INTRAMUSCULAR | Status: AC
  Filled 2020-12-30: qty 5

## 2020-12-30 MED ORDER — PROPOFOL 10 MG/ML IV BOLUS
INTRAVENOUS | Status: DC | PRN
  Administered 2020-12-30: 20 mg via INTRAVENOUS
  Administered 2020-12-30: 30 mg via INTRAVENOUS
  Administered 2020-12-30: 150 mg via INTRAVENOUS

## 2020-12-30 MED ORDER — STERILE WATER FOR IRRIGATION IR SOLN
Status: DC | PRN
  Administered 2020-12-30: 3000 mL

## 2020-12-30 MED ORDER — ONDANSETRON HCL 4 MG/2ML IJ SOLN: 4.0000 mg | Freq: Once | INTRAMUSCULAR | Status: DC | PRN

## 2020-12-30 MED ORDER — SODIUM CHLORIDE 0.9 % IR SOLN
Status: DC | PRN
  Administered 2020-12-30: 1000 mL

## 2020-12-30 MED ORDER — FENTANYL CITRATE (PF) 100 MCG/2ML IJ SOLN
25.0000 ug | INTRAMUSCULAR | Status: DC | PRN
  Administered 2020-12-30: 50 ug via INTRAVENOUS

## 2020-12-30 MED ORDER — ORAL CARE MOUTH RINSE: 15.0000 mL | Freq: Once | OROMUCOSAL | Status: DC

## 2020-12-30 MED ORDER — LIDOCAINE 2% (20 MG/ML) 5 ML SYRINGE
INTRAMUSCULAR | Status: DC | PRN
  Administered 2020-12-30: 60 mg via INTRAVENOUS

## 2020-12-30 MED ORDER — CHLORHEXIDINE GLUCONATE 0.12 % MT SOLN: 15.0000 mL | Freq: Once | OROMUCOSAL | Status: DC

## 2020-12-30 MED ORDER — CHLORHEXIDINE GLUCONATE 0.12 % MT SOLN
15.0000 mL | Freq: Once | OROMUCOSAL | Status: AC
  Administered 2020-12-30: 15 mL via OROMUCOSAL

## 2020-12-30 MED ORDER — FENTANYL CITRATE (PF) 100 MCG/2ML IJ SOLN
INTRAMUSCULAR | Status: DC | PRN
  Administered 2020-12-30 (×4): 25 ug via INTRAVENOUS

## 2020-12-30 MED ORDER — ORAL CARE MOUTH RINSE: 15.0000 mL | Freq: Once | OROMUCOSAL | Status: AC

## 2020-12-30 MED ORDER — PHENYLEPHRINE HCL (PRESSORS) 10 MG/ML IV SOLN
INTRAVENOUS | Status: DC | PRN
  Administered 2020-12-30: 120 ug via INTRAVENOUS
  Administered 2020-12-30: 40 ug via INTRAVENOUS

## 2020-12-30 MED ORDER — ONDANSETRON HCL 4 MG/2ML IJ SOLN
INTRAMUSCULAR | Status: DC | PRN
  Administered 2020-12-30: 4 mg via INTRAVENOUS

## 2020-12-30 MED ORDER — ONDANSETRON HCL 4 MG/2ML IJ SOLN
INTRAMUSCULAR | Status: AC
  Filled 2020-12-30: qty 2

## 2020-12-30 MED ORDER — FENTANYL CITRATE (PF) 100 MCG/2ML IJ SOLN
INTRAMUSCULAR | Status: AC
  Filled 2020-12-30: qty 2

## 2020-12-30 MED ORDER — SUCCINYLCHOLINE CHLORIDE 200 MG/10ML IV SOSY
PREFILLED_SYRINGE | INTRAVENOUS | Status: AC
  Filled 2020-12-30: qty 10

## 2020-12-30 MED ORDER — SODIUM CHLORIDE 0.9% FLUSH: 3.0000 mL | Freq: Two times a day (BID) | INTRAVENOUS | Status: DC

## 2020-12-30 MED ORDER — GEMCITABINE CHEMO FOR BLADDER INSTILLATION 2000 MG
2000.0000 mg | Freq: Once | INTRAVENOUS | Status: AC
  Administered 2020-12-30: 2000 mg via INTRAVESICAL
  Filled 2020-12-30: qty 2000

## 2020-12-30 MED ORDER — CEFAZOLIN SODIUM-DEXTROSE 2-4 GM/100ML-% IV SOLN
2.0000 g | INTRAVENOUS | Status: AC
  Administered 2020-12-30: 2 g via INTRAVENOUS
  Filled 2020-12-30: qty 100

## 2020-12-30 MED ORDER — ACETAMINOPHEN 500 MG PO TABS
1000.0000 mg | ORAL_TABLET | Freq: Once | ORAL | Status: AC
  Administered 2020-12-30: 1000 mg via ORAL
  Filled 2020-12-30: qty 2

## 2020-12-30 MED ORDER — TAMSULOSIN HCL 0.4 MG PO CAPS: 0.4000 mg | ORAL_CAPSULE | Freq: Every day | ORAL | 1 refills | Status: DC

## 2020-12-30 SURGICAL SUPPLY — 20 items
BAG URINE DRAIN 2000ML AR STRL (UROLOGICAL SUPPLIES) ×4 IMPLANT
BAG URO CATCHER STRL LF (MISCELLANEOUS) ×4 IMPLANT
CATH FOLEY 3WAY 30CC 22FR (CATHETERS) IMPLANT
CATH URET 5FR 28IN OPEN ENDED (CATHETERS) ×4 IMPLANT
CLOTH BEACON ORANGE TIMEOUT ST (SAFETY) ×4 IMPLANT
GLOVE SURG SS PI 8.0 STRL IVOR (GLOVE) ×4 IMPLANT
GOWN STRL REUS W/TWL XL LVL3 (GOWN DISPOSABLE) ×4 IMPLANT
GUIDEWIRE STR DUAL SENSOR (WIRE) ×4 IMPLANT
HOLDER FOLEY CATH W/STRAP (MISCELLANEOUS) IMPLANT
KIT TURNOVER KIT A (KITS) IMPLANT
LOOP CUT BIPOLAR 24F LRG (ELECTROSURGICAL) IMPLANT
MANIFOLD NEPTUNE II (INSTRUMENTS) ×4 IMPLANT
PACK CYSTO (CUSTOM PROCEDURE TRAY) ×4 IMPLANT
SUT ETHILON 3 0 PS 1 (SUTURE) IMPLANT
SYR 30ML LL (SYRINGE) IMPLANT
SYR TOOMEY IRRIG 70ML (MISCELLANEOUS)
SYRINGE TOOMEY IRRIG 70ML (MISCELLANEOUS) IMPLANT
TUBING CONNECTING 10 (TUBING) ×3 IMPLANT
TUBING CONNECTING 10' (TUBING) ×1
TUBING UROLOGY SET (TUBING) ×4 IMPLANT

## 2020-12-30 NOTE — Transfer of Care (Signed)
Immediate Anesthesia Transfer of Care Note  Patient: David [name]  Procedure(s) Performed: Procedure(s) (LRB): CYSTOSCOPY WITH BIOPSY AND FULGURATION (N/A) CYSTOSCOPY WITH BILATERAL RETROGRADE (Bilateral)  Patient Location: PACU  Anesthesia Type: General  Level of Consciousness: awake, sedated, patient cooperative and responds to stimulation  Airway & Oxygen Therapy: Patient Spontanous Breathing and Patient connected to [place] 02 and soft FM   Post-op Assessment: Report given to PACU RN, Post -op Vital signs reviewed and stable and Patient moving all extremities  Post vital signs: Reviewed and stable  Complications: No apparent anesthesia complications

## 2020-12-30 NOTE — Interval H&P Note (Signed)
History and Physical Interval Note:  12/30/2020 7:22 AM  David [name]  has presented today for surgery, with the diagnosis of bladder tumor.  The various methods of treatment have been discussed with the patient and family. After consideration of risks, benefits and other options for treatment, the patient has consented to  Procedure(s): CYSTOSCOPY WITH BIOPSY AND FULGURATION  INSTILL GEMCITABINE (N/A) CYSTOSCOPY WITH BILATERAL RETROGRADE (Bilateral) as a surgical intervention.  The patient's history has been reviewed, patient examined, no change in status, stable for surgery.  I have reviewed the patient's chart and labs.  Questions were answered to the patient's satisfaction.     Irine Seal

## 2020-12-30 NOTE — Anesthesia Procedure Notes (Signed)
Procedure Name: LMA Insertion Date/Time: 12/30/2020 7:35 AM Performed by: Justice Rocher, CRNA Pre-anesthesia Checklist: Patient identified, Emergency Drugs available, Suction available, Patient being monitored and Timeout performed Patient Re-evaluated:Patient Re-evaluated prior to induction Oxygen Delivery Method: Circle system utilized Preoxygenation: Pre-oxygenation with 100% oxygen Induction Type: IV induction Ventilation: Mask ventilation without difficulty LMA: LMA inserted LMA Size: 5.0 Number of attempts: 1 Airway Equipment and Method: Bite block Placement Confirmation: positive ETCO2,  breath sounds checked- equal and bilateral and CO2 detector Tube secured with: Tape Dental Injury: Teeth and Oropharynx as per pre-operative assessment  Comments: Dental habits poor

## 2020-12-30 NOTE — Op Note (Signed)
Procedure: 1.  Cystoscopy with biopsy and fulguration of 1 cm left trigone tumor. 2.  Cystoscopy with fulguration of 5 mm mid trigone tumor. 3.  Cystoscopy with bilateral retrograde pyelography and interpretation. 4.  Instillation of gemcitabine in PACU.  Preop diagnosis: Recurrent bladder cancer.  Postop diagnosis: Same.  Surgeon: Dr. Irine Seal.  Anesthesia: General.  Specimen: Biopsies from left trigone lesion.  Drain: 23 [place] Foley catheter.  EBL: None.  Complications: None.  Indications: Mr. David [name] is an 87 year old male with a history of urothelial carcinoma of the bladder who was found on recent surveillance cystoscopy to have a recurrence of the left trigone.  He is to undergo biopsy and fulguration with retrograde pyelography and instillation of gemcitabine.  Procedure: He was given 2 g of Ancef.  A general anesthetic was induced.  He was placed in lithotomy position and fitted with PAS hose.  His perineum and genitalia were prepped with Betadine solution and he was draped in usual sterile fashion.  Cystoscopy was performed using a 23 [place] scope and 30 degree lens.  Examination revealed a normal urethra.  The external sphincter was intact.  The prostatic urethra was approximately 4 to 5 cm in length with trilobar hyperplasia with a moderate middle lobe.  Examination of the bladder demonstrated moderate to severe trabeculation with multiple small cellules.  Lateral to the left ureteral orifice was attached with 2 papillary tumors in area approximately 1 cm.  Subsequently an additional area of 5 mm with papillary fronds was identified in the mid trigone.  A cup biopsy forceps was used to take generous biopsies of the lesions of the left trigone.  The Bugbee electrode was then used to fulgurate the biopsy sites and the surrounding mucosa for an area of fulguration of approximately 1.5 cm.  The left ureteral orifice was avoided.  Bilateral retrograde pyelography was then  performed using a 5 [place] open-ended catheter and Omnipaque.  The left retrograde pyelogram demonstrated J hooking of the distal ureter but an otherwise normal ureter and intrarenal collecting system without filling defects.  The right retrograde pyelogram demonstrated J hooking of the distal ureter but an otherwise normal ureter and intrarenal collecting system without filling defects.  After the retrograde pyelogram was when I noticed the additional papillary lesion of the mid trigone and this was fulgurated with the Bugbee electrode.  A small bleeding point in the prostatic urethra was fulgurated to provide hemostasis.  The scope was then removed and an 20 French Foley catheter was inserted.  The balloon was filled with 10 mL of sterile fluid.  The catheter was placed to straight drainage.  He was taken down from lithotomy position, his anesthetic was reversed and he was moved to recovery in stable condition.  2000 mg of gemcitabine and 50 mL of diluent was instilled in the PACU and left indwelling for 1 hour.  The bladder was then drained and the catheter was placed to a drainage bag.  There were no complications.

## 2020-12-30 NOTE — Anesthesia Postprocedure Evaluation (Signed)
Anesthesia Post Note  Patient: David [name]  Procedure(s) Performed: CYSTOSCOPY WITH BIOPSY AND FULGURATION (N/A Bladder) CYSTOSCOPY WITH BILATERAL RETROGRADE (Bilateral Ureter)     Patient location during evaluation: PACU Anesthesia Type: General Level of consciousness: awake and alert Pain management: pain level controlled Vital Signs Assessment: post-procedure vital signs reviewed and stable Respiratory status: spontaneous breathing, nonlabored ventilation, respiratory function stable and patient connected to nasal cannula oxygen Cardiovascular status: blood pressure returned to baseline and stable Postop Assessment: no apparent nausea or vomiting Anesthetic complications: no   No complications documented.  Last Vitals:  Vitals:   12/30/20 0830 12/30/20 0845  BP: (!) 147/69 (!) 143/65  Pulse: (!) 57 (!) 57  Resp: 13 10  Temp:    SpO2: 95% 97%    Last Pain:  Vitals:   12/30/20 0900  TempSrc:   PainSc: [place]

## 2020-12-30 NOTE — Discharge Instructions (Addendum)
Transurethral Resection of Bladder Tumor, Care After This sheet gives you information about how to care for yourself after your procedure. Your health care provider may also give you more specific instructions. If you have problems or questions, contact your health care provider. What can I expect after the procedure? After the procedure, it is common to have:  A small amount of blood in your urine for up to 2 weeks.  Soreness or mild pain from your catheter. After your catheter is removed, you may have mild soreness, especially when urinating.  Pain in your lower abdomen. Follow these instructions at home: Medicines   Take over-the-counter and prescription medicines only as told by your health care provider.  If you were prescribed an antibiotic medicine, take it as told by your health care provider. Do not stop taking the antibiotic even if you start to feel better.  Do not drive for 24 hours if you were given a sedative during your procedure.  Ask your health care provider if the medicine prescribed to you: ? Requires you to avoid driving or using heavy machinery. ? Can cause constipation. You may need to take these actions to prevent or treat constipation:  Take over-the-counter or prescription medicines.  Eat foods that are high in fiber, such as beans, whole grains, and fresh fruits and vegetables.  Limit foods that are high in fat and processed sugars, such as fried or sweet foods. Activity  Return to your normal activities as told by your health care provider. Ask your health care provider what activities are safe for you.  Do not lift anything that is heavier than 10 lb (4.5 kg), or the limit that you are told, until your health care provider says that it is safe.  Avoid intense physical activity for as long as told by your health care provider.  Rest as told by your health care provider.  Avoid sitting for a long time without moving. Get up to take short walks every  1-2 hours. This is important to improve blood flow and breathing. Ask for help if you feel weak or unsteady. General instructions   Do not drink alcohol for as long as told by your health care provider. This is especially important if you are taking prescription pain medicines.  Do not take baths, swim, or use a hot tub until your health care provider approves. Ask your health care provider if you may take showers. You may only be allowed to take sponge baths.  If you have a catheter, follow instructions from your health care provider about caring for your catheter and your drainage bag.  Drink enough fluid to keep your urine pale yellow.  Wear compression stockings as told by your health care provider. These stockings help to prevent blood clots and reduce swelling in your legs.  Keep all follow-up visits as told by your health care provider. This is important. ? You will need to be followed closely with regular checks of your bladder and urethra (cystoscopies) to make sure that the cancer does not come back. Contact a health care provider if:  You have pain that gets worse or does not improve with medicine.  You have blood in your urine for more than 2 weeks.  You have cloudy or bad-smelling urine.  You become constipated. Signs of constipation may include having: ? Fewer than three bowel movements in a week. ? Difficulty having a bowel movement. ? Stools that are dry, hard, or larger than normal.  You have  a fever. Get help right away if:  You have: ? Severe pain. ? Bright red blood in your urine. ? Blood clots in your urine. ? A lot of blood in your urine.  Your catheter has been removed and you are not able to urinate.  You have a catheter in place and the catheter is not draining urine. Summary  After your procedure, it is common to have a small amount of blood in your urine, soreness or mild pain from your catheter, and pain in your lower abdomen.  Take  over-the-counter and prescription medicines only as told by your health care provider.  Rest as told by your health care provider. Follow your health care provider's instructions about returning to normal activities. Ask what activities are safe for you.  If you have a catheter, follow instructions from your health care provider about caring for your catheter and your drainage bag.  Get help right away if you cannot urinate, you have severe pain, or you have bright red blood or blood clots in your urine.  You may resume the plavix in 3 days.   You may remove the catheter in the morning by either cutting off the red nipple or using a syringe to drain the balloon.  The catheter should easily slide out once the fluid drains.  If you don't feel comfortable doing that, please call the office Monday to have it removed.   This information is not intended to replace advice given to you by your health care provider. Make sure you discuss any questions you have with your health care provider. Document Revised: 07/10/2018 Document Reviewed: 07/10/2018 Elsevier Patient Education  [place].

## 2020-12-31 ENCOUNTER — Encounter (HOSPITAL_COMMUNITY): Payer: Self-pay | Admitting: Urology

## 2021-01-02 LAB — SURGICAL PATHOLOGY

## 2021-01-21 ENCOUNTER — Other Ambulatory Visit: Payer: Self-pay | Admitting: Urology

## 2021-02-17 ENCOUNTER — Ambulatory Visit: Payer: Medicare Other | Admitting: Podiatry

## 2021-03-05 ENCOUNTER — Other Ambulatory Visit: Payer: Self-pay | Admitting: Urology

## 2021-03-30 ENCOUNTER — Other Ambulatory Visit: Payer: Self-pay | Admitting: Urology

## 2021-04-23 NOTE — Progress Notes (Signed)
Cardiology Office Note:    Date:  04/24/2021   ID:  David [name], DOB February 02, 1931, MRN [id number]  PCP:  [name], MD   [place]  Cardiologist:  Werner Lean, MD  Saw Dr. Acie Fredrickson 2015. Advanced Practice Provider:  No care team member to display Electrophysiologist:  None       Referring MD: [name], MD   CC: Check out Consulted for the evaluation of PVCs and 1st AV block at the behest of [name], MD  History of Present Illness:    RICKARDO [name] is a 87 y.o. male with a hx of Thoracic Aortic Aneurystm 4.1 and stable, AAA s/p Repair, HTN and Aortic Atherosclerosis HLD with DM, COPD, CKD STAGE IIIa, strokes in 2006 on plavix who presents for evaluation 04/24/21.  Patient notes that he is feeling pretty good.  1st of the year had bladder surgery ([place]); then had BCG treatment.   Felt like his legs were weak and felt fatigue. Notes that he has pinpoint chest tightness that occurs spontaneously and quickly resolves.  Patient exertion notable for helping build a deck and taking care of his land with  and feels no symptoms.  Notes shortness of breath that hasn't changed (has known COPD).  No PND or orthopnea.  No bendopnea, weight gain, leg swelling , or abdominal swelling.  No syncope or near syncope. Notes no palpitations or funny heart beats- did have PVCs post [place] in recovery.   Notes bruising on plavix but no blood in urine, stool, and coughing up blood.  Incidentally notes with that reaching up has sciatic pain. No claudication or rest pain.  Wife notes that he has a history   Ambulatory BP not done.   Past Medical History:  Diagnosis Date  . AAA (abdominal aortic aneurysm) ([place])   . AAA (abdominal aortic aneurysm) ([place])   . AAA (abdominal aortic aneurysm) ([place])   . Atherosclerosis    pt denies this  . BPH (benign prostatic hyperplasia)   . CKD (chronic kidney disease), stage III [facility])    family reports caused by scans  but that has resolved since CT scans have stopped  . Colon cancer ([place])   . COPD (chronic obstructive pulmonary disease) ([place])   . Diabetes mellitus   . ED (erectile dysfunction)   . Gastroesophageal reflux disease   . Heart murmur   . Hyperlipidemia   . Hypertension   . Lung nodules   . Neuromuscular disorder (HCC)    Neuropathy feet  . Osteopenia   . Peripheral vascular disease ([place])   . Pneumonia June 2013  . Positional vertigo    intermittent   . Shortness of breath    chronic per patient  . Stroke ([place])    x2 in 2006  . Stroke ([place])   . Thyroid cancer ([place])   . Thyroid disease   . Type 2 diabetes mellitus ([place])   . Vitamin D deficiency     Past Surgical History:  Procedure Laterality Date  . ABDOMINAL AORTIC ANEURYSM REPAIR N/A 11/08/2014   Procedure: ANEURYSM ABDOMINAL AORTIC REPAIR;  Surgeon: Rosetta Posner, MD;  Location: [place];  Service: Vascular;  Laterality: N/A;  . ABDOMINAL AORTIC ANEURYSM REPAIR    . COLON RESECTION  2007  . COLON SURGERY  2006   Resection  . CYSTOSCOPY W/ RETROGRADES Bilateral 12/30/2020   Procedure: CYSTOSCOPY WITH BILATERAL RETROGRADE;  Surgeon: Irine Seal, MD;  Location: [facility];  Service:  Urology;  Laterality: Bilateral;  . CYSTOSCOPY WITH BIOPSY N/A 12/30/2020   Procedure: CYSTOSCOPY WITH BIOPSY AND FULGURATION;  Surgeon: Irine Seal, MD;  Location: [facility];  Service: Urology;  Laterality: N/A;  . SEPTOPLASTY    . THYROIDECTOMY  2007  . TRANSURETHRAL RESECTION OF BLADDER TUMOR WITH MITOMYCIN-C N/A 02/12/2017   Procedure: TRANSURETHRAL RESECTION OF BLADDER TUMOR;  Surgeon: Irine Seal, MD;  Location: [facility];  Service: Urology;  Laterality: N/A;    Current Medications: Current Meds  Medication Sig  . acetaminophen (TYLENOL) 500 MG tablet Take 500-1,000 mg by mouth at bedtime. Take 500 mg daily at bedtime and may take an additional 500mg  tablet if needed for pain  . allopurinol (ZYLOPRIM) 100 MG tablet Take 150 mg by mouth daily.  .  clopidogrel (PLAVIX) 75 MG tablet Take 75 mg by mouth at bedtime.   . COMBIVENT RESPIMAT 20-100 MCG/ACT AERS respimat Inhale 1 puff into the lungs in the morning and at bedtime.  . Fluticasone-Salmeterol (ADVAIR) 250-50 MCG/DOSE AEPB Inhale 1 puff into the lungs every 12 (twelve) hours.  Marland Kitchen HUMALOG KWIKPEN 100 UNIT/ML KwikPen Inject 3 Units into the skin in the morning and at bedtime. Pt uses humalog in am , suppertime  Pt does 3 untis each time  . Lancets (ONETOUCH DELICA PLUS [id number]) MISC   . LEVEMIR FLEXTOUCH 100 UNIT/ML Pen Inject 6 Units into the skin at bedtime.   Marland Kitchen levothyroxine (SYNTHROID, LEVOTHROID) 125 MCG tablet Take 125 mcg by mouth daily before breakfast. 62.5 mcg on Sundays, 125 mcg all other days  . LORazepam (ATIVAN) 0.5 MG tablet Take 0.25-0.5 mg by mouth as needed.  . mirtazapine (REMERON SOL-TAB) 15 MG disintegrating tablet Take 15 mg by mouth at bedtime.  Glory Rosebush ULTRA test strip   . rosuvastatin (CRESTOR) 10 MG tablet Take 10 mg by mouth at bedtime.  . tamsulosin (FLOMAX) 0.4 MG CAPS capsule Take 0.4 mg by mouth as needed.  . triamcinolone cream (KENALOG) 0.1 % daily.  . valsartan-hydrochlorothiazide (DIOVAN-HCT) 80-12.5 MG tablet Take 1 tablet by mouth every morning.  . Vitamin D, Ergocalciferol, (DRISDOL) 50000 UNITS CAPS Take 50,000 Units by mouth every Monday.   . [DISCONTINUED] tamsulosin (FLOMAX) 0.4 MG CAPS capsule TAKE 1 CAPSULE BY MOUTH EVERY DAY (Patient taking differently: as needed.)   Current Facility-Administered Medications for the 04/24/21 encounter (Office Visit) with Werner Lean, MD  Medication  . 0.9 %  sodium chloride infusion     Allergies:   Azithromycin and Doxycycline   Social History   Socioeconomic History  . Marital status: Married    Spouse name: Not on file  . Number of children: Not on file  . Years of education: Not on file  . Highest education level: Not on file  Occupational History  . Occupation: Retired     Fish farm manager: RETIRED  Tobacco Use  . Smoking status: Former Smoker    Packs/day: 1.50    Years: 50.00    Pack years: 75.00    Types: Cigarettes    Quit date: 05/28/2005    Years since quitting: 15.9  . Smokeless tobacco: Never Used  Vaping Use  . Vaping Use: Never used  Substance and Sexual Activity  . Alcohol use: No    Alcohol/week: 0.0 standard drinks  . Drug use: No  . Sexual activity: Not Currently  Other Topics Concern  . Not on file  Social History Narrative  . Not on file   Social Determinants of Health  Financial Resource Strain: Not on file  Food Insecurity: Not on file  Transportation Needs: Not on file  Physical Activity: Not on file  Stress: Not on file  Social Connections: Not on file     Family History: The patient's family history includes Breast cancer in his mother; Diabetes in his brother and paternal uncle; Heart attack in his brother; Heart disease in his brother; Heart failure in his mother; Pneumonia in his father; Stomach cancer in his mother.  ROS:   Please see the history of present illness.    All other systems reviewed and are negative.  EKGs/Labs/Other Studies Reviewed:    The following studies were reviewed today:  EKG:  EKG is  ordered today.  The ekg ordered today demonstrates  04/24/21: SR rate 71 1st HB no PVCs 04/17/21: SR rate 63 with PACs, and PVCs  Transthoracic Echocardiogram: Date: 06/14/2012 Results: Study Conclusions   - Left ventricle: The cavity size was normal. Wall thickness  was increased in a pattern of mild LVH. Systolic function  was normal. The estimated ejection fraction was in the  range of 60% to 65%. Regional wall motion abnormalities  cannot be excluded. Doppler parameters are consistent with  abnormal left ventricular relaxation (grade 1 diastolic  dysfunction).  - Aortic valve: Mild regurgitation.  - Mitral valve: Mild regurgitation.  - Right ventricle: The cavity size was mildly dilated. Wall   thickness was normal.  - Right atrium: The atrium was mildly dilated.  - Pulmonary arteries: Systolic pressure was mildly to  moderately increased. PA peak pressure: 8mm Hg (S).   Non Cardiac CT: Date: 08/17/20 Results: Significant aortic atherosclerosis CAC in LAD and LCx   NM Stress Testing: Date: 10/08/2014 Results: Low risk myoveiw   Recent Labs: 12/30/2020: BUN 37; Creatinine, Ser 1.75; Hemoglobin 13.5; Platelets 147; Potassium 4.1; Sodium 138  Recent Lipid Panel    Component Value Date/Time   CHOL 92 06/14/2012 0725   TRIG 66 06/14/2012 0725   HDL 34 (L) 06/14/2012 0725   CHOLHDL 2.7 06/14/2012 0725   VLDL 13 06/14/2012 0725   LDLCALC 45 06/14/2012 0725    Risk Assessment/Calculations:     N/A  Physical Exam:    VS:  BP 130/68   Pulse 71   Ht 6' (1.829 m)   Wt 180 lb (81.6 kg)   SpO2 96%   BMI 24.41 kg/m     Wt Readings from Last 3 Encounters:  04/24/21 180 lb (81.6 kg)  12/30/20 181 lb (82.1 kg)  12/21/20 181 lb (82.1 kg)     GEN: Elderly male well developed in no acute distress HEENT: Normal NECK: No JVD; No carotid bruits LYMPHATICS: No lymphadenopathy CARDIAC: RRR, no murmurs, rubs, gallops RESPIRATORY:  Clear to auscultation without rales, wheezing or rhonchi  ABDOMEN: Soft, non-tender, non-distended MUSCULOSKELETAL:  No edema; No deformity  SKIN: Warm and dry NEUROLOGIC:  Alert and oriented x 3 PSYCHIATRIC:  Normal affect   ASSESSMENT:    1. PVC (premature ventricular contraction)   2. PAC (premature atrial contraction)   3. Thoracic aortic aneurysm without rupture ([place])   4. Abdominal aortic aneurysm without rupture ([place])   5. Aortic atherosclerosis ([place])   6. History of stroke   7. Chronic obstructive pulmonary disease, unspecified COPD type ([place])   8. 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  Concerns regarding medicines are outlined above.  Orders Placed This Encounter  Procedures  . LONG TERM MONITOR (3-14 DAYS)  . EKG 12-Lead  . ECHOCARDIOGRAM COMPLETE   No orders of the defined types were placed in this encounter.   Patient Instructions  Medication Instructions:  Your physician recommends that you continue on your current medications as directed. Please refer to the Current Medication list given to you today.  *If you need a refill on your cardiac medications before your next appointment, please call your pharmacy*   Lab Work: NONE If you have labs (blood work) drawn today and your tests are completely normal, you will receive your results only by: Marland Kitchen MyChart Message (if you have MyChart) OR . A paper copy in the mail If you have any lab test that is abnormal or we need to change your treatment, we will call you to review the results.   Testing/Procedures: Your physician has requested that you have an echocardiogram. Echocardiography is a painless test that uses sound waves to create images of your heart. It provides your doctor with information about the size and shape of your heart and how well your heart's chambers and valves are working. This procedure takes  approximately one hour. There are no restrictions for this procedure.  Your physician has requested that you wear a heart monitor.     Follow-Up: At [facility], you and your health needs are our priority.  As part of our continuing mission to provide you with exceptional heart care, we have created designated Provider Care Teams.  These Care Teams include your primary Cardiologist (physician) and Advanced Practice Providers (APPs -  Physician Assistants and Nurse Practitioners) who all work together to provide you with the care you need, when you need it.  We recommend signing up for the patient portal called "MyChart".  Sign up information is provided on this After Visit Summary.  MyChart is used to connect with patients for Virtual Visits (Telemedicine).  Patients are able to view lab/test results, encounter notes, upcoming appointments, etc.  Non-urgent messages can be sent to your provider as well.   To learn more about what you can do with MyChart, go to [email or web address].    Your next appointment:   5-6 month(s)  The format for your next appointment:   In Person  Provider:   You may see Werner Lean, MD or one of the following Advanced Practice Providers on your designated Care Team:    Melina Copa, PA-C  Ermalinda Barrios, PA-C  OTHER INSTRUCTIONS  [place] Monitor Instructions   Your physician has requested you wear a ZIO patch monitor for _14__ days.  This is a single patch monitor.   IRhythm supplies one patch monitor  per enrollment. Additional stickers are not available. Please do not apply patch if you will be having a Nuclear Stress Test, Echocardiogram, Cardiac CT, MRI, or Chest Xray during the period you would be wearing the monitor. The patch cannot be worn during these tests. You cannot remove and re-apply the ZIO XT patch monitor.  Your ZIO patch monitor will be sent Fed Ex from [employer] directly to your home address. It may take  3-5 days to receive your monitor after you have been enrolled.  Once you have received your monitor, please review the enclosed instructions. Your monitor has already been registered assigning a specific monitor serial # to you.  Billing and Patient Assistance Program Information   We have supplied [employer] with any of your insurance information on file for billing purposes. [employer] offers a sliding scale Patient Assistance Program for patients that do not have insurance, or whose insurance does not completely cover the cost of the ZIO monitor.   You must apply for the Patient Assistance Program to qualify for this discounted rate.     To apply, please call [employer] at [phone], select option 4, then select option 2, and ask to apply for Patient Assistance Program.  Theodore Demark will ask your household income, and how many people are in your household.  They will quote your out-of-pocket cost based on that information.  [employer] will also be able to set up a 27-month, interest-free payment plan if needed.  Applying the monitor   Shave hair from upper left chest.  Hold abrader disc by orange tab. Rub abrader in 40 strokes over the upper left chest as indicated in your monitor instructions.  Clean area with 4 enclosed alcohol pads. Let dry.  Apply patch as indicated in monitor instructions. Patch will be placed under collarbone on left side of chest with arrow pointing upward.  Rub patch adhesive wings for 2 minutes. Remove white label marked "1". Remove the white label marked "2". Rub patch adhesive wings for 2 additional minutes.  While looking in a mirror, press and release button in center of patch. A small green light will flash 3-4 times. This will be your only indicator that the monitor has been turned on. ?  Do not shower for the first 24 hours. You may shower after the first 24 hours.  Press the button if you feel a symptom. You will hear a small click. Record Date, Time and Symptom in the  Patient Logbook.  When you are ready to remove the patch, follow instructions on the last 2 pages of the Patient Logbook. Stick patch monitor onto the last page of Patient Logbook.  Place Patient Logbook in the blue and white box.  Use locking tab on box and tape box closed securely.  The blue and white box has prepaid postage on it. Please place it in the mailbox as soon as possible. Your physician should have your test results approximately 7 days after the monitor has been mailed back to [facility].  Call [place] at [phone] if you have questions regarding your ZIO XT patch monitor. Call them immediately if you see an orange light blinking on your monitor.  If your monitor falls off in less than 4 days, contact our Monitor department at [phone]. ?If your monitor becomes loose or falls off after 4 days call [employer] at [phone] for suggestions on securing your monitor.?        Signed, Werner Lean, MD  04/24/2021 11:22 AM  [facility]

## 2021-04-24 ENCOUNTER — Encounter: Payer: Self-pay | Admitting: Internal Medicine

## 2021-04-24 ENCOUNTER — Encounter: Payer: Self-pay | Admitting: Radiology

## 2021-04-24 ENCOUNTER — Ambulatory Visit (INDEPENDENT_AMBULATORY_CARE_PROVIDER_SITE_OTHER): Payer: Medicare Other

## 2021-04-24 ENCOUNTER — Other Ambulatory Visit: Payer: Self-pay

## 2021-04-24 ENCOUNTER — Ambulatory Visit: Payer: Medicare Other | Admitting: Internal Medicine

## 2021-04-24 VITALS — BP 130/68 | HR 71 | Ht 72.0 in | Wt 180.0 lb

## 2021-04-24 DIAGNOSIS — Z8673 Personal history of transient ischemic attack (TIA), and cerebral infarction without residual deficits: Secondary | ICD-10-CM

## 2021-04-24 DIAGNOSIS — I714 Abdominal aortic aneurysm, without rupture, unspecified: Secondary | ICD-10-CM

## 2021-04-24 DIAGNOSIS — I493 Ventricular premature depolarization: Secondary | ICD-10-CM | POA: Diagnosis not present

## 2021-04-24 DIAGNOSIS — I491 Atrial premature depolarization: Secondary | ICD-10-CM | POA: Diagnosis not present

## 2021-04-24 DIAGNOSIS — I712 Thoracic aortic aneurysm, without rupture, unspecified: Secondary | ICD-10-CM

## 2021-04-24 DIAGNOSIS — E1159 Type 2 diabetes mellitus with other circulatory complications: Secondary | ICD-10-CM | POA: Insufficient documentation

## 2021-04-24 DIAGNOSIS — I152 Hypertension secondary to endocrine disorders: Secondary | ICD-10-CM

## 2021-04-24 DIAGNOSIS — I700 Atherosclerosis of aorta: Secondary | ICD-10-CM | POA: Insufficient documentation

## 2021-04-24 DIAGNOSIS — J449 Chronic obstructive pulmonary disease, unspecified: Secondary | ICD-10-CM

## 2021-04-24 NOTE — Patient Instructions (Signed)
Medication Instructions:  Your physician recommends that you continue on your current medications as directed. Please refer to the Current Medication list given to you today.  *If you need a refill on your cardiac medications before your next appointment, please call your pharmacy*   Lab Work: NONE If you have labs (blood work) drawn today and your tests are completely normal, you will receive your results only by: Marland Kitchen MyChart Message (if you have MyChart) OR . A paper copy in the mail If you have any lab test that is abnormal or we need to change your treatment, we will call you to review the results.   Testing/Procedures: Your physician has requested that you have an echocardiogram. Echocardiography is a painless test that uses sound waves to create images of your heart. It provides your doctor with information about the size and shape of your heart and how well your heart's chambers and valves are working. This procedure takes approximately one hour. There are no restrictions for this procedure.  Your physician has requested that you wear a heart monitor.     Follow-Up: At [facility], you and your health needs are our priority.  As part of our continuing mission to provide you with exceptional heart care, we have created designated Provider Care Teams.  These Care Teams include your primary Cardiologist (physician) and Advanced Practice Providers (APPs -  Physician Assistants and Nurse Practitioners) who all work together to provide you with the care you need, when you need it.  We recommend signing up for the patient portal called "MyChart".  Sign up information is provided on this After Visit Summary.  MyChart is used to connect with patients for Virtual Visits (Telemedicine).  Patients are able to view lab/test results, encounter notes, upcoming appointments, etc.  Non-urgent messages can be sent to your provider as well.   To learn more about what you can do with MyChart, go to  [email or web address].    Your next appointment:   5-6 month(s)  The format for your next appointment:   In Person  Provider:   You may see Werner Lean, MD or one of the following Advanced Practice Providers on your designated Care Team:    Melina Copa, PA-C  Ermalinda Barrios, PA-C  OTHER INSTRUCTIONS  [place] Monitor Instructions   Your physician has requested you wear a ZIO patch monitor for _14__ days.  This is a single patch monitor.   IRhythm supplies one patch monitor per enrollment. Additional stickers are not available. Please do not apply patch if you will be having a Nuclear Stress Test, Echocardiogram, Cardiac CT, MRI, or Chest Xray during the period you would be wearing the monitor. The patch cannot be worn during these tests. You cannot remove and re-apply the ZIO XT patch monitor.  Your ZIO patch monitor will be sent Fed Ex from [employer] directly to your home address. It may take 3-5 days to receive your monitor after you have been enrolled.  Once you have received your monitor, please review the enclosed instructions. Your monitor has already been registered assigning a specific monitor serial # to you.  Billing and Patient Assistance Program Information   We have supplied [employer] with any of your insurance information on file for billing purposes. [employer] offers a sliding scale Patient Assistance Program for patients that do not have insurance, or whose insurance does not completely cover the cost of the ZIO monitor.   You must apply for the Patient Assistance  Program to qualify for this discounted rate.     To apply, please call [employer] at [phone], select option 4, then select option 2, and ask to apply for Patient Assistance Program.  Theodore Demark will ask your household income, and how many people are in your household.  They will quote your out-of-pocket cost based on that information.  [employer] will also be able to set up a 66-month,  interest-free payment plan if needed.  Applying the monitor   Shave hair from upper left chest.  Hold abrader disc by orange tab. Rub abrader in 40 strokes over the upper left chest as indicated in your monitor instructions.  Clean area with 4 enclosed alcohol pads. Let dry.  Apply patch as indicated in monitor instructions. Patch will be placed under collarbone on left side of chest with arrow pointing upward.  Rub patch adhesive wings for 2 minutes. Remove white label marked "1". Remove the white label marked "2". Rub patch adhesive wings for 2 additional minutes.  While looking in a mirror, press and release button in center of patch. A small green light will flash 3-4 times. This will be your only indicator that the monitor has been turned on. ?  Do not shower for the first 24 hours. You may shower after the first 24 hours.  Press the button if you feel a symptom. You will hear a small click. Record Date, Time and Symptom in the Patient Logbook.  When you are ready to remove the patch, follow instructions on the last 2 pages of the Patient Logbook. Stick patch monitor onto the last page of Patient Logbook.  Place Patient Logbook in the blue and white box.  Use locking tab on box and tape box closed securely.  The blue and white box has prepaid postage on it. Please place it in the mailbox as soon as possible. Your physician should have your test results approximately 7 days after the monitor has been mailed back to [facility].  Call [place] at [phone] if you have questions regarding your ZIO XT patch monitor. Call them immediately if you see an orange light blinking on your monitor.  If your monitor falls off in less than 4 days, contact our Monitor department at [phone]. ?If your monitor becomes loose or falls off after 4 days call [employer] at [phone] for suggestions on securing your monitor.?

## 2021-04-24 NOTE — Progress Notes (Signed)
Enrolled patient for a 14 day Zio XT Monitor to be mailed to patients home  

## 2021-04-29 DIAGNOSIS — I712 Thoracic aortic aneurysm, without rupture: Secondary | ICD-10-CM

## 2021-04-29 DIAGNOSIS — I491 Atrial premature depolarization: Secondary | ICD-10-CM

## 2021-04-29 DIAGNOSIS — I493 Ventricular premature depolarization: Secondary | ICD-10-CM | POA: Diagnosis not present

## 2021-05-15 ENCOUNTER — Other Ambulatory Visit: Payer: Self-pay

## 2021-05-15 ENCOUNTER — Encounter: Payer: Self-pay | Admitting: Podiatry

## 2021-05-15 ENCOUNTER — Ambulatory Visit: Payer: Medicare Other | Admitting: Podiatry

## 2021-05-15 DIAGNOSIS — E1142 Type 2 diabetes mellitus with diabetic polyneuropathy: Secondary | ICD-10-CM

## 2021-05-15 DIAGNOSIS — D689 Coagulation defect, unspecified: Secondary | ICD-10-CM

## 2021-05-15 DIAGNOSIS — M79676 Pain in unspecified toe(s): Secondary | ICD-10-CM | POA: Diagnosis not present

## 2021-05-15 DIAGNOSIS — B351 Tinea unguium: Secondary | ICD-10-CM | POA: Diagnosis not present

## 2021-05-15 DIAGNOSIS — Q828 Other specified congenital malformations of skin: Secondary | ICD-10-CM

## 2021-05-15 NOTE — Progress Notes (Signed)
This patient returns to my office for at risk foot care.  This patient requires this care by a professional since this patient will be at risk due to having chronic kidney disease, thrombocytopenia and type 2 diabetes.   Patient is taking plavix.    This patient is unable to cut nails himself since the patient cannot reach his nails.These nails are painful walking and wearing shoes.   Patient says the callus on his left forefoot is painful walking barefoot. This patient presents for at risk foot care today.  ° °General Appearance  Alert, conversant and in no acute stress. ° °Vascular  Dorsalis pedis and posterior tibial  pulses are weakly  palpable  bilaterally.  Capillary return is within normal limits  bilaterally. Temperature is within normal limits  Bilaterally.  Venous stasis  B/L. ° °Neurologic  Senn-Weinstein monofilament wire test absent   bilaterally. Muscle power within normal limits bilaterally. ° °Nails Thick disfigured discolored nails with subungual debris  from hallux to fifth toes bilaterally. No evidence of bacterial infection or drainage bilaterally. ° °Orthopedic  No limitations of motion  feet .  No crepitus or effusions noted.  HAV  B/L.  Hammer toes  B/L.  Exostosis IPJ left hallux. ° °Skin  normotropic skin with no porokeratosis noted bilaterally.  No signs of infections or ulcers noted.   Porokeratosis sub 5th  Left foot. ° °Onychomycosis  Pain in right toes  Pain in left toes ° °Consent was obtained for treatment procedures.   Mechanical debridement of nails 1-5  bilaterally performed with a nail nipper.  Filed with dremel without incident. Debride porokeratosis with # 15 blade. ° ° °Return office visit   10 weeks                  Told patient to return for periodic foot care and evaluation due to potential at risk complications. ° ° °Dilia Alemany DPM  °

## 2021-05-19 ENCOUNTER — Other Ambulatory Visit: Payer: Self-pay

## 2021-05-19 ENCOUNTER — Ambulatory Visit (HOSPITAL_COMMUNITY): Payer: Medicare Other | Attending: Cardiology

## 2021-05-19 DIAGNOSIS — I493 Ventricular premature depolarization: Secondary | ICD-10-CM | POA: Diagnosis not present

## 2021-05-19 DIAGNOSIS — I712 Thoracic aortic aneurysm, without rupture, unspecified: Secondary | ICD-10-CM

## 2021-05-19 DIAGNOSIS — I491 Atrial premature depolarization: Secondary | ICD-10-CM | POA: Insufficient documentation

## 2021-05-19 LAB — ECHOCARDIOGRAM COMPLETE
Area-P 1/2: 1.16 cm2
P 1/2 time: 974 msec
S' Lateral: 1.5 cm

## 2021-05-24 ENCOUNTER — Telehealth: Payer: Self-pay

## 2021-05-24 MED ORDER — DILTIAZEM HCL ER COATED BEADS 120 MG PO CP24: 120.0000 mg | ORAL_CAPSULE | Freq: Every day | ORAL | 3 refills | Status: DC

## 2021-05-24 NOTE — Telephone Encounter (Signed)
-----   Message from Werner Lean, MD sent at 05/24/2021  9:59 AM EDT ----- Results: Frequent PVCs Plan: Will start diltiazem 120 mg XL PO Daily Will need Fall follow up on medication given nocturnal HB (red flag symptoms of fatigue, weakness, near syncope or syncope would warrant DC of new medication and EP follow up)  Werner Lean, MD

## 2021-05-24 NOTE — Telephone Encounter (Signed)
Notified patient and wife of results and MD recommendations.  Pt is agreeable to plan.  I reviewed symptoms that would warrant pt stopping medication and calling the office.  Wife verbalized understanding.  All questions have been answered.

## 2021-06-07 ENCOUNTER — Other Ambulatory Visit: Payer: Self-pay | Admitting: *Deleted

## 2021-06-07 DIAGNOSIS — I739 Peripheral vascular disease, unspecified: Secondary | ICD-10-CM

## 2021-06-07 DIAGNOSIS — I6523 Occlusion and stenosis of bilateral carotid arteries: Secondary | ICD-10-CM

## 2021-07-05 NOTE — Progress Notes (Signed)
HISTORY AND PHYSICAL     CC:  follow up. Requesting Provider:  [name], MD  HPI: This is a 87 y.o. male who is here today for follow up and is pt of Dr. Donnetta Hutching and has hx of open resection and grafting of juxtarenal abdominal aortic aneurysm with straight graft repair with 60mm Hemashield graft on 11/08/2014 by Dr. Donnetta Hutching.  He was discharged on POD 4.   Pt was last seen on 12/11/2019.  At that time, he was doing well without claudication sx.  His carotid duplex in 2019 was 1-39% bilateral ICA stenosis.  He did have duplex of the left popliteal artery, which revealed no aneurysm.  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 Cyndia Bent in 2021.    The pt returns today for follow up and is here with his wife of almost 39 years.   Pt denies any amaurosis fugax, speech difficulties, weakness, numbness, paralysis or clumsiness or facial droop.    Pt denies claudication, rest pain, or non healing wounds.   He states he does have some neuropathy in his feet from diabetes.  He continues to have some left sided weakness from stroke many years ago.    He has been diagnosed with bladder cancer earlier this year.  The pt is on a statin for cholesterol management.    The pt is not on an aspirin.    Other AC:  Plavix The pt is on CCB for hypertension.  The pt does have diabetes. Tobacco hx:  former      Past Medical History:  Diagnosis Date   AAA (abdominal aortic aneurysm) ([place])    AAA (abdominal aortic aneurysm) ([place])    AAA (abdominal aortic aneurysm) ([place])    Atherosclerosis    pt denies this   BPH (benign prostatic hyperplasia)    CKD (chronic kidney disease), stage III (HCC)    family reports caused by scans but that has resolved since CT scans have stopped   Colon cancer ([place])    COPD (chronic obstructive pulmonary disease) ([place])    Diabetes mellitus    ED (erectile dysfunction)    Gastroesophageal reflux  disease    Heart murmur    Hyperlipidemia    Hypertension    Lung nodules    Neuromuscular disorder ([place])    Neuropathy feet   Osteopenia    Peripheral vascular disease ([place])    Pneumonia June 2013   Positional vertigo    intermittent    Shortness of breath    chronic per patient   Stroke [facility])    x2 in 2006   Stroke [facility])    Thyroid cancer ([place])    Thyroid disease    Type 2 diabetes mellitus ([place])    Vitamin D deficiency     Past Surgical History:  Procedure Laterality Date   ABDOMINAL AORTIC ANEURYSM REPAIR N/A 11/08/2014   Procedure: ANEURYSM ABDOMINAL AORTIC REPAIR;  Surgeon: Rosetta Posner, MD;  Location: [facility];  Service: Vascular;  Laterality: N/A;   ABDOMINAL AORTIC ANEURYSM REPAIR     COLON RESECTION  2007   COLON SURGERY  2006   Resection   CYSTOSCOPY W/ RETROGRADES Bilateral 12/30/2020   Procedure: CYSTOSCOPY WITH BILATERAL RETROGRADE;  Surgeon: Irine Seal, MD;  Location: [facility];  Service: Urology;  Laterality: Bilateral;   CYSTOSCOPY WITH BIOPSY N/A 12/30/2020   Procedure: CYSTOSCOPY WITH BIOPSY AND FULGURATION;  Surgeon: Jeffie Pollock,  Jenny Reichmann, MD;  Location: [facility];  Service: Urology;  Laterality: N/A;   SEPTOPLASTY     THYROIDECTOMY  2007   TRANSURETHRAL RESECTION OF BLADDER TUMOR WITH MITOMYCIN-C N/A 02/12/2017   Procedure: TRANSURETHRAL RESECTION OF BLADDER TUMOR;  Surgeon: Irine Seal, MD;  Location: [facility];  Service: Urology;  Laterality: N/A;    Allergies  Allergen Reactions   Azithromycin Rash    zpack   Doxycycline Rash    Current Outpatient Medications  Medication Sig Dispense Refill   acetaminophen (TYLENOL) 500 MG tablet Take 500-1,000 mg by mouth at bedtime. Take 500 mg daily at bedtime and may take an additional 500mg  tablet if needed for pain     allopurinol (ZYLOPRIM) 100 MG tablet Take 150 mg by mouth daily.     clopidogrel (PLAVIX) 75 MG tablet Take 75 mg by mouth at bedtime.      COMBIVENT RESPIMAT 20-100 MCG/ACT AERS respimat Inhale 1 puff into the  lungs in the morning and at bedtime.     diltiazem (CARDIZEM CD) 120 MG 24 hr capsule Take 1 capsule (120 mg total) by mouth daily. 90 capsule 3   Fluticasone-Salmeterol (ADVAIR) 250-50 MCG/DOSE AEPB Inhale 1 puff into the lungs every 12 (twelve) hours.     HUMALOG KWIKPEN 100 UNIT/ML KwikPen Inject 3 Units into the skin in the morning and at bedtime. Pt uses humalog in am , suppertime  Pt does 3 untis each time     Lancets (ONETOUCH DELICA PLUS [id number]) MISC      LEVEMIR FLEXTOUCH 100 UNIT/ML Pen Inject 6 Units into the skin at bedtime.      levothyroxine (SYNTHROID, LEVOTHROID) 125 MCG tablet Take 125 mcg by mouth daily before breakfast. 62.5 mcg on Sundays, 125 mcg all other days     LORazepam (ATIVAN) 0.5 MG tablet Take 0.25-0.5 mg by mouth as needed.     mirtazapine (REMERON SOL-TAB) 15 MG disintegrating tablet Take 15 mg by mouth at bedtime.     ONETOUCH ULTRA test strip      rosuvastatin (CRESTOR) 10 MG tablet Take 10 mg by mouth at bedtime.     tamsulosin (FLOMAX) 0.4 MG CAPS capsule Take 0.4 mg by mouth as needed.     triamcinolone cream (KENALOG) 0.1 % daily.     valsartan-hydrochlorothiazide (DIOVAN-HCT) 80-12.5 MG tablet Take 1 tablet by mouth every morning.     Vitamin D, Ergocalciferol, (DRISDOL) 50000 UNITS CAPS Take 50,000 Units by mouth every Monday.      Current Facility-Administered Medications  Medication Dose Route Frequency Provider Last Rate Last Admin   0.9 %  sodium chloride infusion  500 mL Intravenous Continuous Irene Shipper, MD        Family History  Problem Relation Age of Onset   Stomach cancer Mother    Heart failure Mother    Breast cancer Mother    Pneumonia Father    Diabetes Brother    Heart disease Brother        Heart Disease before age 39,  AAA and  Carotid   Heart attack Brother    Diabetes Paternal Uncle     Social History   Socioeconomic History   Marital status: Married    Spouse name: Not on file   Number of children: Not on file    Years of education: Not on file   Highest education level: Not on file  Occupational History   Occupation: Retired    Fish farm manager: RETIRED  Tobacco Use  Smoking status: Former    Packs/day: 1.50    Years: 50.00    Pack years: 75.00    Types: Cigarettes    Quit date: 05/28/2005    Years since quitting: 16.1   Smokeless tobacco: Never  Vaping Use   Vaping Use: Never used  Substance and Sexual Activity   Alcohol use: No    Alcohol/week: 0.0 standard drinks   Drug use: No   Sexual activity: Not Currently  Other Topics Concern   Not on file  Social History Narrative   Not on file   Social Determinants of Health   Financial Resource Strain: Not on file  Food Insecurity: Not on file  Transportation Needs: Not on file  Physical Activity: Not on file  Stress: Not on file  Social Connections: Not on file  Intimate Partner Violence: Not on file     REVIEW OF SYSTEMS:   [X]  denotes positive finding, [ ]  denotes negative finding Cardiac  Comments:  Chest pain or chest pressure:    Shortness of breath upon exertion:    Short of breath when lying flat:    Irregular heart rhythm:        Vascular    Pain in calf, thigh, or hip brought on by ambulation:    Pain in feet at night that wakes you up from your sleep:     Blood clot in your veins:    Leg swelling:         Pulmonary    Oxygen at home:    Wheezing:         Neurologic    Sudden weakness in arms or legs:     Sudden numbness in arms or legs:     Sudden onset of difficulty speaking or understanding others    Temporary loss of vision in one eye:     Problems with dizziness:         Gastrointestinal    Blood in stool:     Vomited blood:         Genitourinary    Burning when urinating:     Blood in urine:        Psychiatric    Major depression:         Hematologic    Bleeding problems:    Problems with blood clotting too easily:        Skin    Rashes or ulcers:        Constitutional    Fever or  chills:      PHYSICAL EXAMINATION:  Today's Vitals   07/06/21 1120 07/06/21 1121  BP: 133/83 127/67  Pulse: (!) 53   Resp: 20   Temp: 97.9 F (36.6 C)   TempSrc: Temporal   SpO2: 97%   Weight: 175 lb 14.4 oz (79.8 kg)   Height: 6' (1.829 m)    Body mass index is 23.86 kg/m.   General:  WDWN in NAD; vital signs documented above Gait: Not observed HENT: WNL, normocephalic Pulmonary: normal non-labored breathing  Cardiac: regular HR;  without carotid bruits Abdomen: soft, NT, aortic pulse is not palpable Skin: without rashes Vascular Exam/Pulses:  Right Left  Radial 2+ (normal) 2+ (normal)  Femoral 2+ (normal) 2+ (normal)  Popliteal Unable to palpate Unable to palpate  DP 2+ (normal) Brisk biphasic  PT Brisk biphasic Brisk biphasic  Peroneal Brisk biphasic Brisk biphasic   Extremities: without ischemic changes, without Gangrene , without cellulitis; without open wounds;  Musculoskeletal: no muscle wasting  or atrophy  Neurologic: A&O X 3;  speech is fluent/normal; moving all extremities equally  Psychiatric:  The pt has Normal affect.   Non-Invasive Vascular Imaging:   ABI's/TBI's on 07/06/2021: Right:  1.02/0.54 - great toe pressure:  70 Left:  1.12/0.75 - great toe pressure:  97   Non-Invasive Vascular Imaging:   Carotid Duplex on 07/06/2021: Right:  1-39% ICA stenosis Left:  1-39% ICA stenosis  Previous ABI's/TBI's on 12/11/2019: Right:  0.84/0.60 - great toe pressure:  89 Left:  0.99/0.66 - great toe pressure:  98  Previous Carotid duplex on 06/12/2018: Right: 1-39% ICA stenosis Left:   1-39% ICA stenosis    ASSESSMENT/PLAN:: 87 y.o. male here for follow up for hx of open repair of juxtarenal AAA by Dr. Donnetta Hutching in 2015 and carotid artery disease.    PAD -pt has ABI of 1 bilaterally; the right DP is palpable and other signals are biphasic bilaterally.   -pt does not have rest pain, claudication, non healing wounds. -continue graduated walking program as  tolerated.  -discussed with pt and his wife to protect his feet and inspect them daily  Carotid stenosis -duplex today reveals 1-39% bilateral ICA stenosis.  He has not had any new stroke sx.    -continue statin/Plavix.  -pt and his wife prefer to follow up as needed given his carotid duplex is 1-39% bilateral ICA stenosis.  Also, he does not have claudication or rest pain or non healing wounds.  He states he is DNR at home.  Agreed with pt and his wife and they will call if they have any issues.     Leontine Locket, [facility] [phone]  Clinic MD:   Oneida Alar

## 2021-07-06 ENCOUNTER — Ambulatory Visit: Payer: Medicare Other | Admitting: Physician Assistant

## 2021-07-06 ENCOUNTER — Ambulatory Visit (INDEPENDENT_AMBULATORY_CARE_PROVIDER_SITE_OTHER)
Admission: RE | Admit: 2021-07-06 | Discharge: 2021-07-06 | Disposition: A | Discharge status: Home / Self Care | Payer: Medicare Other | Source: Ambulatory Visit | Attending: Vascular Surgery | Admitting: Vascular Surgery

## 2021-07-06 ENCOUNTER — Ambulatory Visit (HOSPITAL_COMMUNITY)
Admission: RE | Admit: 2021-07-06 | Discharge: 2021-07-06 | Disposition: A | Discharge status: Home / Self Care | Payer: Medicare Other | Source: Ambulatory Visit | Attending: Vascular Surgery | Admitting: Vascular Surgery

## 2021-07-06 ENCOUNTER — Other Ambulatory Visit: Payer: Self-pay

## 2021-07-06 VITALS — BP 127/67 | HR 53 | Temp 97.9°F | Resp 20 | Ht 72.0 in | Wt 175.9 lb

## 2021-07-06 DIAGNOSIS — I739 Peripheral vascular disease, unspecified: Secondary | ICD-10-CM

## 2021-07-06 DIAGNOSIS — I6523 Occlusion and stenosis of bilateral carotid arteries: Secondary | ICD-10-CM | POA: Diagnosis not present

## 2021-07-17 ENCOUNTER — Ambulatory Visit: Payer: Medicare Other | Admitting: Podiatry

## 2021-07-17 ENCOUNTER — Other Ambulatory Visit: Payer: Self-pay

## 2021-07-17 ENCOUNTER — Encounter: Payer: Self-pay | Admitting: Podiatry

## 2021-07-17 DIAGNOSIS — D689 Coagulation defect, unspecified: Secondary | ICD-10-CM

## 2021-07-17 DIAGNOSIS — M79676 Pain in unspecified toe(s): Secondary | ICD-10-CM | POA: Diagnosis not present

## 2021-07-17 DIAGNOSIS — B351 Tinea unguium: Secondary | ICD-10-CM | POA: Diagnosis not present

## 2021-07-17 DIAGNOSIS — E1142 Type 2 diabetes mellitus with diabetic polyneuropathy: Secondary | ICD-10-CM

## 2021-07-17 NOTE — Progress Notes (Signed)
This patient returns to my office for at risk foot care.  This patient requires this care by a professional since this patient will be at risk due to having chronic kidney disease, thrombocytopenia and type 2 diabetes.   Patient is taking plavix.    This patient is unable to cut nails himself since the patient cannot reach his nails.These nails are painful walking and wearing shoes.   This patient presents for at risk foot care today.   General Appearance  Alert, conversant and in no acute stress.  Vascular  Dorsalis pedis and posterior tibial  pulses are weakly  palpable  bilaterally.  Capillary return is within normal limits  bilaterally. Temperature is within normal limits  Bilaterally.  Venous stasis  B/L.  Neurologic  Senn-Weinstein monofilament wire test absent   bilaterally. Muscle power within normal limits bilaterally.  Nails Thick disfigured discolored nails with subungual debris  from hallux to fifth toes bilaterally. No evidence of bacterial infection or drainage bilaterally.  Orthopedic  No limitations of motion  feet .  No crepitus or effusions noted.  HAV  B/L.  Hammer toes  B/L.  Exostosis IPJ left hallux.  Skin  normotropic skin with no porokeratosis noted bilaterally.  No signs of infections or ulcers noted.   Porokeratosis sub 5th  Left foot.  Onychomycosis  Pain in right toes  Pain in left toes  Consent was obtained for treatment procedures.   Mechanical debridement of nails 1-5  bilaterally performed with a nail nipper.  Filed with dremel without incident.    Return office visit   10 weeks                  Told patient to return for periodic foot care and evaluation due to potential at risk complications.   Gardiner Barefoot DPM

## 2021-09-25 ENCOUNTER — Ambulatory Visit: Payer: Medicare Other | Admitting: Podiatry

## 2021-09-27 ENCOUNTER — Ambulatory Visit: Payer: Medicare Other | Admitting: Podiatry

## 2021-10-11 ENCOUNTER — Encounter: Payer: Self-pay | Admitting: Podiatry

## 2021-10-11 ENCOUNTER — Ambulatory Visit: Payer: Medicare Other | Admitting: Podiatry

## 2021-10-11 ENCOUNTER — Other Ambulatory Visit: Payer: Self-pay

## 2021-10-11 DIAGNOSIS — D689 Coagulation defect, unspecified: Secondary | ICD-10-CM

## 2021-10-11 DIAGNOSIS — Q828 Other specified congenital malformations of skin: Secondary | ICD-10-CM

## 2021-10-11 DIAGNOSIS — B351 Tinea unguium: Secondary | ICD-10-CM | POA: Diagnosis not present

## 2021-10-11 DIAGNOSIS — E1142 Type 2 diabetes mellitus with diabetic polyneuropathy: Secondary | ICD-10-CM

## 2021-10-11 DIAGNOSIS — M79676 Pain in unspecified toe(s): Secondary | ICD-10-CM | POA: Diagnosis not present

## 2021-10-11 NOTE — Progress Notes (Signed)
This patient returns to my office for at risk foot care.  This patient requires this care by a professional since this patient will be at risk due to having chronic kidney disease, thrombocytopenia and type 2 diabetes.   Patient is taking plavix.    This patient is unable to cut nails himself since the patient cannot reach his nails.These nails are painful walking and wearing shoes.   This patient presents for at risk foot care today.   General Appearance  Alert, conversant and in no acute stress.  Vascular  Dorsalis pedis and posterior tibial  pulses are weakly  palpable  bilaterally.  Capillary return is within normal limits  bilaterally. Temperature is within normal limits  Bilaterally.  Venous stasis  B/L.  Neurologic  Senn-Weinstein monofilament wire test absent   bilaterally. Muscle power within normal limits bilaterally.  Nails Thick disfigured discolored nails with subungual debris  from hallux to fifth toes bilaterally. No evidence of bacterial infection or drainage bilaterally.  Orthopedic  No limitations of motion  feet .  No crepitus or effusions noted.  HAV  B/L.  Hammer toes  B/L.  Exostosis IPJ left hallux.  Skin  normotropic skin with no porokeratosis noted bilaterally.  No signs of infections or ulcers noted.   Porokeratosis sub 5th  Left foot.  Onychomycosis  Pain in right toes  Pain in left toes  Consent was obtained for treatment procedures.   Mechanical debridement of nails 1-5  bilaterally performed with a nail nipper.  Filed with dremel without incident.  Patient qualifies for diabetic shoesdue to DPN and HAV  B/L.   Return office visit   10 weeks                  Told patient to return for periodic foot care and evaluation due to potential at risk complications.   Gardiner Barefoot DPM

## 2021-10-13 ENCOUNTER — Ambulatory Visit (INDEPENDENT_AMBULATORY_CARE_PROVIDER_SITE_OTHER): Payer: Medicare Other | Admitting: Podiatrist

## 2021-10-13 ENCOUNTER — Other Ambulatory Visit: Payer: Self-pay

## 2021-10-13 DIAGNOSIS — M216X2 Other acquired deformities of left foot: Secondary | ICD-10-CM

## 2021-10-13 DIAGNOSIS — E1142 Type 2 diabetes mellitus with diabetic polyneuropathy: Secondary | ICD-10-CM

## 2021-10-13 DIAGNOSIS — M216X1 Other acquired deformities of right foot: Secondary | ICD-10-CM

## 2021-10-13 NOTE — Progress Notes (Signed)
Patient presents today to be measured for Diabetic shoes and 3 pr diabetic inserts.  Patient measured to be 12 W b/l.  His current pair of diabetic shoes are confirmed to also be a 12 W   Shoes chosen:  [place] (orthofeet)  (second choice [id number] Ariya Moc Toe by [employer])  Foam impression taken-  asked to offload marked area at 5th met base b/l  We will call when shoes are back and ready for pick up.

## 2021-11-03 ENCOUNTER — Ambulatory Visit: Payer: Medicare Other | Admitting: Internal Medicine

## 2021-11-09 ENCOUNTER — Encounter (HOSPITAL_COMMUNITY): Payer: Self-pay | Admitting: Emergency Medicine

## 2021-11-09 ENCOUNTER — Emergency Department (HOSPITAL_COMMUNITY): Payer: Medicare Other

## 2021-11-09 ENCOUNTER — Other Ambulatory Visit: Payer: Self-pay

## 2021-11-09 ENCOUNTER — Inpatient Hospital Stay (HOSPITAL_COMMUNITY)
Admission: EM | Admit: 2021-11-09 | Discharge: 2021-11-14 | DRG: 193 | Disposition: A | Discharge status: Home Health | Payer: Medicare Other | Attending: Internal Medicine | Admitting: Internal Medicine

## 2021-11-09 DIAGNOSIS — Z7902 Long term (current) use of antithrombotics/antiplatelets: Secondary | ICD-10-CM | POA: Diagnosis not present

## 2021-11-09 DIAGNOSIS — I130 Hypertensive heart and chronic kidney disease with heart failure and stage 1 through stage 4 chronic kidney disease, or unspecified chronic kidney disease: Secondary | ICD-10-CM | POA: Diagnosis present

## 2021-11-09 DIAGNOSIS — Z803 Family history of malignant neoplasm of breast: Secondary | ICD-10-CM

## 2021-11-09 DIAGNOSIS — J189 Pneumonia, unspecified organism: Secondary | ICD-10-CM

## 2021-11-09 DIAGNOSIS — D539 Nutritional anemia, unspecified: Secondary | ICD-10-CM | POA: Diagnosis present

## 2021-11-09 DIAGNOSIS — E1165 Type 2 diabetes mellitus with hyperglycemia: Secondary | ICD-10-CM | POA: Diagnosis not present

## 2021-11-09 DIAGNOSIS — N1832 Chronic kidney disease, stage 3b: Secondary | ICD-10-CM | POA: Diagnosis present

## 2021-11-09 DIAGNOSIS — Z794 Long term (current) use of insulin: Secondary | ICD-10-CM | POA: Diagnosis not present

## 2021-11-09 DIAGNOSIS — U099 Post covid-19 condition, unspecified: Secondary | ICD-10-CM | POA: Diagnosis present

## 2021-11-09 DIAGNOSIS — E1122 Type 2 diabetes mellitus with diabetic chronic kidney disease: Secondary | ICD-10-CM | POA: Diagnosis present

## 2021-11-09 DIAGNOSIS — R7401 Elevation of levels of liver transaminase levels: Secondary | ICD-10-CM | POA: Diagnosis present

## 2021-11-09 DIAGNOSIS — N179 Acute kidney failure, unspecified: Secondary | ICD-10-CM | POA: Diagnosis present

## 2021-11-09 DIAGNOSIS — J9601 Acute respiratory failure with hypoxia: Secondary | ICD-10-CM | POA: Diagnosis present

## 2021-11-09 DIAGNOSIS — Z7951 Long term (current) use of inhaled steroids: Secondary | ICD-10-CM

## 2021-11-09 DIAGNOSIS — F419 Anxiety disorder, unspecified: Secondary | ICD-10-CM | POA: Diagnosis present

## 2021-11-09 DIAGNOSIS — I5032 Chronic diastolic (congestive) heart failure: Secondary | ICD-10-CM | POA: Diagnosis present

## 2021-11-09 DIAGNOSIS — I10 Essential (primary) hypertension: Secondary | ICD-10-CM | POA: Diagnosis not present

## 2021-11-09 DIAGNOSIS — Z800 Family history of malignant neoplasm of digestive organs: Secondary | ICD-10-CM | POA: Diagnosis not present

## 2021-11-09 DIAGNOSIS — Z8616 Personal history of COVID-19: Secondary | ICD-10-CM | POA: Diagnosis not present

## 2021-11-09 DIAGNOSIS — Z8673 Personal history of transient ischemic attack (TIA), and cerebral infarction without residual deficits: Secondary | ICD-10-CM

## 2021-11-09 DIAGNOSIS — I493 Ventricular premature depolarization: Secondary | ICD-10-CM | POA: Diagnosis present

## 2021-11-09 DIAGNOSIS — D72829 Elevated white blood cell count, unspecified: Secondary | ICD-10-CM | POA: Diagnosis present

## 2021-11-09 DIAGNOSIS — R54 Age-related physical debility: Secondary | ICD-10-CM | POA: Diagnosis present

## 2021-11-09 DIAGNOSIS — Z87891 Personal history of nicotine dependence: Secondary | ICD-10-CM

## 2021-11-09 DIAGNOSIS — Z8249 Family history of ischemic heart disease and other diseases of the circulatory system: Secondary | ICD-10-CM

## 2021-11-09 DIAGNOSIS — E039 Hypothyroidism, unspecified: Secondary | ICD-10-CM | POA: Diagnosis present

## 2021-11-09 DIAGNOSIS — E785 Hyperlipidemia, unspecified: Secondary | ICD-10-CM | POA: Diagnosis present

## 2021-11-09 DIAGNOSIS — Z85038 Personal history of other malignant neoplasm of large intestine: Secondary | ICD-10-CM | POA: Diagnosis not present

## 2021-11-09 DIAGNOSIS — N183 Chronic kidney disease, stage 3 unspecified: Secondary | ICD-10-CM | POA: Diagnosis not present

## 2021-11-09 DIAGNOSIS — E860 Dehydration: Secondary | ICD-10-CM | POA: Diagnosis present

## 2021-11-09 DIAGNOSIS — J129 Viral pneumonia, unspecified: Principal | ICD-10-CM | POA: Diagnosis present

## 2021-11-09 DIAGNOSIS — J9621 Acute and chronic respiratory failure with hypoxia: Secondary | ICD-10-CM | POA: Diagnosis not present

## 2021-11-09 DIAGNOSIS — Y92239 Unspecified place in hospital as the place of occurrence of the external cause: Secondary | ICD-10-CM | POA: Diagnosis not present

## 2021-11-09 DIAGNOSIS — Z833 Family history of diabetes mellitus: Secondary | ICD-10-CM

## 2021-11-09 DIAGNOSIS — R0602 Shortness of breath: Secondary | ICD-10-CM

## 2021-11-09 DIAGNOSIS — J441 Chronic obstructive pulmonary disease with (acute) exacerbation: Secondary | ICD-10-CM | POA: Diagnosis present

## 2021-11-09 DIAGNOSIS — Z79890 Hormone replacement therapy (postmenopausal): Secondary | ICD-10-CM | POA: Diagnosis not present

## 2021-11-09 DIAGNOSIS — I714 Abdominal aortic aneurysm, without rupture, unspecified: Secondary | ICD-10-CM | POA: Diagnosis present

## 2021-11-09 DIAGNOSIS — T380X5A Adverse effect of glucocorticoids and synthetic analogues, initial encounter: Secondary | ICD-10-CM | POA: Diagnosis not present

## 2021-11-09 DIAGNOSIS — Z79899 Other long term (current) drug therapy: Secondary | ICD-10-CM

## 2021-11-09 LAB — CBG MONITORING, ED
Glucose-Capillary: 203 mg/dL — ABNORMAL HIGH (ref 70–99)
Glucose-Capillary: 387 mg/dL — ABNORMAL HIGH (ref 70–99)

## 2021-11-09 LAB — CBC WITH DIFFERENTIAL/PLATELET
Abs Immature Granulocytes: 0.06 10*3/uL (ref 0.00–0.07)
Basophils Absolute: 0 10*3/uL (ref 0.0–0.1)
Basophils Relative: 0 %
Eosinophils Absolute: 0 10*3/uL (ref 0.0–0.5)
Eosinophils Relative: 0 %
HCT: 35.2 % — ABNORMAL LOW (ref 39.0–52.0)
Hemoglobin: 11.9 g/dL — ABNORMAL LOW (ref 13.0–17.0)
Immature Granulocytes: 1 %
Lymphocytes Relative: 8 %
Lymphs Abs: 1 10*3/uL (ref 0.7–4.0)
MCH: 33.9 pg (ref 26.0–34.0)
MCHC: 33.8 g/dL (ref 30.0–36.0)
MCV: 100.3 fL — ABNORMAL HIGH (ref 80.0–100.0)
Monocytes Absolute: 0.9 10*3/uL (ref 0.1–1.0)
Monocytes Relative: 8 %
Neutro Abs: 9.7 10*3/uL — ABNORMAL HIGH (ref 1.7–7.7)
Neutrophils Relative %: 83 %
Platelets: 201 10*3/uL (ref 150–400)
RBC: 3.51 MIL/uL — ABNORMAL LOW (ref 4.22–5.81)
RDW: 13.2 % (ref 11.5–15.5)
WBC: 11.6 10*3/uL — ABNORMAL HIGH (ref 4.0–10.5)
nRBC: 0 % (ref 0.0–0.2)

## 2021-11-09 LAB — COMPREHENSIVE METABOLIC PANEL
ALT: 51 U/L — ABNORMAL HIGH (ref 0–44)
AST: 43 U/L — ABNORMAL HIGH (ref 15–41)
Albumin: 3.5 g/dL (ref 3.5–5.0)
Alkaline Phosphatase: 67 U/L (ref 38–126)
Anion gap: 14 (ref 5–15)
BUN: 43 mg/dL — ABNORMAL HIGH (ref 8–23)
CO2: 16 mmol/L — ABNORMAL LOW (ref 22–32)
Calcium: 9.1 mg/dL (ref 8.9–10.3)
Chloride: 103 mmol/L (ref 98–111)
Creatinine, Ser: 1.85 mg/dL — ABNORMAL HIGH (ref 0.61–1.24)
GFR, Estimated: 35 mL/min — ABNORMAL LOW (ref >60)
Glucose, Bld: 184 mg/dL — ABNORMAL HIGH (ref 70–99)
Potassium: 3.7 mmol/L (ref 3.5–5.1)
Sodium: 133 mmol/L — ABNORMAL LOW (ref 135–145)
Total Bilirubin: 0.7 mg/dL (ref 0.3–1.2)
Total Protein: 6.3 g/dL — ABNORMAL LOW (ref 6.5–8.1)

## 2021-11-09 LAB — RESP PANEL BY RT-PCR (FLU A&B, COVID) ARPGX2
Influenza A by PCR: NEGATIVE
Influenza B by PCR: NEGATIVE
SARS Coronavirus 2 by RT PCR: POSITIVE — AB

## 2021-11-09 LAB — TROPONIN I (HIGH SENSITIVITY)
Troponin I (High Sensitivity): 16 ng/L (ref <18)
Troponin I (High Sensitivity): 24 ng/L — ABNORMAL HIGH (ref <18)

## 2021-11-09 LAB — BRAIN NATRIURETIC PEPTIDE: B Natriuretic Peptide: 461.4 pg/mL — ABNORMAL HIGH (ref 0.0–100.0)

## 2021-11-09 LAB — PROCALCITONIN: Procalcitonin: <0.1 ng/mL

## 2021-11-09 MED ORDER — ARFORMOTEROL TARTRATE 15 MCG/2ML IN NEBU
15.0000 ug | INHALATION_SOLUTION | Freq: Two times a day (BID) | RESPIRATORY_TRACT | Status: DC
  Administered 2021-11-09 – 2021-11-14 (×10): 15 ug via RESPIRATORY_TRACT
  Filled 2021-11-09 (×10): qty 2

## 2021-11-09 MED ORDER — LEVOTHYROXINE SODIUM 125 MCG PO TABS: 62.5000 ug | ORAL_TABLET | ORAL | Status: DC

## 2021-11-09 MED ORDER — INSULIN ASPART 100 UNIT/ML IJ SOLN: 0.0000 [IU] | Freq: Three times a day (TID) | INTRAMUSCULAR | Status: DC

## 2021-11-09 MED ORDER — SODIUM CHLORIDE 0.9 % IV SOLN
500.0000 mg | Freq: Once | INTRAVENOUS | Status: DC
  Administered 2021-11-09: 15:00:00 500 mg via INTRAVENOUS
  Filled 2021-11-09: qty 500

## 2021-11-09 MED ORDER — IPRATROPIUM-ALBUTEROL 0.5-2.5 (3) MG/3ML IN SOLN
3.0000 mL | Freq: Once | RESPIRATORY_TRACT | Status: DC
  Filled 2021-11-09: qty 3

## 2021-11-09 MED ORDER — INSULIN DETEMIR 100 UNIT/ML FLEXPEN: 7.0000 [IU] | PEN_INJECTOR | Freq: Every day | SUBCUTANEOUS | Status: DC

## 2021-11-09 MED ORDER — BUDESONIDE 0.5 MG/2ML IN SUSP
0.5000 mg | Freq: Two times a day (BID) | RESPIRATORY_TRACT | Status: DC
  Administered 2021-11-09: 21:00:00 0.5 mg via RESPIRATORY_TRACT
  Filled 2021-11-09: qty 2

## 2021-11-09 MED ORDER — ALLOPURINOL 300 MG PO TABS
150.0000 mg | ORAL_TABLET | Freq: Every day | ORAL | Status: DC
  Administered 2021-11-10 – 2021-11-14 (×5): 150 mg via ORAL
  Filled 2021-11-09 (×3): qty 1
  Filled 2021-11-09: qty 2
  Filled 2021-11-09: qty 1

## 2021-11-09 MED ORDER — ACETAMINOPHEN 325 MG PO TABS: 650.0000 mg | ORAL_TABLET | Freq: Four times a day (QID) | ORAL | Status: DC | PRN

## 2021-11-09 MED ORDER — SODIUM CHLORIDE 0.9 % IV SOLN
2.0000 g | Freq: Two times a day (BID) | INTRAVENOUS | Status: DC
  Administered 2021-11-09 – 2021-11-10 (×3): 2 g via INTRAVENOUS
  Filled 2021-11-09 (×4): qty 2

## 2021-11-09 MED ORDER — INSULIN GLARGINE-YFGN 100 UNIT/ML ~~LOC~~ SOLN
7.0000 [IU] | Freq: Every day | SUBCUTANEOUS | Status: DC
Start: 2021-11-09 — End: 2021-11-10
  Administered 2021-11-09: 21:00:00 7 [IU] via SUBCUTANEOUS
  Filled 2021-11-09 (×2): qty 0.07

## 2021-11-09 MED ORDER — FUROSEMIDE 10 MG/ML IJ SOLN
40.0000 mg | Freq: Once | INTRAMUSCULAR | Status: AC
  Administered 2021-11-09: 20:00:00 40 mg via INTRAVENOUS
  Filled 2021-11-09: qty 4

## 2021-11-09 MED ORDER — ACETAMINOPHEN 650 MG RE SUPP: 650.0000 mg | Freq: Four times a day (QID) | RECTAL | Status: DC | PRN

## 2021-11-09 MED ORDER — ROSUVASTATIN CALCIUM 5 MG PO TABS
10.0000 mg | ORAL_TABLET | Freq: Every day | ORAL | Status: DC
  Administered 2021-11-09 – 2021-11-13 (×5): 10 mg via ORAL
  Filled 2021-11-09 (×5): qty 2

## 2021-11-09 MED ORDER — INSULIN ASPART 100 UNIT/ML IJ SOLN
0.0000 [IU] | Freq: Three times a day (TID) | INTRAMUSCULAR | Status: DC
  Administered 2021-11-09: 20:00:00 15 [IU] via SUBCUTANEOUS
  Administered 2021-11-10 (×2): 3 [IU] via SUBCUTANEOUS
  Administered 2021-11-10 – 2021-11-11 (×2): 8 [IU] via SUBCUTANEOUS
  Administered 2021-11-11: 11 [IU] via SUBCUTANEOUS
  Administered 2021-11-11: 3 [IU] via SUBCUTANEOUS
  Administered 2021-11-12: 5 [IU] via SUBCUTANEOUS
  Administered 2021-11-12: 3 [IU] via SUBCUTANEOUS
  Administered 2021-11-12: 5 [IU] via SUBCUTANEOUS
  Administered 2021-11-13: 8 [IU] via SUBCUTANEOUS
  Administered 2021-11-13 – 2021-11-14 (×2): 3 [IU] via SUBCUTANEOUS

## 2021-11-09 MED ORDER — METHYLPREDNISOLONE SODIUM SUCC 125 MG IJ SOLR
80.0000 mg | INTRAMUSCULAR | Status: DC
  Administered 2021-11-09: 20:00:00 80 mg via INTRAVENOUS
  Filled 2021-11-09: qty 2

## 2021-11-09 MED ORDER — IPRATROPIUM-ALBUTEROL 0.5-2.5 (3) MG/3ML IN SOLN
3.0000 mL | Freq: Four times a day (QID) | RESPIRATORY_TRACT | Status: DC
  Administered 2021-11-09: 20:00:00 3 mL via RESPIRATORY_TRACT
  Filled 2021-11-09 (×2): qty 3

## 2021-11-09 MED ORDER — HEPARIN SODIUM (PORCINE) 5000 UNIT/ML IJ SOLN
5000.0000 [IU] | Freq: Three times a day (TID) | INTRAMUSCULAR | Status: DC
  Administered 2021-11-09 – 2021-11-14 (×14): 5000 [IU] via SUBCUTANEOUS
  Filled 2021-11-09 (×14): qty 1

## 2021-11-09 MED ORDER — CLOPIDOGREL BISULFATE 75 MG PO TABS
75.0000 mg | ORAL_TABLET | Freq: Every day | ORAL | Status: DC
  Administered 2021-11-09 – 2021-11-13 (×5): 75 mg via ORAL
  Filled 2021-11-09 (×5): qty 1

## 2021-11-09 MED ORDER — LORAZEPAM 0.5 MG PO TABS
0.2500 mg | ORAL_TABLET | Freq: Every day | ORAL | Status: DC | PRN
Start: 2021-11-09 — End: 2021-11-14
  Administered 2021-11-12 – 2021-11-14 (×2): 0.5 mg via ORAL
  Filled 2021-11-09 (×2): qty 1

## 2021-11-09 MED ORDER — LACTATED RINGERS IV SOLN: INTRAVENOUS | Status: AC

## 2021-11-09 MED ORDER — DILTIAZEM HCL ER COATED BEADS 120 MG PO CP24
120.0000 mg | ORAL_CAPSULE | Freq: Every day | ORAL | Status: DC
  Administered 2021-11-10 – 2021-11-14 (×5): 120 mg via ORAL
  Filled 2021-11-09 (×5): qty 1

## 2021-11-09 MED ORDER — MIRTAZAPINE 15 MG PO TBDP
15.0000 mg | ORAL_TABLET | Freq: Every day | ORAL | Status: DC
  Administered 2021-11-09 – 2021-11-13 (×5): 15 mg via ORAL
  Filled 2021-11-09 (×6): qty 1

## 2021-11-09 MED ORDER — LEVOTHYROXINE SODIUM 25 MCG PO TABS
125.0000 ug | ORAL_TABLET | ORAL | Status: DC
  Administered 2021-11-10 – 2021-11-14 (×4): 125 ug via ORAL
  Filled 2021-11-09 (×5): qty 1

## 2021-11-09 MED ORDER — SODIUM CHLORIDE 0.9 % IV SOLN
2.0000 g | Freq: Once | INTRAVENOUS | Status: AC
  Administered 2021-11-09: 15:00:00 2 g via INTRAVENOUS
  Filled 2021-11-09: qty 2

## 2021-11-09 MED ORDER — LEVOTHYROXINE SODIUM 50 MCG PO TABS
62.5000 ug | ORAL_TABLET | ORAL | Status: DC
  Administered 2021-11-12: 62.5 ug via ORAL
  Filled 2021-11-09: qty 1

## 2021-11-09 MED ORDER — LORATADINE 10 MG PO TABS
10.0000 mg | ORAL_TABLET | Freq: Every day | ORAL | Status: DC
  Administered 2021-11-09 – 2021-11-14 (×6): 10 mg via ORAL
  Filled 2021-11-09 (×6): qty 1

## 2021-11-09 MED ORDER — GUAIFENESIN ER 600 MG PO TB12
600.0000 mg | ORAL_TABLET | Freq: Two times a day (BID) | ORAL | Status: DC
  Administered 2021-11-09 – 2021-11-14 (×9): 600 mg via ORAL
  Filled 2021-11-09 (×11): qty 1

## 2021-11-09 NOTE — ED Provider Notes (Signed)
[facility] EMERGENCY DEPARTMENT Provider Note   CSN: [id number] Arrival date & time: 11/09/21  1151     History Chief Complaint  Patient presents with   Shortness of Breath    David [name] is a 87 y.o. male.  This is a 87 y.o. male with significant medical history as below, including COPD, AAA, DM, HTN, recent Covid 19 infection (around 2 wks ago) who presents to the ED with complaint of dyspnea, cough. Ongoing dyspnea and cough since covid 19 infection around 2.5 wks ago. His symptoms did mildly improve then became acutely worse in the last 2 days. He does not use home oxygen but upon EMS arrival to residence his pulse ox was 80-85% per patient, he checks pulse ox regularly and is typically 98-99% on room air. No CPAP or BIPAP use. He has chills the last 2 days, possible subjective fever, poor appetite/reduced oral intake. He has been coughing but feels unable to expectorate sputum. Dyspnea is worsened with exertion and he must sit down and rest after short bouts of ambulation to catch his breath. At baseline he has no significant dyspnea.   The history is provided by the patient and a relative.  Shortness of Breath Associated symptoms: cough   Associated symptoms: no abdominal pain, no chest pain, no fever, no headaches, no rash and no vomiting       Past Medical History:  Diagnosis Date   AAA (abdominal aortic aneurysm)    AAA (abdominal aortic aneurysm)    AAA (abdominal aortic aneurysm)    Atherosclerosis    pt denies this   BPH (benign prostatic hyperplasia)    CKD (chronic kidney disease), stage III [facility])    family reports caused by scans but that has resolved since CT scans have stopped   Colon cancer ([place])    COPD (chronic obstructive pulmonary disease) ([place])    Diabetes mellitus    ED (erectile dysfunction)    Gastroesophageal reflux disease    Heart murmur    Hyperlipidemia    Hypertension    Lung nodules    Neuromuscular disorder ([place])     Neuropathy feet   Osteopenia    Peripheral vascular disease ([place])    Pneumonia June 2013   Positional vertigo    intermittent    Shortness of breath    chronic per patient   Stroke [facility])    x2 in 2006   Stroke [facility])    Thyroid cancer ([place])    Thyroid disease    Type 2 diabetes mellitus ([place])    Vitamin D deficiency     Patient Active Problem List   Diagnosis Date Noted   COPD with acute exacerbation ([place]) 11/09/2021   PVC (premature ventricular contraction) 04/24/2021   PAC (premature atrial contraction) 04/24/2021   Thoracic aortic aneurysm without rupture 04/24/2021   Aortic atherosclerosis ([place]) 04/24/2021   History of stroke 04/24/2021   Hypertension associated with diabetes ([place]) 04/24/2021   Porokeratosis 12/09/2020   Hemoptysis 07/23/2017   Right upper lobe pneumonia 07/23/2017   AKI (acute kidney injury) ([place]) 06/25/2015   Chronic diastolic heart failure ([place]) 06/24/2015   CAP (community acquired pneumonia) 06/23/2015   CKD (chronic kidney disease) stage 3, GFR 30-59 ml/min (HCC) 06/23/2015   Hypothyroidism 06/23/2015   Leukocytosis 06/23/2015   Thrombocytopenia ([place]) 06/23/2015   Acute respiratory failure with hypoxia (HCC)    Type 2 diabetes mellitus with diabetic chronic kidney disease ([place])    Abdominal aortic aneurysm without  rupture 11/08/2014   HTN (hypertension) 06/14/2012   Dyslipidemia 06/14/2012   Cerebral artery occlusion with cerebral infarction ([place]) 01/04/2009   Chronic obstructive pulmonary disease ([place]) 01/04/2009    Past Surgical History:  Procedure Laterality Date   ABDOMINAL AORTIC ANEURYSM REPAIR N/A 11/08/2014   Procedure: ANEURYSM ABDOMINAL AORTIC REPAIR;  Surgeon: Rosetta Posner, MD;  Location: [place];  Service: Vascular;  Laterality: N/A;   ABDOMINAL AORTIC ANEURYSM REPAIR     COLON RESECTION  2007   COLON SURGERY  2006   Resection   CYSTOSCOPY W/ RETROGRADES Bilateral 12/30/2020   Procedure: CYSTOSCOPY WITH BILATERAL RETROGRADE;   Surgeon: Irine Seal, MD;  Location: [facility];  Service: Urology;  Laterality: Bilateral;   CYSTOSCOPY WITH BIOPSY N/A 12/30/2020   Procedure: CYSTOSCOPY WITH BIOPSY AND FULGURATION;  Surgeon: Irine Seal, MD;  Location: [facility];  Service: Urology;  Laterality: N/A;   SEPTOPLASTY     THYROIDECTOMY  2007   TRANSURETHRAL RESECTION OF BLADDER TUMOR WITH MITOMYCIN-C N/A 02/12/2017   Procedure: TRANSURETHRAL RESECTION OF BLADDER TUMOR;  Surgeon: Irine Seal, MD;  Location: [facility];  Service: Urology;  Laterality: N/A;       Family History  Problem Relation Age of Onset   Stomach cancer Mother    Heart failure Mother    Breast cancer Mother    Pneumonia Father    Diabetes Brother    Heart disease Brother        Heart Disease before age 57,  AAA and  Carotid   Heart attack Brother    Diabetes Paternal Uncle     Social History   Tobacco Use   Smoking status: Former    Packs/day: 1.50    Years: 50.00    Pack years: 75.00    Types: Cigarettes    Quit date: 05/28/2005    Years since quitting: 16.4   Smokeless tobacco: Never  Vaping Use   Vaping Use: Never used  Substance Use Topics   Alcohol use: No    Alcohol/week: 0.0 standard drinks   Drug use: No    Home Medications Prior to Admission medications   Medication Sig Start Date End Date Taking? Authorizing Provider  acetaminophen (TYLENOL) 500 MG tablet Take 500-1,000 mg by mouth at bedtime. Take 500 mg daily at bedtime and may take an additional 500mg  tablet if needed for pain    [provider]  allopurinol (ZYLOPRIM) 100 MG tablet Take 150 mg by mouth daily.    [provider]  BD PEN NEEDLE NANO 2ND GEN 32G X 4 MM MISC USE WITH INSULIN PENS TO GIVE 4 INJECTIONS DAILY 10/02/21   [provider]  clopidogrel (PLAVIX) 75 MG tablet Take 75 mg by mouth at bedtime.     [provider]  COMBIVENT RESPIMAT 20-100 MCG/ACT AERS respimat Inhale 1 puff into the lungs in the morning and at bedtime. 01/06/20    [provider]  diltiazem (CARDIZEM CD) 120 MG 24 hr capsule Take 1 capsule (120 mg total) by mouth daily. 05/24/21   Chandrasekhar, Mahesh A, MD  Fluticasone-Salmeterol (ADVAIR) 250-50 MCG/DOSE AEPB Inhale 1 puff into the lungs every 12 (twelve) hours.    [provider]  HUMALOG KWIKPEN 100 UNIT/ML KwikPen Inject 3 Units into the skin in the morning and at bedtime. Pt uses humalog in am , suppertime  Pt does 3 untis each time 02/29/20   [provider]  Lancets (ONETOUCH DELICA PLUS [id number]) [place]  01/06/20   [provider]  LEVEMIR FLEXTOUCH 100 UNIT/ML Pen Inject 6 Units into the skin at bedtime.  04/05/15   [provider]  levothyroxine (SYNTHROID, LEVOTHROID) 125 MCG tablet Take 125 mcg by mouth daily before breakfast. 62.5 mcg on Sundays, 125 mcg all other days    [provider]  LORazepam (ATIVAN) 0.5 MG tablet Take 0.25-0.5 mg by mouth as needed. 02/10/21   [provider]  mirtazapine (REMERON SOL-TAB) 15 MG disintegrating tablet Take 15 mg by mouth at bedtime. 12/28/16   [provider]  [facility] ULTRA test strip  08/22/19   [provider]  prednisoLONE acetate (PRED FORTE) 1 % ophthalmic suspension 1 drop daily. 08/09/21   [provider]  prednisoLONE sodium phosphate (INFLAMASE FORTE) 1 % ophthalmic solution 1 drop daily. 08/03/21   [provider]  rosuvastatin (CRESTOR) 10 MG tablet Take 10 mg by mouth at bedtime. 10/18/19   [provider]  tamsulosin (FLOMAX) 0.4 MG CAPS capsule Take 0.4 mg by mouth as needed.    [provider]  triamcinolone cream (KENALOG) 0.1 % daily. 01/06/21   [provider]  valsartan-hydrochlorothiazide (DIOVAN-HCT) 80-12.5 MG tablet Take 1 tablet by mouth every morning. 09/16/19   [provider]  Vitamin D, Ergocalciferol, (DRISDOL) 50000 UNITS CAPS Take 50,000 Units by mouth every Monday.     [provider]     Allergies    Azithromycin and Doxycycline  Review of Systems   Review of Systems  Constitutional:  Positive for appetite change, chills and fatigue. Negative for fever.  HENT:  Negative for facial swelling and trouble swallowing.   Eyes:  Negative for photophobia and visual disturbance.  Respiratory:  Positive for cough and shortness of breath.   Cardiovascular:  Negative for chest pain and palpitations.  Gastrointestinal:  Negative for abdominal pain, nausea and vomiting.  Endocrine: Negative for polydipsia and polyuria.  Genitourinary:  Negative for difficulty urinating and hematuria.  Musculoskeletal:  Negative for gait problem and joint swelling.  Skin:  Negative for pallor and rash.  Neurological:  Negative for syncope and headaches.  Psychiatric/Behavioral:  Negative for agitation and confusion.    Physical Exam Updated Vital Signs BP (!) 137/46   Pulse 80   Temp 98.4 F (36.9 C)   Resp (!) 26   Wt 79.8 kg   SpO2 96%   BMI 23.86 kg/m   Physical Exam Vitals and nursing note reviewed.  Constitutional:      General: He is not in acute distress.    Appearance: He is well-developed.  HENT:     Head: Normocephalic and atraumatic.     Right Ear: External ear normal.     Left Ear: External ear normal.     Mouth/Throat:     Mouth: Mucous membranes are moist.  Eyes:     General: No scleral icterus. Cardiovascular:     Rate and Rhythm: Normal rate and regular rhythm.     Pulses: Normal pulses.     Heart sounds: Normal heart sounds.  Pulmonary:     Effort: Pulmonary effort is normal. Tachypnea present. No respiratory distress.     Breath sounds: Decreased breath sounds and wheezing present.     Comments: Conversational dyspnea Abdominal:     General: Abdomen is flat.     Palpations: Abdomen is soft.     Tenderness: There is no abdominal tenderness.  Musculoskeletal:        General: Normal range of motion.  Cervical back: Normal range of motion.     Right  lower leg: No edema.     Left lower leg: No edema.  Skin:    General: Skin is warm and dry.     Capillary Refill: Capillary refill takes less than 2 seconds.  Neurological:     Mental Status: He is alert and oriented to person, place, and time.  Psychiatric:        Mood and Affect: Mood normal.        Behavior: Behavior normal.   ED Results / Procedures / Treatments   Labs (all labs ordered are listed, but only abnormal results are displayed) Labs Reviewed  CBC WITH DIFFERENTIAL/PLATELET - Abnormal; Notable for the following components:      Result Value   WBC 11.6 (*)    RBC 3.51 (*)    Hemoglobin 11.9 (*)    HCT 35.2 (*)    MCV 100.3 (*)    Neutro Abs 9.7 (*)    All other components within normal limits  BRAIN NATRIURETIC PEPTIDE - Abnormal; Notable for the following components:   B Natriuretic Peptide 461.4 (*)    All other components within normal limits  COMPREHENSIVE METABOLIC PANEL - Abnormal; Notable for the following components:   Sodium 133 (*)    CO2 16 (*)    Glucose, Bld 184 (*)    BUN 43 (*)    Creatinine, Ser 1.85 (*)    Total Protein 6.3 (*)    AST 43 (*)    ALT 51 (*)    GFR, Estimated 35 (*)    All other components within normal limits  CBG MONITORING, ED - Abnormal; Notable for the following components:   Glucose-Capillary 203 (*)    All other components within normal limits  TROPONIN I (HIGH SENSITIVITY) - Abnormal; Notable for the following components:   Troponin I (High Sensitivity) 24 (*)    All other components within normal limits  RESP PANEL BY RT-PCR (FLU A&B, COVID) ARPGX2  EXPECTORATED SPUTUM ASSESSMENT W GRAM STAIN, RFLX TO RESP C  BLOOD GAS, VENOUS  PROCALCITONIN  TROPONIN I (HIGH SENSITIVITY)    EKG EKG Interpretation  Date/Time:  Thursday November 09 2021 12:07:28 EST Ventricular Rate:  107 PR Interval:  169 QRS Duration: 103 QT Interval:  340 QTC Calculation: 372 R Axis:   16 Text Interpretation: Sinus tachycardia  Premature ventricular complexes Similar to prior tracing Confirmed by Wynona Dove ([id number]) on 11/09/2021 2:42:17 PM  Radiology DG Chest 2 View  Result Date: 11/09/2021 CLINICAL DATA:  Shortness of breath, cough EXAM: CHEST - 2 VIEW COMPARISON:  Previous studies including the CT chest done on 08/17/2021 FINDINGS: Cardiac size is within normal limits. Low position of diaphragms suggests COPD. Thoracic aorta is tortuous and ectatic. There are no signs of alveolar pulmonary edema. There is prominence of interstitial markings in the right parahilar region and both lower lung fields which was not evident in the chest radiograph done on 07/23/2017. There is complete clearing of infiltrate in the right apical region since 07/23/2017. IMPRESSION: COPD. There are no signs of alveolar pulmonary edema. There is no focal pulmonary consolidation. There is prominence of interstitial markings in the right parahilar region and both lower lung fields suggesting scarring. Possibility of superimposed interstitial pneumonia is not excluded. There is no pleural effusion or pneumothorax. Electronically Signed   By: Elmer Picker M.D.   On: 11/09/2021 12:48    Procedures .Critical Care Performed by: Jeanell Sparrow,  DO Authorized by: Jeanell Sparrow, DO   Critical care provider statement:    Critical care time (minutes):  36   Critical care time was exclusive of:  Separately billable procedures and treating other patients   Critical care was necessary to treat or prevent imminent or life-threatening deterioration of the following conditions:  Respiratory failure   Critical care was time spent personally by me on the following activities:  Development of treatment plan with patient or surrogate, discussions with consultants, evaluation of patient's response to treatment, examination of patient, ordering and review of laboratory studies, ordering and review of radiographic studies, ordering and performing treatments and  interventions, pulse oximetry, re-evaluation of patient's condition and review of old charts   Medications Ordered in ED Medications  lactated ringers infusion (has no administration in time range)  ceFEPIme (MAXIPIME) 2 g in sodium chloride 0.9 % 100 mL IVPB (2 g Intravenous New Bag/Given 11/09/21 1508)  azithromycin (ZITHROMAX) 500 mg in sodium chloride 0.9 % 250 mL IVPB (500 mg Intravenous New Bag/Given 11/09/21 1505)  ipratropium-albuterol (DUONEB) 0.5-2.5 (3) MG/3ML nebulizer solution 3 mL (has no administration in time range)  ceFEPIme (MAXIPIME) 2 g in sodium chloride 0.9 % 100 mL IVPB (has no administration in time range)    ED Course  I have reviewed the triage vital signs and the nursing notes.  Pertinent labs & imaging results that were available during my care of the patient were reviewed by me and considered in my medical decision making (see chart for details).    MDM Rules/Calculators/A&P                           CC: Cough, dib  This patient complains of above; this involves an extensive number of treatment options and is a complaint that carries with it a high risk of complications and morbidity. Vital signs were reviewed. Serious etiologies considered.  Patient does not use oxygen at home.  He was placed on 4 L nasal cannula by EMS.  When I took patient off of nasal cannula he began to have worsening conversational dyspnea, pulse ox 89 to 90%.  No home oxygen use.  Diffuse wheezing on auscultation.  Record review:  Previous records obtained and reviewed   Additional history obtained from son at bedside   Work up as above, notable for:   Labs & imaging results that were available during my care of the patient were reviewed by me and considered in my medical decision making.   I ordered imaging studies which included CXR and I independently visualized and interpreted imaging which showed copd/?pna  Given hypoxia, chills, subjective fever, worsening respiratory  status, worsening cough.  Abnormal chest x-ray.  Recommend antibiotics for COPD/CAP.  Will give cefepime and azithromycin given history of COPD.  Patient has tolerated IV azithromycin in the past.   Reassessment:  Continues to have DIB. No respiratory distress on [place]. Recommend admission, pt agreeable, D/w Dr Tamala Julian who accepts pt for admit.          This chart was dictated using voice recognition software.  Despite best efforts to proofread,  errors can occur which can change the documentation meaning.  Final Clinical Impression(s) / ED Diagnoses Final diagnoses:  COPD exacerbation ([place])  Community acquired pneumonia, unspecified laterality  Acute respiratory failure with hypoxia ([place])    Rx / DC Orders ED Discharge Orders     None  Jeanell Sparrow, DO 11/09/21 1535

## 2021-11-09 NOTE — H&P (Addendum)
History and Physical    YAZAN GATLING HLK:[id number] DOB: May 16, 1931 DOA: 11/09/2021  Referring MD/NP/PA: Wynona Dove, MD PCP: [name], MD  Patient coming from: Home  Chief Complaint: Shortness of breath and cough  I have personally briefly reviewed patient's old medical records in [place]   HPI: David [name] is a 87 y.o. male with medical history significant of hypertension, hyperlipidemia, COPD, CKD stage III, recent COVID-19 infection 11/5 by home test who presents with complaints of shortness of breath.  He states that he had tested positive for COVID by home test after his wife tested positive on the third of this month.  He had temporarily seem to be getting better up until this week.  Reports having a deep nonproductive cough and mild intermittent wheeze.  Noted associated symptoms of fever at home up to 101F and intermittent left-sided chest pain.  Normally patient is not on oxygen at baseline.  He had been seen by his PCP 2 days ago and started on cefdinir along with steroids.  Patient reports that he has been using his inhalers and breathing treatments without relief in symptoms.  Intermittently also noting that pulse rate would drop into the 40s.  En route with EMS O2 saturations were noted to be in the 80s on room air for which patient was placed on 4 L of nasal cannula oxygen with improvement.  ED Course: Upon admission into the emergency department patient was seen to be afebrile, respirations 19-26, heart rate 70-93, blood pressures maintained, and O2 saturations currently maintained on 4 L nasal cannula oxygen.  Labs significant for WBC 11.6, hemoglobin 11.9, BUN 43, creatinine 1.85, and BNP 461.4, high-sensitivity troponin 16->24.   Chest x-ray noted COPD with prominent interstitial prominence concerning for either scarring or possible superimposed infection without clear signs of edema.  Patient has been started on empiric antibiotics of cefepime and  azithromycin.  However, patient developed itching with azithromycin infusion for which it was discontinued.  Review of Systems  Constitutional:  Positive for chills, fever and malaise/fatigue.  HENT:  Positive for congestion. Negative for nosebleeds.   Eyes:  Negative for photophobia and pain.  Respiratory:  Positive for cough, shortness of breath and wheezing.   Cardiovascular:  Positive for chest pain. Negative for leg swelling.  Gastrointestinal:  Negative for abdominal pain, nausea and vomiting.  Genitourinary:  Negative for dysuria and hematuria.  Skin:  Negative for rash.  Neurological:  Positive for weakness. Negative for loss of consciousness.  Psychiatric/Behavioral:  Negative for substance abuse.    Past Medical History:  Diagnosis Date   AAA (abdominal aortic aneurysm)    AAA (abdominal aortic aneurysm)    AAA (abdominal aortic aneurysm)    Atherosclerosis    pt denies this   BPH (benign prostatic hyperplasia)    CKD (chronic kidney disease), stage III (HCC)    family reports caused by scans but that has resolved since CT scans have stopped   Colon cancer ([place])    COPD (chronic obstructive pulmonary disease) ([place])    Diabetes mellitus    ED (erectile dysfunction)    Gastroesophageal reflux disease    Heart murmur    Hyperlipidemia    Hypertension    Lung nodules    Neuromuscular disorder ([place])    Neuropathy feet   Osteopenia    Peripheral vascular disease ([place])    Pneumonia June 2013   Positional vertigo    intermittent    Shortness of breath  chronic per patient   Stroke [facility])    x2 in 2006   Stroke [facility])    Thyroid cancer ([place])    Thyroid disease    Type 2 diabetes mellitus ([place])    Vitamin D deficiency     Past Surgical History:  Procedure Laterality Date   ABDOMINAL AORTIC ANEURYSM REPAIR N/A 11/08/2014   Procedure: ANEURYSM ABDOMINAL AORTIC REPAIR;  Surgeon: Rosetta Posner, MD;  Location: [facility];  Service: Vascular;  Laterality: N/A;   ABDOMINAL  AORTIC ANEURYSM REPAIR     COLON RESECTION  2007   COLON SURGERY  2006   Resection   CYSTOSCOPY W/ RETROGRADES Bilateral 12/30/2020   Procedure: CYSTOSCOPY WITH BILATERAL RETROGRADE;  Surgeon: Irine Seal, MD;  Location: [facility];  Service: Urology;  Laterality: Bilateral;   CYSTOSCOPY WITH BIOPSY N/A 12/30/2020   Procedure: CYSTOSCOPY WITH BIOPSY AND FULGURATION;  Surgeon: Irine Seal, MD;  Location: [facility];  Service: Urology;  Laterality: N/A;   SEPTOPLASTY     THYROIDECTOMY  2007   TRANSURETHRAL RESECTION OF BLADDER TUMOR WITH MITOMYCIN-C N/A 02/12/2017   Procedure: TRANSURETHRAL RESECTION OF BLADDER TUMOR;  Surgeon: Irine Seal, MD;  Location: [facility];  Service: Urology;  Laterality: N/A;     reports that he quit smoking about 16 years ago. His smoking use included cigarettes. He has a 75.00 pack-year smoking history. He has never used smokeless tobacco. He reports that he does not drink alcohol and does not use drugs.  Allergies  Allergen Reactions   Azithromycin Rash    zpack   Doxycycline Rash    Family History  Problem Relation Age of Onset   Stomach cancer Mother    Heart failure Mother    Breast cancer Mother    Pneumonia Father    Diabetes Brother    Heart disease Brother        Heart Disease before age 25,  AAA and  Carotid   Heart attack Brother    Diabetes Paternal Uncle     Prior to Admission medications   Medication Sig Start Date End Date Taking? Authorizing Provider  acetaminophen (TYLENOL) 500 MG tablet Take 500-1,000 mg by mouth See admin instructions. Take 500 mg daily at bedtime and may take an additional 500mg  tablet if needed for pain   Yes [provider]  allopurinol (ZYLOPRIM) 100 MG tablet Take 150 mg by mouth daily.   Yes [provider]  cefdinir (OMNICEF) 300 MG capsule Take 300 mg by mouth 2 (two) times daily. For 7 days 11/07/21  Yes [provider]  clopidogrel (PLAVIX) 75 MG tablet Take 75 mg by mouth at bedtime.    Yes  [provider]  COMBIVENT RESPIMAT 20-100 MCG/ACT AERS respimat Inhale 1 puff into the lungs in the morning and at bedtime. 01/06/20  Yes [provider]  diltiazem (CARDIZEM CD) 120 MG 24 hr capsule Take 1 capsule (120 mg total) by mouth daily. 05/24/21  Yes Chandrasekhar, Mahesh A, MD  fexofenadine (ALLEGRA) 180 MG tablet Take 180 mg by mouth at bedtime. 09/18/17  Yes [provider]  Fluticasone-Salmeterol (ADVAIR) 250-50 MCG/DOSE AEPB Inhale 1 puff into the lungs every 12 (twelve) hours.   Yes [provider]  HUMALOG KWIKPEN 100 UNIT/ML KwikPen Inject 4 Units into the skin See admin instructions. 4 units in the morning and 4 units late afternoon/supper time 02/29/20  Yes [provider]  ipratropium-albuterol (DUONEB) 0.5-2.5 (3) MG/3ML SOLN Take 3 mLs by nebulization 2 (two)  times daily as needed for wheezing or shortness of breath. 05/31/21  Yes [provider]  LEVEMIR FLEXTOUCH 100 UNIT/ML Pen Inject 7 Units into the skin at bedtime. 04/05/15  Yes [provider]  levothyroxine (SYNTHROID, LEVOTHROID) 125 MCG tablet Take 62.5-125 mcg by mouth See admin instructions. 62.5 mcg daily on Sundays, and 125 mcg all other days   Yes [provider]  LORazepam (ATIVAN) 0.5 MG tablet Take 0.25-0.5 mg by mouth daily as needed for anxiety. 02/10/21  Yes [provider]  mirtazapine (REMERON SOL-TAB) 15 MG disintegrating tablet Take 15 mg by mouth at bedtime. 12/28/16  Yes [provider]  predniSONE (DELTASONE) 5 MG tablet Take 5-30 mg by mouth See admin instructions. 6 day tapering dose pack.   Yes [provider]  rosuvastatin (CRESTOR) 10 MG tablet Take 10 mg by mouth at bedtime. 10/18/19  Yes [provider]  valsartan-hydrochlorothiazide (DIOVAN-HCT) 80-12.5 MG tablet Take 1 tablet by mouth daily. 09/16/19  Yes [provider]  Vitamin D, Ergocalciferol, (DRISDOL) 50000 UNITS CAPS Take 50,000  Units by mouth every Monday.    Yes [provider]    Physical Exam:  Constitutional: Elderly male who appears to be acutely ill Vitals:   11/09/21 1330 11/09/21 1342 11/09/21 1345 11/09/21 1400  BP: (!) 122/46  (!) 137/46   Pulse: (!) 34 76 80   Resp: (!) 23 19 (!) 26   Temp:      TempSrc:      SpO2: 93% 94% 96%   Weight:    79.8 kg   Eyes: PERRL, lids and conjunctivae normal ENMT: Mucous membranes are moist. Posterior pharynx clear of any exudate or lesions.  Neck: normal, supple, no masses, no thyromegaly Respiratory: Decreased aeration with positive crackles heard in the mid to lower lung fields bilaterally with mild wheeze. Cardiovascular: Irregular, no murmurs / rubs / gallops. No extremity edema.  Abdomen: no tenderness, no masses palpated. No hepatosplenomegaly. Bowel sounds positive.  Musculoskeletal: no clubbing / cyanosis. No joint deformity upper and lower extremities. Good ROM, no contractures. Normal muscle tone.  Skin: no rashes, lesions, ulcers. No induration Neurologic: CN 2-12 grossly intact. Sensation intact, DTR normal. Strength 5/5 in all 4.  Psychiatric: Normal judgment and insight. Alert and oriented x 3. Normal mood.     Labs on Admission: I have personally reviewed following labs and imaging studies  CBC: Recent Labs  Lab 11/09/21 1219  WBC 11.6*  NEUTROABS 9.7*  HGB 11.9*  HCT 35.2*  MCV 100.3*  PLT 468   Basic Metabolic Panel: Recent Labs  Lab 11/09/21 1219  NA 133*  K 3.7  CL 103  CO2 16*  GLUCOSE 184*  BUN 43*  CREATININE 1.85*  CALCIUM 9.1   GFR: Estimated Creatinine Clearance: 30.9 mL/min (A) (by C-G formula based on SCr of 1.85 mg/dL (H)). Liver Function Tests: Recent Labs  Lab 11/09/21 1219  AST 43*  ALT 51*  ALKPHOS 67  BILITOT 0.7  PROT 6.3*  ALBUMIN 3.5   No results for input(s): LIPASE, AMYLASE in the last 168 hours. No results for input(s): AMMONIA in the last 168 hours. Coagulation Profile: No  results for input(s): INR, PROTIME in the last 168 hours. Cardiac Enzymes: No results for input(s): CKTOTAL, CKMB, CKMBINDEX, TROPONINI in the last 168 hours. BNP (last 3 results) No results for input(s): PROBNP in the last 8760 hours. HbA1C: No results for input(s): HGBA1C in the last 72 hours. CBG: Recent Labs  Lab  11/09/21 1341  GLUCAP 203*   Lipid Profile: No results for input(s): CHOL, HDL, LDLCALC, TRIG, CHOLHDL, LDLDIRECT in the last 72 hours. Thyroid Function Tests: No results for input(s): TSH, T4TOTAL, FREET4, T3FREE, THYROIDAB in the last 72 hours. Anemia Panel: No results for input(s): VITAMINB12, FOLATE, FERRITIN, TIBC, IRON, RETICCTPCT in the last 72 hours. Urine analysis:    Component Value Date/Time   COLORURINE STRAW (A) 07/23/2017 1839   APPEARANCEUR CLEAR 07/23/2017 1839   LABSPEC 1.012 07/23/2017 1839   PHURINE 5.0 07/23/2017 1839   GLUCOSEU NEGATIVE 07/23/2017 1839   HGBUR MODERATE (A) 07/23/2017 1839   BILIRUBINUR NEGATIVE 07/23/2017 [place] 07/23/2017 1839   PROTEINUR NEGATIVE 07/23/2017 1839   UROBILINOGEN 0.2 06/23/2015 1110   NITRITE NEGATIVE 07/23/2017 1839   LEUKOCYTESUR TRACE (A) 07/23/2017 1839   Sepsis Labs: No results found for this or any previous visit (from the past 240 hour(s)).   Radiological Exams on Admission: DG Chest 2 View  Result Date: 11/09/2021 CLINICAL DATA:  Shortness of breath, cough EXAM: CHEST - 2 VIEW COMPARISON:  Previous studies including the CT chest done on 08/17/2021 FINDINGS: Cardiac size is within normal limits. Low position of diaphragms suggests COPD. Thoracic aorta is tortuous and ectatic. There are no signs of alveolar pulmonary edema. There is prominence of interstitial markings in the right parahilar region and both lower lung fields which was not evident in the chest radiograph done on 07/23/2017. There is complete clearing of infiltrate in the right apical region since 07/23/2017. IMPRESSION:  COPD. There are no signs of alveolar pulmonary edema. There is no focal pulmonary consolidation. There is prominence of interstitial markings in the right parahilar region and both lower lung fields suggesting scarring. Possibility of superimposed interstitial pneumonia is not excluded. There is no pleural effusion or pneumothorax. Electronically Signed   By: Elmer Picker M.D.   On: 11/09/2021 12:48    EKG: Independently reviewed.  Sinus tachycardia at 107 bpm with PVCs  Assessment/Plan Acute respiratory failure with hypoxia: Patient presents with complaints of worsening cough and shortness of breath. He had been recently placed on cefdinir by his PCP along with steroids without improvement.  O2 saturations noted to be in the low 80s on room air for which patient was placed on 3 L nasal cannula oxygen.  On physical exam patient noted to have crackles on mid to lower lung fields.  Suspect possibly multifactorial in nature at this time as noted below -Admit to a cardiac telemetry bed -Continuous pulse oximetry with nasal cannula oxygen maintain O2 saturation greater than 92% -Wean to room air as able  Possible community-acquired pneumonia: Patient reported to still be having chills and intermittent fevers.   Chest x-ray could not exclude possible pneumonia.  Patient has been started on empiric antibiotics of cefepime and azithromycin initially.  Azithromycin was discontinued due to itching and prior noted allergy. -Check procalcitonin -Continue empiric antibiotics with cefepime for now  Diastolic congestive heart failure: Possible acute on chronic.  Last echocardiogram noted EF of 55 to 60% with grade 1 diastolic dysfunction back in 04/2021.  On physical exam patient was found to have crackles and BNP was elevated 461.4.  Other labs noted mild transaminase which could be secondary to passive congestion and hyponatremia.  Chest x-ray reported prominence of interstitial markings in the right  perihilar region in both lower lung fields, but suspect this possibly could be fluid. -Strict INO's and daily weight -Lasix 40 mg IV x1 dose -Reassess  in a.m. and determine need of further IV diuresis  COPD, with possible acute exacerbation: Patient with some mild expiratory wheezes.  Had recently been started on steroids.  Notes that he has been using his inhalers without improvement in symptoms -DuoNebs 4 times daily -Brovana and budesonide nebs substitution for Advair -Solu-Medrol 40 mg IV twice daily  Leukocytosis: Patient was noted to be tachypneic with WBC 11.6 not quite meeting SIRS criteria.  Leukocytosis could be related with patient starting steroids. -Continue to monitor  Elevated troponin: Acute. High-sensitivity troponin 16->24.  Patient reported that he intermittently has been having left-sided chest pain, but denies any of those complaints at this time. -Continue to trend cardiac troponin  PVCs: Patient is followed in outpatient settingby Dr. Loletha Grayer.  Reviewing telemetry heart rates intermittently noted to be in the 40s and secondary to frequent PVCs it seems. -Check magnesium levels -Follow-up telemetry overnight  History of COVID-19: Patient reports being positive for COVID-19 on 11/5 at home test after his wife.  Remdesivir likely of note use as over 10 days since initial patient initially tested positive. -Symptomatic treatment at this time  Essential hypertension: Blood pressures currently maintained.  Home blood pressure medications include diltiazem 125 mg daily, and valsartan-hydrochlorothiazide 80-12.5 mg daily. -Continue diltiazem -Held valsartan hydrochlorothiazide due to kidney function  Diabetes mellitus type 2: On admission glucose was 184.  Last hemoglobin A1c 7.4 on 12/30/2020.  Home regimen includes Levemir 7 units nightly and Humalog 4 units at lunch and dinner. -Hypoglycemic protocols -Continue home Levemir nightly -CBGs before every meal with moderate  SSI  Macrocytic anemia: Hemoglobin 11.9 with MCV 100.3.  No reports of bleeding. -Continue to monitor  Chronic kidney disease stage III: Creatinine 1.85 with BUN 45 this appears to be near patient's baseline. -Continue to monitor kidney function daily  History of CVA  -Continue statin and Plavix  Hypothyroidism -Continue levothyroxine  AAA without rupture: Last noted to be 4 cm CT chest in 2021. -Recommend annual screening  Anxiety -Continue Ativan as needed and mirtazapine at night  Transaminitis: Acute.  AST 43 and ALT 51. -Recheck CMP in a.m.  Hyperlipidemia -Continue Crestor, but monitor liver function studies  DVT prophylaxis: Heparin Code Status: Full Family Communication: Son updated at bedside Disposition Plan: Likely discharge home once medically stable Consults called: None Admission status: Inpatient, require more than 2 midnight stay "  Norval Morton MD [facility]   If 7PM-7AM, please contact night-coverage   11/09/2021, 5:29 PM

## 2021-11-09 NOTE — ED Triage Notes (Signed)
Pt BIB EMS from home c/o SOB. Per EMS, upon EMS arrival, pt's o2 was in the 80s, EMS had pt on 4L. Pt had  Covid few weeks ago. Hx copd.   125 solumedrol  1 Neb  Sbg 135

## 2021-11-09 NOTE — Progress Notes (Signed)
Pharmacy Antibiotic Note  David [name] is a 87 y.o. male admitted on 11/09/2021 with cough and dyspnea x 2wks 2/2 COVID-19 infection. PMH significant for COPD, HTN, DM. Pharmacy has been consulted for cefepime dosing.  Plan: Cefepime 2g q12h  Follow-up renal function and adjust timing as needed   Temp (24hrs), Avg:98.3 F (36.8 C), Min:98.1 F (36.7 C), Max:98.4 F (36.9 C)  Recent Labs  Lab 11/09/21 1219  WBC 11.6*  CREATININE 1.85*    CrCl cannot be calculated (Unknown ideal weight.).    Allergies  Allergen Reactions   Azithromycin Rash    zpack   Doxycycline Rash    Antimicrobials this admission: Azithromycin 11/17 > Cefepime 11/17>   Dose adjustments this admission:  Microbiology results: 11/17 Sputum: sent  11/17 COVID: sent 11/17 Flu: sent  Thank you for allowing pharmacy to participate in this patient's care.  Levonne Spiller, PharmD PGY1 Acute Care Resident  11/09/2021,6:57 PM

## 2021-11-09 NOTE — ED Notes (Signed)
With assistance pt used BSC, son at bedside.

## 2021-11-10 ENCOUNTER — Inpatient Hospital Stay (HOSPITAL_COMMUNITY): Payer: Medicare Other

## 2021-11-10 ENCOUNTER — Other Ambulatory Visit (HOSPITAL_COMMUNITY): Payer: Medicare Other

## 2021-11-10 DIAGNOSIS — J9621 Acute and chronic respiratory failure with hypoxia: Secondary | ICD-10-CM | POA: Diagnosis not present

## 2021-11-10 DIAGNOSIS — J189 Pneumonia, unspecified organism: Secondary | ICD-10-CM

## 2021-11-10 DIAGNOSIS — E1122 Type 2 diabetes mellitus with diabetic chronic kidney disease: Secondary | ICD-10-CM

## 2021-11-10 DIAGNOSIS — Z794 Long term (current) use of insulin: Secondary | ICD-10-CM

## 2021-11-10 DIAGNOSIS — N183 Chronic kidney disease, stage 3 unspecified: Secondary | ICD-10-CM

## 2021-11-10 DIAGNOSIS — I10 Essential (primary) hypertension: Secondary | ICD-10-CM

## 2021-11-10 DIAGNOSIS — D72829 Elevated white blood cell count, unspecified: Secondary | ICD-10-CM

## 2021-11-10 LAB — MAGNESIUM: Magnesium: 1.8 mg/dL (ref 1.7–2.4)

## 2021-11-10 LAB — TSH: TSH: 0.096 u[IU]/mL — ABNORMAL LOW (ref 0.350–4.500)

## 2021-11-10 LAB — CBC
HCT: 34.2 % — ABNORMAL LOW (ref 39.0–52.0)
Hemoglobin: 11.6 g/dL — ABNORMAL LOW (ref 13.0–17.0)
MCH: 33.5 pg (ref 26.0–34.0)
MCHC: 33.9 g/dL (ref 30.0–36.0)
MCV: 98.8 fL (ref 80.0–100.0)
Platelets: 187 10*3/uL (ref 150–400)
RBC: 3.46 MIL/uL — ABNORMAL LOW (ref 4.22–5.81)
RDW: 13.2 % (ref 11.5–15.5)
WBC: 8.4 10*3/uL (ref 4.0–10.5)
nRBC: 0 % (ref 0.0–0.2)

## 2021-11-10 LAB — CBG MONITORING, ED
Glucose-Capillary: 168 mg/dL — ABNORMAL HIGH (ref 70–99)
Glucose-Capillary: 281 mg/dL — ABNORMAL HIGH (ref 70–99)
Glucose-Capillary: 300 mg/dL — ABNORMAL HIGH (ref 70–99)

## 2021-11-10 LAB — COMPREHENSIVE METABOLIC PANEL
ALT: 53 U/L — ABNORMAL HIGH (ref 0–44)
AST: 40 U/L (ref 15–41)
Albumin: 3.2 g/dL — ABNORMAL LOW (ref 3.5–5.0)
Alkaline Phosphatase: 59 U/L (ref 38–126)
Anion gap: 12 (ref 5–15)
BUN: 50 mg/dL — ABNORMAL HIGH (ref 8–23)
CO2: 19 mmol/L — ABNORMAL LOW (ref 22–32)
Calcium: 9.4 mg/dL (ref 8.9–10.3)
Chloride: 106 mmol/L (ref 98–111)
Creatinine, Ser: 1.83 mg/dL — ABNORMAL HIGH (ref 0.61–1.24)
GFR, Estimated: 35 mL/min — ABNORMAL LOW (ref >60)
Glucose, Bld: 264 mg/dL — ABNORMAL HIGH (ref 70–99)
Potassium: 4.1 mmol/L (ref 3.5–5.1)
Sodium: 137 mmol/L (ref 135–145)
Total Bilirubin: 0.7 mg/dL (ref 0.3–1.2)
Total Protein: 6 g/dL — ABNORMAL LOW (ref 6.5–8.1)

## 2021-11-10 LAB — I-STAT VENOUS BLOOD GAS, ED
Acid-base deficit: 4 mmol/L — ABNORMAL HIGH (ref 0.0–2.0)
Bicarbonate: 19.5 mmol/L — ABNORMAL LOW (ref 20.0–28.0)
Calcium, Ion: 1.19 mmol/L (ref 1.15–1.40)
HCT: 32 % — ABNORMAL LOW (ref 39.0–52.0)
Hemoglobin: 10.9 g/dL — ABNORMAL LOW (ref 13.0–17.0)
O2 Saturation: 98 %
Potassium: 4.1 mmol/L (ref 3.5–5.1)
Sodium: 138 mmol/L (ref 135–145)
TCO2: 20 mmol/L — ABNORMAL LOW (ref 22–32)
pCO2, Ven: 29.9 mmHg — ABNORMAL LOW (ref 44.0–60.0)
pH, Ven: 7.422 (ref 7.250–7.430)
pO2, Ven: 98 mmHg — ABNORMAL HIGH (ref 32.0–45.0)

## 2021-11-10 LAB — ECHOCARDIOGRAM COMPLETE
AR max vel: 1.85 cm2
AV Area VTI: 1.86 cm2
AV Area mean vel: 1.84 cm2
AV Mean grad: 11 mmHg
AV Peak grad: 20.3 mmHg
Ao pk vel: 2.25 m/s
Height: 72 in
Weight: 2800 oz

## 2021-11-10 LAB — GLUCOSE, CAPILLARY
Glucose-Capillary: 280 mg/dL — ABNORMAL HIGH (ref 70–99)
Glucose-Capillary: 225 mg/dL — ABNORMAL HIGH (ref 70–99)
Glucose-Capillary: 195 mg/dL — ABNORMAL HIGH (ref 70–99)

## 2021-11-10 LAB — HEMOGLOBIN A1C
Hgb A1c MFr Bld: 6.7 % — ABNORMAL HIGH (ref 4.8–5.6)
Mean Plasma Glucose: 145.59 mg/dL

## 2021-11-10 LAB — C-REACTIVE PROTEIN: CRP: 6.7 mg/dL — ABNORMAL HIGH (ref <1.0)

## 2021-11-10 MED ORDER — METHYLPREDNISOLONE SODIUM SUCC 125 MG IJ SOLR
80.0000 mg | INTRAMUSCULAR | Status: DC
Start: 2021-11-10 — End: 2021-11-11
  Administered 2021-11-10 – 2021-11-11 (×2): 80 mg via INTRAVENOUS
  Filled 2021-11-10 (×2): qty 2

## 2021-11-10 MED ORDER — INSULIN ASPART 100 UNIT/ML IJ SOLN
4.0000 [IU] | Freq: Three times a day (TID) | INTRAMUSCULAR | Status: DC
  Administered 2021-11-10 – 2021-11-14 (×11): 4 [IU] via SUBCUTANEOUS

## 2021-11-10 MED ORDER — FUROSEMIDE 40 MG PO TABS
60.0000 mg | ORAL_TABLET | Freq: Every day | ORAL | Status: DC
  Administered 2021-11-10: 60 mg via ORAL
  Filled 2021-11-10: qty 3

## 2021-11-10 MED ORDER — INSULIN GLARGINE-YFGN 100 UNIT/ML ~~LOC~~ SOLN
12.0000 [IU] | Freq: Every day | SUBCUTANEOUS | Status: DC
Start: 2021-11-10 — End: 2021-11-11
  Administered 2021-11-10 – 2021-11-11 (×2): 12 [IU] via SUBCUTANEOUS
  Filled 2021-11-10 (×2): qty 0.12

## 2021-11-10 MED ORDER — IPRATROPIUM-ALBUTEROL 0.5-2.5 (3) MG/3ML IN SOLN
3.0000 mL | Freq: Three times a day (TID) | RESPIRATORY_TRACT | Status: DC
  Administered 2021-11-10 – 2021-11-14 (×12): 3 mL via RESPIRATORY_TRACT
  Filled 2021-11-10 (×9): qty 3

## 2021-11-10 MED ORDER — BUDESONIDE 0.25 MG/2ML IN SUSP
0.2500 mg | Freq: Two times a day (BID) | RESPIRATORY_TRACT | Status: DC
  Administered 2021-11-10 – 2021-11-14 (×8): 0.25 mg via RESPIRATORY_TRACT
  Filled 2021-11-10 (×8): qty 2

## 2021-11-10 NOTE — Plan of Care (Signed)
  Problem: Clinical Measurements: Goal: Respiratory complications will improve Outcome: Progressing   Problem: Activity: Goal: Risk for activity intolerance will decrease Outcome: Progressing   

## 2021-11-10 NOTE — Progress Notes (Signed)
  Echocardiogram 2D Echocardiogram has been performed.  Merrie Roof F 11/10/2021, 5:58 PM

## 2021-11-10 NOTE — Progress Notes (Signed)
NEW ADMISSION NOTE New Admission Note:   Arrival Method:  Stretcher Mental Orientation:  A&O x 4 Telemetry: box 11 Assessment: Completed Skin: intact IV: Right and Left fore arm  Pain: Denies Tubes: non Safety Measures: Safety Fall Prevention Plan has been given, discussed and signed Admission: Completed [facility] [place] Orientation: Patient has been orientated to the room, unit and staff.  Family: non bedside patient is calling them  Orders have been reviewed and implemented. Will continue to monitor the patient. Call light has been placed within reach and bed alarm has been activated.   Berneta Levins, RN

## 2021-11-10 NOTE — Evaluation (Signed)
Physical Therapy Evaluation Patient Details Name: David [name] MRN: [id number] DOB: June 14, 1931 Today's Date: 11/10/2021  History of Present Illness  The pt is an 87 yo male presenting 11/17 with SOB and SPO2 in 80s. PMH includes: AAA, CKD III, COPD, DM II, HLD, HTN, CVA, and recent COVID (pos test on 11/5).   Clinical Impression  Pt in bed upon arrival of PT, agreeable to evaluation at this time. Prior to admission the pt was independent with all mobility without need for DME or assist, was still driving and completing IADLs such as mowing his lawn. The pt now presents with limitations in functional mobility, power, activity tolerance, and dynamic stability due to above dx, and will continue to benefit from skilled PT to address these deficits. The pt was able to demo good independence with bed mobility, but requires at least single UE support to complete sit-stand transfers and gait at this time. He complete x2 bouts of 15 ft ambulation in the room with 2/4 DOE and Hr increased from 79bpm to 99bpm. The pt's O2 was disconnected upon my arrival, and he reported no SOB so I conducted the session on RA based on the pt's sx as we were unable to get reliable SpO2 reading. Suspect the pt will progress well with continued mobility acutely and HHPT following d/c to further maximize functional recovery and facilitate return to independence.         Recommendations for follow up therapy are one component of a multi-disciplinary discharge planning process, led by the attending physician.  Recommendations may be updated based on patient status, additional functional criteria and insurance authorization.  Follow Up Recommendations Home health PT    Assistance Recommended at Discharge Intermittent Supervision/Assistance  Functional Status Assessment Patient has had a recent decline in their functional status and demonstrates the ability to make significant improvements in function in a reasonable and  predictable amount of time.  Equipment Recommendations  Rollator (4 wheels);BSC/3in1    Recommendations for Other Services       Precautions / Restrictions Precautions Precautions: Fall Restrictions Weight Bearing Restrictions: No      Mobility  Bed Mobility Overal bed mobility: Needs Assistance Bed Mobility: Supine to Sit;Sit to Supine     Supine to sit: Supervision Sit to supine: Supervision   General bed mobility comments: supervision for safety, no assist needed    Transfers Overall transfer level: Needs assistance Equipment used: Rolling walker (2 wheels) Transfers: Sit to/from Stand Sit to Stand: Min guard           General transfer comment: minG to power up from various surfaces in room, x5 through session    Ambulation/Gait Ambulation/Gait assistance: Min guard Gait Distance (Feet): 15 Feet (x2 with seated rest between) Assistive device: Rolling walker (2 wheels) Gait Pattern/deviations: Step-through pattern;Decreased stride length Gait velocity: decreased Gait velocity interpretation: <1.31 ft/sec, indicative of household ambulator   General Gait Details: pt with UE tremors with wt bearing on RW, no overt LOB, but moving slowly. HR to 99bpm after short bout of mobility in the room, unable to determine valid SpO2 reading, pt 2/4 DOE        Balance Overall balance assessment: Needs assistance Sitting-balance support: Single extremity supported;Feet supported Sitting balance-Leahy Scale: Fair Sitting balance - Comments: pt with posterior lean in sitting, at times with BLE coming off floor   Standing balance support: Bilateral upper extremity supported;During functional activity Standing balance-Leahy Scale: Poor Standing balance comment: dependent on BUE support  Pertinent Vitals/Pain Pain Assessment: No/denies pain    Home Living Family/patient expects to be discharged to:: Private residence Living  Arrangements: Spouse/significant other Available Help at Discharge: Family;Available 24 hours/day Type of Home: House Home Access: Stairs to enter Entrance Stairs-Rails: Right Entrance Stairs-Number of Steps: 2-3   Home Layout: One level Home Equipment: Shower seat;Grab bars - tub/shower;Grab bars - toilet;Rolling Walker (2 wheels);Cane - single point      Prior Function Prior Level of Function : Independent/Modified Independent;Driving             Mobility Comments: pt independent, driving, doing IADLs, mowing his yard       Hand Dominance   Dominant Hand: Left    Extremity/Trunk Assessment   Upper Extremity Assessment Upper Extremity Assessment: Defer to OT evaluation    Lower Extremity Assessment Lower Extremity Assessment: Overall WFL for tasks assessed    Cervical / Trunk Assessment Cervical / Trunk Assessment: Normal  Communication   Communication: No difficulties  Cognition Arousal/Alertness: Awake/alert Behavior During Therapy: WFL for tasks assessed/performed Overall Cognitive Status: Within Functional Limits for tasks assessed                                 General Comments: WFL, following all commands well        General Comments General comments (skin integrity, edema, etc.): HR 79-99bpm with activity during session. pt on RA upon my arrival reporting no SOB, left on RA due to absence of sx, unable to determine valid SpO2 with pulse ox and pt not on any form of O2 monitoring    Exercises Other Exercises Other Exercises: sit-stnad from EOB x5   Assessment/Plan    PT Assessment Patient needs continued PT services  PT Problem List Decreased strength;Decreased range of motion;Decreased activity tolerance;Decreased balance;Decreased coordination;Decreased mobility;Decreased safety awareness       PT Treatment Interventions Gait training;DME instruction;Stair training;Functional mobility training;Therapeutic activities;Therapeutic  exercise;Balance training;Patient/family education    PT Goals (Current goals can be found in the Care Plan section)  Acute Rehab PT Goals Patient Stated Goal: improve mobility PT Goal Formulation: With patient Time For Goal Achievement: 11/24/21 Potential to Achieve Goals: Good    Frequency Min 3X/week    AM-PAC PT "6 Clicks" Mobility  Outcome Measure Help needed turning from your back to your side while in a flat bed without using bedrails?: A Little Help needed moving from lying on your back to sitting on the side of a flat bed without using bedrails?: A Little Help needed moving to and from a bed to a chair (including a wheelchair)?: A Little Help needed standing up from a chair using your arms (e.g., wheelchair or bedside chair)?: A Little Help needed to walk in hospital room?: A Little Help needed climbing 3-5 steps with a railing? : A Lot 6 Click Score: 17    End of Session Equipment Utilized During Treatment: Gait belt Activity Tolerance: Patient tolerated treatment well;Patient limited by fatigue Patient left: in bed;with call bell/phone within reach (with ECHO) Nurse Communication: Mobility status PT Visit Diagnosis: Unsteadiness on feet (R26.81);Other abnormalities of gait and mobility (R26.89);Repeated falls (R29.6);Muscle weakness (generalized) (M62.81);Difficulty in walking, not elsewhere classified (R26.2)    Time: 1710-1735 PT Time Calculation (min) (ACUTE ONLY): 25 min   Charges:   PT Evaluation $PT Eval Low Complexity: 1 Low PT Treatments $Gait Training: 8-22 mins        West Carbo, PT,  DPT   Acute Rehabilitation Department Pager #: [phone]  Sandra Cockayne 11/10/2021, 5:50 PM

## 2021-11-10 NOTE — Progress Notes (Signed)
PROGRESS NOTE        PATIENT DETAILS Name: David [name] Age: 87 y.o. Sex: male Date of Birth: [birth year] Admit Date: 11/09/2021 Admitting Physician Norval Morton, MD DPO:[id number], Elta Guadel[place], MD  Brief Narrative: Patient is a 87 y.o. male with history of TIA, HTN, HLD, COPD, CKD stage IIIb, recent COVID infection on 11/15-presenting with 1 week history of cough, shortness of breath and intermittent fever (PCP had started cefdinir prior to admission).  He was found to have acute hypoxic respiratory failure due to presumed pneumonia.  See below for further details.  Subjective: Still coughing-looks comfortable-on 2-3 L of oxygen to maintain O2 saturations above 90.  Objective: Vitals: Blood pressure 137/75, pulse 84, temperature 97.6 F (36.4 C), temperature source Oral, resp. rate (!) 25, height 6' (1.829 m), weight 79.4 kg, SpO2 92 %.   Exam: Gen Exam:Alert awake-not in any distress HEENT:atraumatic, normocephalic Chest: Some scattered rhonchi all over-few bibasilar rales. CVS:S1S2 regular Abdomen:soft non tender, non distended Extremities:no edema Neurology: Non focal Skin: no rash  Pertinent Labs/Radiology: Recent Labs  Lab 11/09/21 1219 11/10/21 0341 11/10/21 0430  WBC 11.6* 8.4  --   HGB 11.9* 11.6* 10.9*  PLT 201 187  --   NA 133* 137 138  K 3.7 4.1 4.1  CREATININE 1.85* 1.83*  --   AST 43* 40  --   ALT 51* 53*  --   ALKPHOS 67 59  --   BILITOT 0.7 0.7  --     11/17>>CXR: Prominent interstitial markings in the right perihilar region-no focal consolidation 11/18>> CXR: Progressive peripheral interstitial/airspace disease bilaterally.  Assessment/Plan: Acute hypoxic respiratory failure likely due to community-acquired pneumonia vs post COVID inflammatory pneumonitis: Slightly better compared to on initial presentation-but still hypoxic requiring 2-3 L of oxygen to maintain O2 saturations-still coughing-plan is to continue with IV  steroids, IV antimicrobial therapy-and follow clinical course.  Chest x-ray personally reviewed today-suspect he has pneumonia but still remains equivocal-we will obtain a CT chest for better clarity.  We will check inflammatory markers as well and check blood/sputum cultures.  COPD: Not in exacerbation-only few scattered rhonchi-continue bronchodilators.  HFpEF: Not in exacerbation-volume status is stable-BNP elevated.  Will place on low-dose oral Lasix and try and maintain a negative balance.  Obtain updated echo.  DM-2 (A1c 7.4 on 1/7) with uncontrolled hyperglycemia due to steroids: CBGs uncontrolled-increase Semglee to 12 units daily, add 4 units of NovoLog with meals-continue SSI-reassess on 11/19  Recent Labs    11/09/21 1952 11/10/21 0042 11/10/21 0805  GLUCAP 387* 300* 281*    Hypothyroidism: Continue with Synthroid  History of TIA: No focal neurological deficits-continue Plavix/statin  HLD: Continue statin  CKD stage IIIb: Creatinine close to baseline  History of PVCs/PACs: Continue telemetry monitoring-on Cardizem.  HTN: BP stable-on Lasix/Cardizem continue to hold valsartan/HCTZ for now.  Debility: Obtain PT/OT.  BMI Estimated body mass index is 23.73 kg/m as calculated from the following:   Height as of this encounter: 6' (1.829 m).   Weight as of this encounter: 79.4 kg.    Procedures: None Consults: None DVT Prophylaxis: SQ heparin Code Status:Full code  Family Communication: Son-[phone] updated over the phone on 11/18-he will update other family members.  Time spent: 35 minutes-Greater than 50% of this time was spent in counseling, explanation of diagnosis, planning of further management, and coordination of care.  Disposition Plan: Status is: Inpatient  Remains inpatient appropriate because: Hypoxia due to PNA/post-COVID pneumonitis.   Diet: Diet Order             Diet Carb Modified Fluid consistency: Thin; Room service appropriate?  Yes  Diet effective now                     Antimicrobial agents: Anti-infectives (From admission, onward)    Start     Dose/Rate Route Frequency Ordered Stop   11/09/21 2200  ceFEPIme (MAXIPIME) 2 g in sodium chloride 0.9 % 100 mL IVPB        2 g 200 mL/hr over 30 Minutes Intravenous Every 12 hours 11/09/21 1452 11/14/21 0959   11/09/21 1430  ceFEPIme (MAXIPIME) 2 g in sodium chloride 0.9 % 100 mL IVPB        2 g 200 mL/hr over 30 Minutes Intravenous  Once 11/09/21 1417 11/09/21 1540   11/09/21 1430  azithromycin (ZITHROMAX) 500 mg in sodium chloride 0.9 % 250 mL IVPB  Status:  Discontinued        500 mg 250 mL/hr over 60 Minutes Intravenous  Once 11/09/21 1420 11/09/21 1607        MEDICATIONS: Scheduled Meds:  allopurinol  150 mg Oral Daily   arformoterol  15 mcg Nebulization BID   budesonide (PULMICORT) nebulizer solution  0.25 mg Nebulization BID   clopidogrel  75 mg Oral QHS   diltiazem  120 mg Oral Daily   furosemide  60 mg Oral Daily   guaiFENesin  600 mg Oral BID   heparin  5,000 Units Subcutaneous Q8H   insulin aspart  0-15 Units Subcutaneous TID WC   insulin aspart  4 Units Subcutaneous TID WC   insulin glargine-yfgn  12 Units Subcutaneous Daily   ipratropium-albuterol  3 mL Nebulization Once   ipratropium-albuterol  3 mL Nebulization QID   levothyroxine  125 mcg Oral Once per day on Mon Tue Wed Thu Fri Sat   [START ON 11/12/2021] levothyroxine  62.5 mcg Oral Q Sun   loratadine  10 mg Oral Daily   methylPREDNISolone (SOLU-MEDROL) injection  80 mg Intravenous Q24H   mirtazapine  15 mg Oral QHS   rosuvastatin  10 mg Oral QHS   Continuous Infusions:  sodium chloride     ceFEPime (MAXIPIME) IV Stopped (11/09/21 2242)   lactated ringers 150 mL/hr at 11/10/21 0130   PRN Meds:.acetaminophen **OR** acetaminophen, LORazepam   I have personally reviewed following labs and imaging studies  LABORATORY DATA: CBC: Recent Labs  Lab 11/09/21 1219  11/10/21 0341 11/10/21 0430  WBC 11.6* 8.4  --   NEUTROABS 9.7*  --   --   HGB 11.9* 11.6* 10.9*  HCT 35.2* 34.2* 32.0*  MCV 100.3* 98.8  --   PLT 201 187  --     Basic Metabolic Panel: Recent Labs  Lab 11/09/21 1219 11/10/21 0341 11/10/21 0430  NA 133* 137 138  K 3.7 4.1 4.1  CL 103 106  --   CO2 16* 19*  --   GLUCOSE 184* 264*  --   BUN 43* 50*  --   CREATININE 1.85* 1.83*  --   CALCIUM 9.1 9.4  --   MG  --  1.8  --     GFR: Estimated Creatinine Clearance: 31.2 mL/min (A) (by C-G formula based on SCr of 1.83 mg/dL (H)).  Liver Function Tests: Recent Labs  Lab 11/09/21 1219 11/10/21 0341  AST 43*  40  ALT 51* 53*  ALKPHOS 67 59  BILITOT 0.7 0.7  PROT 6.3* 6.0*  ALBUMIN 3.5 3.2*   No results for input(s): LIPASE, AMYLASE in the last 168 hours. No results for input(s): AMMONIA in the last 168 hours.  Coagulation Profile: No results for input(s): INR, PROTIME in the last 168 hours.  Cardiac Enzymes: No results for input(s): CKTOTAL, CKMB, CKMBINDEX, TROPONINI in the last 168 hours.  BNP (last 3 results) No results for input(s): PROBNP in the last 8760 hours.  Lipid Profile: No results for input(s): CHOL, HDL, LDLCALC, TRIG, CHOLHDL, LDLDIRECT in the last 72 hours.  Thyroid Function Tests: Recent Labs    11/10/21 0341  TSH 0.096*    Anemia Panel: No results for input(s): VITAMINB12, FOLATE, FERRITIN, TIBC, IRON, RETICCTPCT in the last 72 hours.  Urine analysis:    Component Value Date/Time   COLORURINE STRAW (A) 07/23/2017 1839   APPEARANCEUR CLEAR 07/23/2017 1839   LABSPEC 1.012 07/23/2017 1839   PHURINE 5.0 07/23/2017 1839   GLUCOSEU NEGATIVE 07/23/2017 1839   HGBUR MODERATE (A) 07/23/2017 1839   BILIRUBINUR NEGATIVE 07/23/2017 1839   KETONESUR NEGATIVE 07/23/2017 1839   PROTEINUR NEGATIVE 07/23/2017 1839   UROBILINOGEN 0.2 06/23/2015 1110   NITRITE NEGATIVE 07/23/2017 1839   LEUKOCYTESUR TRACE (A) 07/23/2017 1839    Sepsis  Labs: Lactic Acid, Venous    Component Value Date/Time   LATICACIDVEN 1.47 06/23/2015 0726    MICROBIOLOGY: Recent Results (from the past 240 hour(s))  Resp Panel by RT-PCR (Flu A&B, Covid) Nasopharyngeal Swab     Status: Abnormal   Collection Time: 11/09/21 12:19 PM   Specimen: Nasopharyngeal Swab; Nasopharyngeal(NP) swabs in vial transport medium  Result Value Ref Range Status   SARS Coronavirus 2 by RT PCR POSITIVE (A) NEGATIVE Final    Comment: RESULT CALLED TO, READ BACK BY AND VERIFIED WITHRicky Ala RN [id number] 11/09/21 A BROWNING (NOTE) SARS-CoV-2 target nucleic acids are DETECTED.  The SARS-CoV-2 RNA is generally detectable in upper respiratory specimens during the acute phase of infection. Positive results are indicative of the presence of the identified virus, but do not rule out bacterial infection or co-infection with other pathogens not detected by the test. Clinical correlation with patient history and other diagnostic information is necessary to determine patient infection status. The expected result is Negative.  Fact Sheet for Patients: EntrepreneurPulse.com.au  Fact Sheet for Healthcare Providers: IncredibleEmployment.be  This test is not yet approved or cleared by the [place] FDA and  has been authorized for detection and/or diagnosis of SARS-CoV-2 by FDA under an Emergency Use Authorization (EUA).  This EUA will remain in effect (meaning this test can b e used) for the duration of  the COVID-19 declaration under Section 564(b)(1) of the Act, 21 U.S.C. section 360bbb-3(b)(1), unless the authorization is terminated or revoked sooner.     Influenza A by PCR NEGATIVE NEGATIVE Final   Influenza B by PCR NEGATIVE NEGATIVE Final    Comment: (NOTE) The Xpert Xpress SARS-CoV-2/FLU/RSV plus assay is intended as an aid in the diagnosis of influenza from Nasopharyngeal swab specimens and should not be used as a sole basis for  treatment. Nasal washings and aspirates are unacceptable for Xpert Xpress SARS-CoV-2/FLU/RSV testing.  Fact Sheet for Patients: EntrepreneurPulse.com.au  Fact Sheet for Healthcare Providers: IncredibleEmployment.be  This test is not yet approved or cleared by the [place] FDA and has been authorized for detection and/or diagnosis of SARS-CoV-2 by FDA under an Emergency Use Authorization (  EUA). This EUA will remain in effect (meaning this test can be used) for the duration of the COVID-19 declaration under Section 564(b)(1) of the Act, 21 U.S.C. section 360bbb-3(b)(1), unless the authorization is terminated or revoked.  Performed at [facility], [place] [address], [place], [place] [address]     RADIOLOGY STUDIES/RESULTS: DG Chest 2 View  Result Date: 11/09/2021 CLINICAL DATA:  Shortness of breath, cough EXAM: CHEST - 2 VIEW COMPARISON:  Previous studies including the CT chest done on 08/17/2021 FINDINGS: Cardiac size is within normal limits. Low position of diaphragms suggests COPD. Thoracic aorta is tortuous and ectatic. There are no signs of alveolar pulmonary edema. There is prominence of interstitial markings in the right parahilar region and both lower lung fields which was not evident in the chest radiograph done on 07/23/2017. There is complete clearing of infiltrate in the right apical region since 07/23/2017. IMPRESSION: COPD. There are no signs of alveolar pulmonary edema. There is no focal pulmonary consolidation. There is prominence of interstitial markings in the right parahilar region and both lower lung fields suggesting scarring. Possibility of superimposed interstitial pneumonia is not excluded. There is no pleural effusion or pneumothorax. Electronically Signed   By: Elmer Picker M.D.   On: 11/09/2021 12:48   DG Chest Port 1V same Day  Result Date: 11/10/2021 CLINICAL DATA:  Shortness of breath. EXAM: PORTABLE CHEST 1  VIEW COMPARISON:  Chest CT 08/17/2020.  Chest x-ray 07/23/2017 FINDINGS: 0726 hours. Lungs are hyperexpanded. Interstitial markings are diffusely coarsened with chronic features. There is peripheral more prominent interstitial and possible airspace opacity in both mid lungs in the left base. This appears progressive since prior chest x-ray and CT of 08/17/2020. The cardiopericardial silhouette is within normal limits for size. Bones are diffusely demineralized. Telemetry leads overlie the chest. IMPRESSION: 1. Emphysema with chronic interstitial lung disease. 2. Progressive peripheral interstitial and airspace disease in the mid lungs bilaterally and left base. Atypical/viral infection could have this appearance. Electronically Signed   By: Misty Stanley M.D.   On: 11/10/2021 07:45     LOS: 1 day   Oren Binet, MD  [facility]    To contact the attending provider between 7A-7P or the covering provider during after hours 7P-7A, please log into the web site [email or web address] and access using universal [place] password for that web site. If you do not have the password, please call the hospital operator.  11/10/2021, 9:14 AM

## 2021-11-10 NOTE — ED Notes (Signed)
Pt's son Quita Skye) would like an update if there are any changes in pt's status or if he's moved to admit bed. Number in pt's chart.

## 2021-11-11 LAB — CBC
HCT: 33.4 % — ABNORMAL LOW (ref 39.0–52.0)
Hemoglobin: 11.7 g/dL — ABNORMAL LOW (ref 13.0–17.0)
MCH: 33.7 pg (ref 26.0–34.0)
MCHC: 35 g/dL (ref 30.0–36.0)
MCV: 96.3 fL (ref 80.0–100.0)
Platelets: 217 10*3/uL (ref 150–400)
RBC: 3.47 MIL/uL — ABNORMAL LOW (ref 4.22–5.81)
RDW: 13.3 % (ref 11.5–15.5)
WBC: 17.2 10*3/uL — ABNORMAL HIGH (ref 4.0–10.5)
nRBC: 0 % (ref 0.0–0.2)

## 2021-11-11 LAB — COMPREHENSIVE METABOLIC PANEL
ALT: 59 U/L — ABNORMAL HIGH (ref 0–44)
AST: 42 U/L — ABNORMAL HIGH (ref 15–41)
Albumin: 3.2 g/dL — ABNORMAL LOW (ref 3.5–5.0)
Alkaline Phosphatase: 61 U/L (ref 38–126)
Anion gap: 12 (ref 5–15)
BUN: 65 mg/dL — ABNORMAL HIGH (ref 8–23)
CO2: 21 mmol/L — ABNORMAL LOW (ref 22–32)
Calcium: 9.2 mg/dL (ref 8.9–10.3)
Chloride: 105 mmol/L (ref 98–111)
Creatinine, Ser: 2.12 mg/dL — ABNORMAL HIGH (ref 0.61–1.24)
GFR, Estimated: 30 mL/min — ABNORMAL LOW (ref >60)
Glucose, Bld: 247 mg/dL — ABNORMAL HIGH (ref 70–99)
Potassium: 3.9 mmol/L (ref 3.5–5.1)
Sodium: 138 mmol/L (ref 135–145)
Total Bilirubin: 0.6 mg/dL (ref 0.3–1.2)
Total Protein: 6.1 g/dL — ABNORMAL LOW (ref 6.5–8.1)

## 2021-11-11 LAB — GLUCOSE, CAPILLARY
Glucose-Capillary: 267 mg/dL — ABNORMAL HIGH (ref 70–99)
Glucose-Capillary: 263 mg/dL — ABNORMAL HIGH (ref 70–99)
Glucose-Capillary: 349 mg/dL — ABNORMAL HIGH (ref 70–99)
Glucose-Capillary: 181 mg/dL — ABNORMAL HIGH (ref 70–99)
Glucose-Capillary: 95 mg/dL (ref 70–99)

## 2021-11-11 LAB — C-REACTIVE PROTEIN: CRP: 3.6 mg/dL — ABNORMAL HIGH (ref <1.0)

## 2021-11-11 LAB — PROCALCITONIN: Procalcitonin: <0.1 ng/mL

## 2021-11-11 MED ORDER — SODIUM CHLORIDE 0.9 % IV SOLN
2.0000 g | INTRAVENOUS | Status: AC
  Administered 2021-11-11 – 2021-11-13 (×3): 2 g via INTRAVENOUS
  Filled 2021-11-11 (×3): qty 20

## 2021-11-11 MED ORDER — INSULIN GLARGINE-YFGN 100 UNIT/ML ~~LOC~~ SOLN
4.0000 [IU] | Freq: Once | SUBCUTANEOUS | Status: AC
  Administered 2021-11-11: 4 [IU] via SUBCUTANEOUS
  Filled 2021-11-11: qty 0.04

## 2021-11-11 MED ORDER — METHYLPREDNISOLONE SODIUM SUCC 40 MG IJ SOLR
40.0000 mg | INTRAMUSCULAR | Status: DC
  Administered 2021-11-12 – 2021-11-13 (×2): 40 mg via INTRAVENOUS
  Filled 2021-11-11 (×2): qty 1

## 2021-11-11 MED ORDER — INSULIN GLARGINE-YFGN 100 UNIT/ML ~~LOC~~ SOLN
16.0000 [IU] | Freq: Every day | SUBCUTANEOUS | Status: DC
Start: 2021-11-12 — End: 2021-11-14
  Administered 2021-11-12 – 2021-11-14 (×3): 16 [IU] via SUBCUTANEOUS
  Filled 2021-11-11 (×3): qty 0.16

## 2021-11-11 NOTE — Plan of Care (Signed)
  Problem: Clinical Measurements: Goal: Will remain free from infection Outcome: Progressing Goal: Respiratory complications will improve Outcome: Progressing   Problem: Activity: Goal: Risk for activity intolerance will decrease Outcome: Progressing   Problem: Nutrition: Goal: Adequate nutrition will be maintained Outcome: Progressing   Problem: Coping: Goal: Level of anxiety will decrease Outcome: Progressing

## 2021-11-11 NOTE — Progress Notes (Signed)
PT Cancellation Note  Patient Details Name: David [name] MRN: [id number] DOB: 14-Oct-1931   Cancelled Treatment:    Reason Eval/Treat Not Completed: Patient declined, no reason specified. Pt up in bedside chair finishing sponge bath with assistance of spouse upon PT arrival. Pt reports fatigue after bathing, having a bowel movement, and sitting up in recliner for majority of the day. Pt declines PT at this time. PT will follow up as time allows.   Zenaida Niece 11/11/2021, 3:12 PM

## 2021-11-11 NOTE — Progress Notes (Signed)
Pt has been on 2 days of cefepime for PNA. He is also COVID+. No hx of resistant organisms. D/w Ghimire and we will optimize cefepime to ceftriaxone to complete 5d.  Onnie Boer, PharmD, BCIDP, AAHIVP, CPP Infectious Disease Pharmacist 11/11/2021 8:46 AM

## 2021-11-11 NOTE — Progress Notes (Addendum)
PROGRESS NOTE        PATIENT DETAILS Name: David [name] Age: 87 y.o. Sex: male Date of Birth: [birth year] Admit Date: 11/09/2021 Admitting Physician Norval Morton, MD BWG:[id number], Elta Gu[place], MD  Brief Narrative: Patient is a 87 y.o. male with history of TIA, HTN, HLD, COPD, CKD stage IIIb, recent COVID infection on 11/15-presenting with 1 week history of cough, shortness of breath and intermittent fever (PCP had started cefdinir prior to admission).  He was found to have acute hypoxic respiratory failure due to presumed pneumonia.  See below for further details.  Subjective: Shortness of breath/cough much better-Down to 2 L of oxygen.  Acknowledges weakness-and SOB with exertion.  Sitting up at bedside chair and eating breakfast when I saw him.  Objective: Vitals: Blood pressure 124/62, pulse 67, temperature (!) 97.4 F (36.3 C), temperature source Oral, resp. rate 17, height 6' (1.829 m), weight 79 kg, SpO2 92 %.   Exam: Gen Exam:Alert awake-not in any distress HEENT:atraumatic, normocephalic Chest: B/L clear to auscultation anteriorly CVS:S1S2 regular Abdomen:soft non tender, non distended Extremities:no edema Neurology: Non focal Skin: no rash   Pertinent Labs/Radiology: Recent Labs  Lab 11/09/21 1219 11/10/21 0341 11/10/21 0430 11/11/21 0319  WBC 11.6* 8.4  --  17.2*  HGB 11.9* 11.6*   < > 11.7*  PLT 201 187  --  217  NA 133* 137   < > 138  K 3.7 4.1   < > 3.9  CREATININE 1.85* 1.83*  --  2.12*  AST 43* 40  --  42*  ALT 51* 53*  --  59*  ALKPHOS 67 59  --  61  BILITOT 0.7 0.7  --  0.6   < > = values in this interval not displayed.   11/18>> blood culture: Pending  11/17>>CXR: Prominent interstitial markings in the right perihilar region-no focal consolidation 11/18>> CXR: Progressive peripheral interstitial/airspace disease bilaterally. 11/18>> CT chest: Bilateral/peripheral opacities-suggesting mild pneumonitis/viral  PNA. 11/18>> Echo: EF 60-65%  Assessment/Plan: Acute hypoxic respiratory failure likely due to community-acquired pneumonia vs post COVID inflammatory pneumonitis: Improved-suspect this is mostly post COVID viral pneumonitis-CRP now downtrending-can narrow ABX to just Rocephin.  Continue Solu-Medrol.  Volume status is stable-does not require diuretics today has developed some mild AKI.  Ambulate with PT/mobilize as much as possible.  Reviewed OT note-he does desaturate with ambulation-May need to assess home O2 requirement when we get closer to discharge.  Follow culture data.  COPD: Not in exacerbation-continue bronchodilators.  HFpEF: Not in exacerbation-volume status stable-hold further dosing of diuretics-given mild bump in creatinine.  Echo with preserved EF.  DM-2 (A1c 7.4 on 1/7) with uncontrolled hyperglycemia due to steroids: CBGs better but still elevated-increase Semglee to 16 units daily-continue 4 units of NovoLog with meals and SSI.  Reassess on 11/20.    Recent Labs    11/10/21 2038 11/11/21 0546 11/11/21 0640  GLUCAP 195* 267* 263*     Hypothyroidism: Continue with Synthroid  History of TIA: No focal neurological deficits-continue Plavix/statin  HLD: Continue statin  AKI CKD stage IIIb: Mild jump in creatinine-probably due to Lasix-Lasix held creatinine-recheck electrolytes in a.m.  Do not think he requires IVF for now.  History of PVCs/PACs: Continue telemetry monitoring-on Cardizem.  HTN: BP stable-on Lasix/Cardizem continue to hold valsartan/HCTZ for now.  Debility: Obtain PT/OT.  BMI Estimated body mass index is 23.62  kg/m as calculated from the following:   Height as of this encounter: 6' (1.829 m).   Weight as of this encounter: 79 kg.    Procedures: None Consults: None DVT Prophylaxis: SQ heparin Code Status:Full code  Family Communication: Son-Dale-[phone] updated over the phone on 11/19-he will update other family members.  Time spent: 35  minutes-Greater than 50% of this time was spent in counseling, explanation of diagnosis, planning of further management, and coordination of care.   Disposition Plan: Status is: Inpatient  Remains inpatient appropriate because: Hypoxia due to PNA/post-COVID pneumonitis.   Diet: Diet Order             Diet Carb Modified Fluid consistency: Thin; Room service appropriate? Yes  Diet effective now                     Antimicrobial agents: Anti-infectives (From admission, onward)    Start     Dose/Rate Route Frequency Ordered Stop   11/11/21 1000  cefTRIAXone (ROCEPHIN) 2 g in sodium chloride 0.9 % 100 mL IVPB        2 g 200 mL/hr over 30 Minutes Intravenous Every 24 hours 11/11/21 0845 11/14/21 0959   11/09/21 2200  ceFEPIme (MAXIPIME) 2 g in sodium chloride 0.9 % 100 mL IVPB  Status:  Discontinued        2 g 200 mL/hr over 30 Minutes Intravenous Every 12 hours 11/09/21 1452 11/11/21 0845   11/09/21 1430  ceFEPIme (MAXIPIME) 2 g in sodium chloride 0.9 % 100 mL IVPB        2 g 200 mL/hr over 30 Minutes Intravenous  Once 11/09/21 1417 11/09/21 1540   11/09/21 1430  azithromycin (ZITHROMAX) 500 mg in sodium chloride 0.9 % 250 mL IVPB  Status:  Discontinued        500 mg 250 mL/hr over 60 Minutes Intravenous  Once 11/09/21 1420 11/09/21 1607        MEDICATIONS: Scheduled Meds:  allopurinol  150 mg Oral Daily   arformoterol  15 mcg Nebulization BID   budesonide (PULMICORT) nebulizer solution  0.25 mg Nebulization BID   clopidogrel  75 mg Oral QHS   diltiazem  120 mg Oral Daily   guaiFENesin  600 mg Oral BID   heparin  5,000 Units Subcutaneous Q8H   insulin aspart  0-15 Units Subcutaneous TID WC   insulin aspart  4 Units Subcutaneous TID WC   insulin glargine-yfgn  12 Units Subcutaneous Daily   ipratropium-albuterol  3 mL Nebulization TID   levothyroxine  125 mcg Oral Once per day on Mon Tue Wed Thu Fri Sat   [START ON 11/12/2021] levothyroxine  62.5 mcg Oral Q Sun    loratadine  10 mg Oral Daily   methylPREDNISolone (SOLU-MEDROL) injection  80 mg Intravenous Q24H   mirtazapine  15 mg Oral QHS   rosuvastatin  10 mg Oral QHS   Continuous Infusions:  cefTRIAXone (ROCEPHIN)  IV 2 g (11/11/21 0940)   PRN Meds:.acetaminophen **OR** acetaminophen, LORazepam   I have personally reviewed following labs and imaging studies  LABORATORY DATA: CBC: Recent Labs  Lab 11/09/21 1219 11/10/21 0341 11/10/21 0430 11/11/21 0319  WBC 11.6* 8.4  --  17.2*  NEUTROABS 9.7*  --   --   --   HGB 11.9* 11.6* 10.9* 11.7*  HCT 35.2* 34.2* 32.0* 33.4*  MCV 100.3* 98.8  --  96.3  PLT 201 187  --  217     Basic Metabolic Panel:  Recent Labs  Lab 11/09/21 1219 11/10/21 0341 11/10/21 0430 11/11/21 0319  NA 133* 137 138 138  K 3.7 4.1 4.1 3.9  CL 103 106  --  105  CO2 16* 19*  --  21*  GLUCOSE 184* 264*  --  247*  BUN 43* 50*  --  65*  CREATININE 1.85* 1.83*  --  2.12*  CALCIUM 9.1 9.4  --  9.2  MG  --  1.8  --   --      GFR: Estimated Creatinine Clearance: 26.9 mL/min (A) (by C-G formula based on SCr of 2.12 mg/dL (H)).  Liver Function Tests: Recent Labs  Lab 11/09/21 1219 11/10/21 0341 11/11/21 0319  AST 43* 40 42*  ALT 51* 53* 59*  ALKPHOS 67 59 61  BILITOT 0.7 0.7 0.6  PROT 6.3* 6.0* 6.1*  ALBUMIN 3.5 3.2* 3.2*    No results for input(s): LIPASE, AMYLASE in the last 168 hours. No results for input(s): AMMONIA in the last 168 hours.  Coagulation Profile: No results for input(s): INR, PROTIME in the last 168 hours.  Cardiac Enzymes: No results for input(s): CKTOTAL, CKMB, CKMBINDEX, TROPONINI in the last 168 hours.  BNP (last 3 results) No results for input(s): PROBNP in the last 8760 hours.  Lipid Profile: No results for input(s): CHOL, HDL, LDLCALC, TRIG, CHOLHDL, LDLDIRECT in the last 72 hours.  Thyroid Function Tests: Recent Labs    11/10/21 0341  TSH 0.096*     Anemia Panel: No results for input(s): VITAMINB12, FOLATE,  FERRITIN, TIBC, IRON, RETICCTPCT in the last 72 hours.  Urine analysis:    Component Value Date/Time   COLORURINE STRAW (A) 07/23/2017 1839   APPEARANCEUR CLEAR 07/23/2017 1839   LABSPEC 1.012 07/23/2017 1839   PHURINE 5.0 07/23/2017 1839   GLUCOSEU NEGATIVE 07/23/2017 1839   HGBUR MODERATE (A) 07/23/2017 1839   BILIRUBINUR NEGATIVE 07/23/2017 1839   KETONESUR NEGATIVE 07/23/2017 1839   PROTEINUR NEGATIVE 07/23/2017 1839   UROBILINOGEN 0.2 06/23/2015 1110   NITRITE NEGATIVE 07/23/2017 1839   LEUKOCYTESUR TRACE (A) 07/23/2017 1839    Sepsis Labs: Lactic Acid, Venous    Component Value Date/Time   LATICACIDVEN 1.47 06/23/2015 0726    MICROBIOLOGY: Recent Results (from the past 240 hour(s))  Resp Panel by RT-PCR (Flu A&B, Covid) Nasopharyngeal Swab     Status: Abnormal   Collection Time: 11/09/21 12:19 PM   Specimen: Nasopharyngeal Swab; Nasopharyngeal(NP) swabs in vial transport medium  Result Value Ref Range Status   SARS Coronavirus 2 by RT PCR POSITIVE (A) NEGATIVE Final    Comment: RESULT CALLED TO, READ BACK BY AND VERIFIED WITHRicky Ala RN 1610 11/09/21 A BROWNING (NOTE) SARS-CoV-2 target nucleic acids are DETECTED.  The SARS-CoV-2 RNA is generally detectable in upper respiratory specimens during the acute phase of infection. Positive results are indicative of the presence of the identified virus, but do not rule out bacterial infection or co-infection with other pathogens not detected by the test. Clinical correlation with patient history and other diagnostic information is necessary to determine patient infection status. The expected result is Negative.  Fact Sheet for Patients: EntrepreneurPulse.com.au  Fact Sheet for Healthcare Providers: IncredibleEmployment.be  This test is not yet approved or cleared by the [place] FDA and  has been authorized for detection and/or diagnosis of SARS-CoV-2 by FDA under an Emergency  Use Authorization (EUA).  This EUA will remain in effect (meaning this test can b e used) for the duration of  the COVID-19  declaration under Section 564(b)(1) of the Act, 21 U.S.C. section 360bbb-3(b)(1), unless the authorization is terminated or revoked sooner.     Influenza A by PCR NEGATIVE NEGATIVE Final   Influenza B by PCR NEGATIVE NEGATIVE Final    Comment: (NOTE) The Xpert Xpress SARS-CoV-2/FLU/RSV plus assay is intended as an aid in the diagnosis of influenza from Nasopharyngeal swab specimens and should not be used as a sole basis for treatment. Nasal washings and aspirates are unacceptable for Xpert Xpress SARS-CoV-2/FLU/RSV testing.  Fact Sheet for Patients: EntrepreneurPulse.com.au  Fact Sheet for Healthcare Providers: IncredibleEmployment.be  This test is not yet approved or cleared by the [place] FDA and has been authorized for detection and/or diagnosis of SARS-CoV-2 by FDA under an Emergency Use Authorization (EUA). This EUA will remain in effect (meaning this test can be used) for the duration of the COVID-19 declaration under Section 564(b)(1) of the Act, 21 U.S.C. section 360bbb-3(b)(1), unless the authorization is terminated or revoked.  Performed at [facility], [place] [address] [place], [place] [address]     RADIOLOGY STUDIES/RESULTS: DG Chest 2 View  Result Date: 11/09/2021 CLINICAL DATA:  Shortness of breath, cough EXAM: CHEST - 2 VIEW COMPARISON:  Previous studies including the CT chest done on 08/17/2021 FINDINGS: Cardiac size is within normal limits. Low position of diaphragms suggests COPD. Thoracic aorta is tortuous and ectatic. There are no signs of alveolar pulmonary edema. There is prominence of interstitial markings in the right parahilar region and both lower lung fields which was not evident in the chest radiograph done on 07/23/2017. There is complete clearing of infiltrate in the right apical  region since 07/23/2017. IMPRESSION: COPD. There are no signs of alveolar pulmonary edema. There is no focal pulmonary consolidation. There is prominence of interstitial markings in the right parahilar region and both lower lung fields suggesting scarring. Possibility of superimposed interstitial pneumonia is not excluded. There is no pleural effusion or pneumothorax. Electronically Signed   By: Elmer Picker M.D.   On: 11/09/2021 12:48   CT CHEST WO CONTRAST  Result Date: 11/10/2021 CLINICAL DATA:  Recent COVID infection 11/07/2021 cough and shortness of breath, fever EXAM: CT CHEST WITHOUT CONTRAST TECHNIQUE: Multidetector CT imaging of the chest was performed following the standard protocol without IV contrast. COMPARISON:  08/17/2020 FINDINGS: Cardiovascular: Limited without IV contrast. Calcific atherosclerosis of the aorta and major branch vessels. Stable tortuosity and mild aneurysmal dilatation of the aorta, maximal diameter descending aorta 4 cm. No mediastinal hemorrhage or hematoma. Normal heart size. Native coronary atherosclerosis. Aortic valve calcifications as well. No pericardial effusion. Mediastinum/Nodes: Thyroid appears atrophic. No bulky adenopathy. Calcified mediastinal and right hilar lymph nodes from remote granulomatous disease. Trachea and central airways are patent. Esophagus nondilated. No hiatal hernia. Lungs/Pleura: Background progressive mixed centrilobular and paraseptal emphysema. Stable subcentimeter calcified granuloma in the right upper lobe. Scarring and present bilaterally. Stable 6 mm nodule in the right upper lobe, image 44/4. Stable peripheral calcified granuloma in the lingula, image 101/4. Stable posterior subpleural left lower lobe 7 mm nodule, image 130/4. No new or enlarging pulmonary nodule. Increased mild peripheral patchy ground-glass attenuation in the lower lobes well as the inferior right middle lobe. This is nonspecific for pneumonitis but can be seen  with viral infection/COVID. No pleural abnormality, effusion, or pneumothorax. Upper Abdomen: No acute upper abdominal finding. Abdominal aortic atherosclerosis. Exophytic upper pole left renal cyst as before. Musculoskeletal: Degenerative changes of the spine. Intact chest wall. No soft tissue asymmetry. No  acute osseous finding. Intact sternum. No compression fracture. IMPRESSION: Slight progression of the pulmonary emphysema pattern with bilateral lower lobe peripheral patchy ground-glass mild opacities suggesting mild pneumonitis/viral pneumonia. Stable calcified and noncalcified pulmonary nodules as detailed above. Aortic and native coronary atherosclerosis. Aortic Atherosclerosis (ICD10-I70.0) and Emphysema (ICD10-J43.9). Electronically Signed   By: Jerilynn Mages.  Shick M.D.   On: 11/10/2021 11:24   DG Chest Port 1V same Day  Result Date: 11/10/2021 CLINICAL DATA:  Shortness of breath. EXAM: PORTABLE CHEST 1 VIEW COMPARISON:  Chest CT 08/17/2020.  Chest x-ray 07/23/2017 FINDINGS: 0726 hours. Lungs are hyperexpanded. Interstitial markings are diffusely coarsened with chronic features. There is peripheral more prominent interstitial and possible airspace opacity in both mid lungs in the left base. This appears progressive since prior chest x-ray and CT of 08/17/2020. The cardiopericardial silhouette is within normal limits for size. Bones are diffusely demineralized. Telemetry leads overlie the chest. IMPRESSION: 1. Emphysema with chronic interstitial lung disease. 2. Progressive peripheral interstitial and airspace disease in the mid lungs bilaterally and left base. Atypical/viral infection could have this appearance. Electronically Signed   By: Misty Stanley M.D.   On: 11/10/2021 07:45   ECHOCARDIOGRAM COMPLETE  Result Date: 11/10/2021    ECHOCARDIOGRAM REPORT   Patient Name:   David [name] Date of Exam: 11/10/2021 Medical Rec #:  [id number]          Height:       72.0 in Accession #:    [id number]          Weight:       175.0 lb Date of Birth:  [birth year]         BSA:          2.013 m Patient Age:    63 years           BP:           160/69 mmHg Patient Gender: M                  HR:           65 bpm. Exam Location:  Inpatient Procedure: 2D Echo, Cardiac Doppler and Color Doppler Indications:    Respiratory distress  History:        Patient has prior history of Echocardiogram examinations, most                 recent 05/19/2021. COPD, Arrythmias:PVC and PAC,                 Signs/Symptoms:Murmur; Risk Factors:Hypertension and                 Dyslipidemia. CV 19 positive.  Sonographer:    Merrie Roof RDCS Referring Phys: [place]  1. Left ventricular ejection fraction, by estimation, is 60 to 65%. The left ventricle has normal function. The left ventricle has no regional wall motion abnormalities. Left ventricular diastolic function could not be evaluated.  2. Right ventricular systolic function is normal. The right ventricular size is normal.  3. Left atrial size was severely dilated.  4. Right atrial size was moderately dilated.  5. The mitral valve is normal in structure. No evidence of mitral valve regurgitation. No evidence of mitral stenosis.  6. The aortic valve is normal in structure. There is mild calcification of the aortic valve. There is mild thickening of the aortic valve. Aortic valve regurgitation is not visualized. Mild aortic valve stenosis. Aortic valve area, by VTI measures 1.86 cm.  Aortic valve mean gradient measures 11.0 mmHg. Aortic valve Vmax measures 2.25 m/s.  7. The inferior vena cava is dilated in size with >50% respiratory variability, suggesting right atrial pressure of 8 mmHg. FINDINGS  Left Ventricle: Left ventricular ejection fraction, by estimation, is 60 to 65%. The left ventricle has normal function. The left ventricle has no regional wall motion abnormalities. The left ventricular internal cavity size was normal in size. There is  no left ventricular  hypertrophy. Left ventricular diastolic function could not be evaluated. Right Ventricle: The right ventricular size is normal. No increase in right ventricular wall thickness. Right ventricular systolic function is normal. Left Atrium: Left atrial size was severely dilated. Right Atrium: Right atrial size was moderately dilated. Pericardium: There is no evidence of pericardial effusion. Mitral Valve: The mitral valve is normal in structure. No evidence of mitral valve regurgitation. No evidence of mitral valve stenosis. Tricuspid Valve: The tricuspid valve is normal in structure. Tricuspid valve regurgitation is trivial. No evidence of tricuspid stenosis. Aortic Valve: The aortic valve is normal in structure. There is mild calcification of the aortic valve. There is mild thickening of the aortic valve. Aortic valve regurgitation is not visualized. Mild aortic stenosis is present. Aortic valve mean gradient measures 11.0 mmHg. Aortic valve peak gradient measures 20.2 mmHg. Aortic valve area, by VTI measures 1.86 cm. Pulmonic Valve: The pulmonic valve was normal in structure. Pulmonic valve regurgitation is not visualized. No evidence of pulmonic stenosis. Aorta: The aortic root is normal in size and structure. Venous: The inferior vena cava is dilated in size with greater than 50% respiratory variability, suggesting right atrial pressure of 8 mmHg. IAS/Shunts: No atrial level shunt detected by color flow Doppler.  LEFT VENTRICLE PLAX 2D LVOT diam:     2.10 cm LV SV:         80 LV SV Index:   40 LVOT Area:     3.46 cm  IVC IVC diam: 2.40 cm LEFT ATRIUM              Index        RIGHT ATRIUM           Index LA Vol (A2C):   75.1 ml  37.31 ml/m  RA Area:     22.90 cm LA Vol (A4C):   112.0 ml 55.64 ml/m  RA Volume:   71.80 ml  35.67 ml/m LA Biplane Vol: 100.0 ml 49.68 ml/m  AORTIC VALVE AV Area (Vmax):    1.85 cm AV Area (Vmean):   1.84 cm AV Area (VTI):     1.86 cm AV Vmax:           225.00 cm/s AV Vmean:           150.000 cm/s AV VTI:            0.432 m AV Peak Grad:      20.2 mmHg AV Mean Grad:      11.0 mmHg LVOT Vmax:         120.00 cm/s LVOT Vmean:        79.600 cm/s LVOT VTI:          0.232 m LVOT/AV VTI ratio: 0.54  SHUNTS Systemic VTI:  0.23 m Systemic Diam: 2.10 cm Skeet Latch MD Electronically signed by Skeet Latch MD Signature Date/Time: 11/10/2021/6:25:52 PM    Final      LOS: 2 days   Oren Binet, MD  [facility]    To contact the attending  provider between 7A-7P or the covering provider during after hours 7P-7A, please log into the web site [email or web address] and access using universal [place] password for that web site. If you do not have the password, please call the hospital operator.  11/11/2021, 10:54 AM

## 2021-11-11 NOTE — Evaluation (Signed)
Occupational Therapy Evaluation Patient Details Name: David [name] MRN: [id number] DOB: October 17, 1931 Today's Date: 11/11/2021   History of Present Illness The pt is an 87 yo male presenting 11/17 with SOB and SPO2 in 80s. PMH includes: AAA, CKD III, COPD, DM II, HLD, HTN, CVA, and recent COVID (pos test on 11/5).   Clinical Impression   PTA, pt lives with spouse and reports complete Independence in all daily tasks. Pt typically active and enjoys working outside. Pt presents now with deficits in strength, dynamic standing balance and cardiopulmonary tolerance. Pt aware of deficits and demo good safety awareness in need for DME to complete mobility safely at this time. Pt requires Setup for UB ADLs, Supervision for LB ADLs and min guard for mobility using RW. Educated re: energy conservation strategies (handout provided), gradual progression of endurance, frequent OOB activity and incentive spirometer use (coordinated with nursing for provision of device). SpO2 desats to 83% on RA with 3/4 DOE during activity with pt able to recover to 90-91% on RA with seated rest break and pursed lip breathing. Recommend HHOT follow-up at DC to progress endurance/independence with ADLs/IADLs in home environment. Will continue to follow acutely       Recommendations for follow up therapy are one component of a multi-disciplinary discharge planning process, led by the attending physician.  Recommendations may be updated based on patient status, additional functional criteria and insurance authorization.   Follow Up Recommendations  Home health OT    Assistance Recommended at Discharge Intermittent Supervision/Assistance  Functional Status Assessment  Patient has had a recent decline in their functional status and demonstrates the ability to make significant improvements in function in a reasonable and predictable amount of time.  Equipment Recommendations  None recommended by OT    Recommendations for  Other Services       Precautions / Restrictions Precautions Precautions: Fall;Other (comment) Precaution Comments: monitor O2 Restrictions Weight Bearing Restrictions: No      Mobility Bed Mobility Overal bed mobility: Modified Independent Bed Mobility: Supine to Sit     Supine to sit: Modified independent (Device/Increase time)     General bed mobility comments: HOB elevated, use of bedrails    Transfers Overall transfer level: Needs assistance Equipment used: None Transfers: Sit to/from Stand Sit to Stand: Supervision           General transfer comment: able to stand without AD though noted shakiness. Provided RW for support during mobility      Balance Overall balance assessment: Needs assistance Sitting-balance support: No upper extremity supported;Feet supported Sitting balance-Leahy Scale: Good     Standing balance support: Bilateral upper extremity supported;During functional activity Standing balance-Leahy Scale: Fair Standing balance comment: fair static standing without AD though noted shakiness, BUE support for mobility                           ADL either performed or assessed with clinical judgement   ADL Overall ADL's : Needs assistance/impaired Eating/Feeding: Independent;Sitting   Grooming: Set up;Standing   Upper Body Bathing: Set up;Sitting   Lower Body Bathing: Sit to/from stand;Supervison/ safety   Upper Body Dressing : Set up;Sitting   Lower Body Dressing: Sit to/from stand;Supervision/safety   Toilet Transfer: Min guard;Ambulation;Rolling walker (2 wheels)   Toileting- Clothing Manipulation and Hygiene: Supervision/safety;Sit to/from stand       Functional mobility during ADLs: Min guard General ADL Comments: Pt with noted dyspnea with activity and talking, use of RW  for mobility due to shakiness in standing with pt aware of weakness. Provided energy conservation handout and education for ADLs with further  reinforcement needed. Emphasis on seated rest breaks and pursed lip breathing.     Vision Baseline Vision/History: 1 Wears glasses Ability to See in Adequate Light: 0 Adequate Patient Visual Report: No change from baseline Vision Assessment?: No apparent visual deficits     Perception     Praxis      Pertinent Vitals/Pain Pain Assessment: No/denies pain     Hand Dominance Left   Extremity/Trunk Assessment Upper Extremity Assessment Upper Extremity Assessment: Generalized weakness   Lower Extremity Assessment Lower Extremity Assessment: Defer to PT evaluation   Cervical / Trunk Assessment Cervical / Trunk Assessment: Normal   Communication Communication Communication: No difficulties   Cognition Arousal/Alertness: Awake/alert Behavior During Therapy: WFL for tasks assessed/performed Overall Cognitive Status: Within Functional Limits for tasks assessed                                 General Comments: very pleasant     General Comments  HR 70s-80s with activity, SpO2 95% on 2 L O2 at rest, 93% on RA at rest, desats to 83% on RA with activity and 3/4 DOE noted. 1.5 min seated rest break and cues for pursed lip breathing to recover to 90-91% on RA. Encouraged use of IS and coordinated for obtaining device with RT in room at end of session to educate on its use. Noted R peripheral IV out on entry, removed from tape on UE - notified RN    Exercises     Shoulder Instructions      Home Living Family/patient expects to be discharged to:: Private residence Living Arrangements: Spouse/significant other Available Help at Discharge: Family;Available 24 hours/day Type of Home: House Home Access: Stairs to enter [employer] of Steps: 2-3 Entrance Stairs-Rails: Right Home Layout: One level     Bathroom Shower/Tub: Occupational psychologist: Handicapped height Bathroom Accessibility: Yes   Home Equipment: Shower seat;Grab bars -  tub/shower;Grab bars - toilet;Rolling Walker (2 wheels);Cane - single point;Hand held shower head          Prior Functioning/Environment Prior Level of Function : Independent/Modified Independent;Driving             Mobility Comments: no use of AD, no falls ADLs Comments: Driving, enjoys yard work and being outside. active at baseline, had been doing light exercises at gym until heart issues a few months ago        OT Problem List: Decreased strength;Decreased activity tolerance;Impaired balance (sitting and/or standing);Cardiopulmonary status limiting activity      OT Treatment/Interventions: Self-care/ADL training;Therapeutic exercise;Energy conservation;DME and/or AE instruction;Therapeutic activities;Balance training;Patient/family education    OT Goals(Current goals can be found in the care plan section) Acute Rehab OT Goals Patient Stated Goal: improve strength, return to normal activities OT Goal Formulation: With patient Time For Goal Achievement: 11/25/21 Potential to Achieve Goals: Good ADL Goals Pt Will Perform Lower Body Dressing: Independently;sit to/from stand Pt Will Transfer to Toilet: with modified independence;ambulating Pt/caregiver will Perform Home Exercise Program: Increased strength;Both right and left upper extremity;With theraband;Independently;With written HEP provided Additional ADL Goal #1: Pt to verbalize at least 3 energy conservation strategies to implement during ADLs/IADLs Additional ADL Goal #2: Pt to increase standing activity tolerance > 7 min during ADLs/IADLs to maximize endurance  OT Frequency: Min 2X/week   Barriers  to D/C:            Co-evaluation              AM-PAC OT "6 Clicks" Daily Activity     Outcome Measure Help from another person eating meals?: None Help from another person taking care of personal grooming?: A Little Help from another person toileting, which includes using toliet, bedpan, or urinal?: A  Little Help from another person bathing (including washing, rinsing, drying)?: A Little Help from another person to put on and taking off regular upper body clothing?: A Little Help from another person to put on and taking off regular lower body clothing?: A Little 6 Click Score: 19   End of Session Equipment Utilized During Treatment: Rolling walker (2 wheels) Nurse Communication: Mobility status;Other (comment) (O2, IS, IV)  Activity Tolerance: Patient tolerated treatment well Patient left: in chair;with call bell/phone within reach;Other (comment) (with RT in room)  OT Visit Diagnosis: Unsteadiness on feet (R26.81);Other abnormalities of gait and mobility (R26.89);Muscle weakness (generalized) (M62.81)                Time: 3646-8032 OT Time Calculation (min): 27 min Charges:  OT General Charges $OT Visit: 1 Visit OT Evaluation $OT Eval Moderate Complexity: 1 Mod OT Treatments $Self Care/Home Management : 8-22 mins  Malachy Chamber, OTR/L Acute Rehab Services Office: [phone]   Layla Maw 11/11/2021, 7:51 AM

## 2021-11-12 ENCOUNTER — Inpatient Hospital Stay (HOSPITAL_COMMUNITY): Payer: Medicare Other

## 2021-11-12 DIAGNOSIS — J9601 Acute respiratory failure with hypoxia: Secondary | ICD-10-CM

## 2021-11-12 LAB — COMPREHENSIVE METABOLIC PANEL
ALT: 50 U/L — ABNORMAL HIGH (ref 0–44)
AST: 31 U/L (ref 15–41)
Albumin: 3 g/dL — ABNORMAL LOW (ref 3.5–5.0)
Alkaline Phosphatase: 58 U/L (ref 38–126)
Anion gap: 8 (ref 5–15)
BUN: 64 mg/dL — ABNORMAL HIGH (ref 8–23)
CO2: 23 mmol/L (ref 22–32)
Calcium: 8.8 mg/dL — ABNORMAL LOW (ref 8.9–10.3)
Chloride: 106 mmol/L (ref 98–111)
Creatinine, Ser: 1.83 mg/dL — ABNORMAL HIGH (ref 0.61–1.24)
GFR, Estimated: 35 mL/min — ABNORMAL LOW (ref >60)
Glucose, Bld: 221 mg/dL — ABNORMAL HIGH (ref 70–99)
Potassium: 4.4 mmol/L (ref 3.5–5.1)
Sodium: 137 mmol/L (ref 135–145)
Total Bilirubin: 0.7 mg/dL (ref 0.3–1.2)
Total Protein: 5.7 g/dL — ABNORMAL LOW (ref 6.5–8.1)

## 2021-11-12 LAB — CBC
HCT: 32.2 % — ABNORMAL LOW (ref 39.0–52.0)
Hemoglobin: 11.4 g/dL — ABNORMAL LOW (ref 13.0–17.0)
MCH: 34 pg (ref 26.0–34.0)
MCHC: 35.4 g/dL (ref 30.0–36.0)
MCV: 96.1 fL (ref 80.0–100.0)
Platelets: 204 10*3/uL (ref 150–400)
RBC: 3.35 MIL/uL — ABNORMAL LOW (ref 4.22–5.81)
RDW: 13.2 % (ref 11.5–15.5)
WBC: 12.9 10*3/uL — ABNORMAL HIGH (ref 4.0–10.5)
nRBC: 0 % (ref 0.0–0.2)

## 2021-11-12 LAB — TSH: TSH: 0.058 u[IU]/mL — ABNORMAL LOW (ref 0.350–4.500)

## 2021-11-12 LAB — GLUCOSE, CAPILLARY
Glucose-Capillary: 227 mg/dL — ABNORMAL HIGH (ref 70–99)
Glucose-Capillary: 213 mg/dL — ABNORMAL HIGH (ref 70–99)
Glucose-Capillary: 193 mg/dL — ABNORMAL HIGH (ref 70–99)
Glucose-Capillary: 238 mg/dL — ABNORMAL HIGH (ref 70–99)

## 2021-11-12 LAB — T4, FREE: Free T4: 1.32 ng/dL — ABNORMAL HIGH (ref 0.61–1.12)

## 2021-11-12 LAB — PROCALCITONIN: Procalcitonin: <0.1 ng/mL

## 2021-11-12 LAB — BRAIN NATRIURETIC PEPTIDE: B Natriuretic Peptide: 221.4 pg/mL — ABNORMAL HIGH (ref 0.0–100.0)

## 2021-11-12 LAB — C-REACTIVE PROTEIN: CRP: 1.9 mg/dL — ABNORMAL HIGH (ref <1.0)

## 2021-11-12 NOTE — Plan of Care (Signed)
  Problem: Clinical Measurements: Goal: Ability to maintain clinical measurements within normal limits will improve Outcome: Progressing Goal: Will remain free from infection Outcome: Progressing Goal: Respiratory complications will improve Outcome: Progressing   

## 2021-11-12 NOTE — Plan of Care (Signed)
  Problem: Clinical Measurements: Goal: Ability to maintain clinical measurements within normal limits will improve Outcome: Progressing Goal: Respiratory complications will improve Outcome: Progressing   Problem: Activity: Goal: Risk for activity intolerance will decrease Outcome: Progressing   Problem: Nutrition: Goal: Adequate nutrition will be maintained Outcome: Progressing

## 2021-11-12 NOTE — Progress Notes (Signed)
PROGRESS NOTE        PATIENT DETAILS Name: David [name] Age: 87 y.o. Sex: male Date of Birth: 1931-11-24 Admit Date: 11/09/2021 Admitting Physician Norval Morton, MD MLY:[id number], Elta Guadeloupe, MD  Brief Narrative: Patient is a 87 y.o. male with history of TIA, HTN, HLD, COPD, CKD stage IIIb, recent COVID infection on 11/15-presenting with 1 week history of cough, shortness of breath and intermittent fever (PCP had started cefdinir prior to admission).  He was found to have acute hypoxic respiratory failure due to presumed pneumonia.  See below for further details.  Pertinent Labs/Radiology:  11/18>> blood culture: Pending  11/17>>CXR: Prominent interstitial markings in the right perihilar region-no focal consolidation 11/18>> CXR: Progressive peripheral interstitial/airspace disease bilaterally. 11/18>> CT chest: Bilateral/peripheral opacities-suggesting mild pneumonitis/viral PNA. 11/18>> Echo: EF 60-65%   Subjective: Patient in bed, appears comfortable, denies any headache, no fever, no chest pain or pressure, improved shortness of breath , no abdominal pain. No new focal weakness.   Objective: Vitals: Blood pressure (!) 165/72, pulse 79, temperature 98.5 F (36.9 C), resp. rate 19, height 6' (1.829 m), weight 79 kg, SpO2 92 %.   Exam:  Awake Alert, No new F.N deficits, Normal affect [place].AT,PERRAL Supple Neck, No JVD,   Symmetrical Chest wall movement, Good air movement bilaterally, CTAB RRR,No Gallops, Rubs or new Murmurs,  +ve B.Sounds, Abd Soft, No tenderness,   No Cyanosis, Clubbing or edema     Assessment/Plan:  Acute hypoxic respiratory failure likely due to community-acquired pneumonia vs post COVID inflammatory pneumonitis: Improved-suspect this is mostly post COVID viral pneumonitis-CRP now downtrending-can narrow ABX to just Rocephin.  Continue Solu-Medrol.  Volume status is stable-does not require diuretics today has developed some  mild AKI.  Ambulate with PT/mobilize as much as possible.  Reviewed OT note-he does desaturate with ambulation-May need to assess home O2 requirement when we get closer to discharge.  Follow culture data.  COPD: Not in exacerbation-continue bronchodilators.  HFpEF: Not in exacerbation-volume status stable-hold further dosing of diuretics-given mild bump in creatinine.  Echo with preserved EF.  DM-2 (A1c 7.4 on 1/7) with uncontrolled hyperglycemia due to steroids: CBGs better but still elevated-increase Semglee to 16 units daily-continue 4 units of NovoLog with meals and SSI.  Reassess on 11/20.    Recent Labs    11/11/21 1644 11/11/21 2139 11/12/21 0643  GLUCAP 181* 95 227*   Lab Results  Component Value Date   TSH 0.096 (L) 11/10/2021    Hypothyroidism: TSH mildly suppressed could be sick euthyroid, repeat TSH along with free T4, may need to drop his home Synthroid dose.  History of TIA: No focal neurological deficits-continue Plavix/statin  HLD: Continue statin  AKI CKD stage IIIb: Baseline creatinine around 1.8, had gotten dehydrated much improved and close to baseline, continue to monitor.  History of PVCs/PACs: Continue telemetry monitoring-on Cardizem.  HTN: BP stable-on Lasix/Cardizem continue to hold valsartan/HCTZ for now.  Debility: Obtain PT/OT.  BMI Estimated body mass index is 23.62 kg/m as calculated from the following:   Height as of this encounter: 6' (1.829 m).   Weight as of this encounter: 79 kg.    Procedures: None Consults: None DVT Prophylaxis: SQ heparin Code Status:Full code  Family Communication: Son-Dale-[phone] updated over the phone on 11/19-he will update other family members.  Time spent: 35 minutes-Greater than 50% of this time was  spent in counseling, explanation of diagnosis, planning of further management, and coordination of care.   Disposition Plan: Status is: Inpatient  Remains inpatient appropriate because: Hypoxia due  to PNA/post-COVID pneumonitis.   Diet: Diet Order             Diet Carb Modified Fluid consistency: Thin; Room service appropriate? Yes  Diet effective now                     Antimicrobial agents: Anti-infectives (From admission, onward)    Start     Dose/Rate Route Frequency Ordered Stop   11/11/21 1000  cefTRIAXone (ROCEPHIN) 2 g in sodium chloride 0.9 % 100 mL IVPB        2 g 200 mL/hr over 30 Minutes Intravenous Every 24 hours 11/11/21 0845 11/14/21 0959   11/09/21 2200  ceFEPIme (MAXIPIME) 2 g in sodium chloride 0.9 % 100 mL IVPB  Status:  Discontinued        2 g 200 mL/hr over 30 Minutes Intravenous Every 12 hours 11/09/21 1452 11/11/21 0845   11/09/21 1430  ceFEPIme (MAXIPIME) 2 g in sodium chloride 0.9 % 100 mL IVPB        2 g 200 mL/hr over 30 Minutes Intravenous  Once 11/09/21 1417 11/09/21 1540   11/09/21 1430  azithromycin (ZITHROMAX) 500 mg in sodium chloride 0.9 % 250 mL IVPB  Status:  Discontinued        500 mg 250 mL/hr over 60 Minutes Intravenous  Once 11/09/21 1420 11/09/21 1607        MEDICATIONS: Scheduled Meds:  allopurinol  150 mg Oral Daily   arformoterol  15 mcg Nebulization BID   budesonide (PULMICORT) nebulizer solution  0.25 mg Nebulization BID   clopidogrel  75 mg Oral QHS   diltiazem  120 mg Oral Daily   guaiFENesin  600 mg Oral BID   heparin  5,000 Units Subcutaneous Q8H   insulin aspart  0-15 Units Subcutaneous TID WC   insulin aspart  4 Units Subcutaneous TID WC   insulin glargine-yfgn  16 Units Subcutaneous Daily   ipratropium-albuterol  3 mL Nebulization TID   levothyroxine  125 mcg Oral Once per day on Mon Tue Wed Thu Fri Sat   levothyroxine  62.5 mcg Oral Q Sun   loratadine  10 mg Oral Daily   methylPREDNISolone (SOLU-MEDROL) injection  40 mg Intravenous Q24H   mirtazapine  15 mg Oral QHS   rosuvastatin  10 mg Oral QHS   Continuous Infusions:  cefTRIAXone (ROCEPHIN)  IV 200 mL/hr at 11/12/21 0906   PRN  Meds:.acetaminophen **OR** acetaminophen, LORazepam   I have personally reviewed following labs and imaging studies  LABORATORY DATA:  Recent Labs  Lab 11/09/21 1219 11/10/21 0341 11/10/21 0430 11/11/21 0319 11/12/21 0420  WBC 11.6* 8.4  --  17.2* 12.9*  HGB 11.9* 11.6* 10.9* 11.7* 11.4*  HCT 35.2* 34.2* 32.0* 33.4* 32.2*  PLT 201 187  --  217 204  MCV 100.3* 98.8  --  96.3 96.1  MCH 33.9 33.5  --  33.7 34.0  MCHC 33.8 33.9  --  35.0 35.4  RDW 13.2 13.2  --  13.3 13.2  LYMPHSABS 1.0  --   --   --   --   MONOABS 0.9  --   --   --   --   EOSABS 0.0  --   --   --   --   BASOSABS 0.0  --   --   --   --  Recent Labs  Lab 11/09/21 1219 11/09/21 1419 11/10/21 0050 11/10/21 0341 11/10/21 0430 11/10/21 0946 11/11/21 0319 11/12/21 0420  NA 133*  --   --  137 138  --  138 137  K 3.7  --   --  4.1 4.1  --  3.9 4.4  CL 103  --   --  106  --   --  105 106  CO2 16*  --   --  19*  --   --  21* 23  GLUCOSE 184*  --   --  264*  --   --  247* 221*  BUN 43*  --   --  50*  --   --  65* 64*  CREATININE 1.85*  --   --  1.83*  --   --  2.12* 1.83*  CALCIUM 9.1  --   --  9.4  --   --  9.2 8.8*  AST 43*  --   --  40  --   --  42* 31  ALT 51*  --   --  53*  --   --  59* 50*  ALKPHOS 67  --   --  59  --   --  61 58  BILITOT 0.7  --   --  0.7  --   --  0.6 0.7  ALBUMIN 3.5  --   --  3.2*  --   --  3.2* 3.0*  MG  --   --   --  1.8  --   --   --   --   CRP  --   --   --   --   --  6.7* 3.6* 1.9*  PROCALCITON  --  <0.10  --   --   --   --  <0.10  --   TSH  --   --   --  0.096*  --   --   --   --   HGBA1C  --   --  6.7*  --   --   --   --   --   BNP 461.4*  --   --   --   --   --   --   --           RADIOLOGY STUDIES/RESULTS: CT CHEST WO CONTRAST  Result Date: 11/10/2021 CLINICAL DATA:  Recent COVID infection 11/07/2021 cough and shortness of breath, fever EXAM: CT CHEST WITHOUT CONTRAST TECHNIQUE: Multidetector CT imaging of the chest was performed following the standard  protocol without IV contrast. COMPARISON:  08/17/2020 FINDINGS: Cardiovascular: Limited without IV contrast. Calcific atherosclerosis of the aorta and major branch vessels. Stable tortuosity and mild aneurysmal dilatation of the aorta, maximal diameter descending aorta 4 cm. No mediastinal hemorrhage or hematoma. Normal heart size. Native coronary atherosclerosis. Aortic valve calcifications as well. No pericardial effusion. Mediastinum/Nodes: Thyroid appears atrophic. No bulky adenopathy. Calcified mediastinal and right hilar lymph nodes from remote granulomatous disease. Trachea and central airways are patent. Esophagus nondilated. No hiatal hernia. Lungs/Pleura: Background progressive mixed centrilobular and paraseptal emphysema. Stable subcentimeter calcified granuloma in the right upper lobe. Scarring and present bilaterally. Stable 6 mm nodule in the right upper lobe, image 44/4. Stable peripheral calcified granuloma in the lingula, image 101/4. Stable posterior subpleural left lower lobe 7 mm nodule, image 130/4. No new or enlarging pulmonary nodule. Increased mild peripheral patchy ground-glass attenuation in the lower lobes well as the inferior right middle lobe. This is  nonspecific for pneumonitis but can be seen with viral infection/COVID. No pleural abnormality, effusion, or pneumothorax. Upper Abdomen: No acute upper abdominal finding. Abdominal aortic atherosclerosis. Exophytic upper pole left renal cyst as before. Musculoskeletal: Degenerative changes of the spine. Intact chest wall. No soft tissue asymmetry. No acute osseous finding. Intact sternum. No compression fracture. IMPRESSION: Slight progression of the pulmonary emphysema pattern with bilateral lower lobe peripheral patchy ground-glass mild opacities suggesting mild pneumonitis/viral pneumonia. Stable calcified and noncalcified pulmonary nodules as detailed above. Aortic and native coronary atherosclerosis. Aortic Atherosclerosis  (ICD10-I70.0) and Emphysema (ICD10-J43.9). Electronically Signed   By: Jerilynn Mages.  Shick M.D.   On: 11/10/2021 11:24   DG Chest Port 1 View  Result Date: 11/12/2021 CLINICAL DATA:  87 year old male with shortness of breath. Positive COVID-19. EXAM: PORTABLE CHEST 1 VIEW COMPARISON:  Chest CT 11/10/2021 and earlier. FINDINGS: Portable AP semi upright view at 0759 hours. Large lung volumes. Emphysema on CT. Vague, peripheral increased bilateral pulmonary opacity is stable from the the portable chest 2 days ago. No pneumothorax or pleural effusion. Stable cardiac size and mediastinal contours. Visualized tracheal air column is within normal limits. No acute osseous abnormality identified. Small surgical clips at the thoracic inlet from thyroidectomy. IMPRESSION: Emphysema with stable bilateral peripheral vague pulmonary opacity compatible with COVID-19 pneumonia. Electronically Signed   By: Genevie Ann M.D.   On: 11/12/2021 08:25   ECHOCARDIOGRAM COMPLETE  Result Date: 11/10/2021    ECHOCARDIOGRAM REPORT   Patient Name:   David [name] Date of Exam: 11/10/2021 Medical Rec #:  [id number]          Height:       72.0 in Accession #:    [id number]         Weight:       175.0 lb Date of Birth:  [birth year]         BSA:          2.013 m Patient Age:    39 years           BP:           160/69 mmHg Patient Gender: M                  HR:           65 bpm. Exam Location:  Inpatient Procedure: 2D Echo, Cardiac Doppler and Color Doppler Indications:    Respiratory distress  History:        Patient has prior history of Echocardiogram examinations, most                 recent 05/19/2021. COPD, Arrythmias:PVC and PAC,                 Signs/Symptoms:Murmur; Risk Factors:Hypertension and                 Dyslipidemia. CV 19 positive.  Sonographer:    Merrie Roof RDCS Referring Phys: [place]  1. Left ventricular ejection fraction, by estimation, is 60 to 65%. The left ventricle has normal function. The left  ventricle has no regional wall motion abnormalities. Left ventricular diastolic function could not be evaluated.  2. Right ventricular systolic function is normal. The right ventricular size is normal.  3. Left atrial size was severely dilated.  4. Right atrial size was moderately dilated.  5. The mitral valve is normal in structure. No evidence of mitral valve regurgitation. No evidence of mitral stenosis.  6. The aortic valve  is normal in structure. There is mild calcification of the aortic valve. There is mild thickening of the aortic valve. Aortic valve regurgitation is not visualized. Mild aortic valve stenosis. Aortic valve area, by VTI measures 1.86 cm. Aortic valve mean gradient measures 11.0 mmHg. Aortic valve Vmax measures 2.25 m/s.  7. The inferior vena cava is dilated in size with >50% respiratory variability, suggesting right atrial pressure of 8 mmHg. FINDINGS  Left Ventricle: Left ventricular ejection fraction, by estimation, is 60 to 65%. The left ventricle has normal function. The left ventricle has no regional wall motion abnormalities. The left ventricular internal cavity size was normal in size. There is  no left ventricular hypertrophy. Left ventricular diastolic function could not be evaluated. Right Ventricle: The right ventricular size is normal. No increase in right ventricular wall thickness. Right ventricular systolic function is normal. Left Atrium: Left atrial size was severely dilated. Right Atrium: Right atrial size was moderately dilated. Pericardium: There is no evidence of pericardial effusion. Mitral Valve: The mitral valve is normal in structure. No evidence of mitral valve regurgitation. No evidence of mitral valve stenosis. Tricuspid Valve: The tricuspid valve is normal in structure. Tricuspid valve regurgitation is trivial. No evidence of tricuspid stenosis. Aortic Valve: The aortic valve is normal in structure. There is mild calcification of the aortic valve. There is mild  thickening of the aortic valve. Aortic valve regurgitation is not visualized. Mild aortic stenosis is present. Aortic valve mean gradient measures 11.0 mmHg. Aortic valve peak gradient measures 20.2 mmHg. Aortic valve area, by VTI measures 1.86 cm. Pulmonic Valve: The pulmonic valve was normal in structure. Pulmonic valve regurgitation is not visualized. No evidence of pulmonic stenosis. Aorta: The aortic root is normal in size and structure. Venous: The inferior vena cava is dilated in size with greater than 50% respiratory variability, suggesting right atrial pressure of 8 mmHg. IAS/Shunts: No atrial level shunt detected by color flow Doppler.  LEFT VENTRICLE PLAX 2D LVOT diam:     2.10 cm LV SV:         80 LV SV Index:   40 LVOT Area:     3.46 cm  IVC IVC diam: 2.40 cm LEFT ATRIUM              Index        RIGHT ATRIUM           Index LA Vol (A2C):   75.1 ml  37.31 ml/m  RA Area:     22.90 cm LA Vol (A4C):   112.0 ml 55.64 ml/m  RA Volume:   71.80 ml  35.67 ml/m LA Biplane Vol: 100.0 ml 49.68 ml/m  AORTIC VALVE AV Area (Vmax):    1.85 cm AV Area (Vmean):   1.84 cm AV Area (VTI):     1.86 cm AV Vmax:           225.00 cm/s AV Vmean:          150.000 cm/s AV VTI:            0.432 m AV Peak Grad:      20.2 mmHg AV Mean Grad:      11.0 mmHg LVOT Vmax:         120.00 cm/s LVOT Vmean:        79.600 cm/s LVOT VTI:          0.232 m LVOT/AV VTI ratio: 0.54  SHUNTS Systemic VTI:  0.23 m Systemic Diam: 2.10 cm Skeet Latch MD  Electronically signed by Skeet Latch MD Signature Date/Time: 11/10/2021/6:25:52 PM    Final      LOS: 3 days   Signature  Lala Lund M.D on 11/12/2021 at 9:48 AM   -  To page go to [email or web address]

## 2021-11-13 LAB — COMPREHENSIVE METABOLIC PANEL
ALT: 56 U/L — ABNORMAL HIGH (ref 0–44)
AST: 37 U/L (ref 15–41)
Albumin: 2.9 g/dL — ABNORMAL LOW (ref 3.5–5.0)
Alkaline Phosphatase: 61 U/L (ref 38–126)
Anion gap: 9 (ref 5–15)
BUN: 59 mg/dL — ABNORMAL HIGH (ref 8–23)
CO2: 22 mmol/L (ref 22–32)
Calcium: 8.5 mg/dL — ABNORMAL LOW (ref 8.9–10.3)
Chloride: 106 mmol/L (ref 98–111)
Creatinine, Ser: 1.68 mg/dL — ABNORMAL HIGH (ref 0.61–1.24)
GFR, Estimated: 39 mL/min — ABNORMAL LOW (ref >60)
Glucose, Bld: 230 mg/dL — ABNORMAL HIGH (ref 70–99)
Potassium: 4.2 mmol/L (ref 3.5–5.1)
Sodium: 137 mmol/L (ref 135–145)
Total Bilirubin: 0.5 mg/dL (ref 0.3–1.2)
Total Protein: 5.5 g/dL — ABNORMAL LOW (ref 6.5–8.1)

## 2021-11-13 LAB — CBC WITH DIFFERENTIAL/PLATELET
Abs Immature Granulocytes: 0.23 10*3/uL — ABNORMAL HIGH (ref 0.00–0.07)
Basophils Absolute: 0 10*3/uL (ref 0.0–0.1)
Basophils Relative: 0 %
Eosinophils Absolute: 0 10*3/uL (ref 0.0–0.5)
Eosinophils Relative: 0 %
HCT: 32.2 % — ABNORMAL LOW (ref 39.0–52.0)
Hemoglobin: 11.1 g/dL — ABNORMAL LOW (ref 13.0–17.0)
Immature Granulocytes: 2 %
Lymphocytes Relative: 4 %
Lymphs Abs: 0.4 10*3/uL — ABNORMAL LOW (ref 0.7–4.0)
MCH: 33.6 pg (ref 26.0–34.0)
MCHC: 34.5 g/dL (ref 30.0–36.0)
MCV: 97.6 fL (ref 80.0–100.0)
Monocytes Absolute: 0.7 10*3/uL (ref 0.1–1.0)
Monocytes Relative: 7 %
Neutro Abs: 9.9 10*3/uL — ABNORMAL HIGH (ref 1.7–7.7)
Neutrophils Relative %: 87 %
Platelets: 192 10*3/uL (ref 150–400)
RBC: 3.3 MIL/uL — ABNORMAL LOW (ref 4.22–5.81)
RDW: 13.2 % (ref 11.5–15.5)
WBC: 11.3 10*3/uL — ABNORMAL HIGH (ref 4.0–10.5)
nRBC: 0 % (ref 0.0–0.2)

## 2021-11-13 LAB — MAGNESIUM: Magnesium: 2.3 mg/dL (ref 1.7–2.4)

## 2021-11-13 LAB — BRAIN NATRIURETIC PEPTIDE: B Natriuretic Peptide: 140.9 pg/mL — ABNORMAL HIGH (ref 0.0–100.0)

## 2021-11-13 LAB — PROCALCITONIN: Procalcitonin: <0.1 ng/mL

## 2021-11-13 LAB — GLUCOSE, CAPILLARY
Glucose-Capillary: 196 mg/dL — ABNORMAL HIGH (ref 70–99)
Glucose-Capillary: 97 mg/dL (ref 70–99)
Glucose-Capillary: 255 mg/dL — ABNORMAL HIGH (ref 70–99)
Glucose-Capillary: 145 mg/dL — ABNORMAL HIGH (ref 70–99)

## 2021-11-13 LAB — C-REACTIVE PROTEIN: CRP: 1.2 mg/dL — ABNORMAL HIGH (ref <1.0)

## 2021-11-13 NOTE — Progress Notes (Signed)
Patient ambulated in room on room air, patient desaturated to 84% with SOB.  Further exertion made saturation go down to 78%, placed back on 2L and O2 saturation went back up to 92-93%.  No distress, needs addressed.

## 2021-11-13 NOTE — Progress Notes (Signed)
PROGRESS NOTE        PATIENT DETAILS Name: David [name] Age: 87 y.o. Sex: male Date of Birth: 1931/02/27 Admit Date: 11/09/2021 Admitting Physician Norval Morton, MD EXB:[id number], Elta Guadeloupe, MD  Brief Narrative: Patient is a 87 y.o. male with history of TIA, HTN, HLD, COPD, CKD stage IIIb, recent COVID infection on 11/15-presenting with 1 week history of cough, shortness of breath and intermittent fever (PCP had started cefdinir prior to admission).  He was found to have acute hypoxic respiratory failure due to presumed pneumonia.  See below for further details.  Pertinent Labs/Radiology:  11/18>> blood culture: Pending  11/17>>CXR: Prominent interstitial markings in the right perihilar region-no focal consolidation 11/18>> CXR: Progressive peripheral interstitial/airspace disease bilaterally. 11/18>> CT chest: Bilateral/peripheral opacities-suggesting mild pneumonitis/viral PNA. 11/18>> Echo: EF 60-65%   Subjective:  Patient in bed, appears comfortable, denies any headache, no fever, no chest pain or pressure, no shortness of breath , no abdominal pain. No new focal weakness.  Objective: Vitals: Blood pressure (!) 150/72, pulse 81, temperature 97.9 F (36.6 C), temperature source Oral, resp. rate 18, height 6' (1.829 m), weight 79 kg, SpO2 95 %.   Exam:  Awake Alert, No new F.N deficits, Normal affect [place].AT,PERRAL Supple Neck, No JVD,   Symmetrical Chest wall movement, Good air movement bilaterally, CTAB RRR,No Gallops, Rubs or new Murmurs,  +ve B.Sounds, Abd Soft, No tenderness,   No Cyanosis, Clubbing or edema   Assessment/Plan:  Acute hypoxic respiratory failure likely due to community-acquired pneumonia vs post COVID inflammatory pneumonitis: Improved-suspect this is mostly post COVID viral pneumonitis-CRP now downtrending-can narrow ABX to just Rocephin.  Continue Solu-Medrol.  Volume status is stable-does not require diuretics today has  developed some mild AKI.  Ambulate with PT/mobilize as much as possible.  Advance activity likely discharge in the next 1 to 2 days has qualified for home oxygen which has been ordered  COPD: Not in exacerbation-continue bronchodilators.  HFpEF: Not in exacerbation-volume status stable-hold further dosing of diuretics-given mild bump in creatinine.  Echo with preserved EF.  DM-2 (A1c 7.4 on 1/7) with uncontrolled hyperglycemia due to steroids: CBGs better but still elevated-increase Semglee to 16 units daily-continue 4 units of NovoLog with meals and SSI.  Reassess on 11/20.    Recent Labs    11/12/21 1657 11/12/21 2255 11/13/21 0621  GLUCAP 193* 238* 196*   Lab Results  Component Value Date   TSH 0.058 (L) 11/12/2021    Hypothyroidism: TSH mildly suppressed could be sick euthyroid, repeat TSH along with free T4, may need to drop his home Synthroid dose.  History of TIA: No focal neurological deficits-continue Plavix/statin  HLD: Continue statin  AKI CKD stage IIIb: Baseline creatinine around 1.8, was dehydrated resolved after hydration.  History of PVCs/PACs: Continue telemetry monitoring-on Cardizem.  HTN: BP stable-on Lasix/Cardizem continue to hold valsartan/HCTZ for now.  Debility: Obtain PT/OT.  BMI Estimated body mass index is 23.62 kg/m as calculated from the following:   Height as of this encounter: 6' (1.829 m).   Weight as of this encounter: 79 kg.    Procedures: None Consults: None DVT Prophylaxis: SQ heparin Code Status:Full code  Family Communication: Son-Dale-[phone] updated over the phone on 11/19-he will update other family members.  Time spent: 35 minutes-Greater than 50% of this time was spent in counseling, explanation of diagnosis, planning of further  management, and coordination of care.   Disposition Plan: Status is: Inpatient  Remains inpatient appropriate because: Hypoxia due to PNA/post-COVID pneumonitis.   Diet: Diet Order              Diet Carb Modified Fluid consistency: Thin; Room service appropriate? Yes  Diet effective now                     Antimicrobial agents: Anti-infectives (From admission, onward)    Start     Dose/Rate Route Frequency Ordered Stop   11/11/21 1000  cefTRIAXone (ROCEPHIN) 2 g in sodium chloride 0.9 % 100 mL IVPB        2 g 200 mL/hr over 30 Minutes Intravenous Every 24 hours 11/11/21 0845 11/13/21 0912   11/09/21 2200  ceFEPIme (MAXIPIME) 2 g in sodium chloride 0.9 % 100 mL IVPB  Status:  Discontinued        2 g 200 mL/hr over 30 Minutes Intravenous Every 12 hours 11/09/21 1452 11/11/21 0845   11/09/21 1430  ceFEPIme (MAXIPIME) 2 g in sodium chloride 0.9 % 100 mL IVPB        2 g 200 mL/hr over 30 Minutes Intravenous  Once 11/09/21 1417 11/09/21 1540   11/09/21 1430  azithromycin (ZITHROMAX) 500 mg in sodium chloride 0.9 % 250 mL IVPB  Status:  Discontinued        500 mg 250 mL/hr over 60 Minutes Intravenous  Once 11/09/21 1420 11/09/21 1607        MEDICATIONS: Scheduled Meds:  allopurinol  150 mg Oral Daily   arformoterol  15 mcg Nebulization BID   budesonide (PULMICORT) nebulizer solution  0.25 mg Nebulization BID   clopidogrel  75 mg Oral QHS   diltiazem  120 mg Oral Daily   guaiFENesin  600 mg Oral BID   heparin  5,000 Units Subcutaneous Q8H   insulin aspart  0-15 Units Subcutaneous TID WC   insulin aspart  4 Units Subcutaneous TID WC   insulin glargine-yfgn  16 Units Subcutaneous Daily   ipratropium-albuterol  3 mL Nebulization TID   levothyroxine  125 mcg Oral Once per day on Mon Tue Wed Thu Fri Sat   levothyroxine  62.5 mcg Oral Q Sun   loratadine  10 mg Oral Daily   methylPREDNISolone (SOLU-MEDROL) injection  40 mg Intravenous Q24H   mirtazapine  15 mg Oral QHS   rosuvastatin  10 mg Oral QHS   Continuous Infusions:   PRN Meds:.acetaminophen **OR** acetaminophen, LORazepam   I have personally reviewed following labs and imaging  studies  LABORATORY DATA:  Recent Labs  Lab 11/09/21 1219 11/10/21 0341 11/10/21 0430 11/11/21 0319 11/12/21 0420 11/13/21 0237  WBC 11.6* 8.4  --  17.2* 12.9* 11.3*  HGB 11.9* 11.6* 10.9* 11.7* 11.4* 11.1*  HCT 35.2* 34.2* 32.0* 33.4* 32.2* 32.2*  PLT 201 187  --  217 204 192  MCV 100.3* 98.8  --  96.3 96.1 97.6  MCH 33.9 33.5  --  33.7 34.0 33.6  MCHC 33.8 33.9  --  35.0 35.4 34.5  RDW 13.2 13.2  --  13.3 13.2 13.2  LYMPHSABS 1.0  --   --   --   --  0.4*  MONOABS 0.9  --   --   --   --  0.7  EOSABS 0.0  --   --   --   --  0.0  BASOSABS 0.0  --   --   --   --  0.0    Recent Labs  Lab 11/09/21 1219 11/09/21 1419 11/10/21 0050 11/10/21 0341 11/10/21 0430 11/10/21 0946 11/11/21 0319 11/12/21 0420 11/12/21 0748 11/12/21 0945 11/12/21 1005 11/13/21 0237  NA 133*  --   --  137 138  --  138 137  --   --   --  137  K 3.7  --   --  4.1 4.1  --  3.9 4.4  --   --   --  4.2  CL 103  --   --  106  --   --  105 106  --   --   --  106  CO2 16*  --   --  19*  --   --  21* 23  --   --   --  22  GLUCOSE 184*  --   --  264*  --   --  247* 221*  --   --   --  230*  BUN 43*  --   --  50*  --   --  65* 64*  --   --   --  59*  CREATININE 1.85*  --   --  1.83*  --   --  2.12* 1.83*  --   --   --  1.68*  CALCIUM 9.1  --   --  9.4  --   --  9.2 8.8*  --   --   --  8.5*  AST 43*  --   --  40  --   --  42* 31  --   --   --  37  ALT 51*  --   --  53*  --   --  59* 50*  --   --   --  56*  ALKPHOS 67  --   --  59  --   --  61 58  --   --   --  61  BILITOT 0.7  --   --  0.7  --   --  0.6 0.7  --   --   --  0.5  ALBUMIN 3.5  --   --  3.2*  --   --  3.2* 3.0*  --   --   --  2.9*  MG  --   --   --  1.8  --   --   --   --   --   --   --  2.3  CRP  --   --   --   --   --  6.7* 3.6* 1.9*  --   --   --  1.2*  PROCALCITON  --  <0.10  --   --   --   --  <0.10  --   --  <0.10  --  <0.10  TSH  --   --   --  0.096*  --   --   --   --   --   --  0.058*  --   HGBA1C  --   --  6.7*  --   --   --   --   --    --   --   --   --   BNP 461.4*  --   --   --   --   --   --   --  221.4*  --   --  140.9*          RADIOLOGY STUDIES/RESULTS:  DG Chest Port 1 View  Result Date: 11/12/2021 CLINICAL DATA:  87 year old male with shortness of breath. Positive COVID-19. EXAM: PORTABLE CHEST 1 VIEW COMPARISON:  Chest CT 11/10/2021 and earlier. FINDINGS: Portable AP semi upright view at 0759 hours. Large lung volumes. Emphysema on CT. Vague, peripheral increased bilateral pulmonary opacity is stable from the the portable chest 2 days ago. No pneumothorax or pleural effusion. Stable cardiac size and mediastinal contours. Visualized tracheal air column is within normal limits. No acute osseous abnormality identified. Small surgical clips at the thoracic inlet from thyroidectomy. IMPRESSION: Emphysema with stable bilateral peripheral vague pulmonary opacity compatible with COVID-19 pneumonia. Electronically Signed   By: Genevie Ann M.D.   On: 11/12/2021 08:25     LOS: 4 days   Signature  Lala Lund M.D on 11/13/2021 at 11:06 AM   -  To page go to [email or web address]

## 2021-11-13 NOTE — Progress Notes (Signed)
Occupational Therapy Treatment Patient Details Name: David [name] MRN: [id number] DOB: August 13, 1931 Today's Date: 11/13/2021   History of present illness The pt is an 87 yo male presenting 11/17 with SOB and SPO2 in 80s. PMH includes: AAA, CKD III, COPD, DM II, HLD, HTN, CVA, and recent COVID (pos test on 11/5).   OT comments  David [name] is progressing well. He expressed concern with home O2, how to manage it and how to complete IADLs with O2, education and encouragement provided. Session focused on energy conservation hand out with education of techniques and strategies to implement to ensure safe completion of ADLs and IADLs. Pt was receptive and verbalized great understanding. He will continue to benefit from OT acutely to progress towards indep baseline. D/c recommendation remains appropriate.    Recommendations for follow up therapy are one component of a multi-disciplinary discharge planning process, led by the attending physician.  Recommendations may be updated based on patient status, additional functional criteria and insurance authorization.    Follow Up Recommendations  Home health OT    Assistance Recommended at Discharge Intermittent Supervision/Assistance  Equipment Recommendations  None recommended by OT       Precautions / Restrictions Precautions Precautions: Fall;Other (comment) Precaution Comments: monitor O2 Restrictions Weight Bearing Restrictions: No       Mobility Bed Mobility Overal bed mobility: Modified Independent                  Transfers Overall transfer level: Needs assistance Equipment used: None Transfers: Sit to/from Stand Sit to Stand: Supervision                     ADL either performed or assessed with clinical judgement   ADL Overall ADL's : Needs assistance/impaired       General ADL Comments: session focused on energy conservation strategies and education: focused on IADLs, showers, and functional ambulation     Extremity/Trunk Assessment Upper Extremity Assessment Upper Extremity Assessment: Generalized weakness   Lower Extremity Assessment Lower Extremity Assessment: Defer to PT evaluation        Vision   Vision Assessment?: No apparent visual deficits Additional Comments: wears glasses   Perception Perception Perception: Not tested   Praxis Praxis Praxis: Not tested    Cognition Arousal/Alertness: Awake/alert Behavior During Therapy: WFL for tasks assessed/performed Overall Cognitive Status: Within Functional Limits for tasks assessed             General Comments: very pleasant          Exercises Other Exercises Other Exercises: Energy conservation handout review and education   Shoulder Instructions       General Comments VSS on 2L [place]; pt with small skin tear to L 3rd digit    Pertinent Vitals/ Pain       Pain Assessment: No/denies pain   Frequency  Min 2X/week        Progress Toward Goals  OT Goals(current goals can now be found in the care plan section)  Progress towards OT goals: Progressing toward goals  Acute Rehab OT Goals Patient Stated Goal: home tomorrow OT Goal Formulation: With patient Time For Goal Achievement: 11/25/21 Potential to Achieve Goals: Good ADL Goals Pt Will Perform Lower Body Dressing: Independently;sit to/from stand Pt Will Transfer to Toilet: with modified independence;ambulating Pt/caregiver will Perform Home Exercise Program: Increased strength;Both right and left upper extremity;With theraband;Independently;With written HEP provided Additional ADL Goal #1: Pt to verbalize at least 3 energy conservation strategies to implement during ADLs/IADLs  Additional ADL Goal #2: Pt to increase standing activity tolerance > 7 min during ADLs/IADLs to maximize endurance  Plan Discharge plan remains appropriate       AM-PAC OT "6 Clicks" Daily Activity     Outcome Measure   Help from another person eating meals?: None Help  from another person taking care of personal grooming?: A Little Help from another person toileting, which includes using toliet, bedpan, or urinal?: A Little Help from another person bathing (including washing, rinsing, drying)?: A Little Help from another person to put on and taking off regular upper body clothing?: A Little Help from another person to put on and taking off regular lower body clothing?: A Little 6 Click Score: 19    End of Session    OT Visit Diagnosis: Unsteadiness on feet (R26.81);Other abnormalities of gait and mobility (R26.89);Muscle weakness (generalized) (M62.81)   Activity Tolerance Patient tolerated treatment well   Patient Left in bed;with call bell/phone within reach   Nurse Communication Mobility status        Time: 1330-1350 OT Time Calculation (min): 20 min  Charges: OT General Charges $OT Visit: 1 Visit OT Treatments $Self Care/Home Management : 8-22 mins    Syvanna Ciolino A Bryssa Tones 11/13/2021, 2:44 PM

## 2021-11-13 NOTE — Plan of Care (Signed)
?  Problem: Clinical Measurements: ?Goal: Ability to maintain clinical measurements within normal limits will improve ?Outcome: Progressing ?Goal: Will remain free from infection ?Outcome: Progressing ?Goal: Diagnostic test results will improve ?Outcome: Progressing ?  ?

## 2021-11-13 NOTE — Progress Notes (Signed)
RT instructed patient on the use of a flutter valve. Patient able to demonstrate back good technique. 

## 2021-11-13 NOTE — Progress Notes (Signed)
Physical Therapy Treatment Patient Details Name: David [name] MRN: [id number] DOB: 09-18-1931 Today's Date: 11/13/2021   History of Present Illness The pt is an 87 yo male presenting 11/17 with SOB and SPO2 in 80s. PMH includes: AAA, CKD III, COPD, DM II, HLD, HTN, CVA, and recent COVID (pos test on 11/5).    PT Comments    Pt progressing well towards his physical therapy goals, demonstrating improved balance and activity tolerance. Pt able to perform general strengthening exercises and ambulating 100 feet with a walker at a supervision level. SpO2 81-90% on 2-4L O2. Education provided regarding activity progression and energy conservation techniques. D/c plan remains appropriate.      Recommendations for follow up therapy are one component of a multi-disciplinary discharge planning process, led by the attending physician.  Recommendations may be updated based on patient status, additional functional criteria and insurance authorization.  Follow Up Recommendations  Home health PT     Assistance Recommended at Discharge Intermittent Supervision/Assistance  Equipment Recommendations  None recommended by PT    Recommendations for Other Services       Precautions / Restrictions Precautions Precautions: Fall;Other (comment) Precaution Comments: monitor O2 Restrictions Weight Bearing Restrictions: No     Mobility  Bed Mobility Overal bed mobility: Modified Independent                  Transfers Overall transfer level: Needs assistance Equipment used: Rolling walker (2 wheels) Transfers: Sit to/from Stand Sit to Stand: Supervision           General transfer comment: cues for hand placement    Ambulation/Gait Ambulation/Gait assistance: Supervision Gait Distance (Feet): 100 Feet Assistive device: Rolling walker (2 wheels) Gait Pattern/deviations: Step-through pattern;Decreased stride length Gait velocity: decreased     General Gait Details: slow and  steady pace, good posture   Stairs             Wheelchair Mobility    Modified Rankin (Stroke Patients Only)       Balance Overall balance assessment: Needs assistance Sitting-balance support: No upper extremity supported;Feet supported Sitting balance-Leahy Scale: Good     Standing balance support: Bilateral upper extremity supported;During functional activity Standing balance-Leahy Scale: Fair                              Cognition Arousal/Alertness: Awake/alert Behavior During Therapy: WFL for tasks assessed/performed Overall Cognitive Status: Within Functional Limits for tasks assessed                                 General Comments: very pleasant        Exercises General Exercises - Lower Extremity Long Arc Quad: Both;15 reps;Seated Hip Flexion/Marching: Both;10 reps;Seated Heel Raises: Both;10 reps;Standing Mini-Sqauts: Both;10 reps;Standing Other Exercises Other Exercises: x5 Sit to stands    General Comments General comments (skin integrity, edema, etc.): VSS on 2L [place]; pt with small skin tear to L 3rd digit      Pertinent Vitals/Pain Pain Assessment: No/denies pain    Home Living                          Prior Function            PT Goals (current goals can now be found in the care plan section) Acute Rehab PT Goals Patient Stated Goal: improve  mobility, decrease amount of oxygen required Potential to Achieve Goals: Good Progress towards PT goals: Progressing toward goals    Frequency    Min 3X/week      PT Plan Current plan remains appropriate    Co-evaluation              AM-PAC PT "6 Clicks" Mobility   Outcome Measure  Help needed turning from your back to your side while in a flat bed without using bedrails?: None Help needed moving from lying on your back to sitting on the side of a flat bed without using bedrails?: None Help needed moving to and from a bed to a chair (including  a wheelchair)?: A Little Help needed standing up from a chair using your arms (e.g., wheelchair or bedside chair)?: A Little Help needed to walk in hospital room?: A Little Help needed climbing 3-5 steps with a railing? : A Little 6 Click Score: 20    End of Session Equipment Utilized During Treatment: Gait belt Activity Tolerance: Patient tolerated treatment well Patient left: in chair;with call bell/phone within reach;with family/visitor present Nurse Communication: Mobility status PT Visit Diagnosis: Unsteadiness on feet (R26.81);Other abnormalities of gait and mobility (R26.89);Repeated falls (R29.6);Muscle weakness (generalized) (M62.81);Difficulty in walking, not elsewhere classified (R26.2)     Time: 4696-2952 PT Time Calculation (min) (ACUTE ONLY): 33 min  Charges:  $Therapeutic Exercise: 8-22 mins $Therapeutic Activity: 8-22 mins                     Wyona Almas, PT, DPT Acute Rehabilitation Services Pager [phone] Office [phone]    Deno Etienne 11/13/2021, 4:38 PM

## 2021-11-14 LAB — COMPREHENSIVE METABOLIC PANEL
ALT: 53 U/L — ABNORMAL HIGH (ref 0–44)
AST: 30 U/L (ref 15–41)
Albumin: 2.9 g/dL — ABNORMAL LOW (ref 3.5–5.0)
Alkaline Phosphatase: 57 U/L (ref 38–126)
Anion gap: 8 (ref 5–15)
BUN: 58 mg/dL — ABNORMAL HIGH (ref 8–23)
CO2: 21 mmol/L — ABNORMAL LOW (ref 22–32)
Calcium: 8.5 mg/dL — ABNORMAL LOW (ref 8.9–10.3)
Chloride: 110 mmol/L (ref 98–111)
Creatinine, Ser: 1.74 mg/dL — ABNORMAL HIGH (ref 0.61–1.24)
GFR, Estimated: 37 mL/min — ABNORMAL LOW (ref >60)
Glucose, Bld: 191 mg/dL — ABNORMAL HIGH (ref 70–99)
Potassium: 4.2 mmol/L (ref 3.5–5.1)
Sodium: 139 mmol/L (ref 135–145)
Total Bilirubin: 0.5 mg/dL (ref 0.3–1.2)
Total Protein: 5.5 g/dL — ABNORMAL LOW (ref 6.5–8.1)

## 2021-11-14 LAB — CBC WITH DIFFERENTIAL/PLATELET
Abs Immature Granulocytes: 0.32 10*3/uL — ABNORMAL HIGH (ref 0.00–0.07)
Basophils Absolute: 0 10*3/uL (ref 0.0–0.1)
Basophils Relative: 0 %
Eosinophils Absolute: 0 10*3/uL (ref 0.0–0.5)
Eosinophils Relative: 0 %
HCT: 31.6 % — ABNORMAL LOW (ref 39.0–52.0)
Hemoglobin: 10.9 g/dL — ABNORMAL LOW (ref 13.0–17.0)
Immature Granulocytes: 4 %
Lymphocytes Relative: 5 %
Lymphs Abs: 0.4 10*3/uL — ABNORMAL LOW (ref 0.7–4.0)
MCH: 33 pg (ref 26.0–34.0)
MCHC: 34.5 g/dL (ref 30.0–36.0)
MCV: 95.8 fL (ref 80.0–100.0)
Monocytes Absolute: 0.5 10*3/uL (ref 0.1–1.0)
Monocytes Relative: 5 %
Neutro Abs: 8 10*3/uL — ABNORMAL HIGH (ref 1.7–7.7)
Neutrophils Relative %: 86 %
Platelets: 192 10*3/uL (ref 150–400)
RBC: 3.3 MIL/uL — ABNORMAL LOW (ref 4.22–5.81)
RDW: 13.4 % (ref 11.5–15.5)
WBC: 9.2 10*3/uL (ref 4.0–10.5)
nRBC: 0 % (ref 0.0–0.2)

## 2021-11-14 LAB — BRAIN NATRIURETIC PEPTIDE: B Natriuretic Peptide: 157.2 pg/mL — ABNORMAL HIGH (ref 0.0–100.0)

## 2021-11-14 LAB — T3: T3, Total: 70 ng/dL — ABNORMAL LOW (ref 71–180)

## 2021-11-14 LAB — PROCALCITONIN: Procalcitonin: 6.55 ng/mL

## 2021-11-14 LAB — GLUCOSE, CAPILLARY
Glucose-Capillary: 156 mg/dL — ABNORMAL HIGH (ref 70–99)
Glucose-Capillary: 65 mg/dL — ABNORMAL LOW (ref 70–99)
Glucose-Capillary: 107 mg/dL — ABNORMAL HIGH (ref 70–99)

## 2021-11-14 LAB — MAGNESIUM: Magnesium: 2.2 mg/dL (ref 1.7–2.4)

## 2021-11-14 LAB — C-REACTIVE PROTEIN: CRP: 0.9 mg/dL (ref <1.0)

## 2021-11-14 MED ORDER — GUAIFENESIN ER 600 MG PO TB12: 600.0000 mg | ORAL_TABLET | Freq: Two times a day (BID) | ORAL | Status: DC

## 2021-11-14 MED ORDER — LEVOTHYROXINE SODIUM 100 MCG PO TABS: 100.0000 ug | ORAL_TABLET | Freq: Every day | ORAL | 0 refills | Status: DC

## 2021-11-14 MED ORDER — LEVOTHYROXINE SODIUM 125 MCG PO TABS: 62.5000 ug | ORAL_TABLET | ORAL | 0 refills | Status: DC

## 2021-11-14 MED ORDER — METHYLPREDNISOLONE SODIUM SUCC 40 MG IJ SOLR
20.0000 mg | INTRAMUSCULAR | Status: DC
  Administered 2021-11-14: 20 mg via INTRAVENOUS
  Filled 2021-11-14: qty 1

## 2021-11-14 MED ORDER — LEVOTHYROXINE SODIUM 100 MCG PO TABS
100.0000 ug | ORAL_TABLET | ORAL | Status: DC
Start: 2021-11-15 — End: 2021-11-14

## 2021-11-14 NOTE — Progress Notes (Signed)
DISCHARGE NOTE HOME EDELMIRO INNOCENT to be discharged Home per MD order. Discussed prescriptions and follow up appointments with the patient. Prescriptions given to patient; medication list explained in detail. Patient verbalized understanding.  Skin clean, dry and intact without evidence of skin break down, no evidence of skin tears noted. IV catheter discontinued intact. Site without signs and symptoms of complications. Dressing and pressure applied. Pt denies pain at the site currently. Patient with O2  per [place] homebound. No complaints noted.  Patient free of lines, drains, and wounds.   An After Visit Summary (AVS) was printed and given to the patient. Patient escorted via wheelchair, and discharged home via private auto.  Dolores Hoose, RN

## 2021-11-14 NOTE — Plan of Care (Signed)
  Problem: Acute Rehab PT Goals(only PT should resolve) Goal: Pt Will Go Supine/Side To Sit Outcome: Adequate for Discharge Goal: Patient Will Transfer Sit To/From Stand Outcome: Adequate for Discharge Goal: Pt Will Transfer Bed To Chair/Chair To Bed Outcome: Adequate for Discharge Goal: Pt Will Ambulate Outcome: Adequate for Discharge Goal: Pt Will Go Up/Down Stairs Outcome: Adequate for Discharge Goal: Pt/caregiver will Perform Home Exercise Program Outcome: Adequate for Discharge   Problem: Clinical Measurements: Goal: Ability to maintain clinical measurements within normal limits will improve 11/14/2021 1119 by Dolores Hoose, RN Outcome: Adequate for Discharge 11/14/2021 1975 by Dolores Hoose, RN Outcome: Progressing Goal: Will remain free from infection Outcome: Adequate for Discharge Goal: Diagnostic test results will improve Outcome: Adequate for Discharge Goal: Respiratory complications will improve 11/14/2021 1119 by Dolores Hoose, RN Outcome: Adequate for Discharge 11/14/2021 8832 by Dolores Hoose, RN Outcome: Progressing Goal: Cardiovascular complication will be avoided Outcome: Adequate for Discharge   Problem: Activity: Goal: Risk for activity intolerance will decrease 11/14/2021 1119 by Dolores Hoose, RN Outcome: Adequate for Discharge 11/14/2021 5498 by Dolores Hoose, RN Outcome: Progressing   Problem: Nutrition: Goal: Adequate nutrition will be maintained 11/14/2021 1119 by Dolores Hoose, RN Outcome: Adequate for Discharge 11/14/2021 2641 by Dolores Hoose, RN Outcome: Progressing   Problem: Coping: Goal: Level of anxiety will decrease 11/14/2021 1119 by Dolores Hoose, RN Outcome: Adequate for Discharge 11/14/2021 5830 by Dolores Hoose, RN Outcome: Progressing   Problem: Elimination: Goal: Will not experience complications related to bowel motility Outcome: Adequate for Discharge Goal: Will not experience  complications related to urinary retention Outcome: Adequate for Discharge   Problem: Pain Managment: Goal: General experience of comfort will improve Outcome: Adequate for Discharge   Problem: Safety: Goal: Ability to remain free from injury will improve Outcome: Adequate for Discharge   Problem: Skin Integrity: Goal: Risk for impaired skin integrity will decrease Outcome: Adequate for Discharge   Problem: Acute Rehab OT Goals (only OT should resolve) Goal: Pt. Will Perform Lower Body Dressing Outcome: Adequate for Discharge Goal: Pt. Will Transfer To Toilet Outcome: Adequate for Discharge Goal: Pt/Caregiver Will Perform Home Exercise Program Outcome: Adequate for Discharge Goal: OT Additional ADL Goal #1 Outcome: Adequate for Discharge Goal: OT Additional ADL Goal #2 Outcome: Adequate for Discharge

## 2021-11-14 NOTE — Discharge Instructions (Addendum)
Follow with Primary MD [name], MD in 7 days   Get CBC, CMP, TSH, Free T4, T3,  2 view Chest X ray -  checked next visit within 1 week by Primary MD    Activity: As tolerated with Full fall precautions use walker/cane & assistance as needed  Disposition Home   Diet: Heart Healthy Low Carb   Special Instructions: If you have smoked or chewed Tobacco  in the last 2 yrs please stop smoking, stop any regular Alcohol  and or any Recreational drug use.  On your next visit with your primary care physician please Get Medicines reviewed and adjusted.  Please request your Prim.MD to go over all Hospital Tests and Procedure/Radiological results at the follow up, please get all Hospital records sent to your Prim MD by signing hospital release before you go home.  If you experience worsening of your admission symptoms, develop shortness of breath, life threatening emergency, suicidal or homicidal thoughts you must seek medical attention immediately by calling 911 or calling your MD immediately  if symptoms less severe.  You Must read complete instructions/literature along with all the possible adverse reactions/side effects for all the Medicines you take and that have been prescribed to you. Take any new Medicines after you have completely understood and accpet all the possible adverse reactions/side effects.

## 2021-11-14 NOTE — Discharge Summary (Signed)
David [name] YFV:[id number] DOB: 1931-02-27 DOA: 11/09/2021  PCP: [name], MD  Admit date: 11/09/2021  Discharge date: 11/14/2021  Admitted From: Home   Disposition:  Home   Recommendations for Outpatient Follow-up:   Follow up with PCP in 1-2 weeks  PCP Please obtain BMP/CBC, 2 view CXR in 1week,  (see Discharge instructions)   PCP Please follow up on the following pending results:    Home Health: PT,RN   Equipment/Devices: as below  Consultations: None  Discharge Condition: Stable    CODE STATUS: Full    Diet Recommendation: Heart Healthy Low Carb  Diet Order             Diet - low sodium heart healthy           Diet Carb Modified Fluid consistency: Thin; Room service appropriate? Yes  Diet effective now                    Chief Complaint  Patient presents with   Shortness of Breath     Brief history of present illness from the day of admission and additional interim summary    Patient is a 87 y.o. male with history of TIA, HTN, HLD, COPD, CKD stage IIIb, recent COVID infection on 11/15-presenting with 1 week history of cough, shortness of breath and intermittent fever (PCP had started cefdinir prior to admission).  He was found to have acute hypoxic respiratory failure due to presumed pneumonia.  See below for further details.   Pertinent Labs/Radiology:   11/18>> blood culture: -ve   11/17>>CXR: Prominent interstitial markings in the right perihilar region-no focal consolidation 11/18>> CXR: Progressive peripheral interstitial/airspace disease bilaterally. 11/18>> CT chest: Bilateral/peripheral opacities-suggesting mild pneumonitis/viral PNA. 11/18>> Echo: EF 60-65%                                                                 Hospital Course    Acute hypoxic  respiratory failure likely due to community-acquired pneumonia vs post COVID inflammatory pneumonitis: Was treated with short course of antibiotics and steroids, he is now symptom-free, lung sounds are stable, he has finished his inpatient treatment with steroids and antibiotics, currently on 2 L nasal cannula oxygen upon ambulation at rest he is on 1 L with pulse ox above 95% and no respiratory distress.  Will be discharged home with close outpatient PCP follow-up.  Note he is very frail at baseline.   COPD: Not in exacerbation-continue bronchodilators.   HFpEF EF 60% : Not in exacerbation-volume status stable-continue home medications and follow with PCP and primary cardiologist postdischarge.  Hypothyroidism: TSH was suppressed with high free T4 and low T3, Synthroid dose dropped, PCP to monitor closely.David [name]   History of TIA: No focal neurological deficits-continue Plavix/statin   HLD: Continue statin  AKI CKD stage IIIb: Baseline creatinine around 1.8, was dehydrated resolved after hydration.   History of PVCs/PACs: Continue telemetry monitoring-on Cardizem.   HTN: BP stable-on Lasix/Cardizem continue to hold valsartan/HCTZ for now.   Debility: Obtain PT/OT.  DM-2 (A1c 7.4 on 1/7) with uncontrolled hyperglycemia due to steroids: CBGs better but still elevated-increase Semglee to 16 units daily-continue 4 units of NovoLog with meals and SSI.  Reassess on 11/20.    Lab Results  Component Value Date   HGBA1C 6.7 (H) 11/10/2021    CBG (last 3)  Recent Labs    11/13/21 1629 11/13/21 2044 11/14/21 0615  GLUCAP 255* 145* 156*    Discharge diagnosis     Principal Problem:   Acute respiratory failure with hypoxia (HCC) Active Problems:   HTN (hypertension)   Type 2 diabetes mellitus with diabetic chronic kidney disease (HCC)   CKD (chronic kidney disease) stage 3, GFR 30-59 ml/min (HCC)   Hypothyroidism   Leukocytosis   PVC (premature ventricular contraction)   History of  stroke   COPD with acute exacerbation ([place])    Discharge instructions    Discharge Instructions     Diet - low sodium heart healthy   Complete by: As directed    Discharge instructions   Complete by: As directed    Follow with Primary MD [name], MD in 7 days   Get CBC, CMP, TSH, Free T4, T3,  2 view Chest X ray -  checked next visit within 1 week by Primary MD    Activity: As tolerated with Full fall precautions use walker/cane & assistance as needed  Disposition Home   Diet: Heart Healthy Low Carb   Special Instructions: If you have smoked or chewed Tobacco  in the last 2 yrs please stop smoking, stop any regular Alcohol  and or any Recreational drug use.  On your next visit with your primary care physician please Get Medicines reviewed and adjusted.  Please request your Prim.MD to go over all Hospital Tests and Procedure/Radiological results at the follow up, please get all Hospital records sent to your Prim MD by signing hospital release before you go home.  If you experience worsening of your admission symptoms, develop shortness of breath, life threatening emergency, suicidal or homicidal thoughts you must seek medical attention immediately by calling 911 or calling your MD immediately  if symptoms less severe.  You Must read complete instructions/literature along with all the possible adverse reactions/side effects for all the Medicines you take and that have been prescribed to you. Take any new Medicines after you have completely understood and accpet all the possible adverse reactions/side effects.   Increase activity slowly   Complete by: As directed        Discharge Medications   Allergies as of 11/14/2021       Reactions   Azithromycin Rash   zpack   Doxycycline Rash        Medication List     STOP taking these medications    cefdinir 300 MG capsule Commonly known as: OMNICEF   predniSONE 5 MG tablet Commonly known as: DELTASONE        TAKE these medications    acetaminophen 500 MG tablet Commonly known as: TYLENOL Take 500-1,000 mg by mouth See admin instructions. Take 500 mg daily at bedtime and may take an additional 500mg  tablet if needed for pain   allopurinol 100 MG tablet Commonly known as: ZYLOPRIM Take 150 mg by mouth daily.   clopidogrel  75 MG tablet Commonly known as: PLAVIX Take 75 mg by mouth at bedtime.   Combivent Respimat 20-100 MCG/ACT Aers respimat Generic drug: Ipratropium-Albuterol Inhale 1 puff into the lungs in the morning and at bedtime.   ipratropium-albuterol 0.5-2.5 (3) MG/3ML Soln Commonly known as: DUONEB Take 3 mLs by nebulization 2 (two) times daily as needed for wheezing or shortness of breath.   diltiazem 120 MG 24 hr capsule Commonly known as: CARDIZEM CD Take 1 capsule (120 mg total) by mouth daily.   fexofenadine 180 MG tablet Commonly known as: ALLEGRA Take 180 mg by mouth at bedtime.   Fluticasone-Salmeterol 250-50 MCG/DOSE Aepb Commonly known as: ADVAIR Inhale 1 puff into the lungs every 12 (twelve) hours.   HumaLOG KwikPen 100 UNIT/ML KwikPen Generic drug: insulin lispro Inject 4 Units into the skin See admin instructions. 4 units in the morning and 4 units late afternoon/supper time   Levemir FlexTouch 100 UNIT/ML FlexPen Generic drug: insulin detemir Inject 7 Units into the skin at bedtime.   levothyroxine 100 MCG tablet Commonly known as: SYNTHROID Take 1 tablet (100 mcg total) by mouth daily before breakfast. What changed: You were already taking a medication with the same name, and this prescription was added. Make sure you understand how and when to take each.   levothyroxine 125 MCG tablet Commonly known as: SYNTHROID Take 0.5 tablets (62.5 mcg total) by mouth every Sunday. Start taking on: November 19, 2021 What changed:  how much to take when to take this additional instructions   LORazepam 0.5 MG tablet Commonly known as: ATIVAN Take  0.25-0.5 mg by mouth daily as needed for anxiety.   mirtazapine 15 MG disintegrating tablet Commonly known as: REMERON SOL-TAB Take 15 mg by mouth at bedtime.   rosuvastatin 10 MG tablet Commonly known as: CRESTOR Take 10 mg by mouth at bedtime.   valsartan-hydrochlorothiazide 80-12.5 MG tablet Commonly known as: DIOVAN-HCT Take 1 tablet by mouth daily.   Vitamin D (Ergocalciferol) 1.25 MG (50000 UNIT) Caps capsule Commonly known as: DRISDOL Take 50,000 Units by mouth every Monday.               Durable Medical Equipment  (From admission, onward)           Start     Ordered   11/13/21 0810  For home use only DME oxygen  Once       Question Answer Comment  Length of Need 6 Months   Mode or (Route) Nasal cannula   Liters per Minute 2   Frequency Continuous (stationary and portable oxygen unit needed)   Oxygen conserving device Yes   Oxygen delivery system Gas      11 /21/22 0809             Follow-up Information     [name], MD. Schedule an appointment as soon as possible for a visit in 1 week(s).   Specialty: Internal Medicine Contact information: [place] [address] [phone]         Werner Lean, MD. Schedule an appointment as soon as possible for a visit in 1 week(s).   Specialty: Cardiology Contact information: [place] [address] [phone]                 Major procedures and Radiology Reports - PLEASE review detailed and final reports thoroughly  -       DG Chest 2 View  Result Date: 11/09/2021 CLINICAL DATA:  Shortness of breath, cough EXAM: CHEST - 2 VIEW COMPARISON:  Previous studies including the CT chest done on 08/17/2021 FINDINGS: Cardiac size is within normal limits. Low position of diaphragms suggests COPD. Thoracic aorta is tortuous and ectatic. There are no signs of alveolar pulmonary edema. There is prominence of interstitial markings in the right  parahilar region and both lower lung fields which was not evident in the chest radiograph done on 07/23/2017. There is complete clearing of infiltrate in the right apical region since 07/23/2017. IMPRESSION: COPD. There are no signs of alveolar pulmonary edema. There is no focal pulmonary consolidation. There is prominence of interstitial markings in the right parahilar region and both lower lung fields suggesting scarring. Possibility of superimposed interstitial pneumonia is not excluded. There is no pleural effusion or pneumothorax. Electronically Signed   By: Elmer Picker M.D.   On: 11/09/2021 12:48   CT CHEST WO CONTRAST  Result Date: 11/10/2021 CLINICAL DATA:  Recent COVID infection 11/07/2021 cough and shortness of breath, fever EXAM: CT CHEST WITHOUT CONTRAST TECHNIQUE: Multidetector CT imaging of the chest was performed following the standard protocol without IV contrast. COMPARISON:  08/17/2020 FINDINGS: Cardiovascular: Limited without IV contrast. Calcific atherosclerosis of the aorta and major branch vessels. Stable tortuosity and mild aneurysmal dilatation of the aorta, maximal diameter descending aorta 4 cm. No mediastinal hemorrhage or hematoma. Normal heart size. Native coronary atherosclerosis. Aortic valve calcifications as well. No pericardial effusion. Mediastinum/Nodes: Thyroid appears atrophic. No bulky adenopathy. Calcified mediastinal and right hilar lymph nodes from remote granulomatous disease. Trachea and central airways are patent. Esophagus nondilated. No hiatal hernia. Lungs/Pleura: Background progressive mixed centrilobular and paraseptal emphysema. Stable subcentimeter calcified granuloma in the right upper lobe. Scarring and present bilaterally. Stable 6 mm nodule in the right upper lobe, image 44/4. Stable peripheral calcified granuloma in the lingula, image 101/4. Stable posterior subpleural left lower lobe 7 mm nodule, image 130/4. No new or enlarging pulmonary nodule.  Increased mild peripheral patchy ground-glass attenuation in the lower lobes well as the inferior right middle lobe. This is nonspecific for pneumonitis but can be seen with viral infection/COVID. No pleural abnormality, effusion, or pneumothorax. Upper Abdomen: No acute upper abdominal finding. Abdominal aortic atherosclerosis. Exophytic upper pole left renal cyst as before. Musculoskeletal: Degenerative changes of the spine. Intact chest wall. No soft tissue asymmetry. No acute osseous finding. Intact sternum. No compression fracture. IMPRESSION: Slight progression of the pulmonary emphysema pattern with bilateral lower lobe peripheral patchy ground-glass mild opacities suggesting mild pneumonitis/viral pneumonia. Stable calcified and noncalcified pulmonary nodules as detailed above. Aortic and native coronary atherosclerosis. Aortic Atherosclerosis (ICD10-I70.0) and Emphysema (ICD10-J43.9). Electronically Signed   By: Jerilynn Mages.  Shick M.D.   On: 11/10/2021 11:24   DG Chest Port 1 View  Result Date: 11/12/2021 CLINICAL DATA:  87 year old male with shortness of breath. Positive COVID-19. EXAM: PORTABLE CHEST 1 VIEW COMPARISON:  Chest CT 11/10/2021 and earlier. FINDINGS: Portable AP semi upright view at 0759 hours. Large lung volumes. Emphysema on CT. Vague, peripheral increased bilateral pulmonary opacity is stable from the the portable chest 2 days ago. No pneumothorax or pleural effusion. Stable cardiac size and mediastinal contours. Visualized tracheal air column is within normal limits. No acute osseous abnormality identified. Small surgical clips at the thoracic inlet from thyroidectomy. IMPRESSION: Emphysema with stable bilateral peripheral vague pulmonary opacity compatible with COVID-19 pneumonia. Electronically Signed   By: Genevie Ann M.D.   On: 11/12/2021 08:25   DG Chest Port 1V same Day  Result  Date: 11/10/2021 CLINICAL DATA:  Shortness of breath. EXAM: PORTABLE CHEST 1 VIEW COMPARISON:  Chest CT  08/17/2020.  Chest x-ray 07/23/2017 FINDINGS: 0726 hours. Lungs are hyperexpanded. Interstitial markings are diffusely coarsened with chronic features. There is peripheral more prominent interstitial and possible airspace opacity in both mid lungs in the left base. This appears progressive since prior chest x-ray and CT of 08/17/2020. The cardiopericardial silhouette is within normal limits for size. Bones are diffusely demineralized. Telemetry leads overlie the chest. IMPRESSION: 1. Emphysema with chronic interstitial lung disease. 2. Progressive peripheral interstitial and airspace disease in the mid lungs bilaterally and left base. Atypical/viral infection could have this appearance. Electronically Signed   By: Misty Stanley M.D.   On: 11/10/2021 07:45   ECHOCARDIOGRAM COMPLETE  Result Date: 11/10/2021    ECHOCARDIOGRAM REPORT   Patient Name:   David [name] Date of Exam: 11/10/2021 Medical Rec #:  [id number]          Height:       72.0 in Accession #:    [id number]         Weight:       175.0 lb Date of Birth:  1931-09-26         BSA:          2.013 m Patient Age:    87 years           BP:           160/69 mmHg Patient Gender: M                  HR:           65 bpm. Exam Location:  Inpatient Procedure: 2D Echo, Cardiac Doppler and Color Doppler Indications:    Respiratory distress  History:        Patient has prior history of Echocardiogram examinations, most                 recent 05/19/2021. COPD, Arrythmias:PVC and PAC,                 Signs/Symptoms:Murmur; Risk Factors:Hypertension and                 Dyslipidemia. CV 19 positive.  Sonographer:    Merrie Roof RDCS Referring Phys: [place]  1. Left ventricular ejection fraction, by estimation, is 60 to 65%. The left ventricle has normal function. The left ventricle has no regional wall motion abnormalities. Left ventricular diastolic function could not be evaluated.  2. Right ventricular systolic function is normal. The  right ventricular size is normal.  3. Left atrial size was severely dilated.  4. Right atrial size was moderately dilated.  5. The mitral valve is normal in structure. No evidence of mitral valve regurgitation. No evidence of mitral stenosis.  6. The aortic valve is normal in structure. There is mild calcification of the aortic valve. There is mild thickening of the aortic valve. Aortic valve regurgitation is not visualized. Mild aortic valve stenosis. Aortic valve area, by VTI measures 1.86 cm. Aortic valve mean gradient measures 11.0 mmHg. Aortic valve Vmax measures 2.25 m/s.  7. The inferior vena cava is dilated in size with >50% respiratory variability, suggesting right atrial pressure of 8 mmHg. FINDINGS  Left Ventricle: Left ventricular ejection fraction, by estimation, is 60 to 65%. The left ventricle has normal function. The left ventricle has no regional wall motion abnormalities. The left ventricular internal cavity size was normal in  size. There is  no left ventricular hypertrophy. Left ventricular diastolic function could not be evaluated. Right Ventricle: The right ventricular size is normal. No increase in right ventricular wall thickness. Right ventricular systolic function is normal. Left Atrium: Left atrial size was severely dilated. Right Atrium: Right atrial size was moderately dilated. Pericardium: There is no evidence of pericardial effusion. Mitral Valve: The mitral valve is normal in structure. No evidence of mitral valve regurgitation. No evidence of mitral valve stenosis. Tricuspid Valve: The tricuspid valve is normal in structure. Tricuspid valve regurgitation is trivial. No evidence of tricuspid stenosis. Aortic Valve: The aortic valve is normal in structure. There is mild calcification of the aortic valve. There is mild thickening of the aortic valve. Aortic valve regurgitation is not visualized. Mild aortic stenosis is present. Aortic valve mean gradient measures 11.0 mmHg. Aortic valve  peak gradient measures 20.2 mmHg. Aortic valve area, by VTI measures 1.86 cm. Pulmonic Valve: The pulmonic valve was normal in structure. Pulmonic valve regurgitation is not visualized. No evidence of pulmonic stenosis. Aorta: The aortic root is normal in size and structure. Venous: The inferior vena cava is dilated in size with greater than 50% respiratory variability, suggesting right atrial pressure of 8 mmHg. IAS/Shunts: No atrial level shunt detected by color flow Doppler.  LEFT VENTRICLE PLAX 2D LVOT diam:     2.10 cm LV SV:         80 LV SV Index:   40 LVOT Area:     3.46 cm  IVC IVC diam: 2.40 cm LEFT ATRIUM              Index        RIGHT ATRIUM           Index LA Vol (A2C):   75.1 ml  37.31 ml/m  RA Area:     22.90 cm LA Vol (A4C):   112.0 ml 55.64 ml/m  RA Volume:   71.80 ml  35.67 ml/m LA Biplane Vol: 100.0 ml 49.68 ml/m  AORTIC VALVE AV Area (Vmax):    1.85 cm AV Area (Vmean):   1.84 cm AV Area (VTI):     1.86 cm AV Vmax:           225.00 cm/s AV Vmean:          150.000 cm/s AV VTI:            0.432 m AV Peak Grad:      20.2 mmHg AV Mean Grad:      11.0 mmHg LVOT Vmax:         120.00 cm/s LVOT Vmean:        79.600 cm/s LVOT VTI:          0.232 m LVOT/AV VTI ratio: 0.54  SHUNTS Systemic VTI:  0.23 m Systemic Diam: 2.10 cm Skeet Latch MD Electronically signed by Skeet Latch MD Signature Date/Time: 11/10/2021/6:25:52 PM    Final      Today   Subjective   Gabriel Earing today has no headache,no chest abdominal pain,no new weakness tingling or numbness, feels much better wants to go home today.     Objective   Blood pressure 126/72, pulse 65, temperature (!) 97.4 F (36.3 C), temperature source Oral, resp. rate 17, height 6' (1.829 m), weight 78.3 kg, SpO2 94 %.   Intake/Output Summary (Last 24 hours) at 11/14/2021 0938 Last data filed at 11/14/2021 0600 Gross per 24 hour  Intake 760 ml  Output 275 ml  Net 485 ml    Exam  Awake Alert, No new F.N deficits, Normal  affect [place].AT,PERRAL Supple Neck,No JVD, No cervical lymphadenopathy appriciated.  Symmetrical Chest wall movement, Good air movement bilaterally, CTAB RRR,No Gallops,Rubs or new Murmurs, No Parasternal Heave +ve B.Sounds, Abd Soft, Non tender, No organomegaly appriciated, No rebound -guarding or rigidity. No Cyanosis, Clubbing or edema, No new Rash or bruise   Data Review   CBC w Diff:  Lab Results  Component Value Date   WBC 9.2 11/14/2021   HGB 10.9 (L) 11/14/2021   HGB 14.1 01/17/2011   HCT 31.6 (L) 11/14/2021   HCT 40.6 01/17/2011   PLT 192 11/14/2021   PLT 152 01/17/2011   LYMPHOPCT 5 11/14/2021   LYMPHOPCT 22.3 01/17/2011   MONOPCT 5 11/14/2021   MONOPCT 16.5 (H) 01/17/2011   EOSPCT 0 11/14/2021   EOSPCT 2.8 01/17/2011   BASOPCT 0 11/14/2021   BASOPCT 0.5 01/17/2011    CMP:  Lab Results  Component Value Date   NA 139 11/14/2021   K 4.2 11/14/2021   CL 110 11/14/2021   CO2 21 (L) 11/14/2021   BUN 58 (H) 11/14/2021   CREATININE 1.74 (H) 11/14/2021   CREATININE 0.84 09/15/2014   PROT 5.5 (L) 11/14/2021   ALBUMIN 2.9 (L) 11/14/2021   BILITOT 0.5 11/14/2021   ALKPHOS 57 11/14/2021   AST 30 11/14/2021   ALT 53 (H) 11/14/2021  .   Total Time in preparing paper work, data evaluation and todays exam - 64 minutes  Lala Lund M.D on 11/14/2021 at 9:38 AM  [facility]

## 2021-11-14 NOTE — Plan of Care (Signed)
  Problem: Clinical Measurements: Goal: Ability to maintain clinical measurements within normal limits will improve Outcome: Progressing Goal: Respiratory complications will improve Outcome: Progressing   Problem: Activity: Goal: Risk for activity intolerance will decrease Outcome: Progressing   Problem: Nutrition: Goal: Adequate nutrition will be maintained Outcome: Progressing   Problem: Coping: Goal: Level of anxiety will decrease Outcome: Progressing

## 2021-11-14 NOTE — TOC Transition Note (Signed)
Transition of Care [facility]) - CM/SW Discharge Note   Patient Details  Name: David [name] MRN: [id number] Date of Birth: 02/18/1931  [facility] CM/SW Contact:  Tom-Johnson, Renea Ee, RN Phone Number: 11/14/2021, 12:03 PM   Clinical Narrative:    Patient is scheduled for discharge today. From home with wife. Presented with Shortness of breath, cough and fever for a week. Found to be Covid positive. Treated and medically ready for discharge. CM consulted for home health and home oxygen needs. CM called and spoke with Tommi Rumps [phone]) with [facility] and referral made with acceptance voiced. CM also called and spoke with Thedore Mins [phone]) with [employer] and home oxygen ordered.Wife to transport patient at discharge. No further TOC needs noted.   Final next level of care: [place] Barriers to Discharge: Barriers Resolved   Patient Goals and CMS Choice Patient states their goals for this hospitalization and ongoing recovery are:: To go home CMS Medicare.gov Compare Post Acute Care list provided to:: Patient Choice offered to / list presented to : Patient  Discharge Placement                       Discharge Plan and Services                DME Arranged: Oxygen DME Agency: [employer] Date DME Agency Contacted: 11/14/21 Time DME Agency Contacted: [id number] Representative spoke with at DME Agency: Thedore Mins HH Arranged: PT, OT [place] Agency: [place] Date [place]: 11/14/21 Time [place]: 1138 Representative spoke with at [place]: [place] Determinants of Health ([facility]) Interventions     Readmission Risk Interventions No flowsheet data found.

## 2021-11-15 LAB — CULTURE, BLOOD (ROUTINE X 2)
Culture: NO GROWTH
Culture: NO GROWTH
Special Requests: ADEQUATE

## 2021-12-01 ENCOUNTER — Ambulatory Visit: Payer: Medicare Other | Admitting: Pulmonary Disease

## 2021-12-01 ENCOUNTER — Other Ambulatory Visit: Payer: Self-pay

## 2021-12-01 ENCOUNTER — Encounter: Payer: Self-pay | Admitting: Pulmonary Disease

## 2021-12-01 VITALS — BP 114/68 | HR 83 | Ht 72.0 in | Wt 171.0 lb

## 2021-12-01 DIAGNOSIS — U071 COVID-19: Secondary | ICD-10-CM

## 2021-12-01 DIAGNOSIS — J432 Centrilobular emphysema: Secondary | ICD-10-CM | POA: Diagnosis not present

## 2021-12-01 DIAGNOSIS — J9601 Acute respiratory failure with hypoxia: Secondary | ICD-10-CM

## 2021-12-01 DIAGNOSIS — J189 Pneumonia, unspecified organism: Secondary | ICD-10-CM

## 2021-12-01 NOTE — Progress Notes (Signed)
Synopsis: Referred in December 2022 for Pneumonia by Claud Kelp, NP  Subjective:   PATIENT ID: David [name] GENDER: male DOB: [birth year], MRN: [id number]   HPI  Chief Complaint  Patient presents with   Consult    Referred by PCP. Was diagnosed with PNA 3 weeks, COVID 4 weeks ago. Using 2L of O2.    David [name] is an 87 year old male, former smoker with DMII, hypertension, CVA, and COPD who is referred to pulmonary clinic for pneumonia.   He was admitted 11/18 to 11/22 for acute respiratory failure and pneumonia. He tested positive for covid on 11/15. He was treated with short course of antibiotics and steroids. He was discharged on supplemental O2, 1L at rest and 2L with ambulation.   Patient is feeling somewhat better since hospitalization but he continues to have cough with clear sputum production and exertional shortness of breath.  He has also noticed mild intermittent epistaxis since starting oxygen therapy.  He is using Advair 250-50 MCG 1 puff twice daily, Combivent a couple times a day and DuoNeb nebulizer treatments a couple times per day.  He has flutter valve from hospital that he is using throughout the day as well. He completed course of levaquin yesterday.   Patient reports history of premature ventricular contractions as well as premature atrial contractions and reports his heart rate can be erratic at times. He has follow up with cardiology in January but is trying to move up this appointment.   He denies seasonal allergies. He denies sinus congestion or nasal drainage. He complains of ear popping on left. Denies ear fullness.   He is a former smoker with 75 pack year history. He quit in 2006.   Past Medical History:  Diagnosis Date   AAA (abdominal aortic aneurysm)    AAA (abdominal aortic aneurysm)    AAA (abdominal aortic aneurysm)    Atherosclerosis    pt denies this   BPH (benign prostatic hyperplasia)    CKD (chronic kidney disease), stage III  (HCC)    family reports caused by scans but that has resolved since CT scans have stopped   Colon cancer ([place])    COPD (chronic obstructive pulmonary disease) ([place])    Diabetes mellitus    ED (erectile dysfunction)    Gastroesophageal reflux disease    Heart murmur    Hyperlipidemia    Hypertension    Lung nodules    Neuromuscular disorder ([place])    Neuropathy feet   Osteopenia    Peripheral vascular disease ([place])    Pneumonia June 2013   Positional vertigo    intermittent    Shortness of breath    chronic per patient   Stroke ([place])    x2 in 2006   Stroke [facility])    Thyroid cancer ([place])    Thyroid disease    Type 2 diabetes mellitus ([place])    Vitamin D deficiency      Family History  Problem Relation Age of Onset   Stomach cancer Mother    Heart failure Mother    Breast cancer Mother    Pneumonia Father    Diabetes Brother    Heart disease Brother        Heart Disease before age 43,  AAA and  Carotid   Heart attack Brother    Diabetes Paternal Uncle      Social History   Socioeconomic History   Marital status: Married    Spouse name: Not on file  Number of children: Not on file   Years of education: Not on file   Highest education level: Not on file  Occupational History   Occupation: Retired    Fish farm manager: RETIRED  Tobacco Use   Smoking status: Former    Packs/day: 1.50    Years: 50.00    Pack years: 75.00    Types: Cigarettes    Quit date: 05/28/2005    Years since quitting: 16.5   Smokeless tobacco: Never  Vaping Use   Vaping Use: Never used  Substance and Sexual Activity   Alcohol use: No    Alcohol/week: 0.0 standard drinks   Drug use: No   Sexual activity: Not Currently  Other Topics Concern   Not on file  Social History Narrative   Not on file   Social Determinants of Health   Financial Resource Strain: Not on file  Food Insecurity: Not on file  Transportation Needs: Not on file  Physical Activity: Not on file  Stress: Not on file   Social Connections: Not on file  Intimate Partner Violence: Not on file     Allergies  Allergen Reactions   Azithromycin Rash    zpack   Doxycycline Rash     Outpatient Medications Prior to Visit  Medication Sig Dispense Refill   acetaminophen (TYLENOL) 500 MG tablet Take 500-1,000 mg by mouth See admin instructions. Take 500 mg daily at bedtime and may take an additional 500mg  tablet if needed for pain     allopurinol (ZYLOPRIM) 100 MG tablet Take 150 mg by mouth daily.     clopidogrel (PLAVIX) 75 MG tablet Take 75 mg by mouth at bedtime.      COMBIVENT RESPIMAT 20-100 MCG/ACT AERS respimat Inhale 1 puff into the lungs in the morning and at bedtime.     diltiazem (CARDIZEM CD) 120 MG 24 hr capsule Take 1 capsule (120 mg total) by mouth daily. 90 capsule 3   fexofenadine (ALLEGRA) 180 MG tablet Take 180 mg by mouth at bedtime.     Fluticasone-Salmeterol (ADVAIR) 250-50 MCG/DOSE AEPB Inhale 1 puff into the lungs every 12 (twelve) hours.     HUMALOG KWIKPEN 100 UNIT/ML KwikPen Inject 4 Units into the skin See admin instructions. 4 units in the morning and 4 units late afternoon/supper time     ipratropium-albuterol (DUONEB) 0.5-2.5 (3) MG/3ML SOLN Take 3 mLs by nebulization 2 (two) times daily as needed for wheezing or shortness of breath.     LEVEMIR FLEXTOUCH 100 UNIT/ML Pen Inject 7 Units into the skin at bedtime.     levothyroxine (SYNTHROID) 100 MCG tablet 1 tablet in the morning Mon - Sat; take 1/2 tab on Sun. Take pill on an empty stomach     LORazepam (ATIVAN) 0.5 MG tablet Take 0.25-0.5 mg by mouth daily as needed for anxiety.     mirtazapine (REMERON SOL-TAB) 15 MG disintegrating tablet Take 15 mg by mouth at bedtime.     rosuvastatin (CRESTOR) 10 MG tablet Take 10 mg by mouth at bedtime.     valsartan-hydrochlorothiazide (DIOVAN-HCT) 80-12.5 MG tablet Take 1 tablet by mouth daily.     Vitamin D, Ergocalciferol, (DRISDOL) 50000 UNITS CAPS Take 50,000 Units by mouth every Monday.       levothyroxine (SYNTHROID) 100 MCG tablet Take 1 tablet (100 mcg total) by mouth daily before breakfast. 30 tablet 0   levothyroxine (SYNTHROID) 125 MCG tablet Take 0.5 tablets (62.5 mcg total) by mouth every Sunday. 10 tablet 0   Facility-Administered Medications  Prior to Visit  Medication Dose Route Frequency Provider Last Rate Last Admin   0.9 %  sodium chloride infusion  500 mL Intravenous Continuous Irene Shipper, MD        Review of Systems  Constitutional:  Negative for chills, fever, malaise/fatigue and weight loss.  HENT:  Negative for congestion, sinus pain and sore throat.   Eyes: Negative.   Respiratory:  Positive for cough, sputum production and shortness of breath. Negative for hemoptysis.   Cardiovascular:  Positive for palpitations. Negative for chest pain, orthopnea, claudication and leg swelling.  Gastrointestinal:  Negative for abdominal pain, heartburn, nausea and vomiting.  Genitourinary: Negative.   Musculoskeletal:  Negative for joint pain and myalgias.  Skin:  Negative for rash.  Neurological:  Negative for weakness.  Endo/Heme/Allergies: Negative.   Psychiatric/Behavioral: Negative.     Objective:   Vitals:   12/01/21 1046  BP: 114/68  Pulse: 83  SpO2: 97%  Weight: 171 lb (77.6 kg)  Height: 6' (1.829 m)    Physical Exam Constitutional:      General: He is not in acute distress. HENT:     Head: Normocephalic and atraumatic.  Eyes:     Extraocular Movements: Extraocular movements intact.     Conjunctiva/sclera: Conjunctivae normal.     Pupils: Pupils are equal, round, and reactive to light.  Cardiovascular:     Rate and Rhythm: Normal rate and regular rhythm.     Pulses: Normal pulses.     Heart sounds: Normal heart sounds. No murmur heard. Pulmonary:     Breath sounds: Rhonchi (right mid lung field) and rales (left base) present.  Abdominal:     General: Bowel sounds are normal.     Palpations: Abdomen is soft.  Musculoskeletal:      Right lower leg: No edema.     Left lower leg: No edema.  Lymphadenopathy:     Cervical: No cervical adenopathy.  Skin:    General: Skin is warm and dry.  Neurological:     General: No focal deficit present.     Mental Status: He is alert.  Psychiatric:        Mood and Affect: Mood normal.        Behavior: Behavior normal.        Thought Content: Thought content normal.        Judgment: Judgment normal.    CBC    Component Value Date/Time   WBC 9.2 11/14/2021 0232   RBC 3.30 (L) 11/14/2021 0232   HGB 10.9 (L) 11/14/2021 0232   HGB 14.1 01/17/2011 0844   HCT 31.6 (L) 11/14/2021 0232   HCT 40.6 01/17/2011 0844   PLT 192 11/14/2021 0232   PLT 152 01/17/2011 0844   MCV 95.8 11/14/2021 0232   MCV 95.3 01/17/2011 0844   MCH 33.0 11/14/2021 0232   MCHC 34.5 11/14/2021 0232   RDW 13.4 11/14/2021 0232   RDW 13.6 01/17/2011 0844   LYMPHSABS 0.4 (L) 11/14/2021 0232   LYMPHSABS 0.9 01/17/2011 0844   MONOABS 0.5 11/14/2021 0232   MONOABS 0.7 01/17/2011 0844   EOSABS 0.0 11/14/2021 0232   EOSABS 0.1 01/17/2011 0844   BASOSABS 0.0 11/14/2021 0232   BASOSABS 0.0 01/17/2011 0844   BMP Latest Ref Rng & Units 11/14/2021 11/13/2021 11/12/2021  Glucose 70 - 99 mg/dL 191(H) 230(H) 221(H)  BUN 8 - 23 mg/dL 58(H) 59(H) 64(H)  Creatinine 0.61 - 1.24 mg/dL 1.74(H) 1.68(H) 1.83(H)  Sodium 135 - 145 mmol/L 139 137  137  Potassium 3.5 - 5.1 mmol/L 4.2 4.2 4.4  Chloride 98 - 111 mmol/L 110 106 106  CO2 22 - 32 mmol/L 21(L) 22 23  Calcium 8.9 - 10.3 mg/dL 8.5(L) 8.5(L) 8.8(L)   Chest imaging: CXR 11/12/21 Large lung volumes. Emphysema on CT. Vague, peripheral increased bilateral pulmonary opacity is stable from the the portable chest 2 days ago. No pneumothorax or pleural effusion. Stable cardiac size and mediastinal contours. Visualized tracheal air column is within normal limits. No acute osseous abnormality identified. Small surgical clips at the thoracic inlet from  thyroidectomy.  CT Chest 11/10/21 Slight progression of the pulmonary emphysema pattern with bilateral lower lobe peripheral patchy ground-glass mild opacities suggesting mild pneumonitis/viral pneumonia.   Stable calcified and noncalcified pulmonary nodules as detailed above.   Aortic and native coronary atherosclerosis.   Aortic Atherosclerosis (ICD10-I70.0) and Emphysema (ICD10-J43.9).  PFT: No flowsheet data found.  Labs:  Path:  Echo 11/10/21: LV EF 60-65%. RV systolic function and size are normal. LA size is severely dilated. RA moderately dilated.   Heart Catheterization:  Assessment & Plan:   Centrilobular emphysema ([place])  Acute respiratory failure with hypoxia (HCC)  COVID-19 virus infection  Community acquired pneumonia, unspecified laterality  Discussion: David [name] is an 87 year old male, former smoker with DMII, hypertension, CVA, and COPD who is referred to pulmonary clinic for pneumonia.   Review of patient's chest imaging from 2019 until most recently during his hospitalization 11/10/2021 he has diffuse emphysematous changes with some subpleural reticulation and fibrosis that appears to have progressed with his recent COVID-19 infection.  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  The progression of these fibrotic areas is likely responsible for his new oxygen requirements and respiratory failure.  Patient is to continue Advair 250-50 MCG 1 puff twice daily along with as needed Combivent and DuoNeb nebulizer treatments.  He is to use the flutter valve after each nebulizer treatment for airway clearance.  We will order him humidification for his home oxygen given the recent episodes of epistaxis.  Also recommended Ayr saline gel to be applied to each nostril at night.  He can also use saline nasal spray as needed.  We will follow patient closely in 1 month to monitor his symptoms.  We will also recheck him for need of  supplemental oxygen then.  Freda Jackson, MD [facility] Office: [phone]   Current Outpatient Medications:    acetaminophen (TYLENOL) 500 MG tablet, Take 500-1,000 mg by mouth See admin instructions. Take 500 mg daily at bedtime and may take an additional 500mg  tablet if needed for pain, Disp: , Rfl:    allopurinol (ZYLOPRIM) 100 MG tablet, Take 150 mg by mouth daily., Disp: , Rfl:    clopidogrel (PLAVIX) 75 MG tablet, Take 75 mg by mouth at bedtime. , Disp: , Rfl:    COMBIVENT RESPIMAT 20-100 MCG/ACT AERS respimat, Inhale 1 puff into the lungs in the morning and at bedtime., Disp: , Rfl:    diltiazem (CARDIZEM CD) 120 MG 24 hr capsule, Take 1 capsule (120 mg total) by mouth daily., Disp: 90 capsule, Rfl: 3   fexofenadine (ALLEGRA) 180 MG tablet, Take 180 mg by mouth at bedtime., Disp: , Rfl:    Fluticasone-Salmeterol (ADVAIR) 250-50 MCG/DOSE AEPB, Inhale 1 puff into the lungs every 12 (twelve) hours., Disp: , Rfl:    HUMALOG KWIKPEN 100 UNIT/ML KwikPen, Inject 4 Units into the skin See admin instructions. 4 units  in the morning and 4 units late afternoon/supper time, Disp: , Rfl:    ipratropium-albuterol (DUONEB) 0.5-2.5 (3) MG/3ML SOLN, Take 3 mLs by nebulization 2 (two) times daily as needed for wheezing or shortness of breath., Disp: , Rfl:    LEVEMIR FLEXTOUCH 100 UNIT/ML Pen, Inject 7 Units into the skin at bedtime., Disp: , Rfl:    levothyroxine (SYNTHROID) 100 MCG tablet, 1 tablet in the morning Mon - Sat; take 1/2 tab on Sun. Take pill on an empty stomach, Disp: , Rfl:    LORazepam (ATIVAN) 0.5 MG tablet, Take 0.25-0.5 mg by mouth daily as needed for anxiety., Disp: , Rfl:    mirtazapine (REMERON SOL-TAB) 15 MG disintegrating tablet, Take 15 mg by mouth at bedtime., Disp: , Rfl:    rosuvastatin (CRESTOR) 10 MG tablet, Take 10 mg by mouth at bedtime., Disp: , Rfl:    valsartan-hydrochlorothiazide (DIOVAN-HCT) 80-12.5 MG tablet, Take 1 tablet by mouth daily.,  Disp: , Rfl:    Vitamin D, Ergocalciferol, (DRISDOL) 50000 UNITS CAPS, Take 50,000 Units by mouth every Monday. , Disp: , Rfl:   Current Facility-Administered Medications:    0.9 %  sodium chloride infusion, 500 mL, Intravenous, Continuous, Irene Shipper, MD

## 2021-12-01 NOTE — Patient Instructions (Addendum)
It looks like there is progressive areas of scarring on your lung as we reviewed on your CT scans.   Continue advair inhaler twice daily  Continue PRN combivent and duoneb nebulizer treatments.  Use flutter valve after each nebulizer treatment for mucous clearance.   Use Ayr Saline gel in each nostril to avoid dry nasal passages and nose bleeds.   We will order you humidification for your home oxygen  We will see you in 1 month for close follow up

## 2021-12-04 ENCOUNTER — Telehealth: Payer: Self-pay | Admitting: Pulmonary Disease

## 2021-12-04 NOTE — Telephone Encounter (Signed)
disregard

## 2021-12-06 NOTE — Progress Notes (Signed)
Cardiology Office Note:    Date:  12/07/2021   ID:  David [name], DOB 1931/04/15, MRN [id number]  PCP:  [name], MD   [place]  Cardiologist:  Werner Lean, MD  Saw Dr. Acie Fredrickson 2015. Advanced Practice Provider:  No care team member to display Electrophysiologist:  None      Referring MD: [name], MD   CC: follow up   History of Present Illness:    David [name] is a 87 y.o. 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  In interim of this visit, patient had mild AS and normal LV function.  Had frequent PVCs and started diltiazem.  Seen 12/07/21.  Patient notes that he is doing OK, except for the last couple of days.  Had COVID-19, then PNA.  Had thyroid dysregulation post hospitalization.   Notes new chest pressure and spasm.  Had chest pressure on exertion that improves with rest. This is a new finding.  No radiation of the pressure.  This is different that with his funny heart beats  Wife notes they are seeing pulmonology.  Notes that his breathing has improved and he has no wheezing.   No problems with prior stress test   Past Medical History:  Diagnosis Date   AAA (abdominal aortic aneurysm)    AAA (abdominal aortic aneurysm)    AAA (abdominal aortic aneurysm)    Atherosclerosis    pt denies this   BPH (benign prostatic hyperplasia)    CKD (chronic kidney disease), stage III (HCC)    family reports caused by scans but that has resolved since CT scans have stopped   Colon cancer ([place])    COPD (chronic obstructive pulmonary disease) ([place])    Diabetes mellitus    ED (erectile dysfunction)    Gastroesophageal reflux disease    Heart murmur    Hyperlipidemia    Hypertension    Lung nodules    Neuromuscular disorder ([place])    Neuropathy feet    Osteopenia    Peripheral vascular disease ([place])    Pneumonia June 2013   Positional vertigo    intermittent    Shortness of breath    chronic per patient   Stroke [facility]    x2 in 2006   Stroke [facility])    Thyroid cancer ([place])    Thyroid disease    Type 2 diabetes mellitus ([place])    Vitamin D deficiency     Past Surgical History:  Procedure Laterality Date   ABDOMINAL AORTIC ANEURYSM REPAIR N/A 11/08/2014   Procedure: ANEURYSM ABDOMINAL AORTIC REPAIR;  Surgeon: Rosetta Posner, MD;  Location: [facility];  Service: Vascular;  Laterality: N/A;   ABDOMINAL AORTIC ANEURYSM REPAIR     COLON RESECTION  2007   COLON SURGERY  2006   Resection   CYSTOSCOPY W/ RETROGRADES Bilateral 12/30/2020   Procedure: CYSTOSCOPY WITH BILATERAL RETROGRADE;  Surgeon: Irine Seal, MD;  Location: [facility];  Service: Urology;  Laterality: Bilateral;   CYSTOSCOPY WITH BIOPSY N/A 12/30/2020   Procedure: CYSTOSCOPY WITH BIOPSY AND FULGURATION;  Surgeon: Irine Seal, MD;  Location: [facility];  Service: Urology;  Laterality: N/A;   SEPTOPLASTY     THYROIDECTOMY  2007   TRANSURETHRAL RESECTION OF BLADDER TUMOR WITH MITOMYCIN-C N/A 02/12/2017   Procedure: TRANSURETHRAL RESECTION OF BLADDER  TUMOR;  Surgeon: Irine Seal, MD;  Location: [facility];  Service: Urology;  Laterality: N/A;    Current Medications: Current Meds  Medication Sig   acetaminophen (TYLENOL) 500 MG tablet Take 500-1,000 mg by mouth See admin instructions. Take 500 mg daily at bedtime and may take an additional 500mg  tablet if needed for pain   allopurinol (ZYLOPRIM) 100 MG tablet Take 150 mg by mouth daily.   clopidogrel (PLAVIX) 75 MG tablet Take 75 mg by mouth at bedtime.    COMBIVENT RESPIMAT 20-100 MCG/ACT AERS respimat Inhale 1 puff into the lungs in the morning and at bedtime.   diltiazem (CARDIZEM CD) 120 MG 24 hr capsule Take 1 capsule (120 mg total) by mouth daily.   fexofenadine (ALLEGRA) 180 MG tablet Take 180 mg by mouth at bedtime.   Fluticasone-Salmeterol  (ADVAIR) 250-50 MCG/DOSE AEPB Inhale 1 puff into the lungs every 12 (twelve) hours.   HUMALOG KWIKPEN 100 UNIT/ML KwikPen Inject 4 Units into the skin See admin instructions. 4 units in the morning and 4 units late afternoon/supper time   ipratropium-albuterol (DUONEB) 0.5-2.5 (3) MG/3ML SOLN Take 3 mLs by nebulization 2 (two) times daily as needed for wheezing or shortness of breath.   LEVEMIR FLEXTOUCH 100 UNIT/ML Pen Inject 7 Units into the skin at bedtime.   levothyroxine (SYNTHROID) 100 MCG tablet 1 tablet in the morning Mon - Sat; take 1/2 tab on Sun. Take pill on an empty stomach   LORazepam (ATIVAN) 0.5 MG tablet Take 0.25-0.5 mg by mouth daily as needed for anxiety.   mirtazapine (REMERON SOL-TAB) 15 MG disintegrating tablet Take 15 mg by mouth at bedtime.   nitroGLYCERIN (NITROSTAT) 0.4 MG SL tablet Place 1 tablet (0.4 mg total) under the tongue every 5 (five) minutes as needed for chest pain.   rosuvastatin (CRESTOR) 10 MG tablet Take 10 mg by mouth at bedtime.   valsartan-hydrochlorothiazide (DIOVAN-HCT) 80-12.5 MG tablet Take 1 tablet by mouth daily.   Vitamin D, Ergocalciferol, (DRISDOL) 50000 UNITS CAPS Take 50,000 Units by mouth every Monday.    Current Facility-Administered Medications for the 12/07/21 encounter (Office Visit) with Rudean Haskell A, MD  Medication   0.9 %  sodium chloride infusion     Allergies:   Azithromycin and Doxycycline   Social History   Socioeconomic History   Marital status: Married    Spouse name: Not on file   Number of children: Not on file   Years of education: Not on file   Highest education level: Not on file  Occupational History   Occupation: Retired    Fish farm manager: RETIRED  Tobacco Use   Smoking status: Former    Packs/day: 1.50    Years: 50.00    Pack years: 75.00    Types: Cigarettes    Quit date: 05/28/2005    Years since quitting: 16.5   Smokeless tobacco: Never  Vaping Use   Vaping Use: Never used  Substance and  Sexual Activity   Alcohol use: No    Alcohol/week: 0.0 standard drinks   Drug use: No   Sexual activity: Not Currently  Other Topics Concern   Not on file  Social History Narrative   Not on file   Social Determinants of Health   Financial Resource Strain: Not on file  Food Insecurity: Not on file  Transportation Needs: Not on file  Physical Activity: Not on file  Stress: Not on file  Social Connections: Not on file     Family History: The patient's  family history includes Breast cancer in his mother; Diabetes in his brother and paternal uncle; Heart attack in his brother; Heart disease in his brother; Heart failure in his mother; Pneumonia in his father; Stomach cancer in his mother.  ROS:   Please see the history of present illness.    All other systems reviewed and are negative.  EKGs/Labs/Other Studies Reviewed:    The following studies were reviewed today:  EKG:  EKG is  ordered today.  The ekg ordered today demonstrates  12/07/21: SR rate 81 1st HB PACs with no PVCs 04/24/21: SR rate 71 1st HB no PVCs 04/17/21: SR rate 63 with PACs, and PVCs  Cardiac Event Monitoring: Date: 05/19/21 Results: Patient had a minimum heart rate of 21 bpm, maximum heart rate of 203 bpm, and average heart rate of 64 bpm. Predominant underlying rhythm was Sinus Rhythm. Two runs of non-sustained ventricular tachycardia occurred lasting 7 beats at longest with a max rate of 203 bpm at fastest. Two runs of supraventricular tachycardia occurred lasting 8 beats at longest with a max rate of 143 bpm at fastest. Isolated PACs were rare (<1.0%), with rare couplets and triplets present. Isolated PVCs were frequent (7.7% [id number]), with rare couplets and triplets present. First Degree AV Block was present, in addition there were 2 nocturnal pauses that occurred (2:1 HB and high grade AV block) with the longest lasting 3.8 secs. Triggered and diary events associated with sinus rhythm and PVCs.   Frequent  PVCs with rare nocturnal heart block  Transthoracic Echocardiogram: Date: 11/10/21 Results:  1. Left ventricular ejection fraction, by estimation, is 60 to 65%. The  left ventricle has normal function. The left ventricle has no regional  wall motion abnormalities. Left ventricular diastolic function could not  be evaluated.   2. Right ventricular systolic function is normal. The right ventricular  size is normal.   3. Left atrial size was severely dilated.   4. Right atrial size was moderately dilated.   5. The mitral valve is normal in structure. No evidence of mitral valve  regurgitation. No evidence of mitral stenosis.   6. The aortic valve is normal in structure. There is mild calcification  of the aortic valve. There is mild thickening of the aortic valve. Aortic  valve regurgitation is not visualized. Mild aortic valve stenosis. Aortic  valve area, by VTI measures 1.86  cm. Aortic valve mean gradient measures 11.0 mmHg. Aortic valve Vmax  measures 2.25 m/s.   7. The inferior vena cava is dilated in size with >50% respiratory  variability, suggesting right atrial pressure of 8 mmHg.     Non Cardiac CT: Date: 08/17/20 Results: Significant aortic atherosclerosis CAC in LAD and LCx   NM Stress Testing: Date: 10/08/2014 Results: Low risk myoveiw   Recent Labs: 11/12/2021: TSH 0.058 11/14/2021: ALT 53; B Natriuretic Peptide 157.2; BUN 58; Creatinine, Ser 1.74; Hemoglobin 10.9; Magnesium 2.2; Platelets 192; Potassium 4.2; Sodium 139  Recent Lipid Panel    Component Value Date/Time   CHOL 92 06/14/2012 0725   TRIG 66 06/14/2012 0725   HDL 34 (L) 06/14/2012 0725   CHOLHDL 2.7 06/14/2012 0725   VLDL 13 06/14/2012 0725   LDLCALC 45 06/14/2012 0725    Physical Exam:    VS:  BP 122/62    Pulse 81    Ht 6' (1.829 m)    Wt 176 lb (79.8 kg)    SpO2 96%    BMI 23.87 kg/m     Wt Readings  from Last 3 Encounters:  12/07/21 176 lb (79.8 kg)  12/01/21 171 lb (77.6 kg)   11/14/21 172 lb 9.6 oz (78.3 kg)     Gen: No distress, elderly male   Neck: No JVD, no carotid bruit Ears: R ear Frank Sign Cardiac: No Rubs or Gallops, no Murmur, IRIRI rhythm +2 radial pulses Respiratory: Clear to auscultation bilaterally, normal effort, normal  respiratory rate (no wheezing) GI: Soft, nontender, non-distended  MS: No  edema;  moves all extremities Integument: Skin feels warm Neuro:  At time of evaluation, alert and oriented to person/place/time/situation; hard of hearing L ear Psych: Normal affect, patient feels OK presently   ASSESSMENT:    1. Chest pain of uncertain etiology   2. PAC (premature atrial contraction)   3. Hypertension associated with diabetes ([place])   4. Stage 3b chronic kidney disease ([place])     PLAN:    Chest pain HTN with DM Aortic atherosclerosis HLD with DM PAD and AAA s/p repair- sees VVS Mild TAA- 4.1 cm at stable Mild AS CKD Stage IIIb Prior Stroke -  with no CP, consented patient for stress test (lexiscan), started PRN nitro, can increas to IMDUR 30 if needed, we discussed red flag symptoms for ED eval - continue plavix, on rosuvastatin 10 mg LDL < 55 - continue Diovan-HCT 80/12.5 - has been released from TCT given age and stability of the AA; reasonable and will not make further changes - Echo in one-two years for AS  PVCs and PACs- asymptomatic COPD Thyroid dysregulation - at f/u if still having symptoms will repeat monitor and may increase diltiazem  6-8 weeks me or APP    Medication Adjustments/Labs and Tests Ordered: Current medicines are reviewed at length with the patient today.  Concerns regarding medicines are outlined above.  Orders Placed This Encounter  Procedures   Cardiac Stress Test: Informed Consent Details: Physician/Practitioner Attestation; Transcribe to consent form and obtain patient signature   MYOCARDIAL PERFUSION IMAGING   EKG 12-Lead    Meds ordered this encounter  Medications    nitroGLYCERIN (NITROSTAT) 0.4 MG SL tablet    Sig: Place 1 tablet (0.4 mg total) under the tongue every 5 (five) minutes as needed for chest pain.    Dispense:  25 tablet    Refill:  3     Patient Instructions  Medication Instructions:  1)  A prescription has been sent in for Nitroglycerin.  If you have chest pain that doesn't relieve quickly, place one tablet under your tongue and allow it to dissolve.  If no relief after 5 minutes, you may take another pill.  If no relief after 5 minutes, you may take a 3rd dose but you need to call 911 and report to ER immediately.  *If you need a refill on your cardiac medications before your next appointment, please call your pharmacy*   Lab Work: None If you have labs (blood work) drawn today and your tests are completely normal, you will receive your results only by: [place] (if you have MyChart) OR A paper copy in the mail If you have any lab test that is abnormal or we need to change your treatment, we will call you to review the results.   Testing/Procedures: Your physician has requested that you have a lexiscan myoview. For further information please visit [email or web address]. Please follow instruction sheet, as given.   Follow-Up: At [facility], you and your health needs are our priority.  As part of our continuing mission  to provide you with exceptional heart care, we have created designated Provider Care Teams.  These Care Teams include your primary Cardiologist (physician) and Advanced Practice Providers (APPs -  Physician Assistants and Nurse Practitioners) who all work together to provide you with the care you need, when you need it.  We recommend signing up for the patient portal called "MyChart".  Sign up information is provided on this After Visit Summary.  MyChart is used to connect with patients for Virtual Visits (Telemedicine).  Patients are able to view lab/test results, encounter notes, upcoming appointments, etc.   Non-urgent messages can be sent to your provider as well.   To learn more about what you can do with MyChart, go to [email or web address].    Your next appointment:   6-8 week(s)  The format for your next appointment:   In Person  Provider:   Werner Lean, MD     Other Instructions     Signed, Werner Lean, MD  12/07/2021 10:20 AM    [place]

## 2021-12-07 ENCOUNTER — Ambulatory Visit: Payer: Medicare Other | Admitting: Internal Medicine

## 2021-12-07 ENCOUNTER — Encounter: Payer: Self-pay | Admitting: Internal Medicine

## 2021-12-07 ENCOUNTER — Other Ambulatory Visit: Payer: Self-pay

## 2021-12-07 ENCOUNTER — Encounter: Payer: Self-pay | Admitting: *Deleted

## 2021-12-07 VITALS — BP 122/62 | HR 81 | Ht 72.0 in | Wt 176.0 lb

## 2021-12-07 DIAGNOSIS — E1159 Type 2 diabetes mellitus with other circulatory complications: Secondary | ICD-10-CM | POA: Diagnosis not present

## 2021-12-07 DIAGNOSIS — I491 Atrial premature depolarization: Secondary | ICD-10-CM

## 2021-12-07 DIAGNOSIS — N1832 Chronic kidney disease, stage 3b: Secondary | ICD-10-CM | POA: Diagnosis not present

## 2021-12-07 DIAGNOSIS — I152 Hypertension secondary to endocrine disorders: Secondary | ICD-10-CM

## 2021-12-07 DIAGNOSIS — R079 Chest pain, unspecified: Secondary | ICD-10-CM | POA: Diagnosis not present

## 2021-12-07 MED ORDER — NITROGLYCERIN 0.4 MG SL SUBL: 0.4000 mg | SUBLINGUAL_TABLET | SUBLINGUAL | 3 refills | Status: DC | PRN

## 2021-12-07 NOTE — Patient Instructions (Signed)
Medication Instructions:  1)  A prescription has been sent in for Nitroglycerin.  If you have chest pain that doesn't relieve quickly, place one tablet under your tongue and allow it to dissolve.  If no relief after 5 minutes, you may take another pill.  If no relief after 5 minutes, you may take a 3rd dose but you need to call 911 and report to ER immediately.  *If you need a refill on your cardiac medications before your next appointment, please call your pharmacy*   Lab Work: None If you have labs (blood work) drawn today and your tests are completely normal, you will receive your results only by: [place] (if you have MyChart) OR A paper copy in the mail If you have any lab test that is abnormal or we need to change your treatment, we will call you to review the results.   Testing/Procedures: Your physician has requested that you have a lexiscan myoview. For further information please visit [email or web address]. Please follow instruction sheet, as given.   Follow-Up: At [facility], you and your health needs are our priority.  As part of our continuing mission to provide you with exceptional heart care, we have created designated Provider Care Teams.  These Care Teams include your primary Cardiologist (physician) and Advanced Practice Providers (APPs -  Physician Assistants and Nurse Practitioners) who all work together to provide you with the care you need, when you need it.  We recommend signing up for the patient portal called "MyChart".  Sign up information is provided on this After Visit Summary.  MyChart is used to connect with patients for Virtual Visits (Telemedicine).  Patients are able to view lab/test results, encounter notes, upcoming appointments, etc.  Non-urgent messages can be sent to your provider as well.   To learn more about what you can do with MyChart, go to [email or web address].    Your next appointment:   6-8 week(s)  The format for your next  appointment:   In Person  Provider:   Werner Lean, MD     Other Instructions

## 2021-12-19 ENCOUNTER — Telehealth (HOSPITAL_COMMUNITY): Payer: Self-pay | Admitting: *Deleted

## 2021-12-19 NOTE — Telephone Encounter (Signed)
Left message on voicemail per DPR in reference to upcoming appointment scheduled on 12/26/21 at 1000 with detailed instructions given per Myocardial Perfusion Study Information Sheet for the test. LM to arrive 15 minutes early, and that it is imperative to arrive on time for appointment to keep from having the test rescheduled. If you need to cancel or reschedule your appointment, please call the office within 24 hours of your appointment. Failure to do so may result in a cancellation of your appointment, and a $50 no show fee. Phone number given for call back for any questions.  No mychart available.Ioma Chismar, [place]

## 2021-12-20 ENCOUNTER — Other Ambulatory Visit: Payer: Self-pay

## 2021-12-20 ENCOUNTER — Ambulatory Visit: Payer: Medicare Other

## 2021-12-20 ENCOUNTER — Encounter: Payer: Self-pay | Admitting: Podiatry

## 2021-12-20 ENCOUNTER — Ambulatory Visit: Payer: Medicare Other | Admitting: Podiatry

## 2021-12-20 DIAGNOSIS — M2011 Hallux valgus (acquired), right foot: Secondary | ICD-10-CM

## 2021-12-20 DIAGNOSIS — M2041 Other hammer toe(s) (acquired), right foot: Secondary | ICD-10-CM | POA: Diagnosis not present

## 2021-12-20 DIAGNOSIS — M216X2 Other acquired deformities of left foot: Secondary | ICD-10-CM

## 2021-12-20 DIAGNOSIS — B351 Tinea unguium: Secondary | ICD-10-CM | POA: Diagnosis not present

## 2021-12-20 DIAGNOSIS — Q828 Other specified congenital malformations of skin: Secondary | ICD-10-CM

## 2021-12-20 DIAGNOSIS — E1142 Type 2 diabetes mellitus with diabetic polyneuropathy: Secondary | ICD-10-CM

## 2021-12-20 DIAGNOSIS — M79676 Pain in unspecified toe(s): Secondary | ICD-10-CM | POA: Diagnosis not present

## 2021-12-20 DIAGNOSIS — M2042 Other hammer toe(s) (acquired), left foot: Secondary | ICD-10-CM | POA: Diagnosis not present

## 2021-12-20 DIAGNOSIS — D689 Coagulation defect, unspecified: Secondary | ICD-10-CM

## 2021-12-20 DIAGNOSIS — M216X1 Other acquired deformities of right foot: Secondary | ICD-10-CM

## 2021-12-20 DIAGNOSIS — M2012 Hallux valgus (acquired), left foot: Secondary | ICD-10-CM | POA: Diagnosis not present

## 2021-12-20 NOTE — Progress Notes (Signed)
SITUATION Reason for Visit: Fitting of Diabetic Shoes & Insoles Patient / Caregiver Report:  Patient is satisfied with fit and function  OBJECTIVE DATA: Patient History / Diagnosis:     ICD-10-CM   1. Diabetic polyneuropathy associated with type 2 diabetes mellitus (HCC)  E11.42     2. Prominent metatarsal head of left foot  M21.6X2     3. Prominent metatarsal head of right foot  M21.6X1       Change in Status:   None  ACTIONS PERFORMED: In-Person Delivery, patient was fit with: - 1x pair [id number] PDAC approved prefabricated Diabetic Shoes: Orthofeet oxfords - 3x pair [id number] PDAC approved CAM milled custom diabetic insoles  Shoes and insoles were verified for structural integrity and safety. Patient wore shoes and insoles in office. Skin was inspected and free of areas of concern after wearing shoes and inserts. Shoes and inserts fit properly. Patient / Caregiver provided with ferbal instruction and demonstration regarding donning, doffing, wear, care, proper fit, function, purpose, cleaning, and use of shoes and insoles ' and in all related precautions and risks and benefits regarding shoes and insoles. Patient / Caregiver was instructed to wear properly fitting socks with shoes at all times. Patient was also provided with verbal instruction regarding how to report any failures or malfunctions of shoes or inserts, and necessary follow up care. Patient / Caregiver was also instructed to contact physician regarding change in status that may affect function of shoes and inserts.   Patient / Caregiver verbalized undersatnding of instruction provided. Patient / Caregiver demonstrated independence with proper donning and doffing of shoes and inserts.  PLAN Patient to follow up as needed. Plan of care was discussed with and agreed upon by patient and/or caregiver. All questions were answered and concerns addressed.

## 2021-12-20 NOTE — Progress Notes (Signed)
This patient returns to my office for at risk foot care.  This patient requires this care by a professional since this patient will be at risk due to having chronic kidney disease, thrombocytopenia and type 2 diabetes.   Patient is taking plavix.    This patient is unable to cut nails himself since the patient cannot reach his nails.These nails are painful walking and wearing shoes.   This patient presents for at risk foot care today.   General Appearance  Alert, conversant and in no acute stress.  Vascular  Dorsalis pedis and posterior tibial  pulses are weakly  palpable  bilaterally.  Capillary return is within normal limits  bilaterally. Temperature is within normal limits  Bilaterally.  Venous stasis  B/L.  Neurologic  Senn-Weinstein monofilament wire test absent   bilaterally. Muscle power within normal limits bilaterally.  Nails Thick disfigured discolored nails with subungual debris  from hallux to fifth toes bilaterally. No evidence of bacterial infection or drainage bilaterally.  Orthopedic  No limitations of motion  feet .  No crepitus or effusions noted.  HAV  B/L.  Hammer toes  B/L.  Exostosis IPJ left hallux.  Skin  normotropic skin with no porokeratosis noted bilaterally.  No signs of infections or ulcers noted.   Porokeratosis sub 5th  B/L.  Onychomycosis  Pain in right toes  Pain in left toes  Porokeratosis  B/L.  Consent was obtained for treatment procedures.   Mechanical debridement of nails 1-5  bilaterally performed with a nail nipper.  Filed with dremel without incident.  Patient received his diabetic shoes prior to this visit.  Aaron Edelman entered the room during debridement of porokeratosis sub 5th met and I debrided too deep into his porokeratosis sub 5th right.  Neosporin/DSD applied.  Aaron Edelman then returned with cut out of diabetic insoles.  Home care instructions discussed.   Call to office as needed.   Return office visit   10 weeks                  Told patient to return for  periodic foot care and evaluation due to potential at risk complications.   Gardiner Barefoot DPM

## 2021-12-21 ENCOUNTER — Telehealth: Payer: Self-pay | Admitting: Podiatry

## 2021-12-21 NOTE — Telephone Encounter (Signed)
Called patient at home following deep reduction of callus right foot.  He states the bandage that was applied in the office had moved and his sock showed signs of blood.  He cleaned the wound and applied steri-strip at the site of the skin break.  The site was then bandaged with big band-aid.  He says it is uncomfortable upon weight-bearing.  He says he will check the skin break site and wash it with peroxide.  I offered to examine the site tomorrow if he desires.  He says he will check the site later and will call if he needs to be seen. Told him we need to prevent an infection.   Gardiner Barefoot DPM

## 2021-12-26 ENCOUNTER — Other Ambulatory Visit: Payer: Self-pay

## 2021-12-26 ENCOUNTER — Ambulatory Visit (HOSPITAL_COMMUNITY): Payer: Medicare Other | Attending: Internal Medicine

## 2021-12-26 DIAGNOSIS — R079 Chest pain, unspecified: Secondary | ICD-10-CM | POA: Diagnosis present

## 2021-12-26 LAB — MYOCARDIAL PERFUSION IMAGING
LV dias vol: 96 mL (ref 62–150)
LV sys vol: 40 mL
Nuc Stress EF: 58 %
Peak HR: 74 {beats}/min
Rest HR: 55 {beats}/min
Rest Nuclear Isotope Dose: 10.8 mCi
SDS: 1
SRS: 0
SSS: 1
ST Depression (mm): 0 mm
Stress Nuclear Isotope Dose: 33 mCi
TID: 1.04

## 2021-12-26 MED ORDER — TECHNETIUM TC 99M TETROFOSMIN IV KIT
10.8000 | PACK | Freq: Once | INTRAVENOUS | Status: AC | PRN
  Administered 2021-12-26: 10.8 via INTRAVENOUS
  Filled 2021-12-26: qty 11

## 2021-12-26 MED ORDER — TECHNETIUM TC 99M TETROFOSMIN IV KIT
33.0000 | PACK | Freq: Once | INTRAVENOUS | Status: AC | PRN
  Administered 2021-12-26: 33 via INTRAVENOUS
  Filled 2021-12-26: qty 33

## 2021-12-26 MED ORDER — REGADENOSON 0.4 MG/5ML IV SOLN
0.4000 mg | Freq: Once | INTRAVENOUS | Status: AC
  Administered 2021-12-26: 0.4 mg via INTRAVENOUS

## 2021-12-28 ENCOUNTER — Telehealth: Payer: Self-pay

## 2021-12-28 MED ORDER — DILTIAZEM HCL ER COATED BEADS 240 MG PO CP24: 240.0000 mg | ORAL_CAPSULE | Freq: Every day | ORAL | 3 refills | Status: DC

## 2021-12-28 NOTE — Telephone Encounter (Signed)
-----   Message from Werner Lean, MD sent at 12/27/2021 11:02 AM EST ----- Results: No changes from prior stress test. Plan: If further spasm, we will increase diltiazem to 240 mg from his 120 Has 01/19/22 f/u with me  Werner Lean, MD

## 2021-12-28 NOTE — Telephone Encounter (Signed)
The patient and wife have been notified of the result and verbalized understanding.  All questions (if any) were answered. Brevan Luberto N Zach Tietje, RN 12/28/2021 11:31 AM   Pt reports continued spasms order placed for diltiazem 240 mg PO QD.  Spouse articulated monitoring BP and pt for weakness and will call the office with any concerns.

## 2021-12-29 ENCOUNTER — Ambulatory Visit: Payer: Medicare Other | Admitting: Pulmonary Disease

## 2021-12-29 ENCOUNTER — Encounter: Payer: Self-pay | Admitting: Pulmonary Disease

## 2021-12-29 ENCOUNTER — Other Ambulatory Visit: Payer: Self-pay

## 2021-12-29 VITALS — BP 124/70 | HR 60 | Ht 72.0 in | Wt 178.2 lb

## 2021-12-29 DIAGNOSIS — J432 Centrilobular emphysema: Secondary | ICD-10-CM | POA: Diagnosis not present

## 2021-12-29 DIAGNOSIS — K219 Gastro-esophageal reflux disease without esophagitis: Secondary | ICD-10-CM

## 2021-12-29 DIAGNOSIS — J9601 Acute respiratory failure with hypoxia: Secondary | ICD-10-CM

## 2021-12-29 MED ORDER — FAMOTIDINE 20 MG PO TABS: 20.0000 mg | ORAL_TABLET | Freq: Every day | ORAL | 0 refills | Status: AC

## 2021-12-29 NOTE — Progress Notes (Signed)
Synopsis: Referred in December 2022 for Pneumonia by Claud Kelp, NP  Subjective:   PATIENT ID: David [name] GENDER: male DOB: [birth year], MRN: [id number]  HPI  Chief Complaint  Patient presents with   Follow-up    1 mo f/u. States he is still coughing up thick, clear phlegm. Breathing has been stable, still on 2L of O2.    David [name] is an 88 year old male, former smoker with DMII, hypertension, CVA, and COPD who returns to pulmonary clinic for pneumonia and emphysema.   He has been doing well since last visit. He complains of chest congestion that is sometimes difficult to cough up. He is using nebulizer treatments and flutter valve which do help. He denies sinus congestion or drainage. He does have reflux symptoms.   He continues to use 2L of supplemental oxygen. He is participating in home physical therapy.  OV 12/01/21 He was admitted 11/18 to 11/22 for acute respiratory failure and pneumonia. He tested positive for covid on 11/15. He was treated with short course of antibiotics and steroids. He was discharged on supplemental O2, 1L at rest and 2L with ambulation.   Patient is feeling somewhat better since hospitalization but he continues to have cough with clear sputum production and exertional shortness of breath.  He has also noticed mild intermittent epistaxis since starting oxygen therapy.  He is using Advair 250-50 MCG 1 puff twice daily, Combivent a couple times a day and DuoNeb nebulizer treatments a couple times per day.  He has flutter valve from hospital that he is using throughout the day as well. He completed course of levaquin yesterday.   Patient reports history of premature ventricular contractions as well as premature atrial contractions and reports his heart rate can be erratic at times. He has follow up with cardiology in January but is trying to move up this appointment.   He denies seasonal allergies. He denies sinus congestion or nasal drainage. He  complains of ear popping on left. Denies ear fullness.   He is a former smoker with 75 pack year history. He quit in 2006.   Past Medical History:  Diagnosis Date   AAA (abdominal aortic aneurysm)    AAA (abdominal aortic aneurysm)    AAA (abdominal aortic aneurysm)    Atherosclerosis    pt denies this   BPH (benign prostatic hyperplasia)    CKD (chronic kidney disease), stage III (HCC)    family reports caused by scans but that has resolved since CT scans have stopped   Colon cancer ([place])    COPD (chronic obstructive pulmonary disease) ([place])    Diabetes mellitus    ED (erectile dysfunction)    Gastroesophageal reflux disease    Heart murmur    Hyperlipidemia    Hypertension    Lung nodules    Neuromuscular disorder ([place])    Neuropathy feet   Osteopenia    Peripheral vascular disease ([place])    Pneumonia June 2013   Positional vertigo    intermittent    Shortness of breath    chronic per patient   Stroke [facility])    x2 in 2006   Stroke [facility])    Thyroid cancer ([place])    Thyroid disease    Type 2 diabetes mellitus ([place])    Vitamin D deficiency      Family History  Problem Relation Age of Onset   Stomach cancer Mother    Heart failure Mother    Breast cancer Mother  Pneumonia Father    Diabetes Brother    Heart disease Brother        Heart Disease before age 14,  AAA and  Carotid   Heart attack Brother    Diabetes Paternal Uncle      Social History   Socioeconomic History   Marital status: Married    Spouse name: Not on file   Number of children: Not on file   Years of education: Not on file   Highest education level: Not on file  Occupational History   Occupation: Retired    Fish farm manager: RETIRED  Tobacco Use   Smoking status: Former    Packs/day: 1.50    Years: 50.00    Pack years: 75.00    Types: Cigarettes    Quit date: 05/28/2005    Years since quitting: 16.6   Smokeless tobacco: Never  Vaping Use   Vaping Use: Never used  Substance and Sexual  Activity   Alcohol use: No    Alcohol/week: 0.0 standard drinks   Drug use: No   Sexual activity: Not Currently  Other Topics Concern   Not on file  Social History Narrative   Not on file   Social Determinants of Health   Financial Resource Strain: Not on file  Food Insecurity: Not on file  Transportation Needs: Not on file  Physical Activity: Not on file  Stress: Not on file  Social Connections: Not on file  Intimate Partner Violence: Not on file     Allergies  Allergen Reactions   Azithromycin Rash    zpack   Doxycycline Rash     Outpatient Medications Prior to Visit  Medication Sig Dispense Refill   acetaminophen (TYLENOL) 500 MG tablet Take 500-1,000 mg by mouth See admin instructions. Take 500 mg daily at bedtime and may take an additional 500mg  tablet if needed for pain     allopurinol (ZYLOPRIM) 100 MG tablet Take 150 mg by mouth daily.     clopidogrel (PLAVIX) 75 MG tablet Take 75 mg by mouth at bedtime.      COMBIVENT RESPIMAT 20-100 MCG/ACT AERS respimat Inhale 1 puff into the lungs in the morning and at bedtime.     diltiazem (CARDIZEM CD) 240 MG 24 hr capsule Take 1 capsule (240 mg total) by mouth daily. 90 capsule 3   fexofenadine (ALLEGRA) 180 MG tablet Take 180 mg by mouth at bedtime.     Fluticasone-Salmeterol (ADVAIR) 250-50 MCG/DOSE AEPB Inhale 1 puff into the lungs every 12 (twelve) hours.     HUMALOG KWIKPEN 100 UNIT/ML KwikPen Inject 4 Units into the skin See admin instructions. 4 units in the morning and 4 units late afternoon/supper time     ipratropium-albuterol (DUONEB) 0.5-2.5 (3) MG/3ML SOLN Take 3 mLs by nebulization 2 (two) times daily as needed for wheezing or shortness of breath.     LEVEMIR FLEXTOUCH 100 UNIT/ML Pen Inject 7 Units into the skin at bedtime.     levothyroxine (SYNTHROID) 100 MCG tablet 1 tablet in the morning Mon - Sat; take 1/2 tab on Sun. Take pill on an empty stomach     LORazepam (ATIVAN) 0.5 MG tablet Take 0.25-0.5 mg by  mouth daily as needed for anxiety.     mirtazapine (REMERON SOL-TAB) 15 MG disintegrating tablet Take 15 mg by mouth at bedtime.     nitroGLYCERIN (NITROSTAT) 0.4 MG SL tablet Place 1 tablet (0.4 mg total) under the tongue every 5 (five) minutes as needed for chest pain. 25 tablet  3   rosuvastatin (CRESTOR) 10 MG tablet Take 10 mg by mouth at bedtime.     valsartan-hydrochlorothiazide (DIOVAN-HCT) 80-12.5 MG tablet Take 1 tablet by mouth daily.     Vitamin D, Ergocalciferol, (DRISDOL) 50000 UNITS CAPS Take 50,000 Units by mouth every Monday.      Facility-Administered Medications Prior to Visit  Medication Dose Route Frequency Provider Last Rate Last Admin   0.9 %  sodium chloride infusion  500 mL Intravenous Continuous Irene Shipper, MD        Review of Systems  Constitutional:  Negative for chills, fever, malaise/fatigue and weight loss.  HENT:  Negative for congestion, sinus pain and sore throat.   Eyes: Negative.   Respiratory:  Positive for cough, sputum production and shortness of breath. Negative for hemoptysis.   Cardiovascular:  Positive for palpitations. Negative for chest pain, orthopnea, claudication and leg swelling.  Gastrointestinal:  Negative for abdominal pain, heartburn, nausea and vomiting.  Genitourinary: Negative.   Musculoskeletal:  Negative for joint pain and myalgias.  Skin:  Negative for rash.  Neurological:  Negative for weakness.  Endo/Heme/Allergies: Negative.   Psychiatric/Behavioral: Negative.     Objective:   Vitals:   12/29/21 1140  BP: 124/70  Pulse: 60  SpO2: 99%  Weight: 178 lb 3.2 oz (80.8 kg)  Height: 6' (1.829 m)    Physical Exam Constitutional:      General: He is not in acute distress. HENT:     Head: Normocephalic and atraumatic.  Eyes:     Extraocular Movements: Extraocular movements intact.     Conjunctiva/sclera: Conjunctivae normal.     Pupils: Pupils are equal, round, and reactive to light.  Cardiovascular:     Rate and  Rhythm: Normal rate and regular rhythm.     Pulses: Normal pulses.     Heart sounds: Normal heart sounds. No murmur heard. Pulmonary:     Effort: Pulmonary effort is normal.     Breath sounds: No wheezing, rhonchi or rales.  Abdominal:     General: Bowel sounds are normal.     Palpations: Abdomen is soft.  Musculoskeletal:     Right lower leg: No edema.     Left lower leg: No edema.  Lymphadenopathy:     Cervical: No cervical adenopathy.  Skin:    General: Skin is warm and dry.  Neurological:     General: No focal deficit present.     Mental Status: He is alert.  Psychiatric:        Mood and Affect: Mood normal.        Behavior: Behavior normal.        Thought Content: Thought content normal.        Judgment: Judgment normal.    CBC    Component Value Date/Time   WBC 9.2 11/14/2021 0232   RBC 3.30 (L) 11/14/2021 0232   HGB 10.9 (L) 11/14/2021 0232   HGB 14.1 01/17/2011 0844   HCT 31.6 (L) 11/14/2021 0232   HCT 40.6 01/17/2011 0844   PLT 192 11/14/2021 0232   PLT 152 01/17/2011 0844   MCV 95.8 11/14/2021 0232   MCV 95.3 01/17/2011 0844   MCH 33.0 11/14/2021 0232   MCHC 34.5 11/14/2021 0232   RDW 13.4 11/14/2021 0232   RDW 13.6 01/17/2011 0844   LYMPHSABS 0.4 (L) 11/14/2021 0232   LYMPHSABS 0.9 01/17/2011 0844   MONOABS 0.5 11/14/2021 0232   MONOABS 0.7 01/17/2011 0844   EOSABS 0.0 11/14/2021 0232   EOSABS  0.1 01/17/2011 0844   BASOSABS 0.0 11/14/2021 0232   BASOSABS 0.0 01/17/2011 0844   BMP Latest Ref Rng & Units 11/14/2021 11/13/2021 11/12/2021  Glucose 70 - 99 mg/dL 191(H) 230(H) 221(H)  BUN 8 - 23 mg/dL 58(H) 59(H) 64(H)  Creatinine 0.61 - 1.24 mg/dL 1.74(H) 1.68(H) 1.83(H)  Sodium 135 - 145 mmol/L 139 137 137  Potassium 3.5 - 5.1 mmol/L 4.2 4.2 4.4  Chloride 98 - 111 mmol/L 110 106 106  CO2 22 - 32 mmol/L 21(L) 22 23  Calcium 8.9 - 10.3 mg/dL 8.5(L) 8.5(L) 8.8(L)   Chest imaging: CXR 11/12/21 Large lung volumes. Emphysema on CT. Vague, peripheral  increased bilateral pulmonary opacity is stable from the the portable chest 2 days ago. No pneumothorax or pleural effusion. Stable cardiac size and mediastinal contours. Visualized tracheal air column is within normal limits. No acute osseous abnormality identified. Small surgical clips at the thoracic inlet from thyroidectomy.  CT Chest 11/10/21 Slight progression of the pulmonary emphysema pattern with bilateral lower lobe peripheral patchy ground-glass mild opacities suggesting mild pneumonitis/viral pneumonia.   Stable calcified and noncalcified pulmonary nodules as detailed above.   Aortic and native coronary atherosclerosis.   Aortic Atherosclerosis (ICD10-I70.0) and Emphysema (ICD10-J43.9).  PFT: No flowsheet data found.  Labs:  Path:  Echo 11/10/21: LV EF 60-65%. RV systolic function and size are normal. LA size is severely dilated. RA moderately dilated.   Heart Catheterization:  Assessment & Plan:   Centrilobular emphysema ([place]) - Plan: 6 minute walk, Pulmonary Function Test, Pulse oximetry, overnight, CANCELED: Pulse oximetry, overnight  Acute respiratory failure with hypoxia (HCC) - Plan: 6 minute walk  Gastroesophageal reflux disease without esophagitis - Plan: famotidine (PEPCID) 20 MG tablet  Discussion: David [name] is an 88 year old male, former smoker with DMII, hypertension, CVA, and COPD who returns to pulmonary clinic for pneumonia and emphysema.   Review of patient's chest imaging from 2019 until most recently during his hospitalization 11/10/2021 he has diffuse emphysematous changes with some subpleural reticulation and fibrosis that appears to have progressed with his recent COVID-19 infection.  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  The progression of these fibrotic areas is likely responsible for his new oxygen requirements and respiratory failure.  On simple walk in clinic today he does not desaturate  below 88%. We will check a 6 minute walk test to determine if he has any drop in oxygen saturations with prolonged activity. We will also check an overnight oximetry test on room air.   Patient is to continue Advair 250-50 MCG 1 puff twice daily along with as needed Combivent and DuoNeb nebulizer treatments.  He is to use the flutter valve after each nebulizer treatment for airway clearance.  He is to start famotidine 20mg  at bedtime for reduction in reflux symptoms. Also instructed patient to elevate the head of his bed via blocks or purchase a wedge pillow to reduce nocturnal reflux symptoms. We will monitor for improvement of his chest congestion and cough.   He is to follow up in 6 months with pulmonary function tests.  Freda Jackson, MD [facility] Office: [phone]   Current Outpatient Medications:    acetaminophen (TYLENOL) 500 MG tablet, Take 500-1,000 mg by mouth See admin instructions. Take 500 mg daily at bedtime and may take an additional 500mg  tablet if needed for pain, Disp: , Rfl:    allopurinol (ZYLOPRIM) 100 MG tablet, Take 150 mg by mouth daily., Disp: ,  Rfl:    clopidogrel (PLAVIX) 75 MG tablet, Take 75 mg by mouth at bedtime. , Disp: , Rfl:    COMBIVENT RESPIMAT 20-100 MCG/ACT AERS respimat, Inhale 1 puff into the lungs in the morning and at bedtime., Disp: , Rfl:    diltiazem (CARDIZEM CD) 240 MG 24 hr capsule, Take 1 capsule (240 mg total) by mouth daily., Disp: 90 capsule, Rfl: 3   famotidine (PEPCID) 20 MG tablet, Take 1 tablet (20 mg total) by mouth at bedtime., Disp: 30 tablet, Rfl: 0   fexofenadine (ALLEGRA) 180 MG tablet, Take 180 mg by mouth at bedtime., Disp: , Rfl:    Fluticasone-Salmeterol (ADVAIR) 250-50 MCG/DOSE AEPB, Inhale 1 puff into the lungs every 12 (twelve) hours., Disp: , Rfl:    HUMALOG KWIKPEN 100 UNIT/ML KwikPen, Inject 4 Units into the skin See admin instructions. 4 units in the morning and 4 units late afternoon/supper  time, Disp: , Rfl:    ipratropium-albuterol (DUONEB) 0.5-2.5 (3) MG/3ML SOLN, Take 3 mLs by nebulization 2 (two) times daily as needed for wheezing or shortness of breath., Disp: , Rfl:    LEVEMIR FLEXTOUCH 100 UNIT/ML Pen, Inject 7 Units into the skin at bedtime., Disp: , Rfl:    levothyroxine (SYNTHROID) 100 MCG tablet, 1 tablet in the morning Mon - Sat; take 1/2 tab on Sun. Take pill on an empty stomach, Disp: , Rfl:    LORazepam (ATIVAN) 0.5 MG tablet, Take 0.25-0.5 mg by mouth daily as needed for anxiety., Disp: , Rfl:    mirtazapine (REMERON SOL-TAB) 15 MG disintegrating tablet, Take 15 mg by mouth at bedtime., Disp: , Rfl:    nitroGLYCERIN (NITROSTAT) 0.4 MG SL tablet, Place 1 tablet (0.4 mg total) under the tongue every 5 (five) minutes as needed for chest pain., Disp: 25 tablet, Rfl: 3   rosuvastatin (CRESTOR) 10 MG tablet, Take 10 mg by mouth at bedtime., Disp: , Rfl:    valsartan-hydrochlorothiazide (DIOVAN-HCT) 80-12.5 MG tablet, Take 1 tablet by mouth daily., Disp: , Rfl:    Vitamin D, Ergocalciferol, (DRISDOL) 50000 UNITS CAPS, Take 50,000 Units by mouth every Monday. , Disp: , Rfl:   Current Facility-Administered Medications:    0.9 %  sodium chloride infusion, 500 mL, Intravenous, Continuous, Irene Shipper, MD

## 2021-12-29 NOTE — Patient Instructions (Signed)
We will schedule you for a 6 minute walk test to evaluate if you need oxygen with extended walking.   If your oxygen levels remain above 88% during this test, then you do not need oxygen therapy.   We will check an overnight oximitry test on room air to see if you need to continue oxygen when sleeping. This will be done through your oxygen company.  Continue taking advair inhaler and current nebulizer treatments.   Follow up in 6 months with pulmonary function tests.

## 2022-01-01 ENCOUNTER — Other Ambulatory Visit: Payer: Self-pay

## 2022-01-01 ENCOUNTER — Ambulatory Visit (INDEPENDENT_AMBULATORY_CARE_PROVIDER_SITE_OTHER): Payer: Medicare Other

## 2022-01-01 DIAGNOSIS — J432 Centrilobular emphysema: Secondary | ICD-10-CM

## 2022-01-01 DIAGNOSIS — J449 Chronic obstructive pulmonary disease, unspecified: Secondary | ICD-10-CM

## 2022-01-01 NOTE — Progress Notes (Signed)
Six Minute Walk - 01/01/22 1022       Six Minute Walk   Medications taken before test (dose and time) Allopurinol diltiazem HCI Ergocalciferol Fexofenadine HCI Insulin Lispro (4 units) Levothyroxine Valsartan-hydrochlorothiazide    Supplemental oxygen during test? No    Lap distance in meters  34 meters    Laps Completed 8    Partial lap (in meters) 13 meters    Baseline BP (sitting) 148/78    Baseline Heartrate 63    Baseline Dyspnea (Borg Scale) 2    Baseline Fatigue (Borg Scale) 3    Baseline SPO2 96 %      End of Test Values    BP (sitting) 160/78    Heartrate 82    Dyspnea (Borg Scale) 5    Fatigue (Borg Scale) 4    SPO2 90 %      2 Minutes Post Walk Values   BP (sitting) 142/74    Heartrate 69    SPO2 96 %    Stopped or paused before six minutes? No      Interpretation   Distance completed 285 meters    Tech Comments: Pt completed 8 full laps and 13 metes on final lap at mod pace without any stops. Pt did c/o increased SOB and fatuige at end of walk

## 2022-01-03 ENCOUNTER — Encounter: Payer: Self-pay | Admitting: Pulmonary Disease

## 2022-01-04 ENCOUNTER — Ambulatory Visit: Payer: Medicare Other | Admitting: Internal Medicine

## 2022-01-08 ENCOUNTER — Telehealth: Payer: Self-pay | Admitting: Pulmonary Disease

## 2022-01-08 NOTE — Telephone Encounter (Signed)
Received ONO results from [employer]. Per the report, patient spent around 47 mins with his O2 below 88% on room air. Will place results in JD's review folder for next week.

## 2022-01-09 NOTE — Telephone Encounter (Signed)
Patient should continue to use 2L of supplemental oxygen at night when sleeping. Based on his 6MWT he does not require oxygen with ambulation.   Thanks, JD

## 2022-01-15 NOTE — Telephone Encounter (Signed)
ATC patient but I received a busy tone each time I called. Will attempt to call back later.

## 2022-01-18 NOTE — Progress Notes (Signed)
Cardiology Office Note:    Date:  01/19/2022   ID:  David [name], DOB 11/26/1931, MRN [id number]  PCP:  David [name], [place]  Cardiologist:  David Lean, MD  Advanced Practice Provider:  No care team member to display Electrophysiologist:  None      Referring MD: David Infante, MD   CC: follow up  PVCs  History of Present Illness:    David [name] is a 88 y.o. 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  In interim of this visit, patient had mild AS and normal LV function.  Had frequent PVCs and started diltiazem now on 240 mg.  Has repeat stress test with no changes.  Seen 01/19/22.  Patient notes that he is doing well.  He is getting back to doing car work and doing some exercises   Doing physical therapy and doing squats and walking more. There are no interval hospital/ED visit.    Has not needed nitro.  About off the O2 all the time.  Rare chest pain.  Breathing has improved with pulmonology therapies.   No palpitations or syncope.    Past Medical History:  Diagnosis Date   AAA (abdominal aortic aneurysm)    AAA (abdominal aortic aneurysm)    AAA (abdominal aortic aneurysm)    Atherosclerosis    pt denies this   BPH (benign prostatic hyperplasia)    CKD (chronic kidney disease), stage III (HCC)    family reports caused by scans but that has resolved since CT scans have stopped   Colon cancer ([place])    COPD (chronic obstructive pulmonary disease) ([place])    Diabetes mellitus    ED (erectile dysfunction)    Gastroesophageal reflux disease    Heart murmur    Hyperlipidemia    Hypertension    Lung nodules    Neuromuscular disorder ([place])    Neuropathy feet   Osteopenia    Peripheral vascular disease ([place])    Pneumonia June 2013   Positional  vertigo    intermittent    Shortness of breath    chronic per patient   Stroke [facility])    x2 in 2006   Stroke [facility])    Thyroid cancer ([place])    Thyroid disease    Type 2 diabetes mellitus ([place])    Vitamin D deficiency     Past Surgical History:  Procedure Laterality Date   ABDOMINAL AORTIC ANEURYSM REPAIR N/A 11/08/2014   Procedure: ANEURYSM ABDOMINAL AORTIC REPAIR;  Surgeon: David Posner, MD;  Location: [facility];  Service: Vascular;  Laterality: N/A;   ABDOMINAL AORTIC ANEURYSM REPAIR     COLON RESECTION  2007   COLON SURGERY  2006   Resection   CYSTOSCOPY W/ RETROGRADES Bilateral 12/30/2020   Procedure: CYSTOSCOPY WITH BILATERAL RETROGRADE;  Surgeon: David Seal, MD;  Location: [facility];  Service: Urology;  Laterality: Bilateral;   CYSTOSCOPY WITH BIOPSY N/A 12/30/2020   Procedure: CYSTOSCOPY WITH BIOPSY AND FULGURATION;  Surgeon: David Seal, MD;  Location: [facility];  Service: Urology;  Laterality: N/A;   SEPTOPLASTY     THYROIDECTOMY  2007   TRANSURETHRAL RESECTION OF BLADDER TUMOR WITH MITOMYCIN-C N/A 02/12/2017   Procedure: TRANSURETHRAL RESECTION OF BLADDER TUMOR;  Surgeon: David Seal, MD;  [facility];  Service:  Urology;  Laterality: N/A;    Current Medications: Current Meds  Medication Sig   acetaminophen (TYLENOL) 500 MG tablet Take 500-1,000 mg by mouth See admin instructions. Take 500 mg daily at bedtime and may take an additional 500mg  tablet if needed for pain   allopurinol (ZYLOPRIM) 100 MG tablet Take 150 mg by mouth daily.   clopidogrel (PLAVIX) 75 MG tablet Take 75 mg by mouth at bedtime.    COMBIVENT RESPIMAT 20-100 MCG/ACT AERS respimat Inhale 1 puff into the lungs in the morning and at bedtime.   diltiazem (CARDIZEM CD) 240 MG 24 hr capsule Take 1 capsule (240 mg total) by mouth daily.   famotidine (PEPCID) 20 MG tablet Take 1 tablet (20 mg total) by mouth at bedtime.   fexofenadine (ALLEGRA) 180 MG tablet Take 180 mg by mouth at bedtime.   Fluticasone-Salmeterol  (ADVAIR) 250-50 MCG/DOSE AEPB Inhale 1 puff into the lungs every 12 (twelve) hours.   HUMALOG KWIKPEN 100 UNIT/ML KwikPen Inject 4 Units into the skin See admin instructions. 4 units in the morning and 4 units late afternoon/supper time   ipratropium-albuterol (DUONEB) 0.5-2.5 (3) MG/3ML SOLN Take 3 mLs by nebulization 2 (two) times daily as needed for wheezing or shortness of breath.   LEVEMIR FLEXTOUCH 100 UNIT/ML Pen Inject 7 Units into the skin at bedtime.   levothyroxine (SYNTHROID) 100 MCG tablet 1 tablet in the morning Mon - Sat; take 1/2 tab on Sun. Take pill on an empty stomach   LORazepam (ATIVAN) 0.5 MG tablet Take 0.25-0.5 mg by mouth daily as needed for anxiety.   mirtazapine (REMERON SOL-TAB) 15 MG disintegrating tablet Take 15 mg by mouth at bedtime.   nitroGLYCERIN (NITROSTAT) 0.4 MG SL tablet Place 1 tablet (0.4 mg total) under the tongue every 5 (five) minutes as needed for chest pain.   rosuvastatin (CRESTOR) 10 MG tablet Take 10 mg by mouth at bedtime.   valsartan-hydrochlorothiazide (DIOVAN-HCT) 80-12.5 MG tablet Take 1 tablet by mouth daily.   Vitamin D, Ergocalciferol, (DRISDOL) 50000 UNITS CAPS Take 50,000 Units by mouth every Monday.    Current Facility-Administered Medications for the 01/19/22 encounter (Office Visit) with Rudean Haskell A, MD  Medication   0.9 %  sodium chloride infusion     Allergies:   Azithromycin and Doxycycline   Social History   Socioeconomic History   Marital status: Married    Spouse name: Not on file   Number of children: Not on file   Years of education: Not on file   Highest education level: Not on file  Occupational History   Occupation: Retired    Fish farm manager: RETIRED  Tobacco Use   Smoking status: Former    Packs/day: 1.50    Years: 50.00    Pack years: 75.00    Types: Cigarettes    Quit date: 05/28/2005    Years since quitting: 16.6   Smokeless tobacco: Never  Vaping Use   Vaping Use: Never used  Substance and Sexual  Activity   Alcohol use: No    Alcohol/week: 0.0 standard drinks   Drug use: No   Sexual activity: Not Currently  Other Topics Concern   Not on file  Social History Narrative   Not on file   Social Determinants of Health   Financial Resource Strain: Not on file  Food Insecurity: Not on file  Transportation Needs: Not on file  Physical Activity: Not on file  Stress: Not on file  Social Connections: Not on file  Family History: The patient's family history includes Breast cancer in his mother; Diabetes in his brother and paternal uncle; Heart attack in his brother; Heart disease in his brother; Heart failure in his mother; Pneumonia in his father; Stomach cancer in his mother.  ROS:   Please see the history of present illness.    All other systems reviewed and are negative.  EKGs/Labs/Other Studies Reviewed:    The following studies were reviewed today:  EKG:  EKG is  ordered today.  The ekg ordered today demonstrates  01/19/22:  Sinus bradycardia 1st HB occasional PVCs 12/07/21: SR rate 81 1st HB PACs with no PVCs 04/24/21: SR rate 71 1st HB no PVCs 04/17/21: SR rate 63 with PACs, and PVCs  Cardiac Event Monitoring: Date: 05/19/21 Results: Patient had a minimum heart rate of 21 bpm, maximum heart rate of 203 bpm, and average heart rate of 64 bpm. Predominant underlying rhythm was Sinus Rhythm. Two runs of non-sustained ventricular tachycardia occurred lasting 7 beats at longest with a max rate of 203 bpm at fastest. Two runs of supraventricular tachycardia occurred lasting 8 beats at longest with a max rate of 143 bpm at fastest. Isolated PACs were rare (<1.0%), with rare couplets and triplets present. Isolated PVCs were frequent (7.7% [id number]), with rare couplets and triplets present. First Degree AV Block was present, in addition there were 2 nocturnal pauses that occurred (2:1 HB and high grade AV block) with the longest lasting 3.8 secs. Triggered and diary events  associated with sinus rhythm and PVCs.   Frequent PVCs with rare nocturnal heart block  Transthoracic Echocardiogram: Date: 11/10/21 Results:  1. Left ventricular ejection fraction, by estimation, is 60 to 65%. The  left ventricle has normal function. The left ventricle has no regional  wall motion abnormalities. Left ventricular diastolic function could not  be evaluated.   2. Right ventricular systolic function is normal. The right ventricular  size is normal.   3. Left atrial size was severely dilated.   4. Right atrial size was moderately dilated.   5. The mitral valve is normal in structure. No evidence of mitral valve  regurgitation. No evidence of mitral stenosis.   6. The aortic valve is normal in structure. There is mild calcification  of the aortic valve. There is mild thickening of the aortic valve. Aortic  valve regurgitation is not visualized. Mild aortic valve stenosis. Aortic  valve area, by VTI measures 1.86  cm. Aortic valve mean gradient measures 11.0 mmHg. Aortic valve Vmax  measures 2.25 m/s.   7. The inferior vena cava is dilated in size with >50% respiratory  variability, suggesting right atrial pressure of 8 mmHg.    Non Cardiac CT: Date: 08/17/20 Results: Significant aortic atherosclerosis CAC in LAD and LCx   NM Stress Testing: Date: 10/08/2014 Results: Low risk myoveiw   Recent Labs: 11/12/2021: TSH 0.058 11/14/2021: ALT 53; B Natriuretic Peptide 157.2; BUN 58; Creatinine, Ser 1.74; Hemoglobin 10.9; Magnesium 2.2; Platelets 192; Potassium 4.2; Sodium 139  Recent Lipid Panel    Component Value Date/Time   CHOL 92 06/14/2012 0725   TRIG 66 06/14/2012 0725   HDL 34 (L) 06/14/2012 0725   CHOLHDL 2.7 06/14/2012 0725   VLDL 13 06/14/2012 0725   LDLCALC 45 06/14/2012 0725    Physical Exam:    VS:  BP 118/60    Pulse (!) 56    Ht 6' (1.829 m)    Wt 79.8 kg    SpO2 99%  BMI 23.87 kg/m     Wt Readings from Last 3 Encounters:  01/19/22  79.8 kg  12/29/21 80.8 kg  12/26/21 79.8 kg     Gen: no distress, Elderly male   Neck: No JVD,  Cardiac: No Rubs or Gallops, systolic Murmur, irregular bradycardia, +2 radial pulses Respiratory: Clear to auscultation bilaterally, decreased lung volumes bilaterally normal  respiratory rate GI: Soft, nontender, non-distended  MS: No  edema;  moves all extremities Integument: Skin feels warm Neuro:  At time of evaluation, alert and oriented to person/place/time/situation  Psych: Normal affect, patient feels better    ASSESSMENT:    1. PVC (premature ventricular contraction)   2. PAC (premature atrial contraction)   3. COPD with acute exacerbation ([place])   4. Thoracic aortic aneurysm without rupture, unspecified part   5. Aortic stenosis, mild      PLAN:    HTN with DM Aortic atherosclerosis HLD with DM PAD and AAA s/p repair- sees VVS Mild TAA- 4.1 cm at stable Mild AS CKD Stage IIIb Prior Stroke - at last visit started PRN nitro, none needed - continue plavix, on rosuvastatin 10 mg LDL < 55 - continue Diovan-HCT 80/12.5 - has been released from TCT given age and stability of the AA; reasonable and will not make further changes - Echo in one-two years for AS (2024)  PVCs and PACs- asymptomatic COPD Thyroid dysregulation - will repeat monitor 7 days - diltiazem 240 mg PO daily appears to be the balance between PVCs and bradycardia - would have high bar to start Type III AAD (do not feel 1c agents would be safe)  6 months me or APP    Medication Adjustments/Labs and Tests Ordered: Current medicines are reviewed at length with the patient today.  Concerns regarding medicines are outlined above.  Orders Placed This Encounter  Procedures   LONG TERM MONITOR (3-14 DAYS)   EKG 12-Lead    No orders of the defined types were placed in this encounter.    Patient Instructions  Medication Instructions:  Your physician recommends that you continue on your current  medications as directed. Please refer to the Current Medication list given to you today.  *If you need a refill on your cardiac medications before your next appointment, please call your pharmacy*   Lab Work: NONE If you have labs (blood work) drawn today and your tests are completely normal, you will receive your results only by: [place] (if you have MyChart) OR A paper copy in the mail If you have any lab test that is abnormal or we need to change your treatment, we will call you to review the results.   Testing/Procedures: Your physician has requested that you wear a 7 day heart monitor.    Follow-Up: At [facility], you and your health needs are our priority.  As part of our continuing mission to provide you with exceptional heart care, we have created designated Provider Care Teams.  These Care Teams include your primary Cardiologist (physician) and Advanced Practice Providers (APPs -  Physician Assistants and Nurse Practitioners) who all work together to provide you with the care you need, when you need it.  We recommend signing up for the patient portal called "MyChart".  Sign up information is provided on this After Visit Summary.  MyChart is used to connect with patients for Virtual Visits (Telemedicine).  Patients are able to view lab/test results, encounter notes, upcoming appointments, etc.  Non-urgent messages can be sent to your provider  as well.   To learn more about what you can do with MyChart, go to [email or web address].    Your next appointment:   6 month(s)  The format for your next appointment:   In Person  Provider:   Werner Lean, MD      Other Instructions  Bryn Gulling- Long Term Monitor Instructions  Your physician has requested you wear a ZIO patch monitor for 14 days.  This is a single patch monitor. [employer] supplies one patch monitor per enrollment. Additional stickers are not available. Please do not apply patch if you will be  having a Nuclear Stress Test,  Echocardiogram, Cardiac CT, MRI, or Chest Xray during the period you would be wearing the  monitor. The patch cannot be worn during these tests. You cannot remove and re-apply the  ZIO XT patch monitor.  Your ZIO patch monitor will be mailed 3 day USPS to your address on file. It may take 3-5 days  to receive your monitor after you have been enrolled.  Once you have received your monitor, please review the enclosed instructions. Your monitor  has already been registered assigning a specific monitor serial # to you.  Billing and Patient Assistance Program Information  We have supplied [employer] with any of your insurance information on file for billing purposes. [employer] offers a sliding scale Patient Assistance Program for patients that do not have  insurance, or whose insurance does not completely cover the cost of the ZIO monitor.  You must apply for the Patient Assistance Program to qualify for this discounted rate.  To apply, please call [employer] at [phone], select option 4, select option 2, ask to apply for  Patient Assistance Program. Theodore Demark will ask your household income, and how many people  are in your household. They will quote your out-of-pocket cost based on that information.  [employer] will also be able to set up a 24-month, interest-free payment plan if needed.  Applying the monitor   Shave hair from upper left chest.  Hold abrader disc by orange tab. Rub abrader in 40 strokes over the upper left chest as  indicated in your monitor instructions.  Clean area with 4 enclosed alcohol pads. Let dry.  Apply patch as indicated in monitor instructions. Patch will be placed under collarbone on left  side of chest with arrow pointing upward.  Rub patch adhesive wings for 2 minutes. Remove white label marked "1". Remove the white  label marked "2". Rub patch adhesive wings for 2 additional minutes.  While looking in a mirror, press and release button  in center of patch. A small green light will  flash 3-4 times. This will be your only indicator that the monitor has been turned on.  Do not shower for the first 24 hours. You may shower after the first 24 hours.  Press the button if you feel a symptom. You will hear a small click. Record Date, Time and  Symptom in the Patient Logbook.  When you are ready to remove the patch, follow instructions on the last 2 pages of Patient  Logbook. Stick patch monitor onto the last page of Patient Logbook.  Place Patient Logbook in the blue and white box. Use locking tab on box and tape box closed  securely. The blue and white box has prepaid postage on it. Please place it in the mailbox as  soon as possible. Your physician should have your test results approximately 7 days after the  monitor has been mailed back  to [employer].  Call [facility] at [phone] if you have questions regarding  your ZIO XT patch monitor. Call them immediately if you see an orange light blinking on your  monitor.  If your monitor falls off in less than 4 days, contact our Monitor department at [phone].  If your monitor becomes loose or falls off after 4 days call [employer] at [phone] for  suggestions on securing your monitor     Signed, David Lean, MD  01/19/2022 11:33 AM    [place]

## 2022-01-19 ENCOUNTER — Ambulatory Visit (INDEPENDENT_AMBULATORY_CARE_PROVIDER_SITE_OTHER): Payer: Medicare Other

## 2022-01-19 ENCOUNTER — Other Ambulatory Visit: Payer: Self-pay

## 2022-01-19 ENCOUNTER — Ambulatory Visit: Payer: Medicare Other | Admitting: Internal Medicine

## 2022-01-19 ENCOUNTER — Encounter: Payer: Self-pay | Admitting: Internal Medicine

## 2022-01-19 VITALS — BP 118/60 | HR 56 | Ht 72.0 in | Wt 176.0 lb

## 2022-01-19 DIAGNOSIS — I491 Atrial premature depolarization: Secondary | ICD-10-CM

## 2022-01-19 DIAGNOSIS — I493 Ventricular premature depolarization: Secondary | ICD-10-CM | POA: Diagnosis not present

## 2022-01-19 DIAGNOSIS — J441 Chronic obstructive pulmonary disease with (acute) exacerbation: Secondary | ICD-10-CM

## 2022-01-19 DIAGNOSIS — I712 Thoracic aortic aneurysm, without rupture, unspecified: Secondary | ICD-10-CM | POA: Diagnosis not present

## 2022-01-19 DIAGNOSIS — I350 Nonrheumatic aortic (valve) stenosis: Secondary | ICD-10-CM

## 2022-01-19 NOTE — Patient Instructions (Signed)
Medication Instructions:  Your physician recommends that you continue on your current medications as directed. Please refer to the Current Medication list given to you today.  *If you need a refill on your cardiac medications before your next appointment, please call your pharmacy*   Lab Work: NONE If you have labs (blood work) drawn today and your tests are completely normal, you will receive your results only by: [place] (if you have MyChart) OR A paper copy in the mail If you have any lab test that is abnormal or we need to change your treatment, we will call you to review the results.   Testing/Procedures: Your physician has requested that you wear a 7 day heart monitor.    Follow-Up: At [facility], you and your health needs are our priority.  As part of our continuing mission to provide you with exceptional heart care, we have created designated Provider Care Teams.  These Care Teams include your primary Cardiologist (physician) and Advanced Practice Providers (APPs -  Physician Assistants and Nurse Practitioners) who all work together to provide you with the care you need, when you need it.  We recommend signing up for the patient portal called "MyChart".  Sign up information is provided on this After Visit Summary.  MyChart is used to connect with patients for Virtual Visits (Telemedicine).  Patients are able to view lab/test results, encounter notes, upcoming appointments, etc.  Non-urgent messages can be sent to your provider as well.   To learn more about what you can do with MyChart, go to [email or web address].    Your next appointment:   6 month(s)  The format for your next appointment:   In Person  Provider:   Werner Lean, MD      Other Instructions  Bryn Gulling- Long Term Monitor Instructions  Your physician has requested you wear a ZIO patch monitor for 14 days.  This is a single patch monitor. [employer] supplies one patch monitor per  enrollment. Additional stickers are not available. Please do not apply patch if you will be having a Nuclear Stress Test,  Echocardiogram, Cardiac CT, MRI, or Chest Xray during the period you would be wearing the  monitor. The patch cannot be worn during these tests. You cannot remove and re-apply the  ZIO XT patch monitor.  Your ZIO patch monitor will be mailed 3 day USPS to your address on file. It may take 3-5 days  to receive your monitor after you have been enrolled.  Once you have received your monitor, please review the enclosed instructions. Your monitor  has already been registered assigning a specific monitor serial # to you.  Billing and Patient Assistance Program Information  We have supplied [employer] with any of your insurance information on file for billing purposes. [employer] offers a sliding scale Patient Assistance Program for patients that do not have  insurance, or whose insurance does not completely cover the cost of the ZIO monitor.  You must apply for the Patient Assistance Program to qualify for this discounted rate.  To apply, please call [employer] at [phone], select option 4, select option 2, ask to apply for  Patient Assistance Program. Theodore Demark will ask your household income, and how many people  are in your household. They will quote your out-of-pocket cost based on that information.  [employer] will also be able to set up a 36-month, interest-free payment plan if needed.  Applying the monitor   Shave hair from upper left chest.  Hold abrader disc  by orange tab. Rub abrader in 40 strokes over the upper left chest as  indicated in your monitor instructions.  Clean area with 4 enclosed alcohol pads. Let dry.  Apply patch as indicated in monitor instructions. Patch will be placed under collarbone on left  side of chest with arrow pointing upward.  Rub patch adhesive wings for 2 minutes. Remove white label marked "1". Remove the white  label marked "2". Rub patch  adhesive wings for 2 additional minutes.  While looking in a mirror, press and release button in center of patch. A small green light will  flash 3-4 times. This will be your only indicator that the monitor has been turned on.  Do not shower for the first 24 hours. You may shower after the first 24 hours.  Press the button if you feel a symptom. You will hear a small click. Record Date, Time and  Symptom in the Patient Logbook.  When you are ready to remove the patch, follow instructions on the last 2 pages of Patient  Logbook. Stick patch monitor onto the last page of Patient Logbook.  Place Patient Logbook in the blue and white box. Use locking tab on box and tape box closed  securely. The blue and white box has prepaid postage on it. Please place it in the mailbox as  soon as possible. Your physician should have your test results approximately 7 days after the  monitor has been mailed back to [facility].  Call [place] at [phone] if you have questions regarding  your ZIO XT patch monitor. Call them immediately if you see an orange light blinking on your  monitor.  If your monitor falls off in less than 4 days, contact our Monitor department at [phone].  If your monitor becomes loose or falls off after 4 days call [employer] at [phone] for  suggestions on securing your monitor

## 2022-01-19 NOTE — Progress Notes (Unsigned)
Enrolled patient for a 14 day Zio XT  monitor to be mailed to patients home  °

## 2022-01-22 DIAGNOSIS — I493 Ventricular premature depolarization: Secondary | ICD-10-CM

## 2022-01-31 ENCOUNTER — Other Ambulatory Visit: Payer: Self-pay

## 2022-01-31 ENCOUNTER — Ambulatory Visit (INDEPENDENT_AMBULATORY_CARE_PROVIDER_SITE_OTHER): Payer: Medicare Other | Admitting: Pulmonary Disease

## 2022-01-31 DIAGNOSIS — J432 Centrilobular emphysema: Secondary | ICD-10-CM | POA: Diagnosis not present

## 2022-01-31 LAB — PULMONARY FUNCTION TEST
DL/VA % pred: 53 %
DL/VA: 2 ml/min/mmHg/L
DLCO cor % pred: 45 %
DLCO cor: 11.3 ml/min/mmHg
DLCO unc % pred: 45 %
DLCO unc: 11.3 ml/min/mmHg
FEF 25-75 Post: 1.79 L/sec
FEF 25-75 Pre: 1.69 L/sec
FEF2575-%Change-Post: 5 %
FEF2575-%Pred-Post: 101 %
FEF2575-%Pred-Pre: 95 %
FEV1-%Change-Post: 4 %
FEV1-%Pred-Post: 92 %
FEV1-%Pred-Pre: 88 %
FEV1-Post: 2.57 L
FEV1-Pre: 2.47 L
FEV1FVC-%Change-Post: 3 %
FEV1FVC-%Pred-Pre: 101 %
FEV6-%Change-Post: 0 %
FEV6-%Pred-Post: 94 %
FEV6-%Pred-Pre: 93 %
FEV6-Post: 3.5 L
FEV6-Pre: 3.49 L
FEV6FVC-%Pred-Post: 107 %
FEV6FVC-%Pred-Pre: 107 %
FVC-%Change-Post: 0 %
FVC-%Pred-Post: 87 %
FVC-%Pred-Pre: 87 %
FVC-Post: 3.5 L
FVC-Pre: 3.49 L
Post FEV1/FVC ratio: 73 %
Post FEV6/FVC ratio: 100 %
Pre FEV1/FVC ratio: 71 %
Pre FEV6/FVC Ratio: 100 %
RV % pred: 64 %
RV: 1.89 L
TLC % pred: 70 %
TLC: 5.26 L

## 2022-01-31 NOTE — Patient Instructions (Signed)
Full PFT performed today. °

## 2022-01-31 NOTE — Progress Notes (Signed)
Full PFT performed today. °

## 2022-02-06 ENCOUNTER — Telehealth: Payer: Self-pay | Admitting: Internal Medicine

## 2022-02-06 NOTE — Telephone Encounter (Signed)
[employer] is calling to report to pts final summary to his zio monitor, to Dr. Gasper Sells.  Per [place] with [employer] the pts monitor finding are back and loaded into the portal, for Dr. Gasper Sells to review. Per [place] with [employer], pts monitor results showed CHB and several pauses, with longest pause 6.6 secs long.   Will route this message to Dr. Gasper Sells and covering RN as an Juluis Rainier, so they can view pts complete zio monitor report.

## 2022-02-06 NOTE — Telephone Encounter (Signed)
[employer] calling with critical EKG

## 2022-02-07 ENCOUNTER — Telehealth: Payer: Self-pay

## 2022-02-07 DIAGNOSIS — I493 Ventricular premature depolarization: Secondary | ICD-10-CM

## 2022-02-07 NOTE — Telephone Encounter (Signed)
The patient and spouse have been notified of the result and verbalized understanding.  All questions (if any) were answered. Precious Gilding, RN 02/07/2022 8:44 AM

## 2022-02-07 NOTE — Telephone Encounter (Signed)
-----   Message from Werner Lean, MD sent at 02/07/2022  8:07 AM EST ----- Results: Nocturnal Heart block and frequent PVCs despite diltiazem Preserved LV function Plan: EP referral  Werner Lean, MD

## 2022-02-07 NOTE — Telephone Encounter (Signed)
Pt called ordered to see EP.

## 2022-02-12 ENCOUNTER — Telehealth: Payer: Self-pay | Admitting: Pulmonary Disease

## 2022-02-12 NOTE — Telephone Encounter (Signed)
Called patient and wufe states that he is no longer on oxygen and that he has done a pulmonary test and a 6 minute walk. She is wanting to know if Dr Erin Fulling wants him to continue oxygen or not.  Dr Erin Fulling please advise

## 2022-02-13 NOTE — Telephone Encounter (Signed)
See 02/12/22 phone note

## 2022-02-13 NOTE — Telephone Encounter (Signed)
The answer to this question is documented in phone note dated 01/08/22  Per JD:  Freddi Starr, MD      1:36 AM Note Patient should continue to use 2L of supplemental oxygen at night when sleeping. Based on his 6MWT he does not require oxygen with ambulation.    Thanks, JD     I tried calling the pt's spouse and there was no answer and no option to leave a msg. Will call back later.

## 2022-02-13 NOTE — Telephone Encounter (Signed)
I spoke with the pt and notified of results per Dr. Erin Fulling. He verbalized understanding and nothing further needed.

## 2022-02-15 ENCOUNTER — Other Ambulatory Visit: Payer: Self-pay

## 2022-02-15 ENCOUNTER — Ambulatory Visit: Payer: Medicare Other | Admitting: Cardiology

## 2022-02-15 ENCOUNTER — Encounter: Payer: Self-pay | Admitting: Cardiology

## 2022-02-15 VITALS — BP 120/56 | HR 63 | Ht 72.0 in | Wt 176.4 lb

## 2022-02-15 DIAGNOSIS — I493 Ventricular premature depolarization: Secondary | ICD-10-CM | POA: Diagnosis not present

## 2022-02-15 NOTE — Progress Notes (Signed)
Electrophysiology Office Note   Date:  02/15/2022   ID:  David, [name] 06/29/1931, MRN [id number]  PCP:  David Infante, MD  Cardiologist:  David [name] Primary Electrophysiologist:  David Schlichting Meredith Leeds, MD    Chief Complaint: PVC   History of Present Illness: David [name] is a 88 y.o. male who is being seen today for the evaluation of PVC at the request of David [name], David A*. Presenting today for electrophysiology evaluation.  Has a history significant for thoracic aortic aneurysm which has remained stable, AAA status postrepair, hypertension, aortic atherosclerosis, hyperlipidemia, diabetes, COPD, CKD stage IIIa, CVA.  He presents today for work-up of PVCs and bradycardia.  He states that he had COVID in November.  His heart rate was slow at that time.  He is also had PVCs.  He is short of breath, but his shortness of breath has been at its baseline for quite some time.  He is short of breath with exertion and when it is hot outside.  He is unaware of further episodes of shortness of breath aside from that.  Today, he denies symptoms of palpitations, chest pain, shortness of breath, orthopnea, PND, lower extremity edema, claudication, dizziness, presyncope, syncope, bleeding, or neurologic sequela. The patient is tolerating medications without difficulties.    Past Medical History:  Diagnosis Date   AAA (abdominal aortic aneurysm)    AAA (abdominal aortic aneurysm)    AAA (abdominal aortic aneurysm)    Atherosclerosis    pt denies this   BPH (benign prostatic hyperplasia)    CKD (chronic kidney disease), stage III (HCC)    family reports caused by scans but that has resolved since CT scans have stopped   Colon cancer ([place])    COPD (chronic obstructive pulmonary disease) ([place])    Diabetes mellitus    ED (erectile dysfunction)    Gastroesophageal reflux disease    Heart murmur    Hyperlipidemia    Hypertension    Lung nodules    Neuromuscular disorder  ([place])    Neuropathy feet   Osteopenia    Peripheral vascular disease ([place])    Pneumonia June 2013   Positional vertigo    intermittent    Shortness of breath    chronic per patient   Stroke [facility])    x2 in 2006   Stroke [facility])    Thyroid cancer ([place])    Thyroid disease    Type 2 diabetes mellitus ([place])    Vitamin D deficiency    Past Surgical History:  Procedure Laterality Date   ABDOMINAL AORTIC ANEURYSM REPAIR N/A 11/08/2014   Procedure: ANEURYSM ABDOMINAL AORTIC REPAIR;  Surgeon: David Posner, MD;  Location: [facility];  Service: Vascular;  Laterality: N/A;   ABDOMINAL AORTIC ANEURYSM REPAIR     COLON RESECTION  2007   COLON SURGERY  2006   Resection   CYSTOSCOPY W/ RETROGRADES Bilateral 12/30/2020   Procedure: CYSTOSCOPY WITH BILATERAL RETROGRADE;  Surgeon: David Seal, MD;  Location: [facility];  Service: Urology;  Laterality: Bilateral;   CYSTOSCOPY WITH BIOPSY N/A 12/30/2020   Procedure: CYSTOSCOPY WITH BIOPSY AND FULGURATION;  Surgeon: David Seal, MD;  Location: [facility];  Service: Urology;  Laterality: N/A;   SEPTOPLASTY     THYROIDECTOMY  2007   TRANSURETHRAL RESECTION OF BLADDER TUMOR WITH MITOMYCIN-C N/A 02/12/2017   Procedure: TRANSURETHRAL RESECTION OF BLADDER TUMOR;  Surgeon: David Seal, MD;  Location: [facility];  Service: Urology;  Laterality: N/A;     Current Outpatient  Medications  Medication Sig Dispense Refill   acetaminophen (TYLENOL) 500 MG tablet Take 500-1,000 mg by mouth See admin instructions. Take 500 mg daily at bedtime and may take an additional 500mg  tablet if needed for pain     allopurinol (ZYLOPRIM) 100 MG tablet Take 150 mg by mouth daily.     clopidogrel (PLAVIX) 75 MG tablet Take 75 mg by mouth at bedtime.      COMBIVENT RESPIMAT 20-100 MCG/ACT AERS respimat Inhale 1 puff into the lungs in the morning and at bedtime.     diltiazem (CARDIZEM CD) 240 MG 24 hr capsule Take 1 capsule (240 mg total) by mouth daily. 90 capsule 3   famotidine (PEPCID) 20 MG tablet  Take 1 tablet (20 mg total) by mouth at bedtime. 30 tablet 0   fexofenadine (ALLEGRA) 180 MG tablet Take 180 mg by mouth at bedtime.     Fluticasone-Salmeterol (ADVAIR) 250-50 MCG/DOSE AEPB Inhale 1 puff into the lungs every 12 (twelve) hours.     HUMALOG KWIKPEN 100 UNIT/ML KwikPen Inject 4 Units into the skin See admin instructions. 4 units in the morning and 4 units late afternoon/supper time     ipratropium-albuterol (DUONEB) 0.5-2.5 (3) MG/3ML SOLN Take 3 mLs by nebulization 2 (two) times daily as needed for wheezing or shortness of breath.     LEVEMIR FLEXTOUCH 100 UNIT/ML Pen Inject 7 Units into the skin at bedtime.     levothyroxine (SYNTHROID) 100 MCG tablet 1 tablet in the morning Mon - Sat; take 1/2 tab on Sun. Take pill on an empty stomach     LORazepam (ATIVAN) 0.5 MG tablet Take 0.25-0.5 mg by mouth daily as needed for anxiety.     mirtazapine (REMERON SOL-TAB) 15 MG disintegrating tablet Take 15 mg by mouth at bedtime.     nitroGLYCERIN (NITROSTAT) 0.4 MG SL tablet Place 1 tablet (0.4 mg total) under the tongue every 5 (five) minutes as needed for chest pain. 25 tablet 3   rosuvastatin (CRESTOR) 10 MG tablet Take 10 mg by mouth at bedtime.     valsartan-hydrochlorothiazide (DIOVAN-HCT) 80-12.5 MG tablet Take 1 tablet by mouth daily.     Vitamin D, Ergocalciferol, (DRISDOL) 50000 UNITS CAPS Take 50,000 Units by mouth every Monday.      Current Facility-Administered Medications  Medication Dose Route Frequency Provider Last Rate Last Admin   0.9 %  sodium chloride infusion  500 mL Intravenous Continuous David Shipper, MD        Allergies:   Azithromycin and Doxycycline   Social History:  The patient  reports that he quit smoking about 16 years ago. His smoking use included cigarettes. He has a 75.00 pack-year smoking history. He has never been exposed to tobacco smoke. He has never used smokeless tobacco. He reports that he does not drink alcohol and does not use drugs.   Family  History:  The patient's family history includes Breast cancer in his mother; Diabetes in his brother and paternal uncle; Heart attack in his brother; Heart disease in his brother; Heart failure in his mother; Pneumonia in his father; Stomach cancer in his mother.    ROS:  Please see the history of present illness.   Otherwise, review of systems is positive for none.   All other systems are reviewed and negative.    PHYSICAL EXAM: VS:  BP (!) 120/56    Pulse 63    Ht 6' (1.829 m)    Wt 176 lb 6.4 oz (80 kg)  SpO2 94%    BMI 23.92 kg/m  , BMI Body mass index is 23.92 kg/m. GEN: Well nourished, well developed, in no acute distress  HEENT: normal  Neck: no JVD, carotid bruits, or masses Cardiac: RRR; no murmurs, rubs, or gallops,no edema  Respiratory:  clear to auscultation bilaterally, normal work of breathing GI: soft, nontender, nondistended, + BS MS: no deformity or atrophy  Skin: warm and dry Neuro:  Strength and sensation are intact Psych: euthymic mood, full affect  EKG:  EKG is ordered today. Personal review of the ekg ordered shows sinus rhythm, rate 63, PVCs  Recent Labs: 11/12/2021: TSH 0.058 11/14/2021: ALT 53; B Natriuretic Peptide 157.2; BUN 58; Creatinine, Ser 1.74; Hemoglobin 10.9; Magnesium 2.2; Platelets 192; Potassium 4.2; Sodium 139    Lipid Panel     Component Value Date/Time   CHOL 92 06/14/2012 0725   TRIG 66 06/14/2012 0725   HDL 34 (L) 06/14/2012 0725   CHOLHDL 2.7 06/14/2012 0725   VLDL 13 06/14/2012 0725   LDLCALC 45 06/14/2012 0725     Wt Readings from Last 3 Encounters:  02/15/22 176 lb 6.4 oz (80 kg)  01/19/22 176 lb (79.8 kg)  12/29/21 178 lb 3.2 oz (80.8 kg)      Other studies Reviewed: Additional studies/ records that were reviewed today include: TTE 11/10/21  Review of the above records today demonstrates:   1. Left ventricular ejection fraction, by estimation, is 60 to 65%. The  left ventricle has normal function. The left ventricle  has no regional  wall motion abnormalities. Left ventricular diastolic function could not  be evaluated.   2. Right ventricular systolic function is normal. The right ventricular  size is normal.   3. Left atrial size was severely dilated.   4. Right atrial size was moderately dilated.   5. The mitral valve is normal in structure. No evidence of mitral valve  regurgitation. No evidence of mitral stenosis.   6. The aortic valve is normal in structure. There is mild calcification  of the aortic valve. There is mild thickening of the aortic valve. Aortic  valve regurgitation is not visualized. Mild aortic valve stenosis. Aortic  valve area, by VTI measures 1.86  cm. Aortic valve mean gradient measures 11.0 mmHg. Aortic valve Vmax  measures 2.25 m/s.   7. The inferior vena cava is dilated in size with >50% respiratory  variability, suggesting right atrial pressure of 8 mmHg.   Myioview 12/26/21   No ST deviation was noted.   Left ventricular function is normal. Nuclear stress EF: 58 %. The left ventricular ejection fraction is normal (55-65%). End diastolic cavity size is normal.   There is very mild attenuation of the inferior apical wall on the upright stress images.   This same defect was seen on the previous study in 2015.   Prior study available for comparison from 10/08/2014 which showed a fixed inferior apical defect at stress and rest.   The upright stress images on this study  show only minimal attenuation of the inferioapical region.  The LV contractility is normal .   The study is normal. The study is low risk.  Cardiac monitor 02/07/2022 personally reviewed Patient had a minimum heart rate of 21 bpm, maximum heart rate of 108 bpm, and average heart rate of 60 bpm. Predominant underlying rhythm was sinus rhythm. One run of supraventricular tachycardia occurred lasting 18 beats at longest with a max rate of 104 bpm at fastest. Isolated PACs were rare (<  1.0%). Isolated PVCs were  frequent (9.6%). Multiple episode of complete heart block (nocturnal and asymptomatic). Triggered and diary events associated with sinus rhythm, PAC, and PVC.    ASSESSMENT AND PLAN:  1.  PVCs: 9.6% on most recent cardiac monitor.  Fortunately he is asymptomatic from his PVCs.  He did have some bradycardia  on his monitor, but his bradycardia was all nocturnal.  He is asymptomatic and has not had any syncope or near syncope.  He is unaware of his PVCs.  Due to that, we Trinidi Toppins hold off on further therapy.  Based on the fact that his bradycardia was at night, we Wyatt Thorstenson also hold off on medication changes.  If he does develop bradycardia during the day, would adjust his diltiazem.  No indication for pacemaker implant at this time.  If he becomes symptomatic from his PVCs, amiodarone or mexiletine would be reasonable options.  2.  Thoracic aortic aneurysm: Has remained stable.  Plan per primary cardiology.  Case discussed with primary cardiology  Current medicines are reviewed at length with the patient today.   The patient does not have concerns regarding his medicines.  The following changes were made today:  none  Labs/ tests ordered today include:  Orders Placed This Encounter  Procedures   EKG 12-Lead     Disposition:   FU with Carolee Channell as needed d  Signed, Marayah Higdon Meredith Leeds, MD  02/15/2022 3:05 PM     [address] [place] [id number] [phone] (office) [phone] (fax)

## 2022-02-28 ENCOUNTER — Encounter: Payer: Self-pay | Admitting: Podiatry

## 2022-02-28 ENCOUNTER — Other Ambulatory Visit: Payer: Self-pay

## 2022-02-28 ENCOUNTER — Ambulatory Visit: Payer: Medicare Other | Admitting: Podiatry

## 2022-02-28 DIAGNOSIS — I130 Hypertensive heart and chronic kidney disease with heart failure and stage 1 through stage 4 chronic kidney disease, or unspecified chronic kidney disease: Secondary | ICD-10-CM | POA: Insufficient documentation

## 2022-02-28 DIAGNOSIS — F419 Anxiety disorder, unspecified: Secondary | ICD-10-CM | POA: Insufficient documentation

## 2022-02-28 DIAGNOSIS — B351 Tinea unguium: Secondary | ICD-10-CM | POA: Diagnosis not present

## 2022-02-28 DIAGNOSIS — M79676 Pain in unspecified toe(s): Secondary | ICD-10-CM | POA: Diagnosis not present

## 2022-02-28 DIAGNOSIS — E1142 Type 2 diabetes mellitus with diabetic polyneuropathy: Secondary | ICD-10-CM

## 2022-02-28 DIAGNOSIS — D689 Coagulation defect, unspecified: Secondary | ICD-10-CM

## 2022-02-28 DIAGNOSIS — M545 Low back pain, unspecified: Secondary | ICD-10-CM | POA: Insufficient documentation

## 2022-02-28 DIAGNOSIS — Z9981 Dependence on supplemental oxygen: Secondary | ICD-10-CM | POA: Insufficient documentation

## 2022-02-28 NOTE — Progress Notes (Signed)
This patient returns to my office for at risk foot care.  This patient requires this care by a professional since this patient will be at risk due to having chronic kidney disease, thrombocytopenia and type 2 diabetes.   Patient is taking plavix.    This patient is unable to cut nails himself since the patient cannot reach his nails.These nails are painful walking and wearing shoes.   This patient presents for at risk foot care today.  ? ?General Appearance  Alert, conversant and in no acute stress. ? ?Vascular  Dorsalis pedis and posterior tibial  pulses are weakly  palpable  bilaterally.  Capillary return is within normal limits  bilaterally. Temperature is within normal limits  Bilaterally.  Venous stasis  B/L. ? ?Neurologic  Senn-Weinstein monofilament wire test absent   bilaterally. Muscle power within normal limits bilaterally. ? ?Nails Thick disfigured discolored nails with subungual debris  from hallux to fifth toes bilaterally. No evidence of bacterial infection or drainage bilaterally. ? ?Orthopedic  No limitations of motion  feet .  No crepitus or effusions noted.  HAV  B/L.  Hammer toes  B/L.  Exostosis IPJ left hallux. ? ?Skin  normotropic skin with no porokeratosis noted bilaterally.  No signs of infections or ulcers noted.   Porokeratosis sub 5th  Left foot. ? ?Onychomycosis  Pain in right toes  Pain in left toes ? ?Consent was obtained for treatment procedures.   Mechanical debridement of nails 1-5  bilaterally performed with a nail nipper.  Filed with dremel without incident.  Patient requests future appointments in [place]. ? ? ?Return office visit   10 weeks                  Told patient to return for periodic foot care and evaluation due to potential at risk complications. ? ? ?Gardiner Barefoot DPM  ?

## 2022-05-31 ENCOUNTER — Ambulatory Visit (INDEPENDENT_AMBULATORY_CARE_PROVIDER_SITE_OTHER): Payer: Medicare Other | Admitting: Podiatry

## 2022-05-31 ENCOUNTER — Encounter: Payer: Self-pay | Admitting: Podiatry

## 2022-05-31 DIAGNOSIS — M79675 Pain in left toe(s): Secondary | ICD-10-CM

## 2022-05-31 DIAGNOSIS — B351 Tinea unguium: Secondary | ICD-10-CM | POA: Diagnosis not present

## 2022-05-31 DIAGNOSIS — E1142 Type 2 diabetes mellitus with diabetic polyneuropathy: Secondary | ICD-10-CM | POA: Diagnosis not present

## 2022-05-31 DIAGNOSIS — M79674 Pain in right toe(s): Secondary | ICD-10-CM | POA: Diagnosis not present

## 2022-05-31 DIAGNOSIS — D689 Coagulation defect, unspecified: Secondary | ICD-10-CM

## 2022-05-31 NOTE — Progress Notes (Signed)
This patient returns to my office for at risk foot care.  This patient requires this care by a professional since this patient will be at risk due to having chronic kidney disease, thrombocytopenia and type 2 diabetes.   Patient is taking plavix.    This patient is unable to cut nails himself since the patient cannot reach his nails.These nails are painful walking and wearing shoes.   This patient presents for at risk foot care today.   General Appearance  Alert, conversant and in no acute stress.  Vascular  Dorsalis pedis and posterior tibial  pulses are weakly  palpable  bilaterally.  Capillary return is within normal limits  bilaterally. Temperature is within normal limits  Bilaterally.  Venous stasis  B/L.  Neurologic  Senn-Weinstein monofilament wire test absent   bilaterally. Muscle power within normal limits bilaterally.  Nails Thick disfigured discolored nails with subungual debris  from hallux to fifth toes bilaterally. No evidence of bacterial infection or drainage bilaterally.  Orthopedic  No limitations of motion  feet .  No crepitus or effusions noted.  HAV  B/L.  Hammer toes  B/L.  Exostosis IPJ left hallux.  Skin  normotropic skin with no porokeratosis noted bilaterally.  No signs of infections or ulcers noted.   Porokeratosis sub 5th  Left foot.  Onychomycosis  Pain in right toes  Pain in left toes  Consent was obtained for treatment procedures.   Mechanical debridement of nails 1-5  bilaterally performed with a nail nipper.  Filed with dremel without incident.  Patient requests future appointments in [place].   Return office visit   10 weeks                  Told patient to return for periodic foot care and evaluation due to potential at risk complications.   Lorenda Peck DPM

## 2022-07-06 ENCOUNTER — Ambulatory Visit: Payer: Medicare Other | Admitting: Pulmonary Disease

## 2022-07-06 ENCOUNTER — Encounter: Payer: Self-pay | Admitting: Pulmonary Disease

## 2022-07-06 VITALS — BP 120/68 | HR 57 | Ht 72.0 in | Wt 170.4 lb

## 2022-07-06 DIAGNOSIS — J9601 Acute respiratory failure with hypoxia: Secondary | ICD-10-CM

## 2022-07-06 DIAGNOSIS — J432 Centrilobular emphysema: Secondary | ICD-10-CM

## 2022-07-06 DIAGNOSIS — G4734 Idiopathic sleep related nonobstructive alveolar hypoventilation: Secondary | ICD-10-CM

## 2022-07-06 NOTE — Progress Notes (Unsigned)
Synopsis: Referred in December 2022 for Pneumonia by David Kelp, NP  Subjective:   PATIENT ID: David [name] GENDER: male DOB: [birth year], MRN: [id number]  HPI  Chief Complaint  Patient presents with   Follow-up    6 mo f/u for emphysema. States he has been well since last visit. Has weaned himself off of oxygen completely.    David [name] is an 88 year old male, former smoker with DMII, hypertension, CVA, and COPD who returns to pulmonary clinic for pneumonia and emphysema.   He reports improvement in his chest congestion since last visit with the addition of Famotidine '20mg'$  at bedtime and using a wedge pillow for GERD.  He remains on advair 250-9mg 1 puff twice dialy and Combivent twice daily. He is using duonebs and flutter valve as needed.   OV 12/29/21 He has been doing well since last visit. He complains of chest congestion that is sometimes difficult to cough up. He is using nebulizer treatments and flutter valve which do help. He denies sinus congestion or drainage. He does have reflux symptoms.   He continues to use 2L of supplemental oxygen. He is participating in home physical therapy.  OV 12/01/21 He was admitted 11/18 to 11/22 for acute respiratory failure and pneumonia. He tested positive for covid on 11/15. He was treated with short course of antibiotics and steroids. He was discharged on supplemental O2, 1L at rest and 2L with ambulation.   Patient is feeling somewhat better since hospitalization but he continues to have cough with clear sputum production and exertional shortness of breath.  He has also noticed mild intermittent epistaxis since starting oxygen therapy.  He is using Advair 250-50 MCG 1 puff twice daily, Combivent a couple times a day and DuoNeb nebulizer treatments a couple times per day.  He has flutter valve from hospital that he is using throughout the day as well. He completed course of levaquin yesterday.   Patient reports history of  premature ventricular contractions as well as premature atrial contractions and reports his heart rate can be erratic at times. He has follow up with cardiology in January but is trying to move up this appointment.   He denies seasonal allergies. He denies sinus congestion or nasal drainage. He complains of ear popping on left. Denies ear fullness.   He is a former smoker with 75 pack year history. He quit in 2006.   Past Medical History:  Diagnosis Date   AAA (abdominal aortic aneurysm) ([facility]    AAA (abdominal aortic aneurysm) ([facility]    AAA [place]dominal aortic aneurysm) ([facility]    Atherosclerosis    pt denies this   BPH (benign prostatic hyperplasia)    CKD (chronic kidney disease), stage III (HCC)    family reports caused by scans but that has resolved since CT scans have stopped   Colon cancer ([facility]    COPD (chronic obstructive pulmonary disease) ([facility]    Diabetes mellitus    ED (erectile dysfunction)    Gastroesophageal reflux disease    Heart murmur    Hyperlipidemia    Hypertension    Lung nodules    Neuromuscular disorder ([place]    Neuropathy feet   Osteopenia    Peripheral vascular disease ([facility]    Pneumonia June 2013   Positional vertigo    intermittent    Shortness of breath    chronic per patient   Stroke ([facility]    x2 in 2006   Stroke ([facility]  Thyroid cancer ([place])    Thyroid disease    Type 2 diabetes mellitus (HCC)    Vitamin D deficiency      Family History  Problem Relation Age of Onset   Stomach cancer Mother    Heart failure Mother    Breast cancer Mother    Pneumonia Father    Diabetes Brother    Heart disease Brother        Heart Disease before age 11,  AAA and  Carotid   Heart attack Brother    Diabetes Paternal Uncle      Social History   Socioeconomic History   Marital status: Married    Spouse name: Not on file   Number of children: Not on file   Years of education: Not on file   Highest education level: Not on file  Occupational History    Occupation: Retired    Fish farm manager: RETIRED  Tobacco Use   Smoking status: Former    Packs/day: 1.50    Years: 50.00    Total pack years: 75.00    Types: Cigarettes    Quit date: 05/28/2005    Years since quitting: 17.1    Passive exposure: Never   Smokeless tobacco: Never  Vaping Use   Vaping Use: Never used  Substance and Sexual Activity   Alcohol use: No    Alcohol/week: 0.0 standard drinks of alcohol   Drug use: No   Sexual activity: Not Currently  Other Topics Concern   Not on file  Social History Narrative   Not on file   Social Determinants of Health   Financial Resource Strain: Not on file  Food Insecurity: Not on file  Transportation Needs: Not on file  Physical Activity: Not on file  Stress: Not on file  Social Connections: Not on file  Intimate Partner Violence: Not on file     Allergies  Allergen Reactions   Azithromycin Rash    zpack   Doxycycline Rash     Outpatient Medications Prior to Visit  Medication Sig Dispense Refill   acetaminophen (TYLENOL) 500 MG tablet Take 500-1,000 mg by mouth See admin instructions. Take 500 mg daily at bedtime and may take an additional '500mg'$  tablet if needed for pain     allopurinol (ZYLOPRIM) 100 MG tablet Take 150 mg by mouth daily.     BD PEN NEEDLE NANO 2ND GEN 32G X 4 MM MISC USE WITH INSULIN PENS TO GIVE 4 INJECTIONS DAILY     clopidogrel (PLAVIX) 75 MG tablet Take 75 mg by mouth at bedtime.      COMBIVENT RESPIMAT 20-100 MCG/ACT AERS respimat Inhale 1 puff into the lungs in the morning and at bedtime.     diltiazem (CARDIZEM CD) 240 MG 24 hr capsule Take 1 capsule (240 mg total) by mouth daily. 90 capsule 3   famotidine (PEPCID) 20 MG tablet Take 1 tablet (20 mg total) by mouth at bedtime. 30 tablet 0   fexofenadine (ALLEGRA) 180 MG tablet Take 180 mg by mouth at bedtime.     Fluticasone-Salmeterol (ADVAIR) 250-50 MCG/DOSE AEPB Inhale 1 puff into the lungs every 12 (twelve) hours.     HUMALOG KWIKPEN 100 UNIT/ML  KwikPen Inject 4 Units into the skin See admin instructions. 4 units in the morning and 4 units late afternoon/supper time     ipratropium-albuterol (DUONEB) 0.5-2.5 (3) MG/3ML SOLN Take 3 mLs by nebulization 2 (two) times daily as needed for wheezing or shortness of breath.  LEVEMIR FLEXTOUCH 100 UNIT/ML Pen Inject 7 Units into the skin at bedtime.     levothyroxine (SYNTHROID) 100 MCG tablet 1 tablet in the morning Mon - Sat; take 1/2 tab on Sun. Take pill on an empty stomach     LORazepam (ATIVAN) 0.5 MG tablet Take 0.25-0.5 mg by mouth daily as needed for anxiety.     mirtazapine (REMERON SOL-TAB) 15 MG disintegrating tablet Take 15 mg by mouth at bedtime.     ONETOUCH VERIO test strip SMARTSIG:Via Meter 4 Times Daily PRN     rosuvastatin (CRESTOR) 10 MG tablet Take 10 mg by mouth at bedtime.     valsartan-hydrochlorothiazide (DIOVAN-HCT) 80-12.5 MG tablet Take 1 tablet by mouth daily.     Vitamin D, Ergocalciferol, (DRISDOL) 50000 UNITS CAPS Take 50,000 Units by mouth every Monday.      nitroGLYCERIN (NITROSTAT) 0.4 MG SL tablet Place 1 tablet (0.4 mg total) under the tongue every 5 (five) minutes as needed for chest pain. 25 tablet 3   fluticasone (FLONASE) 50 MCG/ACT nasal spray Place into both nostrils.     Facility-Administered Medications Prior to Visit  Medication Dose Route Frequency Provider Last Rate Last Admin   0.9 %  sodium chloride infusion  500 mL Intravenous Continuous Irene Shipper, MD        Review of Systems  Constitutional:  Negative for chills, fever, malaise/fatigue and weight loss.  HENT:  Negative for congestion, sinus pain and sore throat.   Eyes: Negative.   Respiratory:  Negative for cough, hemoptysis, sputum production and shortness of breath.   Cardiovascular:  Negative for chest pain, palpitations, orthopnea, claudication and leg swelling.  Gastrointestinal:  Negative for abdominal pain, heartburn, nausea and vomiting.  Genitourinary: Negative.    Musculoskeletal:  Negative for joint pain and myalgias.  Skin:  Negative for rash.  Neurological:  Negative for weakness.  Endo/Heme/Allergies: Negative.   Psychiatric/Behavioral: Negative.      Objective:   Vitals:   07/06/22 1038  BP: 120/68  Pulse: (!) 57  SpO2: 98%  Weight: 170 lb 6.4 oz (77.3 kg)  Height: 6' (1.829 m)    Physical Exam Constitutional:      General: He is not in acute distress. HENT:     Head: Normocephalic and atraumatic.  Eyes:     Conjunctiva/sclera: Conjunctivae normal.     Pupils: Pupils are equal, round, and reactive to light.  Cardiovascular:     Rate and Rhythm: Normal rate and regular rhythm.     Pulses: Normal pulses.     Heart sounds: Normal heart sounds. No murmur heard. Pulmonary:     Effort: Pulmonary effort is normal.     Breath sounds: No wheezing, rhonchi or rales.  Musculoskeletal:     Right lower leg: No edema.     Left lower leg: No edema.  Lymphadenopathy:     Cervical: No cervical adenopathy.  Skin:    General: Skin is warm and dry.  Neurological:     General: No focal deficit present.     Mental Status: He is alert.  Psychiatric:        Mood and Affect: Mood normal.        Behavior: Behavior normal.        Thought Content: Thought content normal.        Judgment: Judgment normal.     CBC    Component Value Date/Time   WBC 9.2 11/14/2021 0232   RBC 3.30 (L) 11/14/2021 3500  HGB 10.9 (L) 11/14/2021 0232   HGB 14.1 01/17/2011 0844   HCT 31.6 (L) 11/14/2021 0232   HCT 40.6 01/17/2011 0844   PLT 192 11/14/2021 0232   PLT 152 01/17/2011 0844   MCV 95.8 11/14/2021 0232   MCV 95.3 01/17/2011 0844   MCH 33.0 11/14/2021 0232   MCHC 34.5 11/14/2021 0232   RDW 13.4 11/14/2021 0232   RDW 13.6 01/17/2011 0844   LYMPHSABS 0.4 (L) 11/14/2021 0232   LYMPHSABS 0.9 01/17/2011 0844   MONOABS 0.5 11/14/2021 0232   MONOABS 0.7 01/17/2011 0844   EOSABS 0.0 11/14/2021 0232   EOSABS 0.1 01/17/2011 0844   BASOSABS 0.0  11/14/2021 0232   BASOSABS 0.0 01/17/2011 0844      Latest Ref Rng & Units 11/14/2021    2:32 AM 11/13/2021    2:37 AM 11/12/2021    4:20 AM  BMP  Glucose 70 - 99 mg/dL 191  230  221   BUN 8 - 23 mg/dL 58  59  64   Creatinine 0.61 - 1.24 mg/dL 1.74  1.68  1.83   Sodium 135 - 145 mmol/L 139  137  137   Potassium 3.5 - 5.1 mmol/L 4.2  4.2  4.4   Chloride 98 - 111 mmol/L 110  106  106   CO2 22 - 32 mmol/L '21  22  23   '$ Calcium 8.9 - 10.3 mg/dL 8.5  8.5  8.8    Chest imaging: CXR 11/12/21 Large lung volumes. Emphysema on CT. Vague, peripheral increased bilateral pulmonary opacity is stable from the the portable chest 2 days ago. No pneumothorax or pleural effusion. Stable cardiac size and mediastinal contours. Visualized tracheal air column is within normal limits. No acute osseous abnormality identified. Small surgical clips at the thoracic inlet from thyroidectomy.  CT Chest 11/10/21 Slight progression of the pulmonary emphysema pattern with bilateral lower lobe peripheral patchy ground-glass mild opacities suggesting mild pneumonitis/viral pneumonia.   Stable calcified and noncalcified pulmonary nodules as detailed above.   Aortic and native coronary atherosclerosis.   Aortic Atherosclerosis (ICD10-I70.0) and Emphysema (ICD10-J43.9).  PFT:    Latest Ref Rng & Units 01/31/2022   11:39 AM  PFT Results  FVC-Pre L 3.49   FVC-Predicted Pre % 87   FVC-Post L 3.50   FVC-Predicted Post % 87   Pre FEV1/FVC % % 71   Post FEV1/FCV % % 73   FEV1-Pre L 2.47   FEV1-Predicted Pre % 88   FEV1-Post L 2.57   DLCO uncorrected ml/min/mmHg 11.30   DLCO UNC% % 45   DLCO corrected ml/min/mmHg 11.30   DLCO COR %Predicted % 45   DLVA Predicted % 53   TLC L 5.26   TLC % Predicted % 70   RV % Predicted % 64    Labs:  Path:  Echo 11/10/21: LV EF 60-65%. RV systolic function and size are normal. LA size is severely dilated. RA moderately dilated.   Heart  Catheterization:  Assessment & Plan:   Acute respiratory failure with hypoxia ([place]) - Plan: Ambulatory Referral for DME  Centrilobular emphysema ([place])  Nocturnal hypoxemia  Discussion: Toluwani Yadav is an 88 year old male, former smoker with DMII, hypertension, CVA, and COPD who returns to pulmonary clinic for pneumonia and emphysema.   Review of patient's chest imaging from 2019 until most recently during his hospitalization 11/10/2021 he has diffuse emphysematous changes with some subpleural reticulation and fibrosis that appears to have progressed with his recent COVID-19 infection.  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  The progression of these fibrotic areas is likely responsible for his new oxygen requirements and respiratory failure.  He does not require supplemental oxygen during the day or with ambulation. ONO did show he spent 47 minutes with an SpO2 less than 88%. He does not wish to wear oxygen at night so order will be sent to his DME to cancel his O2.   Patient is to continue Advair 250-50 MCG 1 puff twice daily along with as needed Combivent and DuoNeb nebulizer treatments.  He is to use the flutter valve after each nebulizer treatment for airway clearance.  He is to continue famotidine '20mg'$  at bedtime for reduction in reflux symptoms. He is to continue elevating the head of his bed at night.   He has had improvement in his chest congestion with GERD management.  He is to follow up in 1 year.  David Jackson, MD [facility] Office: [phone]   Current Outpatient Medications:    acetaminophen (TYLENOL) 500 MG tablet, Take 500-1,000 mg by mouth See admin instructions. Take 500 mg daily at bedtime and may take an additional '500mg'$  tablet if needed for pain, Disp: , Rfl:    allopurinol (ZYLOPRIM) 100 MG tablet, Take 150 mg by mouth daily., Disp: , Rfl:    BD PEN NEEDLE NANO 2ND GEN 32G X 4 MM MISC, USE WITH  INSULIN PENS TO GIVE 4 INJECTIONS DAILY, Disp: , Rfl:    clopidogrel (PLAVIX) 75 MG tablet, Take 75 mg by mouth at bedtime. , Disp: , Rfl:    COMBIVENT RESPIMAT 20-100 MCG/ACT AERS respimat, Inhale 1 puff into the lungs in the morning and at bedtime., Disp: , Rfl:    diltiazem (CARDIZEM CD) 240 MG 24 hr capsule, Take 1 capsule (240 mg total) by mouth daily., Disp: 90 capsule, Rfl: 3   famotidine (PEPCID) 20 MG tablet, Take 1 tablet (20 mg total) by mouth at bedtime., Disp: 30 tablet, Rfl: 0   fexofenadine (ALLEGRA) 180 MG tablet, Take 180 mg by mouth at bedtime., Disp: , Rfl:    Fluticasone-Salmeterol (ADVAIR) 250-50 MCG/DOSE AEPB, Inhale 1 puff into the lungs every 12 (twelve) hours., Disp: , Rfl:    HUMALOG KWIKPEN 100 UNIT/ML KwikPen, Inject 4 Units into the skin See admin instructions. 4 units in the morning and 4 units late afternoon/supper time, Disp: , Rfl:    ipratropium-albuterol (DUONEB) 0.5-2.5 (3) MG/3ML SOLN, Take 3 mLs by nebulization 2 (two) times daily as needed for wheezing or shortness of breath., Disp: , Rfl:    LEVEMIR FLEXTOUCH 100 UNIT/ML Pen, Inject 7 Units into the skin at bedtime., Disp: , Rfl:    levothyroxine (SYNTHROID) 100 MCG tablet, 1 tablet in the morning Mon - Sat; take 1/2 tab on Sun. Take pill on an empty stomach, Disp: , Rfl:    LORazepam (ATIVAN) 0.5 MG tablet, Take 0.25-0.5 mg by mouth daily as needed for anxiety., Disp: , Rfl:    mirtazapine (REMERON SOL-TAB) 15 MG disintegrating tablet, Take 15 mg by mouth at bedtime., Disp: , Rfl:    ONETOUCH VERIO test strip, SMARTSIG:Via Meter 4 Times Daily PRN, Disp: , Rfl:    rosuvastatin (CRESTOR) 10 MG tablet, Take 10 mg by mouth at bedtime., Disp: , Rfl:    valsartan-hydrochlorothiazide (DIOVAN-HCT) 80-12.5 MG tablet, Take 1 tablet by mouth daily., Disp: , Rfl:    Vitamin D, Ergocalciferol, (DRISDOL) 50000 UNITS CAPS, Take 50,000 Units  by mouth every Monday. , Disp: , Rfl:    nitroGLYCERIN (NITROSTAT) 0.4 MG SL tablet,  Place 1 tablet (0.4 mg total) under the tongue every 5 (five) minutes as needed for chest pain., Disp: 25 tablet, Rfl: 3  Current Facility-Administered Medications:    0.9 %  sodium chloride infusion, 500 mL, Intravenous, Continuous, Irene Shipper, MD

## 2022-07-06 NOTE — Patient Instructions (Signed)
Continue advair 1 puff twice daily  Continue Combivent twice daily  Continue duoneb nebulizer treatments as needed with flutter valve  We will cancel your oxygen orders since you are not using it anymore  Follow up in 1 year

## 2022-07-07 ENCOUNTER — Encounter: Payer: Self-pay | Admitting: Pulmonary Disease

## 2022-07-13 ENCOUNTER — Telehealth: Payer: Self-pay | Admitting: Pulmonary Disease

## 2022-07-13 DIAGNOSIS — J432 Centrilobular emphysema: Secondary | ICD-10-CM

## 2022-07-13 NOTE — Telephone Encounter (Signed)
Spoke with pt's spouse who states that [employer] has not received an order to have pt's O2 discontinued. Dr. Erin Fulling, please advise if you are okay with [place] sending order to DME to have pt's O2 picked up.

## 2022-07-17 NOTE — Telephone Encounter (Signed)
Order place dot cancel oxygen with approval from [place]. Nothing further needed

## 2022-07-17 NOTE — Telephone Encounter (Signed)
Shelvie wife states [facility] has not received order to cancel oxygen. Shel[place] phone number is [phone].

## 2022-07-17 NOTE — Telephone Encounter (Signed)
Yes, ok to place order to cancel o2.  Thanks, JD

## 2022-07-23 ENCOUNTER — Telehealth: Payer: Self-pay | Admitting: Pulmonary Disease

## 2022-07-23 NOTE — Telephone Encounter (Signed)
I called and spoke with the wife and let her know that I would reach out to someone about the oxygen and she was appreciative.  I have left a message with Melissa and a secure chat with Andee Poles.   Waiting on a response.

## 2022-07-24 NOTE — Telephone Encounter (Signed)
Message Received: David [name], Beatris Ship, CMA; Stephannie Peters I have an order in to pick it up :)   I have let the wife know that I have reached out and someone should be coming to pick up the equipment. She was appreciative. Nothing further needed.

## 2022-07-27 ENCOUNTER — Encounter: Payer: Self-pay | Admitting: Internal Medicine

## 2022-07-27 ENCOUNTER — Ambulatory Visit: Payer: Medicare Other | Admitting: Internal Medicine

## 2022-07-27 VITALS — BP 102/58 | HR 57 | Ht 72.0 in | Wt 171.0 lb

## 2022-07-27 DIAGNOSIS — E785 Hyperlipidemia, unspecified: Secondary | ICD-10-CM

## 2022-07-27 DIAGNOSIS — I700 Atherosclerosis of aorta: Secondary | ICD-10-CM

## 2022-07-27 DIAGNOSIS — J441 Chronic obstructive pulmonary disease with (acute) exacerbation: Secondary | ICD-10-CM

## 2022-07-27 DIAGNOSIS — E1169 Type 2 diabetes mellitus with other specified complication: Secondary | ICD-10-CM

## 2022-07-27 DIAGNOSIS — I350 Nonrheumatic aortic (valve) stenosis: Secondary | ICD-10-CM

## 2022-07-27 DIAGNOSIS — I491 Atrial premature depolarization: Secondary | ICD-10-CM

## 2022-07-27 DIAGNOSIS — I10 Essential (primary) hypertension: Secondary | ICD-10-CM

## 2022-07-27 DIAGNOSIS — E119 Type 2 diabetes mellitus without complications: Secondary | ICD-10-CM

## 2022-07-27 DIAGNOSIS — I493 Ventricular premature depolarization: Secondary | ICD-10-CM | POA: Diagnosis not present

## 2022-07-27 MED ORDER — DILTIAZEM HCL ER COATED BEADS 120 MG PO CP24: 120.0000 mg | ORAL_CAPSULE | Freq: Every day | ORAL | 3 refills | Status: DC

## 2022-07-27 NOTE — Patient Instructions (Signed)
Medication Instructions:  Your physician has recommended you make the following change in your medication:  DECREASE: diltiazem (Cardizem) to 120 mg by mouth once daily  *If you need a refill on your cardiac medications before your next appointment, please call your pharmacy*   Lab Work: NONE If you have labs (blood work) drawn today and your tests are completely normal, you will receive your results only by: [place] (if you have MyChart) OR A paper copy in the mail If you have any lab test that is abnormal or we need to change your treatment, we will call you to review the results.   Testing/Procedures: MARCH 2024- Your physician has requested that you have an echocardiogram. Echocardiography is a painless test that uses sound waves to create images of your heart. It provides your doctor with information about the size and shape of your heart and how well your heart's chambers and valves are working. This procedure takes approximately one hour. There are no restrictions for this procedure.    Follow-Up: At [facility], you and your health needs are our priority.  As part of our continuing mission to provide you with exceptional heart care, we have created designated Provider Care Teams.  These Care Teams include your primary Cardiologist (physician) and Advanced Practice Providers (APPs -  Physician Assistants and Nurse Practitioners) who all work together to provide you with the care you need, when you need it.  We recommend signing up for the patient portal called "MyChart".  Sign up information is provided on this After Visit Summary.  MyChart is used to connect with patients for Virtual Visits (Telemedicine).  Patients are able to view lab/test results, encounter notes, upcoming appointments, etc.  Non-urgent messages can be sent to your provider as well.   To learn more about what you can do with MyChart, go to [email or web address].    Your next appointment:   1  year(s)  The format for your next appointment:   In Person  Provider:   Werner Lean, MD  or Melina Copa, PA-C, Ermalinda Barrios, PA-C, or Christen Bame, NP        Important Information About Sugar

## 2022-07-27 NOTE — Progress Notes (Signed)
Cardiology Office Note:    Date:  07/27/2022   ID:  David [name], DOB March 26, 1931, MRN [id number]  PCP:  David [name], [place]  Cardiologist:  David Lean, MD  Advanced Practice Provider:  No care team member to display Electrophysiologist:  None      Referring MD: David Infante, MD   CC: follow up  PVCs  History of Present Illness:    David [name] is a 88 y.o. 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   2022: Mild AS and normal LV function.  Had frequent PVCs and started diltiazem now on 240 mg.  Has repeat stress test with no changes.   2023: Saw EP no plans for asymptomatic PVC treatment, if day time symptomatic bradycardia cut back CCB, if symptoms PVCs amiodarone or mexiletine  Patient notes that he is doing well.   Since last visit notes that he is still ride mowing.  Still doing his exercises. There are no interval hospital/ED visit.    No chest pain or pressure .  No COPD and no PND/Orthopnea.  No weight gain or leg swelling.  No palpitations or syncope.   Past Medical History:  Diagnosis Date   AAA (abdominal aortic aneurysm) ([place])    AAA (abdominal aortic aneurysm) ([place])    AAA (abdominal aortic aneurysm) ([place])    Atherosclerosis    pt denies this   BPH (benign prostatic hyperplasia)    CKD (chronic kidney disease), stage III (HCC)    family reports caused by scans but that has resolved since CT scans have stopped   Colon cancer ([place])    COPD (chronic obstructive pulmonary disease) ([place])    Diabetes mellitus    ED (erectile dysfunction)    Gastroesophageal reflux disease    Heart murmur    Hyperlipidemia    Hypertension    Lung nodules    Neuromuscular disorder ([place])    Neuropathy feet   Osteopenia    Peripheral vascular disease  ([place])    Pneumonia June 2013   Positional vertigo    intermittent    Shortness of breath    chronic per patient   Stroke [facility])    x2 in 2006   Stroke [facility])    Thyroid cancer ([place])    Thyroid disease    Type 2 diabetes mellitus ([place])    Vitamin D deficiency     Past Surgical History:  Procedure Laterality Date   ABDOMINAL AORTIC ANEURYSM REPAIR N/A 11/08/2014   Procedure: ANEURYSM ABDOMINAL AORTIC REPAIR;  Surgeon: Rosetta Posner, MD;  Location: [facility];  Service: Vascular;  Laterality: N/A;   ABDOMINAL AORTIC ANEURYSM REPAIR     COLON RESECTION  2007   COLON SURGERY  2006   Resection   CYSTOSCOPY W/ RETROGRADES Bilateral 12/30/2020   Procedure: CYSTOSCOPY WITH BILATERAL RETROGRADE;  Surgeon: Irine Seal, MD;  Location: [facility];  Service: Urology;  Laterality: Bilateral;   CYSTOSCOPY WITH BIOPSY N/A 12/30/2020   Procedure: CYSTOSCOPY WITH BIOPSY AND FULGURATION;  Surgeon: Irine Seal, MD;  Location: [facility];  Service: Urology;  Laterality: N/A;   SEPTOPLASTY     THYROIDECTOMY  2007   TRANSURETHRAL RESECTION OF BLADDER TUMOR WITH MITOMYCIN-C N/A 02/12/2017   Procedure: TRANSURETHRAL RESECTION OF BLADDER TUMOR;  Surgeon: Irine Seal,  MD;  Location: [facility];  Service: Urology;  Laterality: N/A;    Current Medications: Current Meds  Medication Sig   acetaminophen (TYLENOL) 500 MG tablet Take 500-1,000 mg by mouth See admin instructions. Take 500 mg daily at bedtime and may take an additional '500mg'$  tablet if needed for pain   allopurinol (ZYLOPRIM) 100 MG tablet Take 150 mg by mouth daily.   BD PEN NEEDLE NANO 2ND GEN 32G X 4 MM MISC USE WITH INSULIN PENS TO GIVE 4 INJECTIONS DAILY   bifidobacterium infantis (ALIGN) capsule daily.   clopidogrel (PLAVIX) 75 MG tablet Take 75 mg by mouth at bedtime.    COMBIVENT RESPIMAT 20-100 MCG/ACT AERS respimat Inhale 1 puff into the lungs in the morning and at bedtime.   diltiazem (CARDIZEM CD) 120 MG 24 hr capsule Take 1 capsule (120 mg total) by mouth  daily.   famotidine (PEPCID) 20 MG tablet Take 1 tablet (20 mg total) by mouth at bedtime.   fexofenadine (ALLEGRA) 180 MG tablet Take 180 mg by mouth at bedtime.   Fluticasone-Salmeterol (ADVAIR) 250-50 MCG/DOSE AEPB Inhale 1 puff into the lungs every 12 (twelve) hours.   HUMALOG KWIKPEN 100 UNIT/ML KwikPen Inject 3 Units into the skin See admin instructions. 4 units in the morning and 4 units late afternoon/supper time   ipratropium-albuterol (DUONEB) 0.5-2.5 (3) MG/3ML SOLN Take 3 mLs by nebulization 2 (two) times daily as needed for wheezing or shortness of breath.   LEVEMIR FLEXTOUCH 100 UNIT/ML Pen Inject 5 Units into the skin at bedtime.   levothyroxine (SYNTHROID) 100 MCG tablet daily.   LORazepam (ATIVAN) 0.5 MG tablet Take 0.25-0.5 mg by mouth daily as needed for anxiety.   mirtazapine (REMERON SOL-TAB) 15 MG disintegrating tablet Take 15 mg by mouth at bedtime.   ONETOUCH VERIO test strip SMARTSIG:Via Meter 4 Times Daily PRN   rosuvastatin (CRESTOR) 10 MG tablet Take 10 mg by mouth at bedtime.   valsartan-hydrochlorothiazide (DIOVAN-HCT) 80-12.5 MG tablet Take 1 tablet by mouth daily.   Vitamin D, Ergocalciferol, (DRISDOL) 50000 UNITS CAPS Take 50,000 Units by mouth every Monday.    [DISCONTINUED] diltiazem (CARDIZEM CD) 240 MG 24 hr capsule Take 1 capsule (240 mg total) by mouth daily.   Current Facility-Administered Medications for the 07/27/22 encounter (Office Visit) with Rudean Haskell A, MD  Medication   0.9 %  sodium chloride infusion     Allergies:   Azithromycin and Doxycycline   Social History   Socioeconomic History   Marital status: Married    Spouse name: Not on file   Number of children: Not on file   Years of education: Not on file   Highest education level: Not on file  Occupational History   Occupation: Retired    Fish farm manager: RETIRED  Tobacco Use   Smoking status: Former    Packs/day: 1.50    Years: 50.00    Total pack years: 75.00    Types:  Cigarettes    Quit date: 05/28/2005    Years since quitting: 17.1    Passive exposure: Never   Smokeless tobacco: Never  Vaping Use   Vaping Use: Never used  Substance and Sexual Activity   Alcohol use: No    Alcohol/week: 0.0 standard drinks of alcohol   Drug use: No   Sexual activity: Not Currently  Other Topics Concern   Not on file  Social History Narrative   Not on file   Social Determinants of Health   Financial Resource Strain: Not on  file  Food Insecurity: Not on file  Transportation Needs: Not on file  Physical Activity: Not on file  Stress: Not on file  Social Connections: Not on file    Family History: The patient's family history includes Breast cancer in his mother; Diabetes in his brother and paternal uncle; Heart attack in his brother; Heart disease in his brother; Heart failure in his mother; Pneumonia in his father; Stomach cancer in his mother.  ROS:   Please see the history of present illness.    All other systems reviewed and are negative.  EKGs/Labs/Other Studies Reviewed:    The following studies were reviewed today:  EKG:   07/27/22: Junctional bradycardia  with bigeminy PVCs 01/19/22:  Sinus bradycardia 1st HB occasional PVCs 12/07/21: SR rate 81 1st HB PACs with no PVCs 04/24/21: SR rate 71 1st HB no PVCs 04/17/21: SR rate 63 with PACs, and PVCs  Cardiac Event Monitoring: Date: 05/19/21 Results: Patient had a minimum heart rate of 21 bpm, maximum heart rate of 203 bpm, and average heart rate of 64 bpm. Predominant underlying rhythm was Sinus Rhythm. Two runs of non-sustained ventricular tachycardia occurred lasting 7 beats at longest with a max rate of 203 bpm at fastest. Two runs of supraventricular tachycardia occurred lasting 8 beats at longest with a max rate of 143 bpm at fastest. Isolated PACs were rare (<1.0%), with rare couplets and triplets present. Isolated PVCs were frequent (7.7% [id number]), with rare couplets and triplets present. First  Degree AV Block was present, in addition there were 2 nocturnal pauses that occurred (2:1 HB and high grade AV block) with the longest lasting 3.8 secs. Triggered and diary events associated with sinus rhythm and PVCs.   Frequent PVCs with rare nocturnal heart block  Transthoracic Echocardiogram: Date: 11/10/21 Results:  1. Left ventricular ejection fraction, by estimation, is 60 to 65%. The  left ventricle has normal function. The left ventricle has no regional  wall motion abnormalities. Left ventricular diastolic function could not  be evaluated.   2. Right ventricular systolic function is normal. The right ventricular  size is normal.   3. Left atrial size was severely dilated.   4. Right atrial size was moderately dilated.   5. The mitral valve is normal in structure. No evidence of mitral valve  regurgitation. No evidence of mitral stenosis.   6. The aortic valve is normal in structure. There is mild calcification  of the aortic valve. There is mild thickening of the aortic valve. Aortic  valve regurgitation is not visualized. Mild aortic valve stenosis. Aortic  valve area, by VTI measures 1.86  cm. Aortic valve mean gradient measures 11.0 mmHg. Aortic valve Vmax  measures 2.25 m/s.   7. The inferior vena cava is dilated in size with >50% respiratory  variability, suggesting right atrial pressure of 8 mmHg.    Non Cardiac CT: Date: 08/17/20 Results: Significant aortic atherosclerosis CAC in LAD and LCx   NM Stress Testing: Date: 10/08/2014 Results: Low risk myoveiw   Recent Labs: 11/12/2021: TSH 0.058 11/14/2021: ALT 53; B Natriuretic Peptide 157.2; BUN 58; Creatinine, Ser 1.74; Hemoglobin 10.9; Magnesium 2.2; Platelets 192; Potassium 4.2; Sodium 139  Recent Lipid Panel    Component Value Date/Time   CHOL 92 06/14/2012 0725   TRIG 66 06/14/2012 0725   HDL 34 (L) 06/14/2012 0725   CHOLHDL 2.7 06/14/2012 0725   VLDL 13 06/14/2012 0725   LDLCALC 45 06/14/2012  0725    Physical Exam:  VS:  BP (!) 102/58   Pulse (!) 57   Ht 6' (1.829 m)   Wt 171 lb (77.6 kg)   SpO2 98%   BMI 23.19 kg/m     Wt Readings from Last 3 Encounters:  07/27/22 171 lb (77.6 kg)  07/06/22 170 lb 6.4 oz (77.3 kg)  02/15/22 176 lb 6.4 oz (80 kg)    Gen: no distress, Elderly male   Neck: No JVD,  Cardiac: No Rubs or Gallops, systolic Murmur, irregular bradycardia, +2 radial pulses Respiratory: Clear to auscultation bilaterally, decreased lung volumes bilaterally normal  respiratory rate GI: Soft, nontender, non-distended  MS: No  edema;  moves all extremities Integument: Skin feels warm Neuro:  At time of evaluation, alert and oriented to person/place/time/situation  Psych: Normal affect, patient feels better  ASSESSMENT:    1. PVC's (premature ventricular contractions)   2. Aortic stenosis, mild   3. PAC (premature atrial contraction)   4. PVC (premature ventricular contraction)   5. Aortic atherosclerosis ([place])   6. COPD with acute exacerbation ([place])   7. Diabetes mellitus with coincident hypertension ([place])   8. Hyperlipidemia associated with type 2 diabetes mellitus (HCC)     PLAN:    PVCs and PACs HTN with DM COPD - seen by EP - will decrease diltiazem dose to 120 mg PO XL; no sx of PVCs, and low BP today; if BP goes up, then they can use the 240 mg they have until it runs out; otherwise decrease dose (wife is a Marine scientist)  - if symptomatic amiodarone or mexiletine  Mild AS AAA s/p repair- sees VVS Mild TAA- 4.1 cm at stable CKD Stage IIIb - has been released from TCT given age and stability of the AA; reasonable and will not make further changes - Echo in spring of 2024 then follow up (AS)  Prior Stroke PAD Aortic atherosclerosis HLD with DM - continue plavix, on rosuvastatin 10 mg LDL < 55 - continue Diovan-HCT 80/12.5  See in one year me or APP    Medication Adjustments/Labs and Tests Ordered: Current medicines are reviewed at  length with the patient today.  Concerns regarding medicines are outlined above.  Orders Placed This Encounter  Procedures   EKG 12-Lead   ECHOCARDIOGRAM COMPLETE    Meds ordered this encounter  Medications   diltiazem (CARDIZEM CD) 120 MG 24 hr capsule    Sig: Take 1 capsule (120 mg total) by mouth daily.    Dispense:  90 capsule    Refill:  3     Patient Instructions  Medication Instructions:  Your physician has recommended you make the following change in your medication:  DECREASE: diltiazem (Cardizem) to 120 mg by mouth once daily  *If you need a refill on your cardiac medications before your next appointment, please call your pharmacy*   Lab Work: NONE If you have labs (blood work) drawn today and your tests are completely normal, you will receive your results only by: [place] (if you have MyChart) OR A paper copy in the mail If you have any lab test that is abnormal or we need to change your treatment, we will call you to review the results.   Testing/Procedures: MARCH 2024- Your physician has requested that you have an echocardiogram. Echocardiography is a painless test that uses sound waves to create images of your heart. It provides your doctor with information about the size and shape of your heart and how well your heart's chambers and valves are  working. This procedure takes approximately one hour. There are no restrictions for this procedure.    Follow-Up: At [facility], you and your health needs are our priority.  As part of our continuing mission to provide you with exceptional heart care, we have created designated Provider Care Teams.  These Care Teams include your primary Cardiologist (physician) and Advanced Practice Providers (APPs -  Physician Assistants and Nurse Practitioners) who all work together to provide you with the care you need, when you need it.  We recommend signing up for the patient portal called "MyChart".  Sign up information  is provided on this After Visit Summary.  MyChart is used to connect with patients for Virtual Visits (Telemedicine).  Patients are able to view lab/test results, encounter notes, upcoming appointments, etc.  Non-urgent messages can be sent to your provider as well.   To learn more about what you can do with MyChart, go to [email or web address].    Your next appointment:   1 year(s)  The format for your next appointment:   In Person  Provider:   Werner Lean, MD  or Melina Copa, PA-C, Ermalinda Barrios, PA-C, or Christen Bame, NP        Important Information About Sugar         Signed, David Lean, MD  07/27/2022 11:33 AM    [place]

## 2022-08-09 ENCOUNTER — Ambulatory Visit (INDEPENDENT_AMBULATORY_CARE_PROVIDER_SITE_OTHER): Payer: Medicare Other | Admitting: Podiatry

## 2022-08-09 ENCOUNTER — Encounter: Payer: Self-pay | Admitting: Podiatry

## 2022-08-09 DIAGNOSIS — M79676 Pain in unspecified toe(s): Secondary | ICD-10-CM | POA: Diagnosis not present

## 2022-08-09 DIAGNOSIS — B351 Tinea unguium: Secondary | ICD-10-CM | POA: Diagnosis not present

## 2022-08-09 DIAGNOSIS — E1142 Type 2 diabetes mellitus with diabetic polyneuropathy: Secondary | ICD-10-CM | POA: Diagnosis not present

## 2022-08-09 DIAGNOSIS — D689 Coagulation defect, unspecified: Secondary | ICD-10-CM | POA: Diagnosis not present

## 2022-08-09 DIAGNOSIS — Q828 Other specified congenital malformations of skin: Secondary | ICD-10-CM

## 2022-08-09 NOTE — Progress Notes (Signed)
This patient returns to my office for at risk foot care.  This patient requires this care by a professional since this patient will be at risk due to having chronic kidney disease, thrombocytopenia and type 2 diabetes.   Patient is taking plavix.    This patient is unable to cut nails himself since the patient cannot reach his nails.These nails are painful walking and wearing shoes.   This patient presents for at risk foot care today.   General Appearance  Alert, conversant and in no acute stress.  Vascular  Dorsalis pedis and posterior tibial  pulses are weakly  palpable  bilaterally.  Capillary return is within normal limits  bilaterally. Temperature is within normal limits  Bilaterally.  Venous stasis  B/L.  Neurologic  Senn-Weinstein monofilament wire test absent   bilaterally. Muscle power within normal limits bilaterally.  Nails Thick disfigured discolored nails with subungual debris  from hallux to fifth toes bilaterally. No evidence of bacterial infection or drainage bilaterally. Hyperkeratotic tissue noted bilateral plantar fifth metatarsals.   Orthopedic  No limitations of motion  feet .  No crepitus or effusions noted.  HAV  B/L.  Hammer toes  B/L.  Exostosis IPJ left hallux.  Skin  normotropic skin with no porokeratosis noted bilaterally.  No signs of infections or ulcers noted.   Porokeratosis sub 5th  Left foot.  Onychomycosis  Pain in right toes  Pain in left toes  Consent was obtained for treatment procedures.   Mechanical debridement of nails 1-5  bilaterally performed with a nail nipper.  Filed with dremel without incident. Hyperkeratotic tissue debrided without incident x2 bilateral.   Return office visit   10 weeks       Told patient to return for periodic foot care and evaluation due to potential at risk complications.   Giuseppe Duchemin DPM  

## 2022-09-05 ENCOUNTER — Encounter: Payer: Self-pay | Admitting: Internal Medicine

## 2022-10-11 ENCOUNTER — Ambulatory Visit: Payer: Medicare Other | Admitting: Internal Medicine

## 2022-10-18 ENCOUNTER — Ambulatory Visit: Payer: Medicare Other | Admitting: Podiatrist

## 2022-10-18 ENCOUNTER — Encounter: Payer: Self-pay | Admitting: Podiatrist

## 2022-10-18 DIAGNOSIS — B351 Tinea unguium: Secondary | ICD-10-CM | POA: Diagnosis not present

## 2022-10-18 DIAGNOSIS — M79676 Pain in unspecified toe(s): Secondary | ICD-10-CM

## 2022-10-18 DIAGNOSIS — E1142 Type 2 diabetes mellitus with diabetic polyneuropathy: Secondary | ICD-10-CM

## 2022-10-18 DIAGNOSIS — B353 Tinea pedis: Secondary | ICD-10-CM

## 2022-10-18 DIAGNOSIS — Q828 Other specified congenital malformations of skin: Secondary | ICD-10-CM

## 2022-10-18 MED ORDER — KETOCONAZOLE 2 % EX CREA: TOPICAL_CREAM | CUTANEOUS | 2 refills | Status: AC

## 2022-10-18 NOTE — Progress Notes (Signed)
Subjective: David [name] is a 88 y.o. male patient who presents to office today with concern of long,mildly painful nails  while ambulating in shoes; unable to trim.  Patient denies any new cramping, numbness, burning or tingling in the legs or feet. He has seen at the dermatologist recently and relates he has a scab on the top of the right foot he is watching to make sure it goes away. He also has dry and peeling skin on the bottom of his feet and is not currently treating with medication.   Patient Active Problem List   Diagnosis Date Noted   Anxiety 02/28/2022   Hypertensive heart and chronic kidney disease with heart failure and stage 1 through stage 4 chronic kidney disease, or unspecified chronic kidney disease ([place]) 02/28/2022   Low back pain 02/28/2022   Oxygen dependent 02/28/2022   Aortic stenosis, mild 01/19/2022   COPD with acute exacerbation ([place]) 11/09/2021   PVC's (premature ventricular contractions) 04/24/2021   PAC (premature atrial contraction) 04/24/2021   Thoracic aortic aneurysm without rupture ([place]) 04/24/2021   Aortic atherosclerosis ([place]) 04/24/2021   History of stroke 04/24/2021   Hypertension associated with diabetes ([place]) 04/24/2021   Porokeratosis 12/09/2020   Enthesopathy 08/04/2018   Hemoptysis 07/23/2017   Right upper lobe pneumonia 07/23/2017   Malignant tumor of urinary bladder ([place]) 05/06/2017   Allergic rhinitis 10/10/2016   Dysuria 08/29/2016   Benign prostatic hyperplasia with lower urinary tract symptoms 01/12/2016   Cataract 01/12/2016   Herpes zoster without complication 46/95/0722   Chronic diastolic heart failure ([place]) 06/24/2015   CAP (community acquired pneumonia) 06/23/2015   CKD (chronic kidney disease) stage 3, GFR 30-59 ml/min ([place]) 06/23/2015   Hypothyroidism 06/23/2015   Leukocytosis 06/23/2015   Thrombocytopenia ([place]) 06/23/2015   Acute respiratory failure with hypoxia (HCC)    Type 2 diabetes mellitus with diabetic chronic  kidney disease ([place])    Diabetic peripheral neuropathy associated with type 2 diabetes mellitus ([place]) 05/06/2015   Diabetic renal disease ([place]) 05/06/2015   Non-thrombocytopenic purpura ([place]) 05/06/2015   Abdominal aortic aneurysm without rupture ([place]) 11/08/2014   Gout 07/27/2014   Polyneuropathy 09/19/2012   HTN (hypertension) 06/14/2012   Dyslipidemia 06/14/2012   Gastro-esophageal reflux disease without esophagitis 03/07/2010   Hyperlipidemia associated with type 2 diabetes mellitus ([place]) 03/07/2010   Male erectile disorder 03/07/2010   Malignant tumor of thyroid gland ([place]) 03/07/2010   Vitamin D deficiency 03/07/2010   Cerebral artery occlusion with cerebral infarction ([place]) 01/04/2009   Chronic obstructive pulmonary disease ([place]) 01/04/2009   Current Outpatient Medications on File Prior to Visit  Medication Sig Dispense Refill   acetaminophen (TYLENOL) 500 MG tablet Take 500-1,000 mg by mouth See admin instructions. Take 500 mg daily at bedtime and may take an additional '500mg'$  tablet if needed for pain     allopurinol (ZYLOPRIM) 100 MG tablet Take 150 mg by mouth daily.     BD PEN NEEDLE NANO 2ND GEN 32G X 4 MM MISC USE WITH INSULIN PENS TO GIVE 4 INJECTIONS DAILY     bifidobacterium infantis (ALIGN) capsule daily.     clopidogrel (PLAVIX) 75 MG tablet Take 75 mg by mouth at bedtime.      COMBIVENT RESPIMAT 20-100 MCG/ACT AERS respimat Inhale 1 puff into the lungs in the morning and at bedtime.     diltiazem (CARDIZEM CD) 120 MG 24 hr capsule Take 1 capsule (120 mg total) by mouth daily. 90 capsule 3   famotidine (PEPCID) 20 MG  tablet Take 1 tablet (20 mg total) by mouth at bedtime. 30 tablet 0   fexofenadine (ALLEGRA) 180 MG tablet Take 180 mg by mouth at bedtime.     Fluticasone-Salmeterol (ADVAIR) 250-50 MCG/DOSE AEPB Inhale 1 puff into the lungs every 12 (twelve) hours.     HUMALOG KWIKPEN 100 UNIT/ML KwikPen Inject 3 Units into the skin See admin instructions. 4 units in the  morning and 4 units late afternoon/supper time     ipratropium-albuterol (DUONEB) 0.5-2.5 (3) MG/3ML SOLN Take 3 mLs by nebulization 2 (two) times daily as needed for wheezing or shortness of breath.     LEVEMIR FLEXTOUCH 100 UNIT/ML Pen Inject 5 Units into the skin at bedtime.     levothyroxine (SYNTHROID) 100 MCG tablet daily.     LORazepam (ATIVAN) 0.5 MG tablet Take 0.25-0.5 mg by mouth daily as needed for anxiety.     mirtazapine (REMERON SOL-TAB) 15 MG disintegrating tablet Take 15 mg by mouth at bedtime.     nitroGLYCERIN (NITROSTAT) 0.4 MG SL tablet Place 1 tablet (0.4 mg total) under the tongue every 5 (five) minutes as needed for chest pain. 25 tablet 3   ONETOUCH VERIO test strip SMARTSIG:Via Meter 4 Times Daily PRN     rosuvastatin (CRESTOR) 10 MG tablet Take 10 mg by mouth at bedtime.     valsartan-hydrochlorothiazide (DIOVAN-HCT) 80-12.5 MG tablet Take 1 tablet by mouth daily.     Vitamin D, Ergocalciferol, (DRISDOL) 50000 UNITS CAPS Take 50,000 Units by mouth every Monday.      Current Facility-Administered Medications on File Prior to Visit  Medication Dose Route Frequency Provider Last Rate Last Admin   0.9 %  sodium chloride infusion  500 mL Intravenous Continuous Irene Shipper, MD       Allergies  Allergen Reactions   Azithromycin Rash    zpack   Doxycycline Rash     Objective: General: Patient is awake, alert, and oriented x 3 and in no acute distress. Vascular  Dorsalis pedis and posterior tibial  pulses are weakly  palpable  bilaterally.  Capillary return is within normal limits  bilaterally. Temperature is within normal limits  Bilaterally.  Venous stasis  B/L.   Neurologic  Senn-Weinstein monofilament wire test absent   bilaterally. Muscle power within normal limits bilaterally.   Nails Thick disfigured discolored nails with subungual debris  from hallux to fifth toes bilaterally. No evidence of bacterial infection or drainage bilaterally. Hyperkeratotic tissue  noted bilateral plantar fifth metatarsals. Skin  peeling skin in a moccasin like distribution plantar bilateral.  No signs of infections or ulcers noted.   Porokeratosis sub 5th- Left foot.  Small 16m scab present on the dorsal right foot level of the third metatarsal.  Will continue to monitor that it goes away.    Orthopedic  No limitations of motion  feet .  No crepitus or effusions noted.  HAV  B/L.  Hammer toes  B/L.  Exostosis IPJ left hallux.      Assessment and Plan:   ICD-10-CM   1. Pain due to onychomycosis of toenail  B35.1    M79.676     2. Diabetic polyneuropathy associated with type 2 diabetes mellitus (HCC)  E11.42     3. Porokeratosis  Q82.8     4. Tinea pedis of both feet  B35.3        -Examined patient. -Mechanically debrided all nails 1-5 bilateral using sterile nail nipper and filed with sterile dremel without incident  -Answered  all patient questions -Patient to return  in 3 months for continued foot care.  -Patient advised to call the office if any problems or questions arise in the meantime.  Bronson Ing, DPM

## 2022-12-27 ENCOUNTER — Encounter: Payer: Self-pay | Admitting: Podiatry

## 2022-12-27 ENCOUNTER — Ambulatory Visit: Payer: Medicare Other | Admitting: Podiatry

## 2022-12-27 DIAGNOSIS — M79676 Pain in unspecified toe(s): Secondary | ICD-10-CM

## 2022-12-27 DIAGNOSIS — Q828 Other specified congenital malformations of skin: Secondary | ICD-10-CM | POA: Diagnosis not present

## 2022-12-27 DIAGNOSIS — E1142 Type 2 diabetes mellitus with diabetic polyneuropathy: Secondary | ICD-10-CM | POA: Diagnosis not present

## 2022-12-27 DIAGNOSIS — B351 Tinea unguium: Secondary | ICD-10-CM

## 2022-12-27 NOTE — Progress Notes (Signed)
This patient returns to my office for at risk foot care.  This patient requires this care by a professional since this patient will be at risk due to having chronic kidney disease, thrombocytopenia and type 2 diabetes.   Patient is taking plavix.    This patient is unable to cut nails himself since the patient cannot reach his nails.These nails are painful walking and wearing shoes.   This patient presents for at risk foot care today.   General Appearance  Alert, conversant and in no acute stress.  Vascular  Dorsalis pedis and posterior tibial  pulses are weakly  palpable  bilaterally.  Capillary return is within normal limits  bilaterally. Temperature is within normal limits  Bilaterally.  Venous stasis  B/L.  Neurologic  Senn-Weinstein monofilament wire test absent   bilaterally. Muscle power within normal limits bilaterally.  Nails Thick disfigured discolored nails with subungual debris  from hallux to fifth toes bilaterally. No evidence of bacterial infection or drainage bilaterally. Hyperkeratotic tissue noted bilateral plantar fifth metatarsals.   Orthopedic  No limitations of motion  feet .  No crepitus or effusions noted.  HAV  B/L.  Hammer toes  B/L.  Exostosis IPJ left hallux.  Skin  normotropic skin with no porokeratosis noted bilaterally.  No signs of infections or ulcers noted.   Porokeratosis sub 5th  Left foot.  Onychomycosis  Pain in right toes  Pain in left toes  Consent was obtained for treatment procedures.   Mechanical debridement of nails 1-5  bilaterally performed with a nail nipper.  Filed with dremel without incident. Hyperkeratotic tissue debrided without incident x2 bilateral.   Return office visit   10 weeks       Told patient to return for periodic foot care and evaluation due to potential at risk complications.   Lorenda Peck DPM

## 2023-03-04 ENCOUNTER — Ambulatory Visit (HOSPITAL_COMMUNITY): Payer: Medicare Other | Attending: Internal Medicine

## 2023-03-04 DIAGNOSIS — I493 Ventricular premature depolarization: Secondary | ICD-10-CM | POA: Diagnosis not present

## 2023-03-04 DIAGNOSIS — I350 Nonrheumatic aortic (valve) stenosis: Secondary | ICD-10-CM | POA: Insufficient documentation

## 2023-03-04 LAB — ECHOCARDIOGRAM COMPLETE
AR max vel: 1.16 cm2
AV Area VTI: 1.12 cm2
AV Area mean vel: 1.07 cm2
AV Mean grad: 10 mmHg
AV Peak grad: 18.3 mmHg
Ao pk vel: 2.14 m/s
Area-P 1/2: 2.87 cm2
MV M vel: 4.11 m/s
MV Peak grad: 67.6 mmHg
P 1/2 time: 1559 msec
Radius: 0.7 cm
S' Lateral: 3.3 cm

## 2023-03-28 ENCOUNTER — Ambulatory Visit: Payer: Medicare Other | Admitting: Podiatry

## 2023-03-28 ENCOUNTER — Encounter: Payer: Self-pay | Admitting: Podiatry

## 2023-03-28 DIAGNOSIS — E1142 Type 2 diabetes mellitus with diabetic polyneuropathy: Secondary | ICD-10-CM | POA: Diagnosis not present

## 2023-03-28 DIAGNOSIS — M79676 Pain in unspecified toe(s): Secondary | ICD-10-CM | POA: Diagnosis not present

## 2023-03-28 DIAGNOSIS — B351 Tinea unguium: Secondary | ICD-10-CM

## 2023-03-28 DIAGNOSIS — Q828 Other specified congenital malformations of skin: Secondary | ICD-10-CM | POA: Diagnosis not present

## 2023-03-28 NOTE — Progress Notes (Signed)
This patient returns to my office for at risk foot care.  This patient requires this care by a professional since this patient will be at risk due to having chronic kidney disease, thrombocytopenia and type 2 diabetes.   Patient is taking plavix.    This patient is unable to cut nails himself since the patient cannot reach his nails.These nails are painful walking and wearing shoes.   This patient presents for at risk foot care today.   General Appearance  Alert, conversant and in no acute stress.  Vascular  Dorsalis pedis and posterior tibial  pulses are weakly  palpable  bilaterally.  Capillary return is within normal limits  bilaterally. Temperature is within normal limits  Bilaterally.  Venous stasis  B/L.  Neurologic  Senn-Weinstein monofilament wire test absent   bilaterally. Muscle power within normal limits bilaterally.  Nails Thick disfigured discolored nails with subungual debris  from hallux to fifth toes bilaterally. No evidence of bacterial infection or drainage bilaterally. Hyperkeratotic tissue noted bilateral plantar fifth metatarsals.   Orthopedic  No limitations of motion  feet .  No crepitus or effusions noted.  HAV  B/L.  Hammer toes  B/L.  Exostosis IPJ left hallux.  Skin  normotropic skin with no porokeratosis noted bilaterally.  No signs of infections or ulcers noted.   Porokeratosis sub 5th  Left foot.  Onychomycosis  Pain in right toes  Pain in left toes  Consent was obtained for treatment procedures.   Mechanical debridement of nails 1-5  bilaterally performed with a nail nipper.  Filed with dremel without incident. Hyperkeratotic tissue debrided without incident x2 bilateral.   Return office visit   10 weeks                  Told patient to return for periodic foot care and evaluation due to potential at risk complications.   Elya Tarquinio DPM  

## 2023-05-29 ENCOUNTER — Telehealth: Payer: Self-pay

## 2023-05-29 NOTE — Telephone Encounter (Signed)
   Name: David [name]  DOB: 13-Jun-1931  MRN: [id number]  Primary Cardiologist: Christell Constant, MD   Preoperative team, please contact this patient and set up a phone call appointment for further preoperative risk assessment. Please obtain consent and complete medication review. Thank you for your help.  I confirm that guidance regarding antiplatelet and oral anticoagulation therapy has been completed and, if necessary, noted below.  Per office protocol, he may hold Plavix for 5-7 days prior to procedure and should resume as soon as hemodynamically stable postoperatively.    Carlos Levering, NP 05/29/2023, 5:00 PM [facility]

## 2023-05-29 NOTE — Telephone Encounter (Signed)
   Pre-operative Risk Assessment    Patient Name: David [name]  DOB: 1931-08-09 MRN: [id number]      Request for Surgical Clearance    Procedure:   LEFT INGUINAL HERNIA  Date of Surgery:  Clearance TBD                                 Surgeon:  DR, ERIC WILSON  Surgeon's Group or Practice Name:  [facility] Phone number:  [phone] Fax number:  [phone] ATTN: Vernona Rieger ADKINS   Type of Clearance Requested:   - Pharmacy:  Hold Clopidogrel (Plavix) NEED INSTRUCTION WHEN PATIENT SHOULD HOLD PRIOR TO SURGERY   Type of Anesthesia:  General    Additional requests/questions:    SignedMichaelle Copas   05/29/2023, 11:42 AM

## 2023-05-30 ENCOUNTER — Telehealth: Payer: Self-pay

## 2023-05-30 NOTE — Telephone Encounter (Signed)
Pt was scheduled for tele preop clearance on 06/18 at 10am. Med rec and consent done.

## 2023-05-30 NOTE — Telephone Encounter (Signed)
Pt was scheduled for tele preop clearance on 06/18 at 10am. Med rec and consent done.  

## 2023-06-06 ENCOUNTER — Encounter: Payer: Self-pay | Admitting: Podiatry

## 2023-06-06 ENCOUNTER — Ambulatory Visit: Payer: Medicare Other | Admitting: Podiatry

## 2023-06-06 DIAGNOSIS — D689 Coagulation defect, unspecified: Secondary | ICD-10-CM | POA: Diagnosis not present

## 2023-06-06 DIAGNOSIS — B351 Tinea unguium: Secondary | ICD-10-CM

## 2023-06-06 DIAGNOSIS — E119 Type 2 diabetes mellitus without complications: Secondary | ICD-10-CM | POA: Diagnosis not present

## 2023-06-06 DIAGNOSIS — Q828 Other specified congenital malformations of skin: Secondary | ICD-10-CM | POA: Diagnosis not present

## 2023-06-06 DIAGNOSIS — M79676 Pain in unspecified toe(s): Secondary | ICD-10-CM | POA: Diagnosis not present

## 2023-06-06 DIAGNOSIS — M216X2 Other acquired deformities of left foot: Secondary | ICD-10-CM

## 2023-06-06 DIAGNOSIS — M216X1 Other acquired deformities of right foot: Secondary | ICD-10-CM

## 2023-06-06 DIAGNOSIS — E1142 Type 2 diabetes mellitus with diabetic polyneuropathy: Secondary | ICD-10-CM

## 2023-06-06 NOTE — Progress Notes (Signed)
This patient returns to my office for at risk foot care.  This patient requires this care by a professional since this patient will be at risk due to having chronic kidney disease, thrombocytopenia and type 2 diabetes.   Patient is taking plavix.    This patient is unable to cut nails himself since the patient cannot reach his nails.These nails are painful walking and wearing shoes.  Relates painful callus areas as well.  This patient presents for at risk foot care today.   General Appearance  Alert, conversant and in no acute stress.  Vascular  Dorsalis pedis and posterior tibial  pulses are weakly  palpable  bilaterally.  Capillary return is within normal limits  bilaterally. Temperature is within normal limits  Bilaterally.  Venous stasis  B/L.  Neurologic  Senn-Weinstein monofilament wire test absent   bilaterally. Muscle power within normal limits bilaterally.  Nails Thick disfigured discolored nails with subungual debris  from hallux to fifth toes bilaterally. No evidence of bacterial infection or drainage bilaterally. Hyperkeratotic tissue noted bilateral plantar fifth metatarsals.   Orthopedic  No limitations of motion  feet .  No crepitus or effusions noted.  HAV  B/L.  Hammer toes  B/L.  Exostosis IPJ left hallux.  Skin  normotropic skin with no porokeratosis noted bilaterally.  No signs of infections or ulcers noted.   Porokeratosis sub 5th  Left foot.  Onychomycosis  Pain in right toes  Pain in left toes  Consent was obtained for treatment procedures.   Mechanical debridement of nails 1-5  bilaterally performed with a nail nipper.  Filed with dremel without incident. Hyperkeratotic tissue debrided without incident x2 bilateral.   Will get scheduled for DM shoe fitting as well as due for new shoes.   Return office visit   10 weeks       Told patient to return for periodic foot care and evaluation due to potential at risk complications.   Louann Sjogren DPM

## 2023-06-11 ENCOUNTER — Ambulatory Visit: Payer: Medicare Other | Attending: Cardiovascular Disease | Admitting: Nurse Practitioner

## 2023-06-11 DIAGNOSIS — Z01810 Encounter for preprocedural cardiovascular examination: Secondary | ICD-10-CM

## 2023-06-11 NOTE — Progress Notes (Signed)
Virtual Visit via Telephone Note   Because of [name]'s co-morbid illnesses, he is at least at moderate risk for complications without adequate follow up.  This format is felt to be most appropriate for this patient at this time.  The patient did not have access to video technology/had technical difficulties with video requiring transitioning to audio format only (telephone).  All issues noted in this document were discussed and addressed.  No physical exam could be performed with this format.  Please refer to the patient's chart for his consent to telehealth for [name].  Evaluation Performed:  Preoperative cardiovascular risk assessment _____________   Date:  06/11/2023   Patient ID:  David [name], DOB 10-25-1931, MRN [id number] Patient Location:  Home Provider location:   Office  Primary Care Provider:  Rodrigo Ran, MD Primary Cardiologist:  Christell Constant, MD  Chief Complaint / Patient Profile   89 y.o. y/o male with a h/o thoracic aortic aneurysm, AAA s/p repair, aortic atherosclerosis, mild aortic stenosis, PACs, PVCs, hypertension, hyperlipidemia, CVA, CKD stage III, type 2 diabetes, and COPD who is pending left inguinal hernia repair with Dr. Gaynelle Adu of [name] and presents today for telephonic preoperative cardiovascular risk assessment.  History of Present Illness    David [name] is a 89 y.o. male who presents via audio/video conferencing for a telehealth visit today.  Pt was last seen in cardiology clinic on 07/27/2022 by Dr. Izora Ribas.  At that time HORTON RHODES was doing well.  The patient is now pending procedure as outlined above. Since his last visit, he has done well from a cardiac standpoint.   He denies chest pain, palpitations, dyspnea, pnd, orthopnea, n, v, dizziness, syncope, edema, weight gain, or early satiety. All other systems reviewed and are otherwise negative except as noted above.    Past Medical History    Past Medical History:  Diagnosis Date   AAA (abdominal aortic aneurysm) (HCC)    AAA (abdominal aortic aneurysm) (HCC)    AAA (abdominal aortic aneurysm) (HCC)    Atherosclerosis    pt denies this   BPH (benign prostatic hyperplasia)    CKD (chronic kidney disease), stage III (HCC)    family reports caused by scans but that has resolved since CT scans have stopped   Colon cancer (HCC)    COPD (chronic obstructive pulmonary disease) (HCC)    Diabetes mellitus    ED (erectile dysfunction)    Gastroesophageal reflux disease    Heart murmur    Hyperlipidemia    Hypertension    Lung nodules    Neuromuscular disorder (HCC)    Neuropathy feet   Osteopenia    Peripheral vascular disease (HCC)    Pneumonia June 2013   Positional vertigo    intermittent    Shortness of breath    chronic per patient   Stroke [name])    x2 in 2006   Stroke [name])    Thyroid cancer (HCC)    Thyroid disease    Type 2 diabetes mellitus (HCC)    Vitamin D deficiency    Past Surgical History:  Procedure Laterality Date   ABDOMINAL AORTIC ANEURYSM REPAIR N/A 11/08/2014   Procedure: ANEURYSM ABDOMINAL AORTIC REPAIR;  Surgeon: Larina Earthly, MD;  Location: [facility] OR;  Service: Vascular;  Laterality: N/A;   ABDOMINAL AORTIC ANEURYSM REPAIR     COLON RESECTION  2007   COLON SURGERY  2006   Resection   CYSTOSCOPY  W/ RETROGRADES Bilateral 12/30/2020   Procedure: CYSTOSCOPY WITH BILATERAL RETROGRADE;  Surgeon: Bjorn Pippin, MD;  Location: [facility] ORS;  Service: Urology;  Laterality: Bilateral;   CYSTOSCOPY WITH BIOPSY N/A 12/30/2020   Procedure: CYSTOSCOPY WITH BIOPSY AND FULGURATION;  Surgeon: Bjorn Pippin, MD;  Location: [facility] ORS;  Service: Urology;  Laterality: N/A;   SEPTOPLASTY     THYROIDECTOMY  2007   TRANSURETHRAL RESECTION OF BLADDER TUMOR WITH MITOMYCIN-C N/A 02/12/2017   Procedure: TRANSURETHRAL RESECTION OF BLADDER TUMOR;  Surgeon: Bjorn Pippin, MD;  Location: [facility] ORS;  Service: Urology;   Laterality: N/A;    Allergies  Allergies  Allergen Reactions   Azithromycin Rash    zpack   Doxycycline Rash    Home Medications    Prior to Admission medications   Medication Sig Start Date End Date Taking? Authorizing Provider  acetaminophen (TYLENOL) 500 MG tablet Take 500-1,000 mg by mouth See admin instructions. Take 500 mg daily at bedtime and may take an additional 500mg  tablet if needed for pain    [provider]  allopurinol (ZYLOPRIM) 100 MG tablet Take 150 mg by mouth daily.    [provider]  BD PEN NEEDLE NANO 2ND GEN 32G X 4 MM MISC USE WITH INSULIN PENS TO GIVE 4 INJECTIONS DAILY 02/27/22   [provider]  bifidobacterium infantis (ALIGN) capsule daily.    [provider]  clopidogrel (PLAVIX) 75 MG tablet Take 75 mg by mouth at bedtime.     [provider]  COMBIVENT RESPIMAT 20-100 MCG/ACT AERS respimat Inhale 1 puff into the lungs in the morning and at bedtime. 01/06/20   [provider]  diltiazem (CARDIZEM CD) 120 MG 24 hr capsule Take 1 capsule (120 mg total) by mouth daily. 07/27/22   Christell Constant, MD  famotidine (PEPCID) 20 MG tablet Take 1 tablet (20 mg total) by mouth at bedtime. 12/29/21   Martina Sinner, MD  fexofenadine (ALLEGRA) 180 MG tablet Take 180 mg by mouth at bedtime. Patient not taking: Reported on 05/30/2023 09/18/17   [provider]  Fluticasone-Salmeterol (ADVAIR) 250-50 MCG/DOSE AEPB Inhale 1 puff into the lungs every 12 (twelve) hours. Patient not taking: Reported on 05/30/2023    [provider]  HUMALOG KWIKPEN 100 UNIT/ML KwikPen Inject 3 Units into the skin See admin instructions. 4 units in the morning and 4 units late afternoon/supper time 02/29/20   [provider]  ipratropium-albuterol (DUONEB) 0.5-2.5 (3) MG/3ML SOLN Take 3 mLs by nebulization 2 (two) times daily as needed for wheezing or shortness of breath. 05/31/21   [provider]   ketoconazole (NIZORAL) 2 % cream Apply to bottom of feet twice daily 10/18/22   Delories Heinz, DPM  LEVEMIR FLEXTOUCH 100 UNIT/ML Pen Inject 5 Units into the skin at bedtime. 04/05/15   [provider]  levothyroxine (SYNTHROID) 100 MCG tablet daily.    [provider]  LORazepam (ATIVAN) 0.5 MG tablet Take 0.25-0.5 mg by mouth daily as needed for anxiety. 02/10/21   [provider]  mirtazapine (REMERON SOL-TAB) 15 MG disintegrating tablet Take 15 mg by mouth at bedtime. 12/28/16   [provider]  nitroGLYCERIN (NITROSTAT) 0.4 MG SL tablet Place 1 tablet (0.4 mg total) under the tongue every 5 (five) minutes as needed for chest pain. 12/07/21 03/07/22  Christell Constant, MD  ONETOUCH VERIO test strip SMARTSIG:Via Meter 4 Times Daily PRN 05/23/22   [provider]  rosuvastatin (CRESTOR) 10 MG tablet Take  10 mg by mouth at bedtime. 10/18/19   [provider]  valsartan-hydrochlorothiazide (DIOVAN-HCT) 80-12.5 MG tablet Take 1 tablet by mouth daily. 09/16/19   [provider]  Vitamin D, Ergocalciferol, (DRISDOL) 50000 UNITS CAPS Take 50,000 Units by mouth every Monday.     [provider]    Physical Exam    Vital Signs:  YERAY BOLDON does not have vital signs available for review today.  Given telephonic nature of communication, physical exam is limited. AAOx3. NAD. Normal affect.  Speech and respirations are unlabored.  Accessory Clinical Findings    None  Assessment & Plan    1.  Preoperative Cardiovascular Risk Assessment:  According to the Revised Cardiac Risk Index (RCRI), his Perioperative Risk of Major Cardiac Event is (%): 6.6. His Functional Capacity in METs is: 5.62 according to the Duke Activity Status Index (DASI).Therefore, based on ACC/AHA guidelines, patient would be at acceptable risk for the planned procedure without further cardiovascular testing.   The patient was advised that if he  develops new symptoms prior to surgery to contact our office to arrange for a follow-up visit, and he verbalized understanding.  From a cardiology standpoint l, he may hold Plavix for 5 days prior to procedure.  However, patient takes Plavix primarily for a history of CVA.  Therefore, final recommendations for holding Plavix prior to surgery should come from managing provider (PCP/neurology).    A copy of this note will be routed to requesting surgeon.  Time:   Today, I have spent 5 minutes with the patient with telehealth technology discussing medical history, symptoms, and management plan.     Joylene Grapes, NP  06/11/2023, 10:09 AM

## 2023-07-06 ENCOUNTER — Other Ambulatory Visit: Payer: Self-pay | Admitting: Internal Medicine

## 2023-07-11 NOTE — Progress Notes (Signed)
Please send preop orders for PST appointment 7//22/24.

## 2023-07-11 NOTE — Progress Notes (Signed)
COVID Vaccine received:  []  No [x]  Yes Date of any COVID positive Test in last 34 days:No  PCP - Rodrigo Ran MD Cardiologist - Riley Lam MD  Cardiac clearance 06/11/23 Bernadene Person NP  Chest x-ray -  EKG -  07/27/22 EPIC Stress Test -  ECHO - 03/04/23 EPIC Cardiac Cath -   Bowel Prep - [x]  No  []   Yes ______  Pacemaker / ICD device [x]  No []  Yes   Spinal Cord Stimulator:[x]  No []  Yes       History of Sleep Apnea? [x]  No []  Yes   CPAP used?- [x]  No []  Yes    Does the patient monitor blood sugar?          []  No [x]  Yes  []  N/A  Patient has: []  NO Hx DM   []  Pre-DM                 []  DM1  [x]   DM2 Does patient have a [employer] or Dexacom? []  No []  Yes   Fasting Blood Sugar Ranges- 130-140 Checks Blood Sugar ___1__ times a day  GLP1 agonist / usual dose - No GLP1 instructions:  SGLT-2 inhibitors / usual dose - No SGLT-2 instructions:   Blood Thinner / Instructions:Plavix. Hold For 5 days prior to surgery. Aspirin Instructions:no  Comments:   Activity level: Patient is ableto climb a flight of stairs without difficulty; [x]  No CP  []  No SOB, but would have ___   Patient can  perform ADLs without assistance.   Anesthesia review: DM2,HTN,COPD,AAA,CKD3, Stroke, Aortic atherosclerosis, On Plavix  Patient denies shortness of breath, fever, cough and chest pain at PAT appointment.  Patient verbalized understanding and agreement to the Pre-Surgical Instructions that were given to them at this PAT appointment. Patient was also educated of the need to review these PAT instructions again prior to his/her surgery.I reviewed the appropriate phone numbers to call if they have any and questions or concerns.

## 2023-07-15 ENCOUNTER — Encounter (HOSPITAL_COMMUNITY)
Admission: RE | Admit: 2023-07-15 | Discharge: 2023-07-15 | Disposition: A | Discharge status: Home / Self Care | Payer: Medicare Other | Source: Ambulatory Visit | Attending: General Surgery | Admitting: General Surgery

## 2023-07-15 ENCOUNTER — Encounter (HOSPITAL_COMMUNITY): Payer: Self-pay

## 2023-07-15 ENCOUNTER — Other Ambulatory Visit: Payer: Self-pay

## 2023-07-15 VITALS — BP 150/58 | HR 64 | Temp 97.7°F | Resp 20 | Ht 72.0 in | Wt 166.0 lb

## 2023-07-15 DIAGNOSIS — I10 Essential (primary) hypertension: Secondary | ICD-10-CM

## 2023-07-15 DIAGNOSIS — I639 Cerebral infarction, unspecified: Secondary | ICD-10-CM | POA: Diagnosis not present

## 2023-07-15 DIAGNOSIS — I129 Hypertensive chronic kidney disease with stage 1 through stage 4 chronic kidney disease, or unspecified chronic kidney disease: Secondary | ICD-10-CM | POA: Insufficient documentation

## 2023-07-15 DIAGNOSIS — E1122 Type 2 diabetes mellitus with diabetic chronic kidney disease: Secondary | ICD-10-CM | POA: Diagnosis not present

## 2023-07-15 DIAGNOSIS — Z87891 Personal history of nicotine dependence: Secondary | ICD-10-CM | POA: Insufficient documentation

## 2023-07-15 DIAGNOSIS — Z794 Long term (current) use of insulin: Secondary | ICD-10-CM | POA: Diagnosis not present

## 2023-07-15 DIAGNOSIS — K4090 Unilateral inguinal hernia, without obstruction or gangrene, not specified as recurrent: Secondary | ICD-10-CM | POA: Insufficient documentation

## 2023-07-15 DIAGNOSIS — Z01812 Encounter for preprocedural laboratory examination: Secondary | ICD-10-CM | POA: Insufficient documentation

## 2023-07-15 DIAGNOSIS — J449 Chronic obstructive pulmonary disease, unspecified: Secondary | ICD-10-CM | POA: Insufficient documentation

## 2023-07-15 DIAGNOSIS — E119 Type 2 diabetes mellitus without complications: Secondary | ICD-10-CM | POA: Diagnosis not present

## 2023-07-15 DIAGNOSIS — Z8679 Personal history of other diseases of the circulatory system: Secondary | ICD-10-CM | POA: Diagnosis not present

## 2023-07-15 DIAGNOSIS — N183 Chronic kidney disease, stage 3 unspecified: Secondary | ICD-10-CM | POA: Insufficient documentation

## 2023-07-15 HISTORY — DX: Anxiety disorder, unspecified: F41.9

## 2023-07-15 LAB — CBC
HCT: 37 % — ABNORMAL LOW (ref 39.0–52.0)
Hemoglobin: 12.1 g/dL — ABNORMAL LOW (ref 13.0–17.0)
MCH: 33.4 pg (ref 26.0–34.0)
MCHC: 32.7 g/dL (ref 30.0–36.0)
MCV: 102.2 fL — ABNORMAL HIGH (ref 80.0–100.0)
Platelets: 144 10*3/uL — ABNORMAL LOW (ref 150–400)
RBC: 3.62 MIL/uL — ABNORMAL LOW (ref 4.22–5.81)
RDW: 14.8 % (ref 11.5–15.5)
WBC: 6.7 10*3/uL (ref 4.0–10.5)
nRBC: 0 % (ref 0.0–0.2)

## 2023-07-15 LAB — HEMOGLOBIN A1C
Hgb A1c MFr Bld: 6.6 % — ABNORMAL HIGH (ref 4.8–5.6)
Mean Plasma Glucose: 142.72 mg/dL

## 2023-07-15 LAB — BASIC METABOLIC PANEL
Anion gap: 6 (ref 5–15)
BUN: 39 mg/dL — ABNORMAL HIGH (ref 8–23)
CO2: 22 mmol/L (ref 22–32)
Calcium: 9.1 mg/dL (ref 8.9–10.3)
Chloride: 111 mmol/L (ref 98–111)
Creatinine, Ser: 1.71 mg/dL — ABNORMAL HIGH (ref 0.61–1.24)
GFR, Estimated: 38 mL/min — ABNORMAL LOW (ref >60)
Glucose, Bld: 88 mg/dL (ref 70–99)
Potassium: 4.5 mmol/L (ref 3.5–5.1)
Sodium: 139 mmol/L (ref 135–145)

## 2023-07-15 NOTE — Patient Instructions (Addendum)
SURGICAL WAITING ROOM VISITATION  Patients having surgery or a procedure may have no more than 2 support people in the waiting area - these visitors may rotate.    Children under the age of 38 must have an adult with them who is not the patient.  Due to an increase in RSV and influenza rates and associated hospitalizations, children ages 81 and under may not visit patients in [facility] hospitals.  If the patient needs to stay at the hospital during part of their recovery, the visitor guidelines for inpatient rooms apply. Pre-op nurse will coordinate an appropriate time for 1 support person to accompany patient in pre-op.  This support person may not rotate.    Please refer to the [facility] website for the visitor guidelines for Inpatients (after your surgery is over and you are in a regular room).       Your procedure is scheduled on: 07/26/23   Report to [facility] Main Entrance    Report to admitting at 7:15 AM   Call this number if you have problems the morning of surgery [phone]   Do not eat food or drink liquids :After Midnight.   FOLLOW BOWEL PREP AND ANY ADDITIONAL PRE OP INSTRUCTIONS YOU RECEIVED FROM YOUR SURGEON'S OFFICE!!!     Oral Hygiene is also important to reduce your risk of infection.                                    Remember - BRUSH YOUR TEETH THE MORNING OF SURGERY WITH YOUR REGULAR TOOTHPASTE   Take these medicines the morning of surgery: Tylenol as needed, Allopurinol, Cardizem, Famotidine, Synthroid and Lorazepam, inhalers as needed.   DO NOT TAKE ANY ORAL DIABETIC MEDICATIONS DAY OF YOUR SURGERY             You may not have any metal on your body including hair pins, jewelry, and body piercing             Do not wear make-up, lotions, powders, cologne, or deodorant              Men may shave face and neck.   Do not bring valuables to the hospital. [name] IS NOT             RESPONSIBLE   FOR VALUABLES.   Contacts, glasses,  dentures or bridgework may not be worn into surgery.   Bring small overnight bag day of surgery.   DO NOT BRING YOUR HOME MEDICATIONS TO THE HOSPITAL. PHARMACY WILL DISPENSE MEDICATIONS LISTED ON YOUR MEDICATION LIST TO YOU DURING YOUR ADMISSION IN THE HOSPITAL!    Patients discharged on the day of surgery will not be allowed to drive home.  Someone NEEDS to stay with you for the first 24 hours after anesthesia.   Special Instructions: Bring a copy of your healthcare power of attorney and living will documents the day of surgery if you haven't scanned them before.              Please read over the following fact sheets you were given: IF YOU HAVE QUESTIONS ABOUT YOUR PRE-OP INSTRUCTIONS PLEASE CALL [phone] [name]  If you received a COVID test during your pre-op visit  it is requested that you wear a mask when out in public, stay away from anyone that may not be feeling well and notify your surgeon if you develop symptoms. If  you test positive for Covid or have been in contact with anyone that has tested positive in the last 10 days please notify you surgeon.    [name] - Preparing for Surgery Before surgery, you can play an important role.  Because skin is not sterile, your skin needs to be as free of germs as possible.  You can reduce the number of germs on your skin by washing with CHG (chlorahexidine gluconate) soap before surgery.  CHG is an antiseptic cleaner which kills germs and bonds with the skin to continue killing germs even after washing. Please DO NOT use if you have an allergy to CHG or antibacterial soaps.  If your skin becomes reddened/irritated stop using the CHG and inform your nurse when you arrive at Short Stay. Do not shave (including legs and underarms) for at least 48 hours prior to the first CHG shower.  You may shave your face/neck.  Please follow these instructions carefully:  1.  Shower with CHG Soap the night before surgery and the  morning of surgery.  2.   If you choose to wash your hair, wash your hair first as usual with your normal  shampoo.  3.  After you shampoo, rinse your hair and body thoroughly to remove the shampoo.                             4.  Use CHG as you would any other liquid soap.  You can apply chg directly to the skin and wash.  Gently with a scrungie or clean washcloth.  5.  Apply the CHG Soap to your body ONLY FROM THE NECK DOWN.   Do   not use on face/ open                           Wound or open sores. Avoid contact with eyes, ears mouth and   genitals (private parts).                       Wash face,  Genitals (private parts) with your normal soap.             6.  Wash thoroughly, paying special attention to the area where your    surgery  will be performed.  7.  Thoroughly rinse your body with warm water from the neck down.  8.  DO NOT shower/wash with your normal soap after using and rinsing off the CHG Soap.                9.  Pat yourself dry with a clean towel.            10.  Wear clean pajamas.            11.  Place clean sheets on your bed the night of your first shower and do not  sleep with pets. Day of Surgery : Do not apply any lotions/deodorants the morning of surgery.  Please wear clean clothes to the hospital/surgery center.  FAILURE TO FOLLOW THESE INSTRUCTIONS MAY RESULT IN THE CANCELLATION OF YOUR SURGERY  ________________________________________________________________________How to Manage Your Diabetes Before and After Surgery  Why is it important to control my blood sugar before and after surgery? Improving blood sugar levels before and after surgery helps healing and can limit problems. A way of improving blood sugar control is eating a healthy diet by:  Eating less sugar and carbohydrates  Increasing activity/exercise  Talking with your doctor about reaching your blood sugar goals High blood sugars (greater than 180 mg/dL) can raise your risk of infections and slow your recovery, so you will  need to focus on controlling your diabetes during the weeks before surgery. Make sure that the doctor who takes care of your diabetes knows about your planned surgery including the date and location.  How do I manage my blood sugar before surgery? Check your blood sugar at least 4 times a day, starting 2 days before surgery, to make sure that the level is not too high or low. Check your blood sugar the morning of your surgery when you wake up and every 2 hours until you get to the Short Stay unit. If your blood sugar is less than 70 mg/dL, you will need to treat for low blood sugar: Do not take insulin. Treat a low blood sugar (less than 70 mg/dL) with  cup of clear juice (cranberry or apple), 4 glucose tablets, OR glucose gel. Recheck blood sugar in 15 minutes after treatment (to make sure it is greater than 70 mg/dL). If your blood sugar is not greater than 70 mg/dL on recheck, call [phone] for further instructions. Report your blood sugar to the short stay nurse when you get to Short Stay.  If you are admitted to the hospital after surgery: Your blood sugar will be checked by the staff and you will probably be given insulin after surgery (instead of oral diabetes medicines) to make sure you have good blood sugar levels. The goal for blood sugar control after surgery is 80-180 mg/dL.   WHAT DO I DO ABOUT MY DIABETES MEDICATION?  Do not take oral diabetes medicines (pills) the morning of surgery.  THE NIGHT BEFORE SURGERY take half dose of Levemir in evening       THE MORNING OF SURGERY if CBG > 220 take half of Humalog  DO NOT TAKE THE FOLLOWING 7 DAYS PRIOR TO SURGERY: Ozempic, Wegovy, Rybelsus (Semaglutide), Byetta (exenatide), Bydureon (exenatide ER), Victoza, Saxenda (liraglutide), or Trulicity (dulaglutide) Mounjaro (Tirzepatide) Adlyxin (Lixisenatide), Polyethylene Glycol Loxenatide.  If your CBG is greater than 220 mg/dL, you may take  of your sliding scale  (correction)  dose of insulin.    For patients with insulin pumps: Contact your diabetes doctor for specific instructions before surgery. Decrease basal rates by 20% at midnight the night before your surgery. Note that if your surgery is planned to be longer than 2 hours, your insulin pump will be removed and intravenous (IV) insulin will be started and managed by the nurses and the anesthesiologist. You will be able to restart your insulin pump once you are awake and able to manage it.  Make sure to bring insulin pump supplies to the hospital with you in case the  site needs to be changed.  Patient Signature:  Date:   Nurse Signature:  Date:   Reviewed and Endorsed by [facility] Patient Education Committee, August 2015

## 2023-07-15 NOTE — Progress Notes (Signed)
Please review pt's preop BMP results drawn 07/15/23

## 2023-07-18 NOTE — Anesthesia Preprocedure Evaluation (Addendum)
Anesthesia Evaluation  Patient identified by MRN, date of birth, ID band Patient awake    Reviewed: Allergy & Precautions, NPO status , Patient's Chart, lab work & pertinent test results  History of Anesthesia Complications Negative for: history of anesthetic complications  Airway Mallampati: II  TM Distance: >3 FB Neck ROM: Full   Comment: Previous grade I view with Miller 2, easy mask Dental  (+) Dental Advisory Given   Pulmonary neg shortness of breath, neg sleep apnea, COPD (no recent flares, used inhalers this morning),  COPD inhaler, neg recent URI, former smoker nodules   Pulmonary exam normal breath sounds clear to auscultation       Cardiovascular hypertension (valsartan-HCTZ), Pt. on medications (-) angina + Peripheral Vascular Disease and +CHF  (-) Past MI, (-) Cardiac Stents and (-) CABG + dysrhythmias (PACs, PVCs) + Valvular Problems/Murmurs (mild) AS  Rhythm:Regular Rate:Bradycardia  AAA s/p repair, thoracic aortic aneurysm, HLD  TTE 03/04/2023: IMPRESSIONS    1. Left ventricular ejection fraction, by estimation, is 55 to 60%. The  left ventricle has normal function. The left ventricle has no regional  wall motion abnormalities. There is mild left ventricular hypertrophy.  Left ventricular diastolic parameters  are consistent with Grade I diastolic dysfunction (impaired relaxation).  The average left ventricular global longitudinal strain is -20.1 %. The  global longitudinal strain is normal.   2. Right ventricular systolic function is normal. The right ventricular  size is normal. There is normal pulmonary artery systolic pressure.   3. Right atrial size was mildly dilated.   4. The mitral valve is normal in structure. Mild mitral valve  regurgitation. No evidence of mitral stenosis.   5. The aortic valve is tricuspid. There is moderate calcification of the  aortic valve. There is moderate thickening of the  aortic valve. Aortic  valve regurgitation is trivial. Mild aortic valve stenosis. Aortic valve  area, by VTI measures 1.12 cm.  Aortic valve mean gradient measures 10.0 mmHg. Aortic valve Vmax measures  2.14 m/s.   6. There is mild dilatation of the aortic root, measuring 36 mm.   7. The inferior vena cava is normal in size with greater than 50%  respiratory variability, suggesting right atrial pressure of 3 mmHg.   Stress Test 12/26/2021:   No ST deviation was noted.   Left ventricular function is normal. Nuclear stress EF: 58 %. The left ventricular ejection fraction is normal (55-65%). End diastolic cavity size is normal.   There is very mild attenuation of the inferior apical wall on the upright stress images.   This same defect was seen on the previous study in 2015.   Prior study available for comparison from 10/08/2014 which showed a fixed inferior apical defect at stress and rest.   The upright stress images on this study  show only minimal attenuation of the inferioapical region.  The LV contractility is normal .   The study is normal. The study is low risk.     Neuro/Psych neg Seizures  Anxiety     vertigo  Neuromuscular disease (neuropathy) CVA (x2, 2006, residual left-sided weakness), Residual Symptoms    GI/Hepatic Neg liver ROS,GERD  Medicated,,H/o colon cancer   Endo/Other  diabetes (Hgb A1c 6.6), Well Controlled, Type 2, Insulin DependentHypothyroidism  Thyroid cancer s/p thyroidectomy  Renal/GU CRFRenal disease     Musculoskeletal osteopenia   Abdominal   Peds  Hematology negative hematology ROS (+) Lab Results      Component  Value               Date                      WBC                      6.7                 07/15/2023                HGB                      12.1 (L)            07/15/2023                HCT                      37.0 (L)            07/15/2023                MCV                      102.2 (H)           07/15/2023                 PLT                      144 (L)             07/15/2023              Anesthesia Other Findings Last Plavix: 07/19/2023  Reproductive/Obstetrics                             Anesthesia Physical Anesthesia Plan  ASA: 3  Anesthesia Plan: General   Post-op Pain Management: Tylenol PO (pre-op)*   Induction: Intravenous  PONV Risk Score and Plan: 2 and Ondansetron, Dexamethasone and Treatment may vary due to age or medical condition  Airway Management Planned: Oral ETT  Additional Equipment:   Intra-op Plan:   Post-operative Plan: Extubation in OR  Informed Consent: I have reviewed the patients History and Physical, chart, labs and discussed the procedure including the risks, benefits and alternatives for the proposed anesthesia with the patient or authorized representative who has indicated his/her understanding and acceptance.   Patient has DNR.  Discussed DNR with patient and Continue DNR.   Dental advisory given  Plan Discussed with: CRNA and Anesthesiologist  Anesthesia Plan Comments: (See PAT note 07/15/23  Risks of general anesthesia discussed including, but not limited to, sore throat, hoarse voice, chipped/damaged teeth, injury to vocal cords, nausea and vomiting, allergic reactions, lung infection, heart attack, stroke, and death. All questions answered.  Discussed DNR with patient and his daughter. OK to intubate for the procedure. OK to give cardiac medications. Does not want chest compressions or shocks. )       Anesthesia Quick Evaluation

## 2023-07-18 NOTE — Progress Notes (Signed)
Anesthesia Chart Review   Case: [id number] Date/Time: 07/26/23 0915   Procedure: OPEN LEFT INGUINAL HERNIA REPAIR WITH MESH (Left)   Anesthesia type: General   Pre-op diagnosis: LEFT INGUINAL HERNIA   [facility] ROOM 04 / [facility]   Surgeons: Gaynelle Adu, MD       DISCUSSION:89 y.o. former smoker with h/o HTN, COPD, DM II, CKD Stage III, AAA s/p repair, mild AS, CVA, left inguinal hernia scheduled for above procedure 07/26/23 with Dr. Gaynelle Adu.   Pt last seen by cardiology 06/11/2023. Per OV note, "According to the Revised Cardiac Risk Index (RCRI), his Perioperative Risk of Major Cardiac Event is (%): 6.6. His Functional Capacity in METs is: 5.62 according to the Duke Activity Status Index (DASI).Therefore, based on ACC/AHA guidelines, patient would be at acceptable risk for the planned procedure without further cardiovascular testing.    The patient was advised that if he develops new symptoms prior to surgery to contact our office to arrange for a follow-up visit, and he verbalized understanding.   From a cardiology standpoint l, he may hold Plavix for 5 days prior to procedure.  However, patient takes Plavix primarily for a history of CVA.  Therefore, final recommendations for holding Plavix prior to surgery should come from managing provider (PCP/neurology)."  Pt advised to hold Plavix 5 days prior to procedure.  VS: BP (!) 150/58   Pulse 64   Temp 36.5 C (Oral)   Resp 20   Ht 6' (1.829 m)   Wt 75.3 kg   SpO2 98%   BMI 22.51 kg/m   PROVIDERS: Rodrigo Ran, MD is PCP   Cardiologist - Riley Lam MD  LABS: Labs reviewed: Acceptable for surgery. (all labs ordered are listed, but only abnormal results are displayed)  Labs Reviewed  HEMOGLOBIN A1C - Abnormal; Notable for the following components:      Result Value   Hgb A1c MFr Bld 6.6 (*)    All other components within normal limits  BASIC METABOLIC PANEL - Abnormal; Notable for the following components:   BUN  39 (*)    Creatinine, Ser 1.71 (*)    GFR, Estimated 38 (*)    All other components within normal limits  CBC - Abnormal; Notable for the following components:   RBC 3.62 (*)    Hemoglobin 12.1 (*)    HCT 37.0 (*)    MCV 102.2 (*)    Platelets 144 (*)    All other components within normal limits     IMAGES:   EKG:   CV: Echo 03/04/2023 1. Left ventricular ejection fraction, by estimation, is 55 to 60%. The  left ventricle has normal function. The left ventricle has no regional  wall motion abnormalities. There is mild left ventricular hypertrophy.  Left ventricular diastolic parameters  are consistent with Grade I diastolic dysfunction (impaired relaxation).  The average left ventricular global longitudinal strain is -20.1 %. The  global longitudinal strain is normal.   2. Right ventricular systolic function is normal. The right ventricular  size is normal. There is normal pulmonary artery systolic pressure.   3. Right atrial size was mildly dilated.   4. The mitral valve is normal in structure. Mild mitral valve  regurgitation. No evidence of mitral stenosis.   5. The aortic valve is tricuspid. There is moderate calcification of the  aortic valve. There is moderate thickening of the aortic valve. Aortic  valve regurgitation is trivial. Mild aortic valve stenosis. Aortic valve  area, by  VTI measures 1.12 cm.  Aortic valve mean gradient measures 10.0 mmHg. Aortic valve Vmax measures  2.14 m/s.   6. There is mild dilatation of the aortic root, measuring 36 mm.   7. The inferior vena cava is normal in size with greater than 50%  respiratory variability, suggesting right atrial pressure of 3 mmHg.   Myocardial Perfusion 12/28/2021    No ST deviation was noted.   Left ventricular function is normal. Nuclear stress EF: 58 %. The left ventricular ejection fraction is normal (55-65%). End diastolic cavity size is normal.   There is very mild attenuation of the inferior apical  wall on the upright stress images.   This same defect was seen on the previous study in 2015.   Prior study available for comparison from 10/08/2014 which showed a fixed inferior apical defect at stress and rest.   The upright stress images on this study  show only minimal attenuation of the inferioapical region.  The LV contractility is normal .   The study is normal. The study is low risk. Past Medical History:  Diagnosis Date   AAA (abdominal aortic aneurysm) (HCC)    AAA (abdominal aortic aneurysm) (HCC)    AAA (abdominal aortic aneurysm) (HCC)    Anxiety    Atherosclerosis    pt denies this   BPH (benign prostatic hyperplasia)    CKD (chronic kidney disease), stage III (HCC)    family reports caused by scans but that has resolved since CT scans have stopped   Colon cancer (HCC)    COPD (chronic obstructive pulmonary disease) (HCC)    Diabetes mellitus    ED (erectile dysfunction)    Gastroesophageal reflux disease    Heart murmur    Hyperlipidemia    Hypertension    Lung nodules    Neuromuscular disorder (HCC)    Neuropathy feet   Osteopenia    Peripheral vascular disease (HCC)    Pneumonia 05/2012   Positional vertigo    intermittent    Shortness of breath    chronic per patient   Stroke (HCC)    x2 in 2006   Stroke [facility])    Thyroid cancer (HCC)    Thyroid disease    Type 2 diabetes mellitus (HCC)    Vitamin D deficiency     Past Surgical History:  Procedure Laterality Date   ABDOMINAL AORTIC ANEURYSM REPAIR N/A 11/08/2014   Procedure: ANEURYSM ABDOMINAL AORTIC REPAIR;  Surgeon: Larina Earthly, MD;  Location: [facility] OR;  Service: Vascular;  Laterality: N/A;   ABDOMINAL AORTIC ANEURYSM REPAIR     CATARACT EXTRACTION Bilateral    COLON RESECTION  2007   COLON SURGERY  2006   Resection   CYSTOSCOPY W/ RETROGRADES Bilateral 12/30/2020   Procedure: CYSTOSCOPY WITH BILATERAL RETROGRADE;  Surgeon: Bjorn Pippin, MD;  Location: [facility];  Service: Urology;  Laterality:  Bilateral;   CYSTOSCOPY WITH BIOPSY N/A 12/30/2020   Procedure: CYSTOSCOPY WITH BIOPSY AND FULGURATION;  Surgeon: Bjorn Pippin, MD;  Location: [facility];  Service: Urology;  Laterality: N/A;   SEPTOPLASTY     THYROIDECTOMY  2007   TRANSURETHRAL RESECTION OF BLADDER TUMOR WITH MITOMYCIN-C N/A 02/12/2017   Procedure: TRANSURETHRAL RESECTION OF BLADDER TUMOR;  Surgeon: Bjorn Pippin, MD;  Location: [facility];  Service: Urology;  Laterality: N/A;    MEDICATIONS:  acetaminophen (TYLENOL) 500 MG tablet   allopurinol (ZYLOPRIM) 100 MG tablet   BD PEN NEEDLE NANO 2ND GEN 32G X 4 MM MISC  clopidogrel (PLAVIX) 75 MG tablet   COMBIVENT RESPIMAT 20-100 MCG/ACT AERS respimat   diltiazem (CARDIZEM CD) 120 MG 24 hr capsule   famotidine (PEPCID) 20 MG tablet   Fluticasone-Salmeterol (ADVAIR) 250-50 MCG/DOSE AEPB   HUMALOG KWIKPEN 100 UNIT/ML KwikPen   ipratropium-albuterol (DUONEB) 0.5-2.5 (3) MG/3ML SOLN   ketoconazole (NIZORAL) 2 % cream   LEVEMIR FLEXTOUCH 100 UNIT/ML Pen   levothyroxine (SYNTHROID) 100 MCG tablet   lipase/protease/amylase (CREON) 36000 UNITS CPEP capsule   LORazepam (ATIVAN) 0.5 MG tablet   nitroGLYCERIN (NITROSTAT) 0.4 MG SL tablet   ONETOUCH VERIO test strip   rosuvastatin (CRESTOR) 10 MG tablet   valsartan-hydrochlorothiazide (DIOVAN-HCT) 80-12.5 MG tablet   Vitamin D, Ergocalciferol, (DRISDOL) 50000 UNITS CAPS    0.9 %  sodium chloride infusion    Santanna Whitford Ward, PA-C [facility] Pre-Surgical Testing [phone]

## 2023-07-26 ENCOUNTER — Other Ambulatory Visit: Payer: Self-pay

## 2023-07-26 ENCOUNTER — Encounter (HOSPITAL_COMMUNITY)
Admission: RE | Disposition: A | Discharge status: Home / Self Care | Payer: Self-pay | Source: Ambulatory Visit | Attending: General Surgery

## 2023-07-26 ENCOUNTER — Ambulatory Visit (HOSPITAL_COMMUNITY)
Admission: RE | Admit: 2023-07-26 | Discharge: 2023-07-26 | Disposition: A | Discharge status: Home / Self Care | Payer: Medicare Other | Source: Ambulatory Visit | Attending: General Surgery | Admitting: General Surgery

## 2023-07-26 ENCOUNTER — Encounter (HOSPITAL_COMMUNITY): Payer: Self-pay | Admitting: General Surgery

## 2023-07-26 ENCOUNTER — Ambulatory Visit (HOSPITAL_BASED_OUTPATIENT_CLINIC_OR_DEPARTMENT_OTHER): Payer: Medicare Other | Admitting: Anesthesiology

## 2023-07-26 ENCOUNTER — Ambulatory Visit (HOSPITAL_COMMUNITY): Payer: Medicare Other | Admitting: Physician Assistant

## 2023-07-26 DIAGNOSIS — J449 Chronic obstructive pulmonary disease, unspecified: Secondary | ICD-10-CM | POA: Diagnosis not present

## 2023-07-26 DIAGNOSIS — Z9049 Acquired absence of other specified parts of digestive tract: Secondary | ICD-10-CM | POA: Insufficient documentation

## 2023-07-26 DIAGNOSIS — Z87891 Personal history of nicotine dependence: Secondary | ICD-10-CM | POA: Diagnosis not present

## 2023-07-26 DIAGNOSIS — K4090 Unilateral inguinal hernia, without obstruction or gangrene, not specified as recurrent: Secondary | ICD-10-CM

## 2023-07-26 DIAGNOSIS — Z85038 Personal history of other malignant neoplasm of large intestine: Secondary | ICD-10-CM | POA: Diagnosis not present

## 2023-07-26 DIAGNOSIS — E890 Postprocedural hypothyroidism: Secondary | ICD-10-CM | POA: Diagnosis not present

## 2023-07-26 DIAGNOSIS — I350 Nonrheumatic aortic (valve) stenosis: Secondary | ICD-10-CM | POA: Diagnosis not present

## 2023-07-26 DIAGNOSIS — Z85850 Personal history of malignant neoplasm of thyroid: Secondary | ICD-10-CM | POA: Diagnosis not present

## 2023-07-26 DIAGNOSIS — I129 Hypertensive chronic kidney disease with stage 1 through stage 4 chronic kidney disease, or unspecified chronic kidney disease: Secondary | ICD-10-CM | POA: Insufficient documentation

## 2023-07-26 DIAGNOSIS — Z8551 Personal history of malignant neoplasm of bladder: Secondary | ICD-10-CM | POA: Diagnosis not present

## 2023-07-26 DIAGNOSIS — I69354 Hemiplegia and hemiparesis following cerebral infarction affecting left non-dominant side: Secondary | ICD-10-CM | POA: Diagnosis not present

## 2023-07-26 DIAGNOSIS — E1122 Type 2 diabetes mellitus with diabetic chronic kidney disease: Secondary | ICD-10-CM | POA: Diagnosis not present

## 2023-07-26 DIAGNOSIS — Z794 Long term (current) use of insulin: Secondary | ICD-10-CM | POA: Insufficient documentation

## 2023-07-26 DIAGNOSIS — D176 Benign lipomatous neoplasm of spermatic cord: Secondary | ICD-10-CM | POA: Insufficient documentation

## 2023-07-26 DIAGNOSIS — N183 Chronic kidney disease, stage 3 unspecified: Secondary | ICD-10-CM | POA: Insufficient documentation

## 2023-07-26 DIAGNOSIS — I509 Heart failure, unspecified: Secondary | ICD-10-CM | POA: Diagnosis not present

## 2023-07-26 DIAGNOSIS — E1151 Type 2 diabetes mellitus with diabetic peripheral angiopathy without gangrene: Secondary | ICD-10-CM | POA: Diagnosis not present

## 2023-07-26 DIAGNOSIS — I130 Hypertensive heart and chronic kidney disease with heart failure and stage 1 through stage 4 chronic kidney disease, or unspecified chronic kidney disease: Secondary | ICD-10-CM

## 2023-07-26 DIAGNOSIS — E119 Type 2 diabetes mellitus without complications: Secondary | ICD-10-CM

## 2023-07-26 HISTORY — PX: INGUINAL HERNIA REPAIR: SHX194

## 2023-07-26 LAB — GLUCOSE, CAPILLARY
Glucose-Capillary: 119 mg/dL — ABNORMAL HIGH (ref 70–99)
Glucose-Capillary: 141 mg/dL — ABNORMAL HIGH (ref 70–99)

## 2023-07-26 SURGERY — REPAIR, HERNIA, INGUINAL, ADULT
Anesthesia: General | Site: Abdomen | Laterality: Left

## 2023-07-26 MED ORDER — ACETAMINOPHEN 500 MG PO TABS: 500.0000 mg | ORAL_TABLET | Freq: Three times a day (TID) | ORAL | 0 refills | Status: AC

## 2023-07-26 MED ORDER — BUPIVACAINE HCL (PF) 0.25 % IJ SOLN
INTRAMUSCULAR | Status: DC | PRN
  Administered 2023-07-26: 30 mL via PERINEURAL

## 2023-07-26 MED ORDER — FENTANYL CITRATE PF 50 MCG/ML IJ SOSY: 25.0000 ug | PREFILLED_SYRINGE | INTRAMUSCULAR | Status: DC | PRN

## 2023-07-26 MED ORDER — FENTANYL CITRATE (PF) 250 MCG/5ML IJ SOLN
INTRAMUSCULAR | Status: AC
  Filled 2023-07-26: qty 5

## 2023-07-26 MED ORDER — OXYCODONE HCL 5 MG/5ML PO SOLN: 5.0000 mg | Freq: Once | ORAL | Status: AC | PRN

## 2023-07-26 MED ORDER — EPHEDRINE SULFATE (PRESSORS) 50 MG/ML IJ SOLN
INTRAMUSCULAR | Status: DC | PRN
  Administered 2023-07-26: 5 mg via INTRAVENOUS

## 2023-07-26 MED ORDER — AMISULPRIDE (ANTIEMETIC) 5 MG/2ML IV SOLN: 10.0000 mg | Freq: Once | INTRAVENOUS | Status: DC | PRN

## 2023-07-26 MED ORDER — INSULIN ASPART 100 UNIT/ML IJ SOLN: 0.0000 [IU] | INTRAMUSCULAR | Status: DC | PRN

## 2023-07-26 MED ORDER — CHLORHEXIDINE GLUCONATE CLOTH 2 % EX PADS: 6.0000 | MEDICATED_PAD | Freq: Once | CUTANEOUS | Status: DC

## 2023-07-26 MED ORDER — LIDOCAINE HCL (CARDIAC) PF 100 MG/5ML IV SOSY
PREFILLED_SYRINGE | INTRAVENOUS | Status: DC | PRN
  Administered 2023-07-26: 60 mg via INTRAVENOUS

## 2023-07-26 MED ORDER — FENTANYL CITRATE PF 50 MCG/ML IJ SOSY
PREFILLED_SYRINGE | INTRAMUSCULAR | Status: AC
  Administered 2023-07-26: 50 ug via INTRAVENOUS
  Filled 2023-07-26: qty 1

## 2023-07-26 MED ORDER — TRAMADOL HCL 50 MG PO TABS: 50.0000 mg | ORAL_TABLET | Freq: Four times a day (QID) | ORAL | 0 refills | Status: AC | PRN

## 2023-07-26 MED ORDER — FENTANYL CITRATE (PF) 100 MCG/2ML IJ SOLN
INTRAMUSCULAR | Status: DC | PRN
  Administered 2023-07-26: 50 ug via INTRAVENOUS
  Administered 2023-07-26: 25 ug via INTRAVENOUS
  Administered 2023-07-26: 50 ug via INTRAVENOUS

## 2023-07-26 MED ORDER — LIDOCAINE HCL (PF) 2 % IJ SOLN
INTRAMUSCULAR | Status: DC | PRN
  Administered 2023-07-26: 1.5 mg/kg/h via INTRADERMAL

## 2023-07-26 MED ORDER — ROCURONIUM BROMIDE 100 MG/10ML IV SOLN
INTRAVENOUS | Status: DC | PRN
  Administered 2023-07-26: 60 mg via INTRAVENOUS

## 2023-07-26 MED ORDER — ACETAMINOPHEN 500 MG PO TABS
1000.0000 mg | ORAL_TABLET | Freq: Once | ORAL | Status: AC
  Administered 2023-07-26: 1000 mg via ORAL
  Filled 2023-07-26: qty 2

## 2023-07-26 MED ORDER — SUGAMMADEX SODIUM 200 MG/2ML IV SOLN
INTRAVENOUS | Status: DC | PRN
  Administered 2023-07-26: 200 mg via INTRAVENOUS

## 2023-07-26 MED ORDER — OXYCODONE HCL 5 MG PO TABS: 5.0000 mg | ORAL_TABLET | Freq: Once | ORAL | Status: AC | PRN

## 2023-07-26 MED ORDER — CHLORHEXIDINE GLUCONATE 0.12 % MT SOLN
15.0000 mL | Freq: Once | OROMUCOSAL | Status: AC
  Administered 2023-07-26: 15 mL via OROMUCOSAL

## 2023-07-26 MED ORDER — EPHEDRINE 5 MG/ML INJ
INTRAVENOUS | Status: AC
  Filled 2023-07-26: qty 5

## 2023-07-26 MED ORDER — LIDOCAINE HCL (PF) 2 % IJ SOLN
INTRAMUSCULAR | Status: AC
  Filled 2023-07-26: qty 5

## 2023-07-26 MED ORDER — ONDANSETRON HCL 4 MG/2ML IJ SOLN
INTRAMUSCULAR | Status: AC
  Filled 2023-07-26: qty 2

## 2023-07-26 MED ORDER — CEFAZOLIN SODIUM-DEXTROSE 2-4 GM/100ML-% IV SOLN
2.0000 g | INTRAVENOUS | Status: AC
  Administered 2023-07-26: 2 g via INTRAVENOUS
  Filled 2023-07-26: qty 100

## 2023-07-26 MED ORDER — FENTANYL CITRATE PF 50 MCG/ML IJ SOSY: 50.0000 ug | PREFILLED_SYRINGE | Freq: Once | INTRAMUSCULAR | Status: AC

## 2023-07-26 MED ORDER — BUPIVACAINE-EPINEPHRINE 0.25% -1:200000 IJ SOLN
INTRAMUSCULAR | Status: DC | PRN
  Administered 2023-07-26: 20 mL

## 2023-07-26 MED ORDER — PROPOFOL 10 MG/ML IV BOLUS
INTRAVENOUS | Status: AC
  Filled 2023-07-26: qty 20

## 2023-07-26 MED ORDER — PROPOFOL 10 MG/ML IV BOLUS
INTRAVENOUS | Status: DC | PRN
Start: 2023-07-26 — End: 2023-07-26
  Administered 2023-07-26: 20 mg via INTRAVENOUS
  Administered 2023-07-26: 50 mg via INTRAVENOUS

## 2023-07-26 MED ORDER — 0.9 % SODIUM CHLORIDE (POUR BTL) OPTIME
TOPICAL | Status: DC | PRN
  Administered 2023-07-26: 1000 mL

## 2023-07-26 MED ORDER — ONDANSETRON HCL 4 MG/2ML IJ SOLN
INTRAMUSCULAR | Status: DC | PRN
  Administered 2023-07-26: 4 mg via INTRAVENOUS

## 2023-07-26 MED ORDER — ROCURONIUM BROMIDE 10 MG/ML (PF) SYRINGE
PREFILLED_SYRINGE | INTRAVENOUS | Status: AC
  Filled 2023-07-26: qty 10

## 2023-07-26 MED ORDER — BUPIVACAINE-EPINEPHRINE 0.25% -1:200000 IJ SOLN
INTRAMUSCULAR | Status: AC
  Filled 2023-07-26: qty 1

## 2023-07-26 MED ORDER — OXYCODONE HCL 5 MG PO TABS
ORAL_TABLET | ORAL | Status: AC
  Administered 2023-07-26: 5 mg via ORAL
  Filled 2023-07-26: qty 1

## 2023-07-26 MED ORDER — LACTATED RINGERS IV SOLN: INTRAVENOUS | Status: DC

## 2023-07-26 MED ORDER — ORAL CARE MOUTH RINSE: 15.0000 mL | Freq: Once | OROMUCOSAL | Status: AC

## 2023-07-26 SURGICAL SUPPLY — 30 items
APL SKNCLS STERI-STRIP NONHPOA (GAUZE/BANDAGES/DRESSINGS)
BAG COUNTER SPONGE SURGICOUNT (BAG) IMPLANT
BAG SPNG CNTER NS LX DISP (BAG)
BENZOIN TINCTURE PRP APPL 2/3 (GAUZE/BANDAGES/DRESSINGS) IMPLANT
COVER SURGICAL LIGHT HANDLE (MISCELLANEOUS) ×1 IMPLANT
DRAIN PENROSE 0.5X18 (DRAIN) IMPLANT
DRAPE LAPAROTOMY T 98X78 PEDS (DRAPES) ×1 IMPLANT
DRSG TEGADERM 4X4.75 (GAUZE/BANDAGES/DRESSINGS) IMPLANT
ELECT REM PT RETURN 15FT ADLT (MISCELLANEOUS) ×1 IMPLANT
GLOVE BIO SURGEON STRL SZ7.5 (GLOVE) ×1 IMPLANT
GLOVE INDICATOR 8.0 STRL GRN (GLOVE) ×1 IMPLANT
GOWN STRL REUS W/ TWL XL LVL3 (GOWN DISPOSABLE) ×2 IMPLANT
GOWN STRL REUS W/TWL XL LVL3 (GOWN DISPOSABLE) ×2
KIT BASIN OR (CUSTOM PROCEDURE TRAY) ×1 IMPLANT
KIT TURNOVER KIT A (KITS) IMPLANT
MARKER SKIN DUAL TIP RULER LAB (MISCELLANEOUS) ×1 IMPLANT
MESH BARD SOFT 3X6IN (Mesh General) IMPLANT
NDL HYPO 22X1.5 SAFETY MO (MISCELLANEOUS) ×1 IMPLANT
NEEDLE HYPO 22X1.5 SAFETY MO (MISCELLANEOUS) ×1
PACK GENERAL/GYN (CUSTOM PROCEDURE TRAY) ×1 IMPLANT
SPIKE FLUID TRANSFER (MISCELLANEOUS) IMPLANT
STRIP CLOSURE SKIN 1/2X4 (GAUZE/BANDAGES/DRESSINGS) ×1 IMPLANT
SUT MNCRL AB 4-0 PS2 18 (SUTURE) ×1 IMPLANT
SUT PROLENE 2 0 CT2 30 (SUTURE) ×2 IMPLANT
SUT VIC AB 2-0 SH 27 (SUTURE) ×1
SUT VIC AB 2-0 SH 27X BRD (SUTURE) ×1 IMPLANT
SUT VIC AB 3-0 SH 18 (SUTURE) ×1 IMPLANT
SYR 20ML LL LF (SYRINGE) ×1 IMPLANT
TOWEL OR 17X26 10 PK STRL BLUE (TOWEL DISPOSABLE) ×1 IMPLANT
TOWEL OR NON WOVEN STRL DISP B (DISPOSABLE) ×1 IMPLANT

## 2023-07-26 NOTE — Op Note (Signed)
Open Left Indirect Inguinal Hernia Repair with Mesh Procedure Note  Indications: The patient presented with a history of a left, reducible hernia.    Pre-operative Diagnosis: left reducible  Post-operative Diagnosis: same, indirect  Surgeon: Gaynelle Adu MD FACS  Assistants: none  Anesthesia: General endotracheal anesthesia and TAP block  Surgeon: Mary Sella. Andrey Campanile, MD, FACS  Procedure Details  The patient was seen again in the Holding Room. The risks, benefits, complications, treatment options, and expected outcomes were discussed with the patient. The possibilities of reaction to medication, pulmonary aspiration, perforation of viscus, bleeding, recurrent infection, the need for additional procedures, and development of a complication requiring transfusion or further operation were discussed with the patient and/or family. The likelihood of success in repairing the hernia and returning the patient to their previous functional status is good.  There was concurrence with the proposed plan, and informed consent was obtained. The site of surgery was properly noted/marked. The patient was taken to the Operating Room, identified as David [name], and the procedure verified as left inguinal hernia repair. A Time Out was held and the above information confirmed.  The patient was placed in the supine position and underwent induction of anesthesia. The lower abdomen and groin was prepped with Chloraprep and draped in the standard fashion, and 0.25% Marcaine with epinephrine was used to anesthetize the skin over the mid-portion of the inguinal canal. An oblique incision was made. Dissection was carried down through the subcutaneous tissue with cautery to the external oblique fascia.  We opened the external oblique fascia along the direction of its fibers to the external ring.  The spermatic cord was circumferentially dissected bluntly and retracted with a Penrose drain.  The floor of the inguinal canal  was inspected and there was no direct defect.  We skeletonized the spermatic cord and isolated a small cord lipoma which was excised with cautery and discarded and an indirect sac which was reduced. The ilioinguinal nerve was preserved..  We used a 3 x 6 inch piece of Bard soft mesh, which was cut into a keyhole shape.  This was secured with 2-0 Prolene, beginning at the pubic tubercle, running this along the shelving edge inferiorly. Superiorly, the mesh was secured to the internal oblique fascia with interrupted 2-0 Prolene sutures.  The tails of the mesh were sutured together behind the spermatic cord.  The mesh was tucked underneath the external oblique fascia laterally.  The external oblique fascia was reapproximated with 2-0 Vicryl.  3-0 Vicryl was used to close the subcutaneous tissues and 4-0 Monocryl was used to close the skin in subcuticular fashion.  Dermabond was used to seal the incision.   The patient was then extubated and brought to the recovery room in stable condition.  All sponge, instrument, and needle counts were correct prior to closure and at the conclusion of the case.   Estimated Blood Loss: Minimal                 Complications: None; patient tolerated the procedure well.         Disposition: PACU - hemodynamically stable.         Condition: stable  Mary Sella. Andrey Campanile, MD, FACS General, Bariatric, & Minimally Invasive Surgery [facility], [place]

## 2023-07-26 NOTE — Transfer of Care (Signed)
Immediate Anesthesia Transfer of Care Note  Patient: David [name]  Procedure(s) Performed: OPEN LEFT INGUINAL HERNIA REPAIR WITH MESH (Left: Abdomen)  Patient Location: PACU  Anesthesia Type:General  Level of Consciousness: awake, alert , oriented, and patient cooperative  Airway & Oxygen Therapy: Patient Spontanous Breathing and Patient connected to face mask oxygen  Post-op Assessment: Report given to RN, Post -op Vital signs reviewed and stable, and Patient moving all extremities  Post vital signs: Reviewed and stable  Last Vitals:  Vitals Value Taken Time  BP 162/86 07/26/23 1317  Temp    Pulse 146 07/26/23 1318  Resp 13 07/26/23 1319  SpO2 88 % 07/26/23 1318  Vitals shown include unfiled device data.  Last Pain:  Vitals:   07/26/23 1145  TempSrc:   PainSc: 0-No pain         Complications: No notable events documented.

## 2023-07-26 NOTE — Anesthesia Procedure Notes (Signed)
Procedure Name: Intubation Date/Time: 07/26/2023 11:59 AM  Performed by: Johnette Abraham, CRNAPre-anesthesia Checklist: Patient identified, Emergency Drugs available, Suction available and Patient being monitored Patient Re-evaluated:Patient Re-evaluated prior to induction Oxygen Delivery Method: Circle System Utilized Preoxygenation: Pre-oxygenation with 100% oxygen Induction Type: IV induction Ventilation: Mask ventilation without difficulty Laryngoscope Size: Mac and 4 Grade View: Grade II Tube type: Oral Tube size: 8.0 mm Number of attempts: 1 Airway Equipment and Method: Stylet and Oral airway Placement Confirmation: ETT inserted through vocal cords under direct vision, positive ETCO2 and breath sounds checked- equal and bilateral Secured at: 24 cm Tube secured with: Tape Dental Injury: Teeth and Oropharynx as per pre-operative assessment

## 2023-07-26 NOTE — Anesthesia Procedure Notes (Signed)
Anesthesia Regional Block: TAP block   Pre-Anesthetic Checklist: , timeout performed,  Correct Patient, Correct Site, Correct Laterality,  Correct Procedure, Correct Position, site marked,  Risks and benefits discussed,  Surgical consent,  Pre-op evaluation,  At surgeon's request and post-op pain management  Laterality: Left  Prep: chloraprep       Needles:  Injection technique: Single-shot  Needle Type: Echogenic Stimulator Needle     Needle Length: 9cm  Needle Gauge: 21     Additional Needles:   Procedures:,,,, ultrasound used (permanent image in chart),,    Narrative:  Start time: 07/26/2023 11:37 AM End time: 07/26/2023 11:40 AM Injection made incrementally with aspirations every 5 mL.  Performed by: Personally  Anesthesiologist: Linton Rump, MD  Additional Notes: Discussed risks and benefits of nerve block including, but not limited to, prolonged and/or permanent nerve injury involving sensory and/or motor function. Monitors were applied and a time-out was performed. The nerve and associated structures were visualized under ultrasound guidance. After negative aspiration, local anesthetic was slowly injected around the nerve. There was no evidence of high pressure during the procedure. There were no paresthesias. VSS remained stable and the patient tolerated the procedure well.

## 2023-07-26 NOTE — Anesthesia Postprocedure Evaluation (Signed)
Anesthesia Post Note  Patient: David [name]  Procedure(s) Performed: OPEN LEFT INGUINAL HERNIA REPAIR WITH MESH (Left: Abdomen)     Patient location during evaluation: PACU Anesthesia Type: General Level of consciousness: awake Pain management: pain level controlled Vital Signs Assessment: post-procedure vital signs reviewed and stable Respiratory status: spontaneous breathing, nonlabored ventilation and respiratory function stable Cardiovascular status: blood pressure returned to baseline and stable Postop Assessment: no apparent nausea or vomiting Anesthetic complications: no   No notable events documented.  Last Vitals:  Vitals:   07/26/23 1345 07/26/23 1400  BP: (!) 156/70 (!) 156/69  Pulse: (!) 58 (!) 55  Resp: 13 12  Temp: 36.4 C   SpO2: 95% 97%    Last Pain:  Vitals:   07/26/23 1400  TempSrc:   PainSc: 2                  Linton Rump

## 2023-07-26 NOTE — Discharge Instructions (Addendum)
Resume plavix on Sunday 8/4    UMBILICAL OR INGUINAL HERNIA REPAIR: POST OP INSTRUCTIONS  Always review your discharge instruction sheet given to you by the facility where your surgery was performed. IF YOU HAVE DISABILITY OR FAMILY LEAVE FORMS, YOU MUST BRING THEM TO THE OFFICE FOR PROCESSING.   DO NOT GIVE THEM TO YOUR DOCTOR.  A  prescription for pain medication may be given to you upon discharge.  Take your pain medication as prescribed, if needed.  If narcotic pain medicine is not needed, then you may take acetaminophen (Tylenol) or ibuprofen (Advil) as needed. Take your usually prescribed medications unless otherwise directed. If you need a refill on your pain medication, please contact your pharmacy.  They will contact our office to request authorization. Prescriptions will not be filled after 5 pm or on week-ends. You should follow a light diet the first 24 hours after arrival home, such as soup and crackers, etc.  Be sure to include lots of fluids daily.  Resume your normal diet the day after surgery. Most patients will experience some swelling and bruising around the umbilicus or in the groin and scrotum.  Ice packs and reclining will help.  Swelling and bruising can take several days to resolve.  It is common to experience some constipation if taking pain medication after surgery.  Increasing fluid intake and taking a stool softener (such as Colace) will usually help or prevent this problem from occurring.  A mild laxative (Milk of Magnesia or Miralax) should be taken according to package directions if there are no bowel movements after 48 hours. Unless discharge instructions indicate otherwise, you may remove your bandages 24-48 hours after surgery, and you may shower at that time.  You may have steri-strips (small skin tapes) in place directly over the incision.  These strips should be left on the skin for 7-10 days.  If your surgeon used skin glue on the incision, you may shower in 24  hours.  The glue will flake off over the next 2-3 weeks.  Any sutures or staples will be removed at the office during your follow-up visit. ACTIVITIES:  You may resume regular (light) daily activities beginning the next day--such as daily self-care, walking, climbing stairs--gradually increasing activities as tolerated.  You may have sexual intercourse when it is comfortable.  Refrain from any heavy lifting or straining until approved by your doctor. You may drive when you are no longer taking prescription pain medication, you can comfortably wear a seatbelt, and you can safely maneuver your car and apply brakes. RETURN TO WORK:  You should see your doctor in the office for a follow-up appointment approximately 2-3 weeks after your surgery.  Make sure that you call for this appointment within a day or two after you arrive home to insure a convenient appointment time. OTHER INSTRUCTIONS:     WHEN TO CALL YOUR DOCTOR: Fever over 101.0 Inability to urinate Nausea and/or vomiting Extreme swelling or bruising Continued bleeding from incision. Increased pain, redness, or drainage from the incision  The clinic staff is available to answer your questions during regular business hours.  Please don't hesitate to call and ask to speak to one of the nurses for clinical concerns.  If you have a medical emergency, go to the nearest emergency room or call 911.  A surgeon from [facility] is always on call at the hospital   [address], [address], [place], [place]  [address] ?  [id number], [place],  [place]   [address] [phone] ? [phone] ? FAX [phone] Web site: [email or web address]

## 2023-07-26 NOTE — H&P (Signed)
CC: here for surgery  Requesting provider: n/a  HPI: David [name] is an 89 y.o. male who is here for open repair of left inguinal hernia with mesh. Denies changes since seen in clinic in June. Stopped plavix on Saturday  Old hpi:  David [name] is a 89 y.o. male who is seen today as an office consultation at the request of Dr. Nedra Hai for evaluation of New Patient (New pt left inguinal hernia ) .   Has medical history includes a remote history of colon cancer in 2007, COPD, hyperlipidemia, hypertension, diabetes mellitus type 2, chronic kidney disease, AAA s/p repair; he also has had a prior colectomy  He is accompanied by his son.  He states that he has had a bulge in his groin for about 1 month. At first it did cause some stinging which she describes as bee stings but now it does not cause burning, shooting or stabbing pain. It is there pretty much constantly but will increase in size throughout the day. No nausea or vomiting. No diarrhea. Has a bowel movement about every other day. Has a history of loose soft stools that took a while to improve. He states he was put on Creon which to help with his stool consistency along with Metamucil. He has a history of bladder cancer and states that Dr. Annabell Howells would like to treat him again with 3 rounds of gemcitabine.  He has a remote history of stroke x 2 in 2006. He currently denies any TIAs or amaurosis fugax. He does have a cardiologist. Denies any trouble urinating.  He is on Plavix.   Past Medical History:  Diagnosis Date   AAA (abdominal aortic aneurysm) (HCC)    AAA (abdominal aortic aneurysm) (HCC)    AAA (abdominal aortic aneurysm) (HCC)    Anxiety    Atherosclerosis    pt denies this   BPH (benign prostatic hyperplasia)    CKD (chronic kidney disease), stage III (HCC)    family reports caused by scans but that has resolved since CT scans have stopped   Colon cancer (HCC)    COPD (chronic obstructive pulmonary disease) (HCC)     Diabetes mellitus    ED (erectile dysfunction)    Gastroesophageal reflux disease    Heart murmur    Hyperlipidemia    Hypertension    Lung nodules    Neuromuscular disorder (HCC)    Neuropathy feet   Osteopenia    Peripheral vascular disease (HCC)    Pneumonia 05/2012   Positional vertigo    intermittent    Shortness of breath    chronic per patient   Stroke (HCC)    x2 in 2006   Stroke [facility]    Thyroid cancer (HCC)    Thyroid disease    Type 2 diabetes mellitus (HCC)    Vitamin D deficiency     Past Surgical History:  Procedure Laterality Date   ABDOMINAL AORTIC ANEURYSM REPAIR N/A 11/08/2014   Procedure: ANEURYSM ABDOMINAL AORTIC REPAIR;  Surgeon: Larina Earthly, MD;  Location: [facility] [place] [facility];  Service: Vascular;  Laterality: N/A;   ABDOMINAL AORTIC ANEURYSM REPAIR     CATARACT EXTRACTION Bilateral    COLON RESECTION  2007   COLON SURGERY  2006   Resection   CYSTOSCOPY W/ RETROGRADES Bilateral 12/30/2020   Procedure: CYSTOSCOPY WITH BILATERAL RETROGRADE;  Surgeon: Bjorn Pippin, MD;  Location: [facility];  Service: Urology;  Laterality: Bilateral;   CYSTOSCOPY WITH BIOPSY N/A 12/30/2020   Procedure: CYSTOSCOPY WITH  BIOPSY AND FULGURATION;  Surgeon: Bjorn Pippin, MD;  Location: [facility];  Service: Urology;  Laterality: N/A;   SEPTOPLASTY     THYROIDECTOMY  2007   TRANSURETHRAL RESECTION OF BLADDER TUMOR WITH MITOMYCIN-C N/A 02/12/2017   Procedure: TRANSURETHRAL RESECTION OF BLADDER TUMOR;  Surgeon: Bjorn Pippin, MD;  Location: [facility];  Service: Urology;  Laterality: N/A;    Family History  Problem Relation Age of Onset   Stomach cancer Mother    Heart failure Mother    Breast cancer Mother    Pneumonia Father    Diabetes Brother    Heart disease Brother        Heart Disease before age 12,  AAA and  Carotid   Heart attack Brother    Diabetes Paternal Uncle     Social:  reports that he quit smoking about 18 years ago. His smoking use included cigarettes. He started smoking  about 68 years ago. He has a 75 pack-year smoking history. He has never been exposed to tobacco smoke. He has never used smokeless tobacco. He reports that he does not drink alcohol and does not use drugs.  Allergies:  Allergies  Allergen Reactions   Azithromycin Rash    zpack   Doxycycline Rash    Medications: I have reviewed the patient's current medications.   ROS - all of the below systems have been reviewed with the patient and positives are indicated with bold text General: chills, fever or night sweats Eyes: blurry vision or double vision ENT: epistaxis or sore throat Allergy/Immunology: itchy/watery eyes or nasal congestion Hematologic/Lymphatic: bleeding problems, blood clots or swollen lymph nodes Endocrine: temperature intolerance or unexpected weight changes Breast: new or changing breast lumps or nipple discharge Resp: cough, shortness of breath, or wheezing CV: chest pain or dyspnea on exertion GI: as per HPI GU: dysuria, trouble voiding, or hematuria MSK: joint pain or joint stiffness Neuro: TIA or stroke symptoms Derm: pruritus and skin lesion changes Psych: anxiety and depression  PE Blood pressure 132/64, pulse (!) 40, temperature 97.6 F (36.4 C), temperature source Oral, resp. rate 20, height 6' (1.829 m), weight 75.3 kg, SpO2 99%. Constitutional: NAD; conversant; no deformities Eyes: Moist conjunctiva; no lid lag; anicteric; PERRL Neck: Trachea midline; no thyromegaly Lungs: Normal respiratory effort; no tactile fremitus CV: RRR; no palpable thrills; no pitting edema GI: Abd soft, nt, L groin bulge; no palpable hepatosplenomegaly MSK:  no clubbing/cyanosis Psychiatric: Appropriate affect; alert and oriented x3 Lymphatic: No palpable cervical or axillary lymphadenopathy Skin:no rash  No results found for this or any previous visit (from the past 48 hour(s)).  No results found.  Imaging:   A/P: David [name] is an 89 y.o. male with  Left  inguinal hernia  Chronic obstructive pulmonary disease, unspecified COPD type (CMS/HHS-HCC)  Hypothyroidism, unspecified type  Diabetic peripheral neuropathy associated with type 2 diabetes mellitus (CMS/HHS-HCC)  History of stroke  Stage 3 chronic kidney disease, unspecified whether stage 3a or 3b CKD (CMS/HHS-HCC)  Malignant neoplasm of urinary bladder, unspecified site (CMS/HHS-HCC)  Antiplatelet or antithrombotic long-term use   To OR for open repair of LIH Eras  IV abx Preop TAP block All questions asked and answered  Mary Sella. Andrey Campanile, MD, FACS General, Bariatric, & Minimally Invasive Surgery [facility]

## 2023-07-29 ENCOUNTER — Encounter (HOSPITAL_COMMUNITY): Payer: Self-pay | Admitting: General Surgery

## 2023-08-15 ENCOUNTER — Ambulatory Visit: Payer: Medicare Other | Admitting: Podiatry

## 2023-08-15 ENCOUNTER — Encounter: Payer: Self-pay | Admitting: Podiatry

## 2023-08-15 DIAGNOSIS — D689 Coagulation defect, unspecified: Secondary | ICD-10-CM | POA: Diagnosis not present

## 2023-08-15 DIAGNOSIS — B351 Tinea unguium: Secondary | ICD-10-CM

## 2023-08-15 DIAGNOSIS — Q828 Other specified congenital malformations of skin: Secondary | ICD-10-CM | POA: Diagnosis not present

## 2023-08-15 DIAGNOSIS — M79676 Pain in unspecified toe(s): Secondary | ICD-10-CM | POA: Diagnosis not present

## 2023-08-15 DIAGNOSIS — E1142 Type 2 diabetes mellitus with diabetic polyneuropathy: Secondary | ICD-10-CM

## 2023-08-15 NOTE — Progress Notes (Signed)
This patient returns to my office for at risk foot care.  This patient requires this care by a professional since this patient will be at risk due to having chronic kidney disease, thrombocytopenia and type 2 diabetes.   Patient is taking plavix.    This patient is unable to cut nails himself since the patient cannot reach his nails.These nails are painful walking and wearing shoes.  Relates painful callus areas as well.  This patient presents for at risk foot care today.   General Appearance  Alert, conversant and in no acute stress.  Vascular  Dorsalis pedis and posterior tibial  pulses are weakly  palpable  bilaterally.  Capillary return is within normal limits  bilaterally. Temperature is within normal limits  Bilaterally.  Venous stasis  B/L.  Neurologic  Senn-Weinstein monofilament wire test absent   bilaterally. Muscle power within normal limits bilaterally.  Nails Thick disfigured discolored nails with subungual debris  from hallux to fifth toes bilaterally. No evidence of bacterial infection or drainage bilaterally. Hyperkeratotic tissue noted bilateral plantar fifth metatarsals.   Orthopedic  No limitations of motion  feet .  No crepitus or effusions noted.  HAV  B/L.  Hammer toes  B/L.  Exostosis IPJ left hallux.  Skin  normotropic skin with no porokeratosis noted bilaterally.  No signs of infections or ulcers noted.   Porokeratosis sub 5th  Left foot.  Onychomycosis  Pain in right toes  Pain in left toes  Consent was obtained for treatment procedures.   Mechanical debridement of nails 1-5  bilaterally performed with a nail nipper.  Filed with dremel without incident. Hyperkeratotic tissue debrided without incident x2 bilateral.   Will get scheduled for DM shoe fitting as well as due for new shoes.   Return office visit   10 weeks       Told patient to return for periodic foot care and evaluation due to potential at risk complications.   Louann Sjogren DPM

## 2023-10-25 ENCOUNTER — Emergency Department (HOSPITAL_BASED_OUTPATIENT_CLINIC_OR_DEPARTMENT_OTHER)
Admission: EM | Admit: 2023-10-25 | Discharge: 2023-10-26 | Disposition: A | Discharge status: Home / Self Care | Payer: Medicare Other | Attending: Emergency Medicine | Admitting: Emergency Medicine

## 2023-10-25 ENCOUNTER — Encounter (HOSPITAL_BASED_OUTPATIENT_CLINIC_OR_DEPARTMENT_OTHER): Payer: Self-pay | Admitting: *Deleted

## 2023-10-25 ENCOUNTER — Other Ambulatory Visit: Payer: Self-pay

## 2023-10-25 DIAGNOSIS — N183 Chronic kidney disease, stage 3 unspecified: Secondary | ICD-10-CM | POA: Diagnosis not present

## 2023-10-25 DIAGNOSIS — Z85038 Personal history of other malignant neoplasm of large intestine: Secondary | ICD-10-CM | POA: Diagnosis not present

## 2023-10-25 DIAGNOSIS — N281 Cyst of kidney, acquired: Secondary | ICD-10-CM | POA: Insufficient documentation

## 2023-10-25 DIAGNOSIS — N400 Enlarged prostate without lower urinary tract symptoms: Secondary | ICD-10-CM | POA: Diagnosis not present

## 2023-10-25 DIAGNOSIS — K5900 Constipation, unspecified: Secondary | ICD-10-CM | POA: Insufficient documentation

## 2023-10-25 DIAGNOSIS — E1142 Type 2 diabetes mellitus with diabetic polyneuropathy: Secondary | ICD-10-CM | POA: Diagnosis not present

## 2023-10-25 DIAGNOSIS — Z85850 Personal history of malignant neoplasm of thyroid: Secondary | ICD-10-CM | POA: Insufficient documentation

## 2023-10-25 DIAGNOSIS — J449 Chronic obstructive pulmonary disease, unspecified: Secondary | ICD-10-CM | POA: Diagnosis not present

## 2023-10-25 DIAGNOSIS — I5032 Chronic diastolic (congestive) heart failure: Secondary | ICD-10-CM | POA: Insufficient documentation

## 2023-10-25 DIAGNOSIS — I130 Hypertensive heart and chronic kidney disease with heart failure and stage 1 through stage 4 chronic kidney disease, or unspecified chronic kidney disease: Secondary | ICD-10-CM | POA: Insufficient documentation

## 2023-10-25 DIAGNOSIS — E039 Hypothyroidism, unspecified: Secondary | ICD-10-CM | POA: Insufficient documentation

## 2023-10-25 DIAGNOSIS — E1122 Type 2 diabetes mellitus with diabetic chronic kidney disease: Secondary | ICD-10-CM | POA: Insufficient documentation

## 2023-10-25 LAB — COMPREHENSIVE METABOLIC PANEL
ALT: 52 U/L — ABNORMAL HIGH (ref 0–44)
AST: 35 U/L (ref 15–41)
Albumin: 3.8 g/dL (ref 3.5–5.0)
Alkaline Phosphatase: 106 U/L (ref 38–126)
Anion gap: 13 (ref 5–15)
BUN: 44 mg/dL — ABNORMAL HIGH (ref 8–23)
CO2: 20 mmol/L — ABNORMAL LOW (ref 22–32)
Calcium: 8.9 mg/dL (ref 8.9–10.3)
Chloride: 101 mmol/L (ref 98–111)
Creatinine, Ser: 1.94 mg/dL — ABNORMAL HIGH (ref 0.61–1.24)
GFR, Estimated: 32 mL/min — ABNORMAL LOW (ref >60)
Glucose, Bld: 470 mg/dL — ABNORMAL HIGH (ref 70–99)
Potassium: 4.6 mmol/L (ref 3.5–5.1)
Sodium: 134 mmol/L — ABNORMAL LOW (ref 135–145)
Total Bilirubin: 0.7 mg/dL (ref 0.3–1.2)
Total Protein: 6.5 g/dL (ref 6.5–8.1)

## 2023-10-25 LAB — CBC
HCT: 34.9 % — ABNORMAL LOW (ref 39.0–52.0)
Hemoglobin: 12.1 g/dL — ABNORMAL LOW (ref 13.0–17.0)
MCH: 33.5 pg (ref 26.0–34.0)
MCHC: 34.7 g/dL (ref 30.0–36.0)
MCV: 96.7 fL (ref 80.0–100.0)
Platelets: 164 10*3/uL (ref 150–400)
RBC: 3.61 MIL/uL — ABNORMAL LOW (ref 4.22–5.81)
RDW: 13.5 % (ref 11.5–15.5)
WBC: 7.8 10*3/uL (ref 4.0–10.5)
nRBC: 0 % (ref 0.0–0.2)

## 2023-10-25 LAB — LIPASE, BLOOD: Lipase: 21 U/L (ref 11–51)

## 2023-10-25 MED ORDER — FLEET ENEMA RE ENEM
1.0000 | ENEMA | Freq: Once | RECTAL | Status: AC
  Administered 2023-10-26: 1 via RECTAL
  Filled 2023-10-25: qty 1

## 2023-10-25 NOTE — ED Triage Notes (Addendum)
Pt reports that he is constipated and has been unable to have a BM for 3-4 days.  Pt states that he feels like he has a "hard plug"  pt also reports that he has been unable to void since this am.  Pt is on Levofloxacin for UTI and Prednisone for COPD.  Pt had two dulcolax suppositiories without success today.

## 2023-10-25 NOTE — ED Provider Notes (Signed)
[facility] Provider Note  CSN: [id number] Arrival date & time: 10/25/23 2244  Chief Complaint(s) Constipation and Urinary Retention  HPI David [name] is a 89 y.o. male with past medical history as below, significant for AAA repair, CKD stage III, colon cancer s/p resection, COPD, HLD, PVD, CVA, bladder cancer status post transurethral resection who presents to the ED with complaint of constipation, unable to void  Patient reports no bowel movement x 3 days, normally has bowel movement daily.  Tried Dulcolax x 2 today without relief.  No nausea or vomiting.  He has some suprapubic discomfort over the past 24 hours.  Has not been on to void since early this morning.  Tolerating p.o. without difficulty, no nausea or vomiting.  No fevers or chills.  No diarrhea.  No rectal bleeding.  Reports he feels the urge to have a bowel movement but unable to produce stool  Of note he is completing treatment of pneumonia with Levaquin, reports respiratory symptoms greatly improved.  Past Medical History Past Medical History:  Diagnosis Date   AAA (abdominal aortic aneurysm) (HCC)    AAA (abdominal aortic aneurysm) (HCC)    AAA (abdominal aortic aneurysm) (HCC)    Anxiety    Atherosclerosis    pt denies this   BPH (benign prostatic hyperplasia)    CKD (chronic kidney disease), stage III (HCC)    family reports caused by scans but that has resolved since CT scans have stopped   Colon cancer (HCC)    COPD (chronic obstructive pulmonary disease) (HCC)    Diabetes mellitus    ED (erectile dysfunction)    Gastroesophageal reflux disease    Heart murmur    Hyperlipidemia    Hypertension    Lung nodules    Neuromuscular disorder (HCC)    Neuropathy feet   Osteopenia    Peripheral vascular disease (HCC)    Pneumonia 05/2012   Positional vertigo    intermittent    Shortness of breath    chronic per patient   Stroke (HCC)    x2 in 2006   Stroke  [facility])    Thyroid cancer (HCC)    Thyroid disease    Type 2 diabetes mellitus (HCC)    Vitamin D deficiency    Patient Active Problem List   Diagnosis Date Noted   Anxiety 02/28/2022   Hypertensive heart and chronic kidney disease with heart failure and stage 1 through stage 4 chronic kidney disease, or unspecified chronic kidney disease (HCC) 02/28/2022   Low back pain 02/28/2022   Oxygen dependent 02/28/2022   Aortic stenosis, mild 01/19/2022   COPD with acute exacerbation (HCC) 11/09/2021   PVC's (premature ventricular contractions) 04/24/2021   PAC (premature atrial contraction) 04/24/2021   Thoracic aortic aneurysm without rupture (HCC) 04/24/2021   Aortic atherosclerosis (HCC) 04/24/2021   History of stroke 04/24/2021   Hypertension associated with diabetes (HCC) 04/24/2021   Porokeratosis 12/09/2020   Enthesopathy 08/04/2018   Hemoptysis 07/23/2017   Right upper lobe pneumonia 07/23/2017   Malignant tumor of urinary bladder (HCC) 05/06/2017   Allergic rhinitis 10/10/2016   Dysuria 08/29/2016   Benign prostatic hyperplasia with lower urinary tract symptoms 01/12/2016   Cataract 01/12/2016   Herpes zoster without complication 10/17/2015   Chronic diastolic heart failure (HCC) 06/24/2015   CAP (community acquired pneumonia) 06/23/2015   CKD (chronic kidney disease) stage 3, GFR 30-59 ml/min (HCC) 06/23/2015   Hypothyroidism 06/23/2015   Leukocytosis 06/23/2015  Thrombocytopenia (HCC) 06/23/2015   Acute respiratory failure with hypoxia (HCC)    Type 2 diabetes mellitus with diabetic chronic kidney disease (HCC)    Diabetic peripheral neuropathy associated with type 2 diabetes mellitus (HCC) 05/06/2015   Diabetic renal disease (HCC) 05/06/2015   Non-thrombocytopenic purpura (HCC) 05/06/2015   Abdominal aortic aneurysm without rupture (HCC) 11/08/2014   Gout 07/27/2014   Polyneuropathy 09/19/2012   HTN (hypertension) 06/14/2012   Dyslipidemia 06/14/2012    Gastro-esophageal reflux disease without esophagitis 03/07/2010   Hyperlipidemia associated with type 2 diabetes mellitus (HCC) 03/07/2010   Male erectile disorder 03/07/2010   Malignant tumor of thyroid gland (HCC) 03/07/2010   Vitamin D deficiency 03/07/2010   Cerebral artery occlusion with cerebral infarction (HCC) 01/04/2009   Chronic obstructive pulmonary disease (HCC) 01/04/2009   Home Medication(s) Prior to Admission medications   Medication Sig Start Date End Date Taking? Authorizing Provider  tamsulosin (FLOMAX) 0.4 MG CAPS capsule Take 1 capsule (0.4 mg total) by mouth daily for 7 days. 10/26/23 11/02/23 Yes Sloan Leiter, DO  acetaminophen (TYLENOL) 500 MG tablet Take 1-2 tablets (500-1,000 mg total) by mouth every 8 (eight) hours. 07/26/23   Gaynelle Adu, MD  allopurinol (ZYLOPRIM) 100 MG tablet Take 150 mg by mouth daily.    [provider]  BD PEN NEEDLE NANO 2ND GEN 32G X 4 MM MISC USE WITH INSULIN PENS TO GIVE 4 INJECTIONS DAILY 02/27/22   [provider]  clopidogrel (PLAVIX) 75 MG tablet Take 75 mg by mouth at bedtime.     [provider]  COMBIVENT RESPIMAT 20-100 MCG/ACT AERS respimat Inhale 1 puff into the lungs in the morning and at bedtime. 01/06/20   [provider]  diltiazem (CARDIZEM CD) 120 MG 24 hr capsule TAKE 1 CAPSULE BY MOUTH EVERY DAY 07/08/23   Chandrasekhar, Mahesh A, MD  famotidine (PEPCID) 20 MG tablet Take 1 tablet (20 mg total) by mouth at bedtime. 12/29/21   Martina Sinner, MD  Fluticasone-Salmeterol (ADVAIR) 250-50 MCG/DOSE AEPB Inhale 1 puff into the lungs every 12 (twelve) hours.    [provider]  HUMALOG KWIKPEN 100 UNIT/ML KwikPen Inject 3 Units into the skin See admin instructions. 3 units in the morning and  units late afternoon/supper time 02/29/20   [provider]  ipratropium-albuterol (DUONEB) 0.5-2.5 (3) MG/3ML SOLN Take 3 mLs by nebulization 2 (two) times daily. 05/31/21   [provider]  ketoconazole (NIZORAL) 2 % cream Apply to bottom of feet twice daily Patient taking differently: Apply 1 Application topically 2 (two) times daily as needed for irritation (Apply to bottom of feet). 10/18/22   Marlowe Aschoff P, DPM  LEVEMIR FLEXTOUCH 100 UNIT/ML Pen Inject 5 Units into the skin at bedtime. 04/05/15   [provider]  levothyroxine (SYNTHROID) 100 MCG tablet Take 100 mcg by mouth daily before breakfast.    [provider]  lipase/protease/amylase (CREON) 36000 UNITS CPEP capsule Take 1-2 capsules by mouth See admin instructions. 2 capsule with each meal, 1 capsule with each snack 10/01/22   [provider]  LORazepam (ATIVAN) 0.5 MG tablet Take 0.25-0.5 mg by mouth daily as needed for anxiety. 02/10/21   [provider]  nitroGLYCERIN (NITROSTAT) 0.4 MG SL tablet Place 0.4 mg under the tongue every 5 (five) minutes as needed for chest pain.    [provider]  Lum Babe test strip SMARTSIG:Via Meter 4 Times Daily PRN 05/23/22   [provider]  rosuvastatin (CRESTOR) 10  MG tablet Take 10 mg by mouth at bedtime. 10/18/19   [provider]  valsartan-hydrochlorothiazide (DIOVAN-HCT) 80-12.5 MG tablet Take 1 tablet by mouth daily. 09/16/19   [provider]  Vitamin D, Ergocalciferol, (DRISDOL) 50000 UNITS CAPS Take 50,000 Units by mouth every Monday.     [provider]                                                                                                                                    Past Surgical History Past Surgical History:  Procedure Laterality Date   ABDOMINAL AORTIC ANEURYSM REPAIR N/A 11/08/2014   Procedure: ANEURYSM ABDOMINAL AORTIC REPAIR;  Surgeon: Larina Earthly, MD;  Location: [facility] OR;  Service: Vascular;  Laterality: N/A;   ABDOMINAL AORTIC ANEURYSM REPAIR     CATARACT EXTRACTION Bilateral    COLON RESECTION  2007   COLON SURGERY  2006   Resection   CYSTOSCOPY W/  RETROGRADES Bilateral 12/30/2020   Procedure: CYSTOSCOPY WITH BILATERAL RETROGRADE;  Surgeon: Bjorn Pippin, MD;  Location: [facility] ORS;  Service: Urology;  Laterality: Bilateral;   CYSTOSCOPY WITH BIOPSY N/A 12/30/2020   Procedure: CYSTOSCOPY WITH BIOPSY AND FULGURATION;  Surgeon: Bjorn Pippin, MD;  Location: [facility] ORS;  Service: Urology;  Laterality: N/A;   INGUINAL HERNIA REPAIR Left 07/26/2023   Procedure: OPEN LEFT INGUINAL HERNIA REPAIR WITH MESH;  Surgeon: Gaynelle Adu, MD;  Location: [facility] ORS;  Service: General;  Laterality: Left;   SEPTOPLASTY     THYROIDECTOMY  2007   TRANSURETHRAL RESECTION OF BLADDER TUMOR WITH MITOMYCIN-C N/A 02/12/2017   Procedure: TRANSURETHRAL RESECTION OF BLADDER TUMOR;  Surgeon: Bjorn Pippin, MD;  Location: [facility] ORS;  Service: Urology;  Laterality: N/A;   Family History Family History  Problem Relation Age of Onset   Stomach cancer Mother    Heart failure Mother    Breast cancer Mother    Pneumonia Father    Diabetes Brother    Heart disease Brother        Heart Disease before age 32,  AAA and  Carotid   Heart attack Brother    Diabetes Paternal Uncle     Social History Social History   Tobacco Use   Smoking status: Former    Current packs/day: 0.00    Average packs/day: 1.5 packs/day for 50.0 years (75.0 ttl pk-yrs)    Types: Cigarettes    Start date: 05/29/1955    Quit date: 05/28/2005    Years since quitting: 18.4    Passive exposure: Never   Smokeless tobacco: Never  Vaping Use   Vaping status: Never Used  Substance Use Topics   Alcohol use: No    Alcohol/week: 0.0 standard drinks of alcohol   Drug use: No   Allergies Azithromycin and Doxycycline  Review of Systems Review of Systems  Constitutional:  Negative for chills and fever.  Respiratory:  Negative for chest tightness and shortness of breath.  Gastrointestinal:  Positive for abdominal pain and constipation. Negative for nausea and vomiting.  Genitourinary:  Positive for difficulty urinating.   Skin:  Negative for rash and wound.  Neurological:  Negative for numbness.  All other systems reviewed and are negative.   Physical Exam Vital Signs  I have reviewed the triage vital signs BP (!) 157/74   Pulse 61   Temp 98 F (36.7 C) (Oral)   Resp 17   SpO2 95%  Physical Exam Vitals and nursing note reviewed.  Constitutional:      General: He is not in acute distress.    Appearance: He is well-developed.  HENT:     Head: Normocephalic and atraumatic.     Right Ear: External ear normal.     Left Ear: External ear normal.     Mouth/Throat:     Mouth: Mucous membranes are moist.  Eyes:     General: No scleral icterus. Cardiovascular:     Rate and Rhythm: Normal rate and regular rhythm.     Pulses: Normal pulses.     Heart sounds: Normal heart sounds.  Pulmonary:     Effort: Pulmonary effort is normal. No respiratory distress.     Breath sounds: Normal breath sounds.  Abdominal:     General: Abdomen is flat.     Palpations: Abdomen is soft.     Tenderness: There is abdominal tenderness in the suprapubic area.       Comments: Abd soft, not peritoneal   Musculoskeletal:     Cervical back: No rigidity.     Right lower leg: No edema.     Left lower leg: No edema.  Skin:    General: Skin is warm and dry.     Capillary Refill: Capillary refill takes less than 2 seconds.  Neurological:     Mental Status: He is alert.  Psychiatric:        Mood and Affect: Mood normal.        Behavior: Behavior normal.     ED Results and Treatments Labs (all labs ordered are listed, but only abnormal results are displayed) Labs Reviewed  COMPREHENSIVE METABOLIC PANEL - Abnormal; Notable for the following components:      Result Value   Sodium 134 (*)    CO2 20 (*)    Glucose, Bld 470 (*)    BUN 44 (*)    Creatinine, Ser 1.94 (*)    ALT 52 (*)    GFR, Estimated 32 (*)    All other components within normal limits  CBC - Abnormal; Notable for the following components:   RBC  3.61 (*)    Hemoglobin 12.1 (*)    HCT 34.9 (*)    All other components within normal limits  URINALYSIS, ROUTINE W REFLEX MICROSCOPIC - Abnormal; Notable for the following components:   Glucose, UA >=500 (*)    Hgb urine dipstick TRACE (*)    Protein, ur 100 (*)    All other components within normal limits  URINALYSIS, MICROSCOPIC (REFLEX) - Abnormal; Notable for the following components:   Bacteria, UA RARE (*)    All other components within normal limits  LIPASE, BLOOD  Radiology CT ABDOMEN PELVIS WO CONTRAST  Result Date: 10/26/2023 CLINICAL DATA:  Acute nonlocalized abdominal pain, constipation, urinary retention. History of bladder and colon cancer. EXAM: CT ABDOMEN AND PELVIS WITHOUT CONTRAST TECHNIQUE: Multidetector CT imaging of the abdomen and pelvis was performed following the standard protocol without IV contrast. RADIATION DOSE REDUCTION: This exam was performed according to the departmental dose-optimization program which includes automated exposure control, adjustment of the mA and/or kV according to patient size and/or use of iterative reconstruction technique. COMPARISON:  12/12/2016 FINDINGS: Lower chest: No acute abnormality. Mild bibasilar pulmonary fibrotic changes are noted. Hepatobiliary: Cholelithiasis without pericholecystic inflammatory change. Liver unremarkable on this noncontrast examination. No intra or extrahepatic biliary ductal dilation. Pancreas: Unremarkable Spleen: Unremarkable Adrenals/Urinary Tract: The adrenal glands are unremarkable. Simple exophytic cortical cyst arises from the upper pole the left kidney, minimally enlarged since prior examination, for which no follow-up imaging is recommended. Low-attenuation lesion is seen within the lower pole left kidney which appears enlarged since prior examination, measuring 18 mm, but is not well  characterized on this exam. The kidneys are otherwise unremarkable on this noncontrast examination. The bladder is moderately distended. Stomach/Bowel: Stomach is within normal limits. Appendix appears normal. No evidence of bowel wall thickening, distention, or inflammatory changes. Vascular/Lymphatic: Tube graft repair of the a infrarenal abdominal aorta has been performed. Extensive atherosclerotic calcification is seen of the origin of the renal arteries bilaterally and within the lower extremity arterial inflow and outflow though the degree of stenosis is not well assessed on this noncontrast examination. No pathologic adenopathy within the abdomen and pelvis. Reproductive: The prostate gland is moderately enlarged. Other: There is interval development of a elliptical 1.2 x 3.7 cm loculated fluid collection superficial to the left inguinal canal which may reflect changes of interval left inguinal hernia repair. Mild overlying infiltration may be postsurgical in nature or reflect acute inflammatory change. Super infection is not excluded. No abdominal wall hernia. Musculoskeletal: Osseous structures are age-appropriate. Ankylosis of the sacroiliac joints noted bilaterally. No acute bone abnormality. IMPRESSION: 1. Interval development of a elliptical 1.2 x 3.7 cm loculated fluid collection superficial to the left inguinal canal which may reflect changes of interval left inguinal hernia repair. Mild overlying infiltration may be postsurgical in nature or reflect acute inflammatory change. Super infection is not excluded. 2. Moderate prostatomegaly. Moderate bladder may reflect changes of voluntary retention or bladder outlet obstruction. Catheter decompression may be helpful for further management. 3. Cholelithiasis. 4. Extensive atherosclerotic calcification of the origin of the renal arteries bilaterally and within the lower extremity arterial inflow and outflow though the degree of stenosis is not well  assessed on this noncontrast examination. 5. Low-attenuation lesion within the lower pole left kidney which appears enlarged since prior examination, measuring 18 mm, but is not well characterized on this exam. Nonemergent renal ultrasound examination may be helpful for further evaluation if clinically indicated. Electronically Signed   By: Helyn Numbers M.D.   On: 10/26/2023 00:52    Pertinent labs & imaging results that were available during my care of the patient were reviewed by me and considered in my medical decision making (see MDM for details).  Medications Ordered in ED Medications  sodium phosphate (FLEET) enema 1 enema (1 enema Rectal Given by Other 10/26/23 0045)  Procedures Procedures  (including critical care time)  Medical Decision Making / ED Course    Medical Decision Making:    KEATH MATERA is a 89 y.o. male with past medical history as below, significant for AAA repair, CKD stage III, colon cancer s/p resection, COPD, HLD, PVD, CVA, bladder cancer status post transurethral resection who presents to the ED with complaint of constipation, unable to void. The complaint involves an extensive differential diagnosis and also carries with it a high risk of complications and morbidity.  Serious etiology was considered. Ddx includes but is not limited to: Constipation, fecal impaction, stercoral colitis, bowel obstruction, bladder outlet obstruction, UTI, neoplasm, etc.  Complete initial physical exam performed, notably the patient  was no acute distress, sitting upright, no respiratory distress, abdomen soft.    Reviewed and confirmed nursing documentation for past medical history, family history, social history.  Vital signs reviewed.    Clinical Course as of 10/26/23 0256  Sat Oct 26, 2023  0037 Creatinine(!): 1.94 Similar to baseline, he  has CKD [SG]  0119 Patient had large bowel movement following fleets enema, voiding freely at this time as well. [SG]  0250 Symptoms resolved on recheck, voiding freely, had large BM. CT imaging with abnormality to left inguinal area from prior surgery, he has no discomfort to this area w/wo palpation, no external signs of infection or tissue abnormality on exam.  [SG]    Clinical Course User Index [SG] Sloan Leiter, DO     Patient with constipation, difficulty voiding.  No Valdic x 3 days, no nausea or vomiting.  Will trial enema.  Is also having difficulty voiding, history of bladder cancer s/p resection.  400 mL on bladder scan, will trial enema, if this does not improve urination we will place Foley catheter.    Labs reviewed, these are stable, similar to baseline.  No UTI.  Imaging reviewed, findings noted including renal cyst, cholelithiasis, enlarged prostate, extensive atherosclerosis  Patient had large bowel movement, feeling much better on recheck.  Constipation instructions for home.  Urinary retention today likely secondary to constipation, he has evidence of enlarged prostate on imaging.  Will give short course of Flomax, advised to follow-up with urology as needed  Patient with abnormality noted to prior surgical site of inguinal hernia repair on imaging.  Exam to this area is stable.  No abnormalities on exam.  Advised to follow-up with his surgeon if he develops any discomfort to this area or to return to the ER for any worsening worrisome symptoms.    The patient improved significantly and was discharged in stable condition. Detailed discussions were had with the patient regarding current findings, and need for close f/u with PCP or on call doctor. The patient has been instructed to return immediately if the symptoms worsen in any way for re-evaluation. Patient verbalized understanding and is in agreement with current care plan. All questions answered prior to  discharge.              Additional history obtained: -Additional history obtained from family -External records from outside source obtained and reviewed including: Chart review including previous notes, labs, imaging, consultation notes including  Prior surgical documentation, home medications   Lab Tests: -I ordered, reviewed, and interpreted labs.   The pertinent results include:   Labs Reviewed  COMPREHENSIVE METABOLIC PANEL - Abnormal; Notable for the following components:      Result Value   Sodium 134 (*)    CO2 20 (*)  Glucose, Bld 470 (*)    BUN 44 (*)    Creatinine, Ser 1.94 (*)    ALT 52 (*)    GFR, Estimated 32 (*)    All other components within normal limits  CBC - Abnormal; Notable for the following components:   RBC 3.61 (*)    Hemoglobin 12.1 (*)    HCT 34.9 (*)    All other components within normal limits  URINALYSIS, ROUTINE W REFLEX MICROSCOPIC - Abnormal; Notable for the following components:   Glucose, UA >=500 (*)    Hgb urine dipstick TRACE (*)    Protein, ur 100 (*)    All other components within normal limits  URINALYSIS, MICROSCOPIC (REFLEX) - Abnormal; Notable for the following components:   Bacteria, UA RARE (*)    All other components within normal limits  LIPASE, BLOOD    Notable for labs stable  EKG   EKG Interpretation Date/Time:    Ventricular Rate:    PR Interval:    QRS Duration:    QT Interval:    QTC Calculation:   R Axis:      Text Interpretation:           Imaging Studies ordered: I ordered imaging studies including CT abdomen I independently visualized the following imaging with scope of interpretation limited to determining acute life threatening conditions related to emergency care; findings noted above I independently visualized and interpreted imaging. I agree with the radiologist interpretation   Medicines ordered and prescription drug management: Meds ordered this encounter  Medications    sodium phosphate (FLEET) enema 1 enema   tamsulosin (FLOMAX) 0.4 MG CAPS capsule    Sig: Take 1 capsule (0.4 mg total) by mouth daily for 7 days.    Dispense:  7 capsule    Refill:  0    -I have reviewed the patients home medicines and have made adjustments as needed   Consultations Obtained: na   Cardiac Monitoring: Continuous pulse oximetry interpreted by myself, 99% on RA.    Social Determinants of Health:  Diagnosis or treatment significantly limited by social determinants of health: former smoker   Reevaluation: After the interventions noted above, I reevaluated the patient and found that they have resolved  Co morbidities that complicate the patient evaluation  Past Medical History:  Diagnosis Date   AAA (abdominal aortic aneurysm) (HCC)    AAA (abdominal aortic aneurysm) (HCC)    AAA (abdominal aortic aneurysm) (HCC)    Anxiety    Atherosclerosis    pt denies this   BPH (benign prostatic hyperplasia)    CKD (chronic kidney disease), stage III (HCC)    family reports caused by scans but that has resolved since CT scans have stopped   Colon cancer (HCC)    COPD (chronic obstructive pulmonary disease) (HCC)    Diabetes mellitus    ED (erectile dysfunction)    Gastroesophageal reflux disease    Heart murmur    Hyperlipidemia    Hypertension    Lung nodules    Neuromuscular disorder (HCC)    Neuropathy feet   Osteopenia    Peripheral vascular disease (HCC)    Pneumonia 05/2012   Positional vertigo    intermittent    Shortness of breath    chronic per patient   Stroke (HCC)    x2 in 2006   Stroke [facility])    Thyroid cancer (HCC)    Thyroid disease    Type 2 diabetes mellitus (HCC)  Vitamin D deficiency       Dispostion: Disposition decision including need for hospitalization was considered, and patient discharged from emergency department.    Final Clinical Impression(s) / ED Diagnoses Final diagnoses:  Constipation, unspecified constipation  type  Renal cyst  Enlarged prostate        Sloan Leiter, DO 10/26/23 2440

## 2023-10-26 ENCOUNTER — Ambulatory Visit (HOSPITAL_BASED_OUTPATIENT_CLINIC_OR_DEPARTMENT_OTHER): Payer: Medicare Other

## 2023-10-26 ENCOUNTER — Emergency Department (HOSPITAL_BASED_OUTPATIENT_CLINIC_OR_DEPARTMENT_OTHER): Payer: Medicare Other

## 2023-10-26 LAB — URINALYSIS, MICROSCOPIC (REFLEX)

## 2023-10-26 LAB — URINALYSIS, ROUTINE W REFLEX MICROSCOPIC
Bilirubin Urine: NEGATIVE
Glucose, UA: >=500 mg/dL — AB
Ketones, ur: NEGATIVE mg/dL
Leukocytes,Ua: NEGATIVE
Nitrite: NEGATIVE
Protein, ur: 100 mg/dL — AB
Specific Gravity, Urine: 1.025 (ref 1.005–1.030)
pH: 5.5 (ref 5.0–8.0)

## 2023-10-26 MED ORDER — TAMSULOSIN HCL 0.4 MG PO CAPS: 0.4000 mg | ORAL_CAPSULE | Freq: Every day | ORAL | 0 refills | Status: AC

## 2023-10-26 NOTE — ED Notes (Signed)
Pt able to have bowel movement and urinate, EDP ordered to hold on foley

## 2023-10-26 NOTE — Discharge Instructions (Addendum)
Please follow up with your primary care doctor within 2-3 days. For constipation we also recommend a diet high in fiber (beans, fruits, vegetables, whole grains). Take Colace 100-200 mg up to three times per day. You may take along with Senokot 1-2 tabs, ingest with full glass of water.  You may also take MiraLAX 1-2 capfuls 1-2 times a day until stools become soft and then slowly decrease the amount of MiraLAX used.  Maintain fluid intake 6-8 glasses per day. Please increase fibers in your diet. You may also take Milk of Magnesia 30 mL as needed for constipation, you may repeat in 2 hours again if no bowl movement.  Please keep a close eye on the area he had your prior hernia surgery, left groin.  If you develop pain here or redness of the skin or any worrisome symptoms to see this area please return to the ER or call your surgeon for evaluation.  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  Please follow-up with your primary care doctor in regards to this.  If you continue to have difficulty with urination please follow-up with urology.  You were prescribed a medication/Flomax to help with voiding/urination.   It was a pleasure caring for you today in the emergency department.  Please return to the emergency department for any worsening or worrisome symptoms.

## 2023-11-14 ENCOUNTER — Ambulatory Visit: Payer: Medicare Other | Admitting: Podiatry

## 2023-11-14 ENCOUNTER — Encounter: Payer: Self-pay | Admitting: Podiatry

## 2023-11-14 DIAGNOSIS — B351 Tinea unguium: Secondary | ICD-10-CM | POA: Diagnosis not present

## 2023-11-14 DIAGNOSIS — E1142 Type 2 diabetes mellitus with diabetic polyneuropathy: Secondary | ICD-10-CM | POA: Diagnosis not present

## 2023-11-14 DIAGNOSIS — D689 Coagulation defect, unspecified: Secondary | ICD-10-CM

## 2023-11-14 DIAGNOSIS — Q828 Other specified congenital malformations of skin: Secondary | ICD-10-CM

## 2023-11-14 DIAGNOSIS — M79676 Pain in unspecified toe(s): Secondary | ICD-10-CM

## 2023-11-14 NOTE — Progress Notes (Signed)
This patient returns to my office for at risk foot care.  This patient requires this care by a professional since this patient will be at risk due to having chronic kidney disease, thrombocytopenia and type 2 diabetes.   Patient is taking plavix.    This patient is unable to cut nails himself since the patient cannot reach his nails.These nails are painful walking and wearing shoes.  Relates painful callus areas as well.  This patient presents for at risk foot care today.   General Appearance  Alert, conversant and in no acute stress.  Vascular  Dorsalis pedis and posterior tibial  pulses are weakly  palpable  bilaterally.  Capillary return is within normal limits  bilaterally. Temperature is within normal limits  Bilaterally.  Venous stasis  B/L.  Neurologic  Senn-Weinstein monofilament wire test absent   bilaterally. Muscle power within normal limits bilaterally.  Nails Thick disfigured discolored nails with subungual debris  from hallux to fifth toes bilaterally. No evidence of bacterial infection or drainage bilaterally. Hyperkeratotic tissue noted bilateral plantar fifth metatarsals.   Orthopedic  No limitations of motion  feet .  No crepitus or effusions noted.  HAV  B/L.  Hammer toes  B/L.  Exostosis IPJ left hallux.  Skin  normotropic skin with no porokeratosis noted bilaterally.  No signs of infections or ulcers noted.   Porokeratosis sub 5th  Left foot.  Onychomycosis  Pain in right toes  Pain in left toes  Consent was obtained for treatment procedures.   Mechanical debridement of nails 1-5  bilaterally performed with a nail nipper.  Filed with dremel without incident. Hyperkeratotic tissue debrided without incident x2 bilateral.   Will get scheduled for DM shoe fitting as well as due for new shoes.   Return office visit   10 weeks       Told patient to return for periodic foot care and evaluation due to potential at risk complications.   Louann Sjogren DPM

## 2024-01-23 ENCOUNTER — Ambulatory Visit: Payer: Medicare Other | Admitting: Podiatry

## 2024-03-19 ENCOUNTER — Encounter (HOSPITAL_BASED_OUTPATIENT_CLINIC_OR_DEPARTMENT_OTHER): Payer: Self-pay | Admitting: Emergency Medicine

## 2024-03-19 ENCOUNTER — Emergency Department (HOSPITAL_BASED_OUTPATIENT_CLINIC_OR_DEPARTMENT_OTHER)

## 2024-03-19 ENCOUNTER — Emergency Department (HOSPITAL_BASED_OUTPATIENT_CLINIC_OR_DEPARTMENT_OTHER)
Admission: EM | Admit: 2024-03-19 | Discharge: 2024-03-19 | Disposition: A | Discharge status: Home / Self Care | Attending: Emergency Medicine | Admitting: Emergency Medicine

## 2024-03-19 ENCOUNTER — Other Ambulatory Visit: Payer: Self-pay

## 2024-03-19 DIAGNOSIS — Z79899 Other long term (current) drug therapy: Secondary | ICD-10-CM | POA: Diagnosis not present

## 2024-03-19 DIAGNOSIS — J449 Chronic obstructive pulmonary disease, unspecified: Secondary | ICD-10-CM | POA: Insufficient documentation

## 2024-03-19 DIAGNOSIS — Z85038 Personal history of other malignant neoplasm of large intestine: Secondary | ICD-10-CM | POA: Diagnosis not present

## 2024-03-19 DIAGNOSIS — S52501A Unspecified fracture of the lower end of right radius, initial encounter for closed fracture: Secondary | ICD-10-CM | POA: Diagnosis not present

## 2024-03-19 DIAGNOSIS — Z7902 Long term (current) use of antithrombotics/antiplatelets: Secondary | ICD-10-CM | POA: Insufficient documentation

## 2024-03-19 DIAGNOSIS — Z794 Long term (current) use of insulin: Secondary | ICD-10-CM | POA: Insufficient documentation

## 2024-03-19 DIAGNOSIS — S2231XA Fracture of one rib, right side, initial encounter for closed fracture: Secondary | ICD-10-CM | POA: Diagnosis not present

## 2024-03-19 DIAGNOSIS — Z85850 Personal history of malignant neoplasm of thyroid: Secondary | ICD-10-CM | POA: Insufficient documentation

## 2024-03-19 DIAGNOSIS — E119 Type 2 diabetes mellitus without complications: Secondary | ICD-10-CM | POA: Diagnosis not present

## 2024-03-19 DIAGNOSIS — M549 Dorsalgia, unspecified: Secondary | ICD-10-CM | POA: Diagnosis not present

## 2024-03-19 DIAGNOSIS — W19XXXA Unspecified fall, initial encounter: Secondary | ICD-10-CM | POA: Diagnosis not present

## 2024-03-19 DIAGNOSIS — S80212A Abrasion, left knee, initial encounter: Secondary | ICD-10-CM | POA: Diagnosis not present

## 2024-03-19 DIAGNOSIS — R519 Headache, unspecified: Secondary | ICD-10-CM | POA: Insufficient documentation

## 2024-03-19 DIAGNOSIS — R109 Unspecified abdominal pain: Secondary | ICD-10-CM | POA: Insufficient documentation

## 2024-03-19 DIAGNOSIS — S62101A Fracture of unspecified carpal bone, right wrist, initial encounter for closed fracture: Secondary | ICD-10-CM

## 2024-03-19 DIAGNOSIS — S6991XA Unspecified injury of right wrist, hand and finger(s), initial encounter: Secondary | ICD-10-CM | POA: Diagnosis present

## 2024-03-19 LAB — CBC WITH DIFFERENTIAL/PLATELET
Abs Immature Granulocytes: 0.05 10*3/uL (ref 0.00–0.07)
Basophils Absolute: 0 10*3/uL (ref 0.0–0.1)
Basophils Relative: 0 %
Eosinophils Absolute: 0 10*3/uL (ref 0.0–0.5)
Eosinophils Relative: 0 %
HCT: 35.8 % — ABNORMAL LOW (ref 39.0–52.0)
Hemoglobin: 12.2 g/dL — ABNORMAL LOW (ref 13.0–17.0)
Immature Granulocytes: 1 %
Lymphocytes Relative: 7 %
Lymphs Abs: 0.8 10*3/uL (ref 0.7–4.0)
MCH: 33.2 pg (ref 26.0–34.0)
MCHC: 34.1 g/dL (ref 30.0–36.0)
MCV: 97.5 fL (ref 80.0–100.0)
Monocytes Absolute: 0.7 10*3/uL (ref 0.1–1.0)
Monocytes Relative: 7 %
Neutro Abs: 8.7 10*3/uL — ABNORMAL HIGH (ref 1.7–7.7)
Neutrophils Relative %: 85 %
Platelets: 168 10*3/uL (ref 150–400)
RBC: 3.67 MIL/uL — ABNORMAL LOW (ref 4.22–5.81)
RDW: 14 % (ref 11.5–15.5)
WBC: 10.2 10*3/uL (ref 4.0–10.5)
nRBC: 0 % (ref 0.0–0.2)

## 2024-03-19 LAB — COMPREHENSIVE METABOLIC PANEL WITH GFR
ALT: 18 U/L (ref 0–44)
AST: 22 U/L (ref 15–41)
Albumin: 3.9 g/dL (ref 3.5–5.0)
Alkaline Phosphatase: 69 U/L (ref 38–126)
Anion gap: 12 (ref 5–15)
BUN: 29 mg/dL — ABNORMAL HIGH (ref 8–23)
CO2: 23 mmol/L (ref 22–32)
Calcium: 9.3 mg/dL (ref 8.9–10.3)
Chloride: 109 mmol/L (ref 98–111)
Creatinine, Ser: 1.72 mg/dL — ABNORMAL HIGH (ref 0.61–1.24)
GFR, Estimated: 37 mL/min — ABNORMAL LOW (ref >60)
Glucose, Bld: 151 mg/dL — ABNORMAL HIGH (ref 70–99)
Potassium: 4.4 mmol/L (ref 3.5–5.1)
Sodium: 144 mmol/L (ref 135–145)
Total Bilirubin: 0.8 mg/dL (ref 0.0–1.2)
Total Protein: 6.3 g/dL — ABNORMAL LOW (ref 6.5–8.1)

## 2024-03-19 MED ORDER — OXYCODONE HCL 5 MG PO TABS: 5.0000 mg | ORAL_TABLET | Freq: Four times a day (QID) | ORAL | 0 refills | Status: DC | PRN

## 2024-03-19 MED ORDER — FENTANYL CITRATE PF 50 MCG/ML IJ SOSY
50.0000 ug | PREFILLED_SYRINGE | Freq: Once | INTRAMUSCULAR | Status: AC
  Administered 2024-03-19: 50 ug via INTRAVENOUS
  Filled 2024-03-19: qty 1

## 2024-03-19 MED ORDER — PROPOFOL 10 MG/ML IV BOLUS
INTRAVENOUS | Status: AC | PRN
  Administered 2024-03-19: 20 mg via INTRAVENOUS
  Administered 2024-03-19: 50 mg via INTRAVENOUS

## 2024-03-19 MED ORDER — PROPOFOL 10 MG/ML IV BOLUS
INTRAVENOUS | Status: AC | PRN
  Administered 2024-03-19: 20 mg via INTRAVENOUS

## 2024-03-19 MED ORDER — PROPOFOL 10 MG/ML IV BOLUS
0.5000 mg/kg | Freq: Once | INTRAVENOUS | Status: DC
  Filled 2024-03-19: qty 20

## 2024-03-19 MED ORDER — IOHEXOL 300 MG/ML  SOLN
80.0000 mL | Freq: Once | INTRAMUSCULAR | Status: AC | PRN
  Administered 2024-03-19: 80 mL via INTRAVENOUS

## 2024-03-19 MED ORDER — SODIUM CHLORIDE 0.9 % IV BOLUS
1000.0000 mL | Freq: Once | INTRAVENOUS | Status: AC
  Administered 2024-03-19: 1000 mL via INTRAVENOUS

## 2024-03-19 NOTE — ED Triage Notes (Signed)
 Pt POV in wheelchair-  pt reports mechanical fall just PTA. Pt takes plavix, denies LOC.    C/o R wrist injury, hit chin, cracked tooth, L flank, L knee, L back pain.   Pt HOH, answers questions appropriately.   Took oxycodone appx 20 min PTA.

## 2024-03-19 NOTE — ED Notes (Signed)
 Respiratory Therapist at [facility]  in room number __7__ during procedure.  Suction with [place] at [facility] set up and ready to use. Ambu bag at [facility] and ready to use.  Patient placed on ETCO2 Nasal Cannula at  2-4 LPM.  Vitals at conclusion of procedure:  ETCO2 __23__ mmHg HR 80 RR 18 SPO2 97%  Patient awake and able to verbalize name.  Moving all extremities on command.

## 2024-03-19 NOTE — ED Notes (Signed)
 Patient is tolerating his [place] lemon lime drink.

## 2024-03-19 NOTE — ED Notes (Signed)
 Patient will be driven home by son, Amada Jupiter.

## 2024-03-19 NOTE — Discharge Instructions (Signed)
 Keep your splint clean and dry.  Do not let the splint get wet.  Follow-up with Dr. Merlyn Lot with hand team.  I prescribed your oxycodone for breakthrough pain but otherwise I recommend Tylenol.  Roxicodone is a narcotic pain medicine.  Do not mix with alcohol drugs or dangerous activities including driving.

## 2024-03-19 NOTE — ED Provider Notes (Signed)
 [place] [facility] Provider Note   CSN: [id number] Arrival date & time: 03/19/24  1439     History  Chief Complaint  Patient presents with   David [name] is a 89 y.o. male.  Patient here after mechanical fall prior to arrival.  Injury to his right wrist, hit his face injury to the has pain to his left knee left flank left back left abdomen took oxycodone 20 minutes prior to arrival.  He takes Plavix.  History of COPD AAA colon cancer thyroid cancer.  History of diabetes.  Denies any weakness numbness tingling.  He has pain swelling deformity to the right wrist.  He was ambulatory after the fall.  Denies loss conscious.  No neck pain.  The history is provided by the patient.       Home Medications Prior to Admission medications   Medication Sig Start Date End Date Taking? Authorizing Provider  oxyCODONE (ROXICODONE) 5 MG immediate release tablet Take 1 tablet (5 mg total) by mouth every 6 (six) hours as needed for up to 15 doses. 03/19/24  Yes Renne Platts, DO  acetaminophen (TYLENOL) 500 MG tablet Take 1-2 tablets (500-1,000 mg total) by mouth every 8 (eight) hours. 07/26/23   Gaynelle Adu, MD  allopurinol (ZYLOPRIM) 100 MG tablet Take 150 mg by mouth daily.    [provider]  BD PEN NEEDLE NANO 2ND GEN 32G X 4 MM MISC USE WITH INSULIN PENS TO GIVE 4 INJECTIONS DAILY 02/27/22   [provider]  clopidogrel (PLAVIX) 75 MG tablet Take 75 mg by mouth at bedtime.     [provider]  COMBIVENT RESPIMAT 20-100 MCG/ACT AERS respimat Inhale 1 puff into the lungs in the morning and at bedtime. 01/06/20   [provider]  diltiazem (CARDIZEM CD) 120 MG 24 hr capsule TAKE 1 CAPSULE BY MOUTH EVERY DAY 07/08/23   Chandrasekhar, Mahesh A, MD  famotidine (PEPCID) 20 MG tablet Take 1 tablet (20 mg total) by mouth at bedtime. 12/29/21   Martina Sinner, MD  Fluticasone-Salmeterol (ADVAIR) 250-50 MCG/DOSE AEPB  Inhale 1 puff into the lungs every 12 (twelve) hours.    [provider]  HUMALOG KWIKPEN 100 UNIT/ML KwikPen Inject 3 Units into the skin See admin instructions. 3 units in the morning and  units late afternoon/supper time 02/29/20   [provider]  ipratropium-albuterol (DUONEB) 0.5-2.5 (3) MG/3ML SOLN Take 3 mLs by nebulization 2 (two) times daily. 05/31/21   [provider]  ketoconazole (NIZORAL) 2 % cream Apply to bottom of feet twice daily Patient taking differently: Apply 1 Application topically 2 (two) times daily as needed for irritation (Apply to bottom of feet). 10/18/22   Marlowe Aschoff P, DPM  LEVEMIR FLEXTOUCH 100 UNIT/ML Pen Inject 5 Units into the skin at bedtime. 04/05/15   [provider]  levothyroxine (SYNTHROID) 100 MCG tablet Take 100 mcg by mouth daily before breakfast.    [provider]  lipase/protease/amylase (CREON) 36000 UNITS CPEP capsule Take 1-2 capsules by mouth See admin instructions. 2 capsule with each meal, 1 capsule with each snack 10/01/22   [provider]  LORazepam (ATIVAN) 0.5 MG tablet Take 0.25-0.5 mg by mouth daily as needed for anxiety. 02/10/21   [provider]  nitroGLYCERIN (NITROSTAT) 0.4 MG SL tablet Place 0.4 mg under the tongue every 5 (five) minutes as needed for chest pain.    [provider]  Lum Babe  test strip SMARTSIG:Via Meter 4 Times Daily PRN 05/23/22   [provider]  rosuvastatin (CRESTOR) 10 MG tablet Take 10 mg by mouth at bedtime. 10/18/19   [provider]  valsartan-hydrochlorothiazide (DIOVAN-HCT) 80-12.5 MG tablet Take 1 tablet by mouth daily. 09/16/19   [provider]  Vitamin D, Ergocalciferol, (DRISDOL) 50000 UNITS CAPS Take 50,000 Units by mouth every Monday.     [provider]      Allergies    Azithromycin and Doxycycline    Review of Systems   Review of Systems  Physical Exam Updated Vital Signs BP (!)  172/83 (BP Location: Left Arm)   Pulse 79   Temp 98.3 F (36.8 C) (Oral)   Resp 15   Ht 6' (1.829 m)   Wt 73.9 kg   SpO2 98%   BMI 22.11 kg/m  Physical Exam Vitals and nursing note reviewed.  Constitutional:      General: He is not in acute distress.    Appearance: He is well-developed. He is not ill-appearing.  HENT:     Head: Normocephalic and atraumatic.     Nose: Nose normal.     Mouth/Throat:     Mouth: Mucous membranes are moist.  Eyes:     Extraocular Movements: Extraocular movements intact.     Conjunctiva/sclera: Conjunctivae normal.     Pupils: Pupils are equal, round, and reactive to light.  Cardiovascular:     Rate and Rhythm: Normal rate and regular rhythm.     Pulses: Normal pulses.     Heart sounds: No murmur heard. Pulmonary:     Effort: Pulmonary effort is normal. No respiratory distress.     Breath sounds: Normal breath sounds.  Abdominal:     Palpations: Abdomen is soft.     Tenderness: There is no abdominal tenderness.  Musculoskeletal:        General: Swelling, tenderness and deformity present.     Cervical back: Normal range of motion and neck supple. No tenderness.     Comments: Swelling and bruising deformity to the right wrist, tenderness to the left knee with abrasion  Skin:    General: Skin is warm and dry.     Capillary Refill: Capillary refill takes less than 2 seconds.  Neurological:     General: No focal deficit present.     Mental Status: He is alert and oriented to person, place, and time.     Cranial Nerves: No cranial nerve deficit.     Sensory: No sensory deficit.     Motor: No weakness.     Coordination: Coordination normal.     Comments: Normal strength normal sensation normal speech  Psychiatric:        Mood and Affect: Mood normal.     ED Results / Procedures / Treatments   Labs (all labs ordered are listed, but only abnormal results are displayed) Labs Reviewed  CBC WITH DIFFERENTIAL/PLATELET - Abnormal; Notable for  the following components:      Result Value   RBC 3.67 (*)    Hemoglobin 12.2 (*)    HCT 35.8 (*)    Neutro Abs 8.7 (*)    All other components within normal limits  COMPREHENSIVE METABOLIC PANEL WITH GFR - Abnormal; Notable for the following components:   Glucose, Bld 151 (*)    BUN 29 (*)    Creatinine, Ser 1.72 (*)    Total Protein 6.3 (*)    GFR, Estimated 37 (*)    All other  components within normal limits    EKG EKG Interpretation Date/Time:  Thursday March 19 2024 15:18:10 EDT Ventricular Rate:  63 PR Interval:  72 QRS Duration:  141 QT Interval:  452 QTC Calculation: 463 R Axis:   63  Text Interpretation: Sinus rhythm pvcs Confirmed by Virgina Norfolk [phone]) on 03/19/2024 3:29:39 PM  Radiology DG Wrist 2 Views Right Result Date: 03/19/2024 CLINICAL DATA:  Postreduction of right wrist. EXAM: RIGHT WRIST - 2 VIEW COMPARISON:  03/19/2024. FINDINGS: There is redemonstration of a comminuted impaction fracture of the radius with intra-articular extension and mild residual dorsal displacement. The remaining bony structures are intact. Soft tissue swelling is present about the wrist forearm. IMPRESSION: Comminuted fracture of the distal radius with dorsal displacement, slightly improved from the prior exam. Electronically Signed   By: Thornell Sartorius M.D.   On: 03/19/2024 20:12   CT CHEST ABDOMEN PELVIS W CONTRAST Result Date: 03/19/2024 CLINICAL DATA:  Fall trauma EXAM: CT CHEST, ABDOMEN, AND PELVIS WITH CONTRAST TECHNIQUE: Multidetector CT imaging of the chest, abdomen and pelvis was performed following the standard protocol during bolus administration of intravenous contrast. RADIATION DOSE REDUCTION: This exam was performed according to the departmental dose-optimization program which includes automated exposure control, adjustment of the mA and/or kV according to patient size and/or use of iterative reconstruction technique. CONTRAST:  80mL OMNIPAQUE IOHEXOL 300 MG/ML  SOLN  COMPARISON:  CT 10/26/2023, 11/10/2021, 09/21/2014 FINDINGS: CT CHEST FINDINGS Cardiovascular: Advanced aortic atherosclerosis. Coronary vascular calcification. Mild cardiomegaly. No pericardial effusion. Diffuse aneurysmal dilatation of the descending thoracic aorta measuring 4.2 cm maximum diameter. Mediastinum/Nodes: Patent trachea. No thyroid mass. No suspicious lymph nodes. Calcified right hilar lymph nodes. Esophagus is normal Lungs/Pleura: Emphysema. No acute airspace disease, pleural effusion or pneumothorax. Redemonstrated multiple calcified and noncalcified lung nodules, stable. Largest is seen in the right upper lobe on series 302, image 36 and measures 6 mm. No imaging follow-up is recommended. Musculoskeletal: Sternum appears intact. Acute mildly displaced right eighth anterolateral rib fracture. CT ABDOMEN PELVIS FINDINGS Hepatobiliary: Gallstones. No focal hepatic abnormality or biliary dilatation. Pancreas: Unremarkable. No pancreatic ductal dilatation or surrounding inflammatory changes. Spleen: Normal in size without focal abnormality. Adrenals/Urinary Tract: Adrenal glands are normal. Kidneys show no hydronephrosis. Atrophic kidneys. Cyst upper pole left kidney for which no imaging follow-up is recommended. The bladder is unremarkable Stomach/Bowel: The stomach is nonenlarged. No dilated small bowel. No acute bowel wall thickening. Vascular/Lymphatic: Status post repair of infrarenal abdominal aortic aneurysm. Advanced aortic vascular calcification. No suspicious lymph nodes Reproductive: Enlarged prostate Other: Negative for pelvic effusion or free air. Soft tissue thickening in the left groin, mildly decreased compared to prior CT and could reflect postoperative change Musculoskeletal: No acute osseous abnormality. Chronic bilateral pars defect at L5. IMPRESSION: 1. Acute mildly displaced right eighth anterolateral rib fracture. No pneumothorax or pleural effusion. 2. No CT evidence for acute  intra-abdominal or pelvic abnormality. 3. Status post repair of infrarenal abdominal aortic aneurysm. Diffuse aneurysmal dilatation of the descending thoracic aorta measuring up to 4.2 cm maximum diameter. 4. Cholelithiasis. 5. Emphysema. 6. Aortic atherosclerosis. Electronically Signed   By: Jasmine Pang M.D.   On: 03/19/2024 17:43   CT T-SPINE NO CHARGE Result Date: 03/19/2024 CLINICAL DATA:  Fall with back pain. EXAM: CT THORACIC AND LUMBAR SPINE WITHOUT CONTRAST TECHNIQUE: Multidetector CT imaging of the thoracic and lumbar spine was performed without contrast. Multiplanar CT image reconstructions were also generated. RADIATION DOSE REDUCTION: This exam was performed according to the departmental dose-optimization  program which includes automated exposure control, adjustment of the mA and/or kV according to patient size and/or use of iterative reconstruction technique. COMPARISON:  CT abdomen pelvis 10/26/2023, CT chest 11/10/2021 FINDINGS: CT THORACIC SPINE FINDINGS Alignment: Thoracic kyphosis is maintained.  No listhesis. Vertebrae: No compression fracture or displaced fracture in the thoracic spine. Irregularity of the T2 through T4 superior endplates is unchanged from prior CT. Schmorl's nodes at multiple levels. Additional chronic irregularity of the T11 superior endplate likely related to Schmorl's node. No suspicious osseous lesion. Paraspinal and other soft tissues: The paraspinal musculature is unremarkable. Emphysema. Atherosclerosis of the thoracic aorta and coronary arteries. Disc levels: Mild intervertebral disc space narrowing at T8-9. Degenerative endplate changes and endplate osteophytes at multiple levels. No CT evidence of large disc herniation or high-grade osseous spinal canal stenosis. Facet arthrosis at multiple levels. There is moderate foraminal narrowing on the right at T1-2. Additional mild to moderate foraminal narrowing on the left at T9-10. CT LUMBAR SPINE FINDINGS Segmentation:  5 lumbar type vertebrae. Alignment: Lumbar lordosis is maintained. Trace retrolisthesis of L4 on L5. Vertebrae: No compression fracture or displaced fracture in the lumbar spine. No suspicious osseous lesion. Degenerative changes and partial ankylosis of the bilateral sacroiliac joints. Bilateral pars defects at L5. Paraspinal and other soft tissues: The paraspinal musculature is unremarkable. Lobulated contour of the kidneys. Prominent cyst in the upper pole of the left kidney. Calcified and noncalcified atherosclerosis along the abdominal aorta. Sequelae of graft repair of the infrarenal abdominal aorta. Disc levels: Mild disc space narrowing at L4-5 and mild-to-moderate disc space narrowing at L5-S1. Small disc bulges at multiple levels. Disc bulge and facet arthrosis at L2-3 results in mild spinal canal stenosis. Retrolisthesis, disc bulge, and facet arthrosis at L4-5 resulting in mild spinal canal stenosis. There is no high-grade osseous foraminal stenosis. IMPRESSION: CT THORACIC SPINE IMPRESSION No acute fracture or traumatic malalignment of the thoracic spine. Degenerative changes as above. Moderate foraminal stenosis on the right at T1-2. CT LUMBAR SPINE IMPRESSION No acute fracture or traumatic malalignment of the lumbar spine. Bilateral pars defects at L5. Mild degenerative changes as above. Aortic Atherosclerosis (ICD10-I70.0) and Emphysema (ICD10-J43.9). Electronically Signed   By: Emily Filbert M.D.   On: 03/19/2024 17:39   CT L-SPINE NO CHARGE Result Date: 03/19/2024 CLINICAL DATA:  Fall with back pain. EXAM: CT THORACIC AND LUMBAR SPINE WITHOUT CONTRAST TECHNIQUE: Multidetector CT imaging of the thoracic and lumbar spine was performed without contrast. Multiplanar CT image reconstructions were also generated. RADIATION DOSE REDUCTION: This exam was performed according to the departmental dose-optimization program which includes automated exposure control, adjustment of the mA and/or kV according  to patient size and/or use of iterative reconstruction technique. COMPARISON:  CT abdomen pelvis 10/26/2023, CT chest 11/10/2021 FINDINGS: CT THORACIC SPINE FINDINGS Alignment: Thoracic kyphosis is maintained.  No listhesis. Vertebrae: No compression fracture or displaced fracture in the thoracic spine. Irregularity of the T2 through T4 superior endplates is unchanged from prior CT. Schmorl's nodes at multiple levels. Additional chronic irregularity of the T11 superior endplate likely related to Schmorl's node. No suspicious osseous lesion. Paraspinal and other soft tissues: The paraspinal musculature is unremarkable. Emphysema. Atherosclerosis of the thoracic aorta and coronary arteries. Disc levels: Mild intervertebral disc space narrowing at T8-9. Degenerative endplate changes and endplate osteophytes at multiple levels. No CT evidence of large disc herniation or high-grade osseous spinal canal stenosis. Facet arthrosis at multiple levels. There is moderate foraminal narrowing on the right at T1-2. Additional mild  to moderate foraminal narrowing on the left at T9-10. CT LUMBAR SPINE FINDINGS Segmentation: 5 lumbar type vertebrae. Alignment: Lumbar lordosis is maintained. Trace retrolisthesis of L4 on L5. Vertebrae: No compression fracture or displaced fracture in the lumbar spine. No suspicious osseous lesion. Degenerative changes and partial ankylosis of the bilateral sacroiliac joints. Bilateral pars defects at L5. Paraspinal and other soft tissues: The paraspinal musculature is unremarkable. Lobulated contour of the kidneys. Prominent cyst in the upper pole of the left kidney. Calcified and noncalcified atherosclerosis along the abdominal aorta. Sequelae of graft repair of the infrarenal abdominal aorta. Disc levels: Mild disc space narrowing at L4-5 and mild-to-moderate disc space narrowing at L5-S1. Small disc bulges at multiple levels. Disc bulge and facet arthrosis at L2-3 results in mild spinal canal  stenosis. Retrolisthesis, disc bulge, and facet arthrosis at L4-5 resulting in mild spinal canal stenosis. There is no high-grade osseous foraminal stenosis. IMPRESSION: CT THORACIC SPINE IMPRESSION No acute fracture or traumatic malalignment of the thoracic spine. Degenerative changes as above. Moderate foraminal stenosis on the right at T1-2. CT LUMBAR SPINE IMPRESSION No acute fracture or traumatic malalignment of the lumbar spine. Bilateral pars defects at L5. Mild degenerative changes as above. Aortic Atherosclerosis (ICD10-I70.0) and Emphysema (ICD10-J43.9). Electronically Signed   By: Emily Filbert M.D.   On: 03/19/2024 17:39   CT Head Wo Contrast Result Date: 03/19/2024 CLINICAL DATA:  Trauma, fall EXAM: CT HEAD WITHOUT CONTRAST CT MAXILLOFACIAL WITHOUT CONTRAST CT CERVICAL SPINE WITHOUT CONTRAST TECHNIQUE: Multidetector CT imaging of the head, cervical spine, and maxillofacial structures were performed using the standard protocol without intravenous contrast. Multiplanar CT image reconstructions of the cervical spine and maxillofacial structures were also generated. RADIATION DOSE REDUCTION: This exam was performed according to the departmental dose-optimization program which includes automated exposure control, adjustment of the mA and/or kV according to patient size and/or use of iterative reconstruction technique. COMPARISON:  None Available. FINDINGS: CT HEAD FINDINGS Brain: No acute intracranial hemorrhage. No CT evidence of acute infarct. Nonspecific hypoattenuation in the periventricular and subcortical white matter favored to reflect chronic microvascular ischemic changes. Remote lacunar infarct in the right basal ganglia. No edema, mass effect, or midline shift. The basilar cisterns are patent. Ventricles: Prominence of the ventricles suggesting underlying parenchymal volume loss. Vascular: Atherosclerotic calcifications of the carotid siphons and intracranial vertebral arteries. No hyperdense  vessel. Skull: No acute or aggressive finding. Orbits: Orbits are symmetric. Sinuses: The visualized paranasal sinuses are clear. Other: Mastoid air cells are clear. CT MAXILLOFACIAL FINDINGS Osseous: Maxilla: Intact Pterygoid Plates: Intact Zygomatic Arch: Intact Orbits: Intact Ethmoid: Intact Sphenoid: Intact Frontal:Intact Mandible: Intact. No condylar dislocation. Nasal: Mild irregularity of the left nasal bone with subtle medial apex angulation. Nasal Septum: Intact.  Leftward deviation of the nasal septum. Orbits: Globes are intact. Postsurgical changes of bilateral lens replacement. The extraocular muscles and optic nerve sheath complexes are unremarkable. Normal appearance of the retro bulbar fat. No significant preseptal soft tissue swelling. Sinuses: Minimal mucosal thickening in the alveolar recess of the left maxillary sinus. No air-fluid levels. Soft tissues: Negative. CT CERVICAL SPINE FINDINGS Alignment: Straightening of the normal cervical lordosis. Trace retrolisthesis of C5 on C6. No facet subluxation or dislocation. Skull base and vertebrae: No compression fracture or displaced fracture in the cervical spine. 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 No suspicious osseous lesion. Soft tissues and spinal canal: No prevertebral fluid or swelling. No visible canal hematoma. Postsurgical changes of thyroidectomy. Atherosclerosis of the  carotid bifurcations. Disc levels: Intervertebral disc space narrowing throughout the cervical spine most pronounced at C5-6. Disc osteophyte complex at multiple levels. Mild spinal canal stenosis at C3-4. Moderate spinal canal stenosis at C4-5. Disc osteophyte complex at C5-6 eccentric to the right resulting in moderate spinal canal narrowing. There is severe foraminal narrowing on the right at C5-6. Additional moderate to severe foraminal narrowing on the left at C3-4. Upper chest: Emphysema. Other: None. IMPRESSION: No CT evidence of  acute intracranial abnormality. Irregularity of the left nasal bone with medial apex angulation, age indeterminate. Recommend correlation with tenderness on exam. Otherwise no maxillofacial fracture appreciated. No acute fracture or traumatic malalignment of the cervical spine. Additional chronic and degenerative changes as above. Emphysema (ICD10-J43.9). Electronically Signed   By: Emily Filbert M.D.   On: 03/19/2024 17:16   CT Cervical Spine Wo Contrast Result Date: 03/19/2024 CLINICAL DATA:  Trauma, fall EXAM: CT HEAD WITHOUT CONTRAST CT MAXILLOFACIAL WITHOUT CONTRAST CT CERVICAL SPINE WITHOUT CONTRAST TECHNIQUE: Multidetector CT imaging of the head, cervical spine, and maxillofacial structures were performed using the standard protocol without intravenous contrast. Multiplanar CT image reconstructions of the cervical spine and maxillofacial structures were also generated. RADIATION DOSE REDUCTION: This exam was performed according to the departmental dose-optimization program which includes automated exposure control, adjustment of the mA and/or kV according to patient size and/or use of iterative reconstruction technique. COMPARISON:  None Available. FINDINGS: CT HEAD FINDINGS Brain: No acute intracranial hemorrhage. No CT evidence of acute infarct. Nonspecific hypoattenuation in the periventricular and subcortical white matter favored to reflect chronic microvascular ischemic changes. Remote lacunar infarct in the right basal ganglia. No edema, mass effect, or midline shift. The basilar cisterns are patent. Ventricles: Prominence of the ventricles suggesting underlying parenchymal volume loss. Vascular: Atherosclerotic calcifications of the carotid siphons and intracranial vertebral arteries. No hyperdense vessel. Skull: No acute or aggressive finding. Orbits: Orbits are symmetric. Sinuses: The visualized paranasal sinuses are clear. Other: Mastoid air cells are clear. CT MAXILLOFACIAL FINDINGS Osseous:  Maxilla: Intact Pterygoid Plates: Intact Zygomatic Arch: Intact Orbits: Intact Ethmoid: Intact Sphenoid: Intact Frontal:Intact Mandible: Intact. No condylar dislocation. Nasal: Mild irregularity of the left nasal bone with subtle medial apex angulation. Nasal Septum: Intact.  Leftward deviation of the nasal septum. Orbits: Globes are intact. Postsurgical changes of bilateral lens replacement. The extraocular muscles and optic nerve sheath complexes are unremarkable. Normal appearance of the retro bulbar fat. No significant preseptal soft tissue swelling. Sinuses: Minimal mucosal thickening in the alveolar recess of the left maxillary sinus. No air-fluid levels. Soft tissues: Negative. CT CERVICAL SPINE FINDINGS Alignment: Straightening of the normal cervical lordosis. Trace retrolisthesis of C5 on C6. No facet subluxation or dislocation. Skull base and vertebrae: No compression fracture or displaced fracture in the cervical spine. 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 No suspicious osseous lesion. Soft tissues and spinal canal: No prevertebral fluid or swelling. No visible canal hematoma. Postsurgical changes of thyroidectomy. Atherosclerosis of the carotid bifurcations. Disc levels: Intervertebral disc space narrowing throughout the cervical spine most pronounced at C5-6. Disc osteophyte complex at multiple levels. Mild spinal canal stenosis at C3-4. Moderate spinal canal stenosis at C4-5. Disc osteophyte complex at C5-6 eccentric to the right resulting in moderate spinal canal narrowing. There is severe foraminal narrowing on the right at C5-6. Additional moderate to severe foraminal narrowing on the left at C3-4. Upper chest: Emphysema. Other: None. IMPRESSION: No CT evidence of acute intracranial abnormality. Irregularity of the left  nasal bone with medial apex angulation, age indeterminate. Recommend correlation with tenderness on exam. Otherwise no maxillofacial fracture  appreciated. No acute fracture or traumatic malalignment of the cervical spine. Additional chronic and degenerative changes as above. Emphysema (ICD10-J43.9). Electronically Signed   By: Emily Filbert M.D.   On: 03/19/2024 17:16   CT Maxillofacial Wo Contrast Result Date: 03/19/2024 CLINICAL DATA:  Trauma, fall EXAM: CT HEAD WITHOUT CONTRAST CT MAXILLOFACIAL WITHOUT CONTRAST CT CERVICAL SPINE WITHOUT CONTRAST TECHNIQUE: Multidetector CT imaging of the head, cervical spine, and maxillofacial structures were performed using the standard protocol without intravenous contrast. Multiplanar CT image reconstructions of the cervical spine and maxillofacial structures were also generated. RADIATION DOSE REDUCTION: This exam was performed according to the departmental dose-optimization program which includes automated exposure control, adjustment of the mA and/or kV according to patient size and/or use of iterative reconstruction technique. COMPARISON:  None Available. FINDINGS: CT HEAD FINDINGS Brain: No acute intracranial hemorrhage. No CT evidence of acute infarct. Nonspecific hypoattenuation in the periventricular and subcortical white matter favored to reflect chronic microvascular ischemic changes. Remote lacunar infarct in the right basal ganglia. No edema, mass effect, or midline shift. The basilar cisterns are patent. Ventricles: Prominence of the ventricles suggesting underlying parenchymal volume loss. Vascular: Atherosclerotic calcifications of the carotid siphons and intracranial vertebral arteries. No hyperdense vessel. Skull: No acute or aggressive finding. Orbits: Orbits are symmetric. Sinuses: The visualized paranasal sinuses are clear. Other: Mastoid air cells are clear. CT MAXILLOFACIAL FINDINGS Osseous: Maxilla: Intact Pterygoid Plates: Intact Zygomatic Arch: Intact Orbits: Intact Ethmoid: Intact Sphenoid: Intact Frontal:Intact Mandible: Intact. No condylar dislocation. Nasal: Mild irregularity of the  left nasal bone with subtle medial apex angulation. Nasal Septum: Intact.  Leftward deviation of the nasal septum. Orbits: Globes are intact. Postsurgical changes of bilateral lens replacement. The extraocular muscles and optic nerve sheath complexes are unremarkable. Normal appearance of the retro bulbar fat. No significant preseptal soft tissue swelling. Sinuses: Minimal mucosal thickening in the alveolar recess of the left maxillary sinus. No air-fluid levels. Soft tissues: Negative. CT CERVICAL SPINE FINDINGS Alignment: Straightening of the normal cervical lordosis. Trace retrolisthesis of C5 on C6. No facet subluxation or dislocation. Skull base and vertebrae: No compression fracture or displaced fracture in the cervical spine. 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 No suspicious osseous lesion. Soft tissues and spinal canal: No prevertebral fluid or swelling. No visible canal hematoma. Postsurgical changes of thyroidectomy. Atherosclerosis of the carotid bifurcations. Disc levels: Intervertebral disc space narrowing throughout the cervical spine most pronounced at C5-6. Disc osteophyte complex at multiple levels. Mild spinal canal stenosis at C3-4. Moderate spinal canal stenosis at C4-5. Disc osteophyte complex at C5-6 eccentric to the right resulting in moderate spinal canal narrowing. There is severe foraminal narrowing on the right at C5-6. Additional moderate to severe foraminal narrowing on the left at C3-4. Upper chest: Emphysema. Other: None. IMPRESSION: No CT evidence of acute intracranial abnormality. Irregularity of the left nasal bone with medial apex angulation, age indeterminate. Recommend correlation with tenderness on exam. Otherwise no maxillofacial fracture appreciated. No acute fracture or traumatic malalignment of the cervical spine. Additional chronic and degenerative changes as above. Emphysema (ICD10-J43.9). Electronically Signed   By: Emily Filbert  M.D.   On: 03/19/2024 17:16   DG Knee Complete 4 Views Left Result Date: 03/19/2024 CLINICAL DATA:  Fall on concrete today. Obvious right wrist deformity motor range of motion. Left knee scrape. EXAM: LEFT KNEE - COMPLETE 4+  VIEW COMPARISON:  None Available. FINDINGS: There is diffuse decreased bone mineralization. Moderate medial compartment joint space narrowing. Mild superior and inferior patellar degenerative spurring with mild patellofemoral joint space narrowing. No knee joint effusion. No acute fracture is seen. No dislocation. 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 Electronically Signed   By: Neita Garnet M.D.   On: 03/19/2024 16:21   DG Wrist Complete Right Result Date: 03/19/2024 CLINICAL DATA:  Right wrist pain. EXAM: RIGHT WRIST - COMPLETE 3+ VIEW COMPARISON:  None Available. FINDINGS: Dorsally angulated fracture of the distal radius. There is resulting positive ulnar variance. No dislocation. Soft tissue swelling of the wrist. No radiopaque foreign object or soft tissue gas. IMPRESSION: Dorsally angulated fracture of the distal radius. Electronically Signed   By: Elgie Collard M.D.   On: 03/19/2024 16:20    Procedures .Sedation  Date/Time: 03/19/2024 4:16 PM  Performed by: Virgina Norfolk, DO Authorized by: Virgina Norfolk, DO   Consent:    Consent obtained:  Verbal and written   Consent given by:  Patient   Risks discussed:  Allergic reaction, dysrhythmia, inadequate sedation, nausea, vomiting, respiratory compromise necessitating ventilatory assistance and intubation, prolonged sedation necessitating reversal and prolonged hypoxia resulting in organ damage   Alternatives discussed:  Analgesia without sedation Universal protocol:    Procedure explained and questions answered to patient or proxy's satisfaction: yes     Relevant documents present and verified: yes     Test results available: yes     Imaging studies available:  yes     Immediately prior to procedure, a time out was called: yes     Patient identity confirmed:  Arm band and verbally with patient Indications:    Procedure performed:  Fracture reduction   Procedure necessitating sedation performed by:  Physician performing sedation Pre-sedation assessment:    Time since last food or drink:  5 hours   ASA classification: class 2 - patient with mild systemic disease     Mouth opening:  3 or more finger widths   Mallampati score:  I - soft palate, uvula, fauces, pillars visible   Neck mobility: normal     Pre-sedation assessments completed and reviewed: airway patency, cardiovascular function, hydration status, mental status, nausea/vomiting, pain level, respiratory function and temperature   A pre-sedation assessment was completed prior to the start of the procedure Immediate pre-procedure details:    Reassessment: Patient reassessed immediately prior to procedure     Reviewed: vital signs, relevant labs/tests and NPO status     Verified: bag valve mask available, emergency equipment available, intubation equipment available, IV patency confirmed, oxygen available and reversal medications available   Procedure details (see MAR for exact dosages):    Sedation:  Propofol   Intended level of sedation: deep and moderate (conscious sedation)   Intra-procedure monitoring:  Blood pressure monitoring, cardiac monitor, continuous capnometry, continuous pulse oximetry and frequent vital sign checks   Intra-procedure events: none     Total Provider sedation time (minutes):  25 Post-procedure details:   A post-sedation assessment was completed following the completion of the procedure.   Attendance: Constant attendance by certified staff until patient recovered     Recovery: Patient returned to pre-procedure baseline     Post-sedation assessments completed and reviewed: airway patency, cardiovascular function, hydration status, mental status, nausea/vomiting,  pain level, respiratory function and temperature     Patient is stable for discharge or admission: yes     Procedure completion:  Tolerated well, no  immediate complications .Ortho Injury Treatment  Date/Time: 03/19/2024 6:51 PM  Performed by: Virgina Norfolk, DO Authorized by: Virgina Norfolk, DO   Consent:    Consent obtained:  Written   Consent given by:  Patient   Risks discussed:  Fracture, irreducible dislocation, recurrent dislocation, nerve damage, restricted joint movement and stiffness   Alternatives discussed:  No treatmentInjury location: wrist Location details: right wrist Injury type: fracture Fracture type: distal radius Pre-procedure neurovascular assessment: neurovascularly intact Pre-procedure distal perfusion: normal Pre-procedure neurological function: normal Pre-procedure range of motion: reduced  Patient sedated: Yes. Refer to sedation procedure documentation for details of sedation. Manipulation performed: yes Skin traction used: yes Skeletal traction used: yes Reduction successful: yes X-ray confirmed reduction: yes Immobilization: splint Splint type: sugar tong Splint Applied by: ED Provider and Ortho Tech Supplies used: Ortho-Glass Post-procedure neurovascular assessment: post-procedure neurovascularly intact Post-procedure distal perfusion: normal Post-procedure neurological function: normal Post-procedure range of motion comment: splint       Medications Ordered in ED Medications  propofol (DIPRIVAN) 10 mg/mL bolus/IV push 37 mg (37 mg Intravenous Not Given 03/19/24 1859)  fentaNYL (SUBLIMAZE) injection 50 mcg (50 mcg Intravenous Given 03/19/24 1524)  iohexol (OMNIPAQUE) 300 MG/ML solution 80 mL (80 mLs Intravenous Contrast Given 03/19/24 1641)  fentaNYL (SUBLIMAZE) injection 50 mcg (50 mcg Intravenous Given 03/19/24 1657)  sodium chloride 0.9 % bolus 1,000 mL (0 mLs Intravenous Stopped 03/19/24 1917)  propofol (DIPRIVAN) 10 mg/mL bolus/IV push (20  mg Intravenous Given 03/19/24 1835)  propofol (DIPRIVAN) 10 mg/mL bolus/IV push (20 mg Intravenous Given 03/19/24 1838)    ED Course/ Medical Decision Making/ A&P                                 Medical Decision Making Amount and/or Complexity of Data Reviewed Labs: ordered. Radiology: ordered.  Risk Prescription drug management.   Blair Heys is here after mechanical fall.  Pain to his chin, right wrist, left knee, abdomen, left flank, left side of ribs.  He is on Plavix.  History of CKD.  Ultimately is having pain mostly over the left flank left spleen left ribs deformity to the right wrist.  He is neurovascular neurologically intact.  No midline spinal pain.  Will get trauma images CT head neck face chest abdomen pelvis x-rays of the left knee right wrist.  Anticipate that he will need reduction of wrist given deformity.  Per my review and interpretation of x-rays she does have distal wrist fracture with angulation.  We discussed sedation with propofol we will go ahead with sedation and reduction after CT reports have resulted.  CT reports per radiology shows 1 rib fracture on the right but otherwise no injuries.  Patient tolerated sedation and reduction well with propofol.  He had repeat x-ray that showed improved alignment of distal radius fracture.  Neurovascularly neuromuscular intact before and after reduction.  Patient to follow-up with Dr. Merlyn Lot.  I made Dr. Merlyn Lot aware of the patient on the phone.  Will write him for oxycodone for breakthrough pain.  He is ambulatory in the ED without any issues.  Discharge.  This chart was dictated using voice recognition software.  Despite best efforts to proofread,  errors can occur which can change the documentation meaning.         Final Clinical Impression(s) / ED Diagnoses Final diagnoses:  Closed fracture of right wrist, initial encounter  Closed fracture of one rib of right  side, initial encounter    Rx / DC Orders ED  Discharge Orders          Ordered    oxyCODONE (ROXICODONE) 5 MG immediate release tablet  Every 6 hours PRN        03/19/24 2006              Virgina Norfolk, DO 03/19/24 2039

## 2024-03-19 NOTE — ED Notes (Signed)
Patient ambulated to the bathroom with minimal assist

## 2024-05-11 ENCOUNTER — Telehealth: Payer: Self-pay

## 2024-05-11 NOTE — Telephone Encounter (Signed)
 LOV 06/2022 - are there any acute slots on the schedule for providers this week or next   If not will need to go urgent care or ER if not improving from PCP visit .  Please contact office for sooner follow up if symptoms do not improve or worsen or seek emergency care   Can set up  with Dr. Diania Fortes or another provider for the first available

## 2024-05-11 NOTE — Telephone Encounter (Signed)
 I have not seen patient before, Sending to Tammy who is provider of the day  If we do not have any acute visits he should go to UC or ED if having acute flare up of his symptoms

## 2024-05-11 NOTE — Telephone Encounter (Signed)
 David [name] w/ [facility] calling b/c she said she called Friday and was told she would get a call back. Pt has been scheduled for this Wed, 5/20 w/ Dr Marygrace Snellen. Spoke w/ office and confirmed this is OK.  Lori at [facility] was happy about this apt.

## 2024-05-11 NOTE — Telephone Encounter (Signed)
 Copied from CRM [id number]. Topic: Appointments - Scheduling Inquiry for Clinic >> May 08, 2024  3:24 PM Justina Oman C wrote: Reason for CRM: Nellie Banas from [facility] [phone] states patient needs to be seen as soon as possible for exacerbation COPD, was seen in the office today and they will fax office notes. Informed Nellie Banas, next available for Dr. Diania Fortes is 08/13/24 and APP are in August 2025; the office is closed and will send a message to the nurse for advisement. Nellie Banas will not be in the office on Monday, please ask for Dr. Verdel Gitelman. Please advise and call back.  Please advise Beth and Dr. Diania Fortes

## 2024-05-13 ENCOUNTER — Ambulatory Visit (INDEPENDENT_AMBULATORY_CARE_PROVIDER_SITE_OTHER): Admitting: Pulmonary Disease

## 2024-05-13 ENCOUNTER — Encounter: Payer: Self-pay | Admitting: Pulmonary Disease

## 2024-05-13 VITALS — BP 122/52 | HR 76 | Ht 72.0 in | Wt 156.8 lb

## 2024-05-13 DIAGNOSIS — R052 Subacute cough: Secondary | ICD-10-CM

## 2024-05-13 MED ORDER — PREDNISONE 20 MG PO TABS: 40.0000 mg | ORAL_TABLET | Freq: Every day | ORAL | 0 refills | Status: AC

## 2024-05-13 NOTE — Patient Instructions (Signed)
 Nice to meet you  I agree with all the treatment so far  Continue Trelegy I think this is a good choice.  If the cough is not improving late next week despite continuing Trelegy, consider taking prednisone , I prescribed 40 mg once a day for 7 days.  I would take it first thing in the morning  Return to clinic to follow-up with Dr. Diania Fortes, nurse practitioner, or Dr. Marygrace Snellen in 4 weeks

## 2024-05-14 NOTE — Progress Notes (Signed)
 @Patient  ID: David [name], male    DOB: Aug 18, 1931, 89 y.o.   MRN: [id number]  Chief Complaint  Patient presents with   Acute Visit    Referring provider: Aldo Hun, MD  HPI:   89 y.o. man who were seen for evaluation of subacute cough, acute visit.  Most recent note from primary pulmonologist Dr. Diania Fortes reviewed.  ED note 02/2024 reviewed.  Patient went to the ED visit with fall 02/2024.  Had rib fracture.  CT scan at that time showed clear lungs, no PE, rib fractures.  Shortly after he developed cough.  Largely nonproductive.  Brings up a little bit of clear phlegm.  No improvement with antibiotics.  No improvement with steroids.  Has received multiple courses of both.  He states has had at least 1 or 2 chest x-ray at PCP office.  All are clear.  We discussed that after a fall and rib fractures the development of pneumonia is quite possible.  However he is been treated with 3 courses of antibiotics without improvement.  He is gone at least 2 or 3 courses of prednisone  without improvement.  Recently switched from Stiolto to Trelegy last week.  We discussed hopefully this will yield more beneficial treatment of symptoms.  We discussed higher doses of prednisone  to see if this would help compared to earlier doses.  We noted challenges with blood sugar and need for adjusting insulin  regimen.  Questionaires / Pulmonary Flowsheets:   ACT:      No data to display          MMRC:     No data to display          Epworth:      No data to display          Tests:   FENO:  No results found for: "NITRICOXIDE"  PFT:    Latest Ref Rng & Units 01/31/2022   11:39 AM  PFT Results  FVC-Pre L 3.49   FVC-Predicted Pre % 87   FVC-Post L 3.50   FVC-Predicted Post % 87   Pre FEV1/FVC % % 71   Post FEV1/FCV % % 73   FEV1-Pre L 2.47   FEV1-Predicted Pre % 88   FEV1-Post L 2.57   DLCO uncorrected ml/min/mmHg 11.30   DLCO UNC% % 45   DLCO corrected ml/min/mmHg 11.30   DLCO  COR %Predicted % 45   DLVA Predicted % 53   TLC L 5.26   TLC % Predicted % 70   RV % Predicted % 64   Personally reviewed interpreted as normal spirometry, no bronchodilator response, lung volumes within normal limits, DLCO severely reduced  WALK:     01/01/2022   10:22 AM 12/29/2021    2:20 PM  SIX MIN WALK  Medications Allopurinol  diltiazem  HCI Ergocalciferol  Fexofenadine HCI Insulin  Lispro (4 units) Levothyroxine  Valsartan-hydrochlorothiazide    Supplimental Oxygen during Test? (L/min) No No  Laps 8   Partial Lap (in Meters) 13 meters   Baseline BP (sitting) 148/78   Baseline Heartrate 63   Baseline Dyspnea (Borg Scale) 2   Baseline Fatigue (Borg Scale) 3   Baseline SPO2 96 %   BP (sitting) 160/78   Heartrate 82   Dyspnea (Borg Scale) 5   Fatigue (Borg Scale) 4   SPO2 90 %   BP (sitting) 142/74   Heartrate 69   SPO2 96 %   Stopped or Paused before Six Minutes No   Distance Completed 285 meters  Distance Completed 13 meters   Tech Comments: Pt completed 8 full laps and 13 metes on final lap at mod pace without any stops. Pt did c/o increased SOB and fatuige at end of walk Patient was able to complete 2 laps at a steady pace. He denied any SOB, chest or leg pain. No O2 needed during or after walk.    Imaging: Personally reviewed and as per EMR discussion in this note No results found.  Lab Results: Personally reviewed CBC    Component Value Date/Time   WBC 10.2 03/19/2024 1520   RBC 3.67 (L) 03/19/2024 1520   HGB 12.2 (L) 03/19/2024 1520   HGB 14.1 01/17/2011 0844   HCT 35.8 (L) 03/19/2024 1520   HCT 40.6 01/17/2011 0844   PLT 168 03/19/2024 1520   PLT 152 01/17/2011 0844   MCV 97.5 03/19/2024 1520   MCV 95.3 01/17/2011 0844   MCH 33.2 03/19/2024 1520   MCHC 34.1 03/19/2024 1520   RDW 14.0 03/19/2024 1520   RDW 13.6 01/17/2011 0844   LYMPHSABS 0.8 03/19/2024 1520   LYMPHSABS 0.9 01/17/2011 0844   MONOABS 0.7 03/19/2024 1520   MONOABS 0.7 01/17/2011 0844    EOSABS 0.0 03/19/2024 1520   EOSABS 0.1 01/17/2011 0844   BASOSABS 0.0 03/19/2024 1520   BASOSABS 0.0 01/17/2011 0844    BMET    Component Value Date/Time   NA 144 03/19/2024 1520   K 4.4 03/19/2024 1520   CL 109 03/19/2024 1520   CO2 23 03/19/2024 1520   GLUCOSE 151 (H) 03/19/2024 1520   BUN 29 (H) 03/19/2024 1520   CREATININE 1.72 (H) 03/19/2024 1520   CREATININE 0.84 09/15/2014 1400   CALCIUM  9.3 03/19/2024 1520   GFRNONAA 37 (L) 03/19/2024 1520   GFRAA 45 (L) 07/25/2017 0348    BNP    Component Value Date/Time   BNP 157.2 (H) 11/14/2021 0232    ProBNP    Component Value Date/Time   PROBNP 1,304.0 (H) 06/14/2012 0725    Specialty Problems       Pulmonary Problems   Chronic obstructive pulmonary disease (HCC)   Qualifier: Diagnosis of  By: Coleridge Davenport        Acute respiratory failure with hypoxia (HCC)   CAP (community acquired pneumonia)   Allergic rhinitis   Hemoptysis   Right upper lobe pneumonia   COPD with acute exacerbation (HCC)   Oxygen dependent    Allergies  Allergen Reactions   Azithromycin  Rash    zpack   Doxycycline Rash    Immunization History  Administered Date(s) Administered   Fluzone Influenza virus vaccine,trivalent (IIV3), split virus 09/18/2011, 09/12/2012, 09/07/2022   Influenza Split 03/07/2010, 04/24/2010, 09/28/2010, 01/08/2011, 09/07/2013, 08/19/2020   Influenza, High Dose Seasonal PF 09/07/2013, 08/29/2016, 09/09/2017, 08/17/2019   Influenza, Quadrivalent, Recombinant, Inj, Pf 08/29/2021, 09/12/2023   PFIZER(Purple Top)SARS-COV-2 Vaccination 09/02/2020, 11/07/2023   Pneumococcal Conjugate-13 11/16/2013   Pneumococcal Polysaccharide-23 06/25/2012, 05/29/2021   Tdap 03/23/2013   Zoster Recombinant(Shingrix) 10/20/2018, 12/26/2018   Zoster, Live 06/06/2011    Past Medical History:  Diagnosis Date   AAA (abdominal aortic aneurysm) (HCC)    AAA (abdominal aortic aneurysm) (HCC)    AAA (abdominal aortic  aneurysm) (HCC)    Anxiety    Atherosclerosis    pt denies this   BPH (benign prostatic hyperplasia)    CKD (chronic kidney disease), stage III (HCC)    family reports caused by scans but that has resolved since CT scans have stopped  Colon cancer (HCC)    COPD (chronic obstructive pulmonary disease) (HCC)    Diabetes mellitus    ED (erectile dysfunction)    Gastroesophageal reflux disease    Heart murmur    Hyperlipidemia    Hypertension    Lung nodules    Neuromuscular disorder (HCC)    Neuropathy feet   Osteopenia    Peripheral vascular disease (HCC)    Pneumonia 05/2012   Positional vertigo    intermittent    Shortness of breath    chronic per patient   Stroke (HCC)    x2 in 2006   Stroke (HCC)    Thyroid  cancer (HCC)    Thyroid  disease    Type 2 diabetes mellitus (HCC)    Vitamin D  deficiency     Tobacco History: Social History   Tobacco Use  Smoking Status Former   Current packs/day: 0.00   Average packs/day: 1.5 packs/day for 50.0 years (75.0 ttl pk-yrs)   Types: Cigarettes   Start date: 05/29/1955   Quit date: 05/28/2005   Years since quitting: 18.9   Passive exposure: Never  Smokeless Tobacco Never   Counseling given: Not Answered   Continue to not smoke  Outpatient Encounter Medications as of 05/13/2024  Medication Sig   acetaminophen  (TYLENOL ) 500 MG tablet Take 1-2 tablets (500-1,000 mg total) by mouth every 8 (eight) hours. (Patient taking differently: Take 500-1,000 mg by mouth every 8 (eight) hours. PRN)   allopurinol  (ZYLOPRIM ) 100 MG tablet Take 150 mg by mouth daily.   BD PEN NEEDLE NANO 2ND GEN 32G X 4 MM MISC USE WITH INSULIN  PENS TO GIVE 4 INJECTIONS DAILY   clopidogrel  (PLAVIX ) 75 MG tablet Take 75 mg by mouth at bedtime.    COMBIVENT RESPIMAT 20-100 MCG/ACT AERS respimat Inhale 1 puff into the lungs in the morning and at bedtime.   diltiazem  (CARDIZEM  CD) 120 MG 24 hr capsule TAKE 1 CAPSULE BY MOUTH EVERY DAY   famotidine  (PEPCID ) 20 MG  tablet Take 1 tablet (20 mg total) by mouth at bedtime.   Fluticasone -Umeclidin-Vilant (TRELEGY ELLIPTA) 100-62.5-25 MCG/ACT AEPB Inhale into the lungs.   HUMALOG KWIKPEN 100 UNIT/ML KwikPen Inject 3 Units into the skin See admin instructions. 3 units in the morning and  units late afternoon/supper time   ipratropium-albuterol  (DUONEB) 0.5-2.5 (3) MG/3ML SOLN Take 3 mLs by nebulization 2 (two) times daily. PRN   ketoconazole  (NIZORAL ) 2 % cream Apply to bottom of feet twice daily (Patient taking differently: Apply 1 Application topically 2 (two) times daily as needed for irritation (Apply to bottom of feet).)   levothyroxine  (SYNTHROID ) 100 MCG tablet Take 100 mcg by mouth daily before breakfast.   lipase/protease/amylase (CREON) 36000 UNITS CPEP capsule Take 1-2 capsules by mouth See admin instructions. 2 capsule with each meal, 1 capsule with each snack   LORazepam  (ATIVAN ) 0.5 MG tablet Take 0.25-0.5 mg by mouth daily as needed for anxiety.   nitroGLYCERIN  (NITROSTAT ) 0.4 MG SL tablet Place 0.4 mg under the tongue every 5 (five) minutes as needed for chest pain.   ONETOUCH VERIO test strip SMARTSIG:Via Meter 4 Times Daily PRN   predniSONE  (DELTASONE ) 20 MG tablet Take 2 tablets (40 mg total) by mouth daily with breakfast for 7 days.   rosuvastatin  (CRESTOR ) 10 MG tablet Take 10 mg by mouth at bedtime.   Vitamin D , Ergocalciferol , (DRISDOL ) 50000 UNITS CAPS Take 50,000 Units by mouth every Monday.    Fluticasone -Salmeterol (ADVAIR ) 250-50 MCG/DOSE AEPB Inhale 1 puff into  the lungs every 12 (twelve) hours. (Patient not taking: Reported on 05/13/2024)   oxyCODONE  (ROXICODONE ) 5 MG immediate release tablet Take 1 tablet (5 mg total) by mouth every 6 (six) hours as needed for up to 15 doses. (Patient not taking: Reported on 05/13/2024)   valsartan-hydrochlorothiazide  (DIOVAN-HCT) 80-12.5 MG tablet Take 1 tablet by mouth daily. (Patient not taking: Reported on 05/13/2024)   [DISCONTINUED] LEVEMIR  FLEXTOUCH  100 UNIT/ML Pen Inject 5 Units into the skin at bedtime. (Patient not taking: Reported on 05/13/2024)   Facility-Administered Encounter Medications as of 05/13/2024  Medication   0.9 %  sodium chloride  infusion     Review of Systems  Review of Systems  No chest pain with exertion.  No orthopnea or PND.  Comprehensive review of systems otherwise negative. Physical Exam  BP (!) 122/52 (BP Location: Right Arm, Patient Position: Sitting, Cuff Size: Normal)   Pulse 76   Ht 6' (1.829 m)   Wt 156 lb 12.8 oz (71.1 kg)   SpO2 93%   BMI 21.27 kg/m   Wt Readings from Last 5 Encounters:  05/13/24 156 lb 12.8 oz (71.1 kg)  03/19/24 163 lb (73.9 kg)  07/26/23 166 lb (75.3 kg)  07/15/23 166 lb (75.3 kg)  07/27/22 171 lb (77.6 kg)    BMI Readings from Last 5 Encounters:  05/13/24 21.27 kg/m  03/19/24 22.11 kg/m  07/26/23 22.51 kg/m  07/15/23 22.51 kg/m  07/27/22 23.19 kg/m     Physical Exam General: Elderly, sitting up in chair Eyes: EOMI, no icterus Neck: Supple, no JVP Pulmonary: Clear lungs, good excursion Cardiovascular: Warm, no edema Abdomen: Nondistended MSK: No synovitis, no joint effusions Neuro: Normal gait, no weakness Psych: Normal mood, full affect  Assessment & Plan:   Subacute cough, bronchitis: Notably no fix obstruction of COPD on PFTs 01/2022, emphysema seen on CT scan.  No improvement despite multiple courses of antibiotics and steroid.  Escalated to Trelegy recently.  Advised him to complete it.  If no improvement in the next week or so advised trying a higher dose of prednisone , new prescription today.  His lung exam is clear.  He reports several chest x-rays being clear at PCP office.  Consider CT scan in the coming weeks if not improving.  Notably CT scan was clear at onset of symptoms, with contrast and no evidence of PE.  Most likely something is triggered ongoing inflammation and ongoing bronchitis versus development of cough variant asthma.   Unfortunately not responding well to steroids or inhalers, hopefully escalation to Trelegy as above will yield improvement over time.  I suspect most likely this will improve with time and maybe time alone.   Return in about 4 weeks (around 06/10/2024) for f/u Dr. Diania Fortes.   Guerry Leek, MD 05/14/2024

## 2024-05-29 ENCOUNTER — Encounter: Payer: Self-pay | Admitting: Podiatry

## 2024-05-29 ENCOUNTER — Ambulatory Visit: Admitting: Podiatry

## 2024-05-29 DIAGNOSIS — D689 Coagulation defect, unspecified: Secondary | ICD-10-CM | POA: Diagnosis not present

## 2024-05-29 DIAGNOSIS — Q828 Other specified congenital malformations of skin: Secondary | ICD-10-CM | POA: Diagnosis not present

## 2024-05-29 DIAGNOSIS — M79676 Pain in unspecified toe(s): Secondary | ICD-10-CM | POA: Diagnosis not present

## 2024-05-29 DIAGNOSIS — E1142 Type 2 diabetes mellitus with diabetic polyneuropathy: Secondary | ICD-10-CM

## 2024-05-29 DIAGNOSIS — B351 Tinea unguium: Secondary | ICD-10-CM | POA: Diagnosis not present

## 2024-05-29 NOTE — Progress Notes (Signed)
 This patient returns to my office for at risk foot care.  This patient requires this care by a professional since this patient will be at risk due to having chronic kidney disease, thrombocytopenia and type 2 diabetes.   Patient is taking plavix .    This patient is unable to cut nails himself since the patient cannot reach his nails.These nails are painful walking and wearing shoes.  Relates painful callus areas as well.  This patient presents for at risk foot care today.   General Appearance  Alert, conversant and in no acute stress.  Vascular  Dorsalis pedis and posterior tibial  pulses are weakly  palpable  bilaterally.  Capillary return is within normal limits  bilaterally. Temperature is within normal limits  Bilaterally.  Venous stasis  B/L.  Neurologic  Senn-Weinstein monofilament wire test absent   bilaterally. Muscle power within normal limits bilaterally.  Nails Thick disfigured discolored nails with subungual debris  from hallux to fifth toes bilaterally. No evidence of bacterial infection or drainage bilaterally. Hyperkeratotic tissue noted bilateral plantar fifth metatarsals.   Orthopedic  No limitations of motion  feet .  No crepitus or effusions noted.  HAV  B/L.  Hammer toes  B/L.  Exostosis IPJ left hallux.  Skin  normotropic skin with no porokeratosis noted bilaterally.  No signs of infections or ulcers noted.   Porokeratosis sub 5th  Left foot.  Onychomycosis  Pain in right toes  Pain in left toes  Consent was obtained for treatment procedures.   Mechanical debridement of nails 1-5  bilaterally performed with a nail nipper.  Filed with dremel without incident. Hyperkeratotic tissue debrided without incident x2 bilateral.     Return office visit   10 weeks       Told patient to return for periodic foot care and evaluation due to potential at risk complications.   Jennefer Moats DPM

## 2024-06-20 ENCOUNTER — Other Ambulatory Visit: Payer: Self-pay | Admitting: Internal Medicine

## 2024-06-30 ENCOUNTER — Other Ambulatory Visit: Payer: Self-pay | Admitting: Internal Medicine

## 2024-08-27 ENCOUNTER — Ambulatory Visit: Admitting: Podiatry

## 2024-08-27 ENCOUNTER — Encounter: Payer: Self-pay | Admitting: Podiatry

## 2024-08-27 DIAGNOSIS — M79676 Pain in unspecified toe(s): Secondary | ICD-10-CM | POA: Diagnosis not present

## 2024-08-27 DIAGNOSIS — B351 Tinea unguium: Secondary | ICD-10-CM

## 2024-08-27 DIAGNOSIS — E1142 Type 2 diabetes mellitus with diabetic polyneuropathy: Secondary | ICD-10-CM | POA: Diagnosis not present

## 2024-08-27 DIAGNOSIS — Q828 Other specified congenital malformations of skin: Secondary | ICD-10-CM

## 2024-08-27 NOTE — Progress Notes (Signed)
 This patient returns to my office for at risk foot care.  This patient requires this care by a professional since this patient will be at risk due to having chronic kidney disease, thrombocytopenia and type 2 diabetes.   Patient is taking plavix .    This patient is unable to cut nails himself since the patient cannot reach his nails.These nails are painful walking and wearing shoes.  Relates painful callus areas as well.  This patient presents for at risk foot care today.   General Appearance  Alert, conversant and in no acute stress.  Vascular  Dorsalis pedis and posterior tibial  pulses are weakly  palpable  bilaterally.  Capillary return is within normal limits  bilaterally. Temperature is within normal limits  Bilaterally.  Venous stasis  B/L.  Neurologic  Senn-Weinstein monofilament wire test absent   bilaterally. Muscle power within normal limits bilaterally.  Nails Thick disfigured discolored nails with subungual debris  from hallux to fifth toes bilaterally. No evidence of bacterial infection or drainage bilaterally. Hyperkeratotic tissue noted bilateral plantar fifth metatarsals.   Orthopedic  No limitations of motion  feet .  No crepitus or effusions noted.  HAV  B/L.  Hammer toes  B/L.  Exostosis IPJ left hallux.  Skin  normotropic skin with no porokeratosis noted bilaterally.  No signs of infections or ulcers noted.   Porokeratosis sub 5th  Left foot.  Onychomycosis  Pain in right toes  Pain in left toes  Consent was obtained for treatment procedures.   Mechanical debridement of nails 1-5  bilaterally performed with a nail nipper.  Filed with dremel without incident. Hyperkeratotic tissue debrided without incident x2 bilateral.     Return office visit   12 weeks       Told patient to return for periodic foot care and evaluation due to potential at risk complications.   Asberry Failing DPM

## 2024-09-09 NOTE — Progress Notes (Signed)
 Orthopaedic Surgery Hand and Upper Extremity History and Physical Examination  CC: Follow-up right distal radius fracture  HPI 09/09/2024: David [name] is a 89 y.o. male presents for follow-up evaluation of right distal radius fracture that has been treated nonoperatively.  DOI 03/19/2024.  He states that he is extremely displeased with his progress.  He is bothered by the size of his wrist, his limited mobility at the wrist, and his flexion contracture with inability to completely flatten the palm of his hand and fingers.  He has been working with therapy but has not made any progress.  He states that he is upset that he feels we did not adequately communicate with therapy what needed to be performed.  Overall he is not happy with his outcomes after his injury.   Problem List:  Problem List[1]  Past Medical History: Medical History[2]   Medications: Current Rx ordered in [facility][3]  Allergies: Allergies as of 09/09/2024 - Reviewed 09/09/2024  Allergen Reaction Noted  . Azithromycin  Rash 02/01/2017  . Doxycycline Rash 06/13/2015    Past Surgical History: Surgical History[4]   Social History: Social History   Occupational History  . Not on file  Tobacco Use  . Smoking status: Former    Current packs/day: 0.00    Types: Cigarettes    Quit date: 2006    Years since quitting: 19.7  . Smokeless tobacco: Not on file  Vaping Use  . Vaping status: Never Used  Substance and Sexual Activity  . Alcohol use: Not Currently  . Drug use: Never  . Sexual activity: Not Currently     Family History: Family History[5] Otherwise, no relevant orthopaedic family history  ROS: Review of Systems: All systems reviewed and are negative except that mentioned in HPI  Work/Sport/Hobbies: See HPI  Physical Examination: Vitals:   09/09/24 1045  BP: (!) 173/66  Pulse: 59   Constitutional: Awake, alert.  WN/WD Appearance: healthy, no acute distress, well-groomed Affect:  Normal HEENT: EOMI, mucous membranes moist CV: RRR Pulm: breathing comfortably  Right Upper Extremity / Hand Overlying skin is warm dry and intact.  There is no signs of rash irritation or infection.  There is no obvious deformity.  No obvious nail deformities.  Capillary refill is brisk and skin turgor is appropriate.  Neurovascularly intact.    Nontender to palpation at the distal radius.  Enlargement of the wrist secondary to bony callus formation at the fracture site.  Inability to fully flatten the palm and fingers secondary to Dupuytren's cords in the palm in line with the long and ring fingers.  Sensation: intact to light touch in median, radial and ulnar nerve distributions  Motor: intact in AIN, PIN, and ulnar nerve distributions Vascular: Fingers warm and well perfused, palpable radial pulse   Imaging: I have personally reviewed the following studies: Right wrist x-rays show well-healed distal radius fracture with adequate positioning and loss of visualization of the fracture line.  There is significant callus formation and fracture at the distal radius.   Assessment/Plan: 1.  Right distal radius fracture: Well-healed - Discussed that the enlargement at the wrist is due to the significant callus formation surrounding the fracture.  Discussed that this will not reduce in size. - Activity as tolerated   2.  Right hand Dupuytren's cords after injury - Discussed the nature of Dupuytren's disease.  We discussed treatment options including needle aponeurotomy, collagenase injection, fasciotomy, and fasciectomy.  Risks, benefits, and alternatives of surgery were discussed including risk of blood loss, infection,  damage to nerves, vessels, tendons, ligaments and bone, failure of surgery, need for additional surgery, complications with wound healing, continued pain, complex regional pain syndrome and loss of digit.  We also discussed that with any treatment the contracture may recur  immediately or not recur at all.  He was provided a handout on Dupuytren's contracture for further reading.   He would like to follow up as needed at this time.   Betty Anton, PA-C [facility] Department of Orthopaedic Surgery 09/09/2024 2:55 PM      [1] Patient Active Problem List Diagnosis  . Abdominal aortic aneurysm without rupture  . Allergic rhinitis  . Anxiety  . Aortic atherosclerosis  . Benign prostatic hyperplasia with lower urinary tract symptoms  . CAP (community acquired pneumonia)  . Aortic stenosis, mild  . Cataract  . Cerebral artery occlusion with cerebral infarction    (CMD)  . Acute respiratory failure with hypoxia    (CMD)  . Chronic obstructive pulmonary disease    (CMD)  . COPD with acute exacerbation    (CMD)  . CKD (chronic kidney disease) stage 3, GFR 30-59 ml/min (CMD)  . Type 2 diabetes mellitus with diabetic chronic kidney disease    (CMD)  . Diabetic peripheral neuropathy associated with type 2 diabetes mellitus    (CMD)  . Diabetic renal disease    (CMD)  . Difficult or painful urination  . Dyslipidemia  . Enthesopathy  . Gastro-esophageal reflux disease without esophagitis  . Gout  . Hemoptysis  . Herpes zoster without complication  . History of stroke  . Hyperlipidemia associated with type 2 diabetes mellitus    (CMD)  . HTN (hypertension)  . Hypertension associated with diabetes    (CMD)  . Hypertensive heart and chronic kidney disease with heart failure and stage 1 through stage 4 chronic kidney disease, or unspecified chronic kidney disease    (CMD)  . Hypothyroidism  . Vitamin D  deficiency  . Thrombocytopenia  . Thoracic aortic aneurysm without rupture  . Right upper lobe pneumonia  . PVC's (premature ventricular contractions)  . Porokeratosis  . Polyneuropathy  . PAC (premature atrial contraction)  . Oxygen dependent  . Malignant tumor of urinary bladder    (CMD)  . Non-thrombocytopenic purpura  .  Malignant tumor of thyroid  gland    (CMD)  . Male erectile disorder  . Leukocytosis  . Low back pain  . Closed fracture of right distal radius  [2] Past Medical History: Diagnosis Date  . Basal cell carcinoma   . COPD (chronic obstructive pulmonary disease)    (CMD)   . Diabetes mellitus    (CMD)   . Hyperlipemia   . Hypertension   . SCC (squamous cell carcinoma)   . Stroke    (CMD)   . Thyroid  disease   [3] Meds Ordered in [facility]  Medication Sig Dispense Refill  . allopurinoL  (ZYLOPRIM ) 100 mg tablet Take 150 mg by mouth daily.    . clopidogreL  (PLAVIX ) 75 mg tablet Take 75 mg by mouth daily.    . clotrimazole-betamethasone (LOTRISONE) 1-0.05 % cream APPLY TO RASH ON FEET UP TO TWICE A DAY AS NEEDED FOR ITCH    . dilTIAZem  (CARDIZEM  CD) 120 mg 24 hr capsule Take 1 capsule by mouth daily.    . ergocalciferol  (VITAMIN D2) 1,250 mcg (50,000 unit) capsule TAKE ONE CAPSULE BY MOUTH ONCE WEEKLY 84    . famotidine  (PEPCID ) 20 mg tablet Take 20 mg by mouth daily.    SABRA  fluticasone  propion-salmeteroL (ADVAIR  DISKUS) 250-50 mcg/dose diskus inhaler Inhale 1 puff.    . hydroCHLOROthiazide  (HYDRODIURIL ) 12.5 mg tablet take 1 tablet by mouth every day in the morning for 30 days    . insulin  lispro (HumaLOG KwikPen Insulin ) 100 unit/mL KwikPen Inject under the skin 2 (two) times a day.    . ipratropium-albuteroL  (Combivent Respimat) 20-100 mcg/actuation inhaler Inhale 1 puff  in the morning and 1 puff at noon and 1 puff in the evening and 1 puff before bedtime.    . Lantus  Solostar U-100 Insulin  100 unit/mL (3 mL) pen INJECT 5 UNITS INTO THE SKIN EACH EVENING **EACH PEN EXPIRES 28 DAYS AFTER 1ST USE**AS DIRECTED    . levoFLOXacin  (LEVAQUIN ) 750 mg tablet 1 tablet.    . levothyroxine  (SYNTHROID ) 100 mcg tablet Take 100 mcg by mouth daily.    . LORazepam  (ATIVAN ) 0.5 mg tablet TAKE 1/2 TO 1 TABLET up to twice a day as needed or anxiety Orally up to twice a day as needed for 30 days    .  mirtazapine  (REMERON  SOLTAB) 15 mg disintegrating tablet Dissolve 15 mg on tongue at bedtime.    SABRA Speak 2nd Gen Pen Needle 32 gauge x 5/32 ndle USE WITH INSULIN  PENS TO GIVE 4 INJECTIONS DAILY 90    . nitroglycerin  (NITROSTAT ) 0.4 mg SL tablet as directed Sublingual as needed for 30 days    . OneTouch Verio test strips test strip USE TO MONITOR BLOOD 4 TIMES DAILY AND AS NEEDED    . pancrelipase, Lip-Prot-Amyl, (Creon) 36,000-114,000- 180,000 unit capsule TAKE TWO CAPSULES WITH EACH MEAL AND ONE WITH EACH SNACK ORALLY 30 DAYS    . rosuvastatin  (CRESTOR ) 10 mg tablet Take 10 mg by mouth daily.    . Trelegy Ellipta 100-62.5-25 mcg inhaler Inhale 1 puff daily.    . valsartan-hydroCHLOROthiazide  80-12.5 mg tab      No current Epic-ordered facility-administered medications on file.  [4] Past Surgical History: Procedure Laterality Date  . HERNIA REPAIR  07/25/2023  . HERNIA REPAIR  2024  . THORACIC AORTIC ANEURYSM REPAIR    . THYROIDECTOMY  2007  [5] No family history on file.

## 2024-09-21 ENCOUNTER — Other Ambulatory Visit: Payer: Self-pay

## 2024-09-22 ENCOUNTER — Ambulatory Visit: Attending: Internal Medicine | Admitting: Internal Medicine

## 2024-09-22 VITALS — BP 155/73 | HR 56 | Ht 72.0 in | Wt 157.8 lb

## 2024-09-22 DIAGNOSIS — I350 Nonrheumatic aortic (valve) stenosis: Secondary | ICD-10-CM | POA: Diagnosis not present

## 2024-09-22 DIAGNOSIS — I700 Atherosclerosis of aorta: Secondary | ICD-10-CM

## 2024-09-22 DIAGNOSIS — I493 Ventricular premature depolarization: Secondary | ICD-10-CM | POA: Diagnosis not present

## 2024-09-22 MED ORDER — DILTIAZEM HCL ER COATED BEADS 120 MG PO CP24: 120.0000 mg | ORAL_CAPSULE | Freq: Every day | ORAL | 3 refills | Status: DC

## 2024-09-22 MED ORDER — NITROGLYCERIN 0.4 MG SL SUBL: 0.4000 mg | SUBLINGUAL_TABLET | SUBLINGUAL | 3 refills | Status: DC | PRN

## 2024-09-22 MED ORDER — DILTIAZEM HCL ER COATED BEADS 120 MG PO CP24: 120.0000 mg | ORAL_CAPSULE | Freq: Every day | ORAL | 3 refills | Status: AC

## 2024-09-22 NOTE — Patient Instructions (Signed)
 Medication Instructions:  Your physician recommends that you continue on your current medications as directed. Please refer to the Current Medication list given to you today.  *If you need a refill on your cardiac medications before your next appointment, please call your pharmacy*  Lab Work: NONE  If you have labs (blood work) drawn today and your tests are completely normal, you will receive your results only by: MyChart Message (if you have MyChart) OR A paper copy in the mail If you have any lab test that is abnormal or we need to change your treatment, we will call you to review the results.  Testing/Procedures: NONE  Follow-Up: At [facility], you and your health needs are our priority.  As part of our continuing mission to provide you with exceptional heart care, our providers are all part of one team.  This team includes your primary Cardiologist (physician) and Advanced Practice Providers or APPs (Physician Assistants and Nurse Practitioners) who all work together to provide you with the care you need, when you need it.  Your next appointment:   1 year(s)  Provider:   Stanly DELENA Leavens, MD    We recommend signing up for the patient portal called MyChart.  Sign up information is provided on this After Visit Summary.  MyChart is used to connect with patients for Virtual Visits (Telemedicine).  Patients are able to view lab/test results, encounter notes, upcoming appointments, etc.  Non-urgent messages can be sent to your provider as well.   To learn more about what you can do with MyChart, go to [email or web address].

## 2024-09-22 NOTE — Progress Notes (Signed)
 Cardiology Office Note:  .    Date:  09/22/2024  ID:  David [name], DOB 23-Aug-1931, MRN [id number] PCP: Shayne Anes, MD  [facility] Providers Cardiologist:  Stanly DELENA Leavens, MD     CC: Follow up AS  History of Present Illness: David [name] is a 89 y.o. male with thoracic aortic dilation who presents for follow-up of his cardiac conditions.  He has a history of thoracic aortic dilation, COPD, CKD stage 3, prior strokes, hypertension, and hyperlipidemia. He was last seen in 2023 and has been managed for PVCs in the context of his cardiac conditions. He is currently off valsartan hydrochlorothiazide , which may contribute to the elevated blood pressure. His current medications include diltiazem , which he needs a refill for, Plavix  75 mg, and rosuvastatin  10 mg, which has kept his LDL levels well controlled.  He has 'an awful lot of white looking stuff' in his throat and is concerned about its appearance despite being on 'fresh medicine.' He is also on medication for his bowels due to pancreatic insufficiency, which causes frequent bowel movements. His diabetes is well-regulated, and his cholesterol levels are good.  He has experienced significant family loss recently, with the passing of his wife, brother, and sister within four months. His wife had cancer, his brother had Alzheimer's, and his sister also succumbed to cancer. This has been a difficult year for him and his family.  He has a history of mild aortic stenosis and mild mitral regurgitation, with a mildly dilated aorta. An echocardiogram performed last year showed normal strain and function. He has not required nitroglycerin  for cardiac symptoms. He has a history of lumps and bumps in his legs, which have been monitored over time.  Discussed the use of AI scribe software for clinical note transcription with the patient, who gave verbal consent to proceed.   Relevant histories: .  Social  - patient  of mine since 2022: in interval lost his wife, a son and daughter to chronic disease; he is accompanied now by his son David [name] (also my patient) ROS: As per HPI.   Studies Reviewed: .     Cardiac Studies & Procedures   ______________________________________________________________________________________________   STRESS TESTS  MYOCARDIAL PERFUSION IMAGING 12/28/2021  Interpretation Summary   No ST deviation was noted.   Left ventricular function is normal. Nuclear stress EF: 58 %. The left ventricular ejection fraction is normal (55-65%). End diastolic cavity size is normal.   There is very mild attenuation of the inferior apical wall on the upright stress images.   This same defect was seen on the previous study in 2015.   Prior study available for comparison from 10/08/2014 which showed a fixed inferior apical defect at stress and rest.   The upright stress images on this study  show only minimal attenuation of the inferioapical region.  The LV contractility is normal .   The study is normal. The study is low risk.   ECHOCARDIOGRAM  ECHOCARDIOGRAM COMPLETE 03/04/2023  Narrative ECHOCARDIOGRAM REPORT    Patient Name:   David [name] Date of Exam: 03/04/2023 Medical Rec #:  [id number]          Height:       72.0 in Accession #:    [id number]         Weight:       171.0 lb Date of Birth:  [birth year]         BSA:  1.993 m Patient Age:    89 years           BP:           126/64 mmHg Patient Gender: M                  HR:           66 bpm. Exam Location:  [address]  Procedure: 2D Echo, Cardiac Doppler, Color Doppler and Strain Analysis  Indications:    I35.0 Aortic Stenosis  History:        Patient has prior history of Echocardiogram examinations, most recent 11/10/2021. COPD, Stroke and PVD, AAA repair, Signs/Symptoms:Murmur; Risk Factors:Hypertension and HLD.  Sonographer:    Waldo Guadalajara RCS Referring Phys: [id number] Nicloe Frontera A  Londell Noll  IMPRESSIONS   1. Left ventricular ejection fraction, by estimation, is 55 to 60%. The left ventricle has normal function. The left ventricle has no regional wall motion abnormalities. There is mild left ventricular hypertrophy. Left ventricular diastolic parameters are consistent with Grade I diastolic dysfunction (impaired relaxation). The average left ventricular global longitudinal strain is -20.1 %. The global longitudinal strain is normal. 2. Right ventricular systolic function is normal. The right ventricular size is normal. There is normal pulmonary artery systolic pressure. 3. Right atrial size was mildly dilated. 4. The mitral valve is normal in structure. Mild mitral valve regurgitation. No evidence of mitral stenosis. 5. The aortic valve is tricuspid. There is moderate calcification of the aortic valve. There is moderate thickening of the aortic valve. Aortic valve regurgitation is trivial. Mild aortic valve stenosis. Aortic valve area, by VTI measures 1.12 cm. Aortic valve mean gradient measures 10.0 mmHg. Aortic valve Vmax measures 2.14 m/s. 6. There is mild dilatation of the aortic root, measuring 36 mm. 7. The inferior vena cava is normal in size with greater than 50% respiratory variability, suggesting right atrial pressure of 3 mmHg.  FINDINGS Left Ventricle: Left ventricular ejection fraction, by estimation, is 55 to 60%. The left ventricle has normal function. The left ventricle has no regional wall motion abnormalities. The average left ventricular global longitudinal strain is -20.1 %. The global longitudinal strain is normal. The left ventricular internal cavity size was normal in size. There is mild left ventricular hypertrophy. Left ventricular diastolic parameters are consistent with Grade I diastolic dysfunction (impaired relaxation). Indeterminate filling pressures.  Right Ventricle: The right ventricular size is normal. No increase in right ventricular  wall thickness. Right ventricular systolic function is normal. There is normal pulmonary artery systolic pressure. The tricuspid regurgitant velocity is 2.87 m/s, and with an assumed right atrial pressure of 3 mmHg, the estimated right ventricular systolic pressure is 35.9 mmHg.  Left Atrium: Left atrial size was normal in size.  Right Atrium: Right atrial size was mildly dilated.  Pericardium: There is no evidence of pericardial effusion.  Mitral Valve: The mitral valve is normal in structure. Mild mitral valve regurgitation. No evidence of mitral valve stenosis.  Tricuspid Valve: The tricuspid valve is normal in structure. Tricuspid valve regurgitation is mild . No evidence of tricuspid stenosis.  Aortic Valve: The aortic valve is tricuspid. There is moderate calcification of the aortic valve. There is moderate thickening of the aortic valve. Aortic valve regurgitation is trivial. Aortic regurgitation PHT measures 1559 msec. Mild aortic stenosis is present. Aortic valve mean gradient measures 10.0 mmHg. Aortic valve peak gradient measures 18.3 mmHg. Aortic valve area, by VTI measures 1.12 cm.  Pulmonic Valve: The pulmonic valve was  normal in structure. Pulmonic valve regurgitation is mild. No evidence of pulmonic stenosis.  Aorta: The aortic root is normal in size and structure. There is mild dilatation of the aortic root, measuring 36 mm.  Venous: The inferior vena cava is normal in size with greater than 50% respiratory variability, suggesting right atrial pressure of 3 mmHg.  IAS/Shunts: No atrial level shunt detected by color flow Doppler.   LEFT VENTRICLE PLAX 2D LVIDd:         4.60 cm   Diastology LVIDs:         3.30 cm   LV e' medial:    6.74 cm/s LV PW:         1.00 cm   LV E/e' medial:  12.5 LV IVS:        1.30 cm   LV e' lateral:   9.90 cm/s LVOT diam:     1.80 cm   LV E/e' lateral: 8.5 LV SV:         56 LV SV Index:   28        2D Longitudinal Strain LVOT Area:      2.54 cm  2D Strain GLS (A2C):   -21.5 % 2D Strain GLS (A3C):   -21.9 % 2D Strain GLS (A4C):   -16.8 % 2D Strain GLS Avg:     -20.1 %  RIGHT VENTRICLE RV Basal diam:  4.10 cm RV Mid diam:    3.30 cm RVSP:           35.9 mmHg  LEFT ATRIUM             Index        RIGHT ATRIUM           Index LA diam:        4.10 cm 2.06 cm/m   RA Pressure: 3.00 mmHg LA Vol (A2C):   69.9 ml 35.07 ml/m  RA Area:     20.50 cm LA Vol (A4C):   56.0 ml 28.09 ml/m  RA Volume:   62.90 ml  31.56 ml/m LA Biplane Vol: 62.9 ml 31.56 ml/m AORTIC VALVE AV Area (Vmax):    1.16 cm AV Area (Vmean):   1.07 cm AV Area (VTI):     1.12 cm AV Vmax:           214.00 cm/s AV Vmean:          154.000 cm/s AV VTI:            0.501 m AV Peak Grad:      18.3 mmHg AV Mean Grad:      10.0 mmHg LVOT Vmax:         97.40 cm/s LVOT Vmean:        64.500 cm/s LVOT VTI:          0.220 m LVOT/AV VTI ratio: 0.44 AI PHT:            1559 msec  AORTA Ao Root diam: 3.60 cm Ao Asc diam:  3.50 cm  MITRAL VALVE                  TRICUSPID VALVE MV Area (PHT):                TR Peak grad:   32.9 mmHg MV Decel Time:                TR Vmax:        287.00 cm/s MR Peak grad:  67.6 mmHg    Estimated RAP:  3.00 mmHg MR Mean grad:    46.0 mmHg    RVSP:           35.9 mmHg MR Vmax:         411.00 cm/s MR Vmean:        323.0 cm/s   SHUNTS MR PISA:         3.08 cm     Systemic VTI:  0.22 m MR PISA Eff ROA: 23 mm       Systemic Diam: 1.80 cm MR PISA Radius:  0.70 cm MV E velocity: 84.40 cm/s MV A velocity: 134.00 cm/s MV E/A ratio:  0.63  Annabella Scarce MD Electronically signed by Annabella Scarce MD Signature Date/Time: 03/04/2023/5:26:21 PM    Final    MONITORS  LONG TERM MONITOR (3-14 DAYS) 02/06/2022  Narrative  Patient had a minimum heart rate of 21 bpm, maximum heart rate of 108 bpm, and average heart rate of 60 bpm.  Predominant underlying rhythm was sinus rhythm.  One run of supraventricular tachycardia  occurred lasting 18 beats at longest with a max rate of 104 bpm at fastest.  Isolated PACs were rare (<1.0%).  Isolated PVCs were frequent (9.6%).  Multiple episode of complete heart block (nocturnal and asymptomatic).  Triggered and diary events associated with sinus rhythm, PAC, and PVC.  Asymptomatic, nocturnal heart block and frequent PVCs       ______________________________________________________________________________________________       Physical Exam:    VS:  BP (!) 155/73   Pulse (!) 56   Ht 6' (1.829 m)   Wt 157 lb 12.8 oz (71.6 kg)   SpO2 100%   BMI 21.40 kg/m    Wt Readings from Last 3 Encounters:  09/22/24 157 lb 12.8 oz (71.6 kg)  05/13/24 156 lb 12.8 oz (71.1 kg)  03/19/24 163 lb (73.9 kg)    Gen: no distress, appears younger than stated age   Neck: No JVD Ears: bilateral Frank Sign Cardiac: No Rubs or Gallops, systolic Murmur, regular bradycardia, +2 radial pulses Respiratory: Clear to auscultation bilaterally, normal effort, normal  respiratory rate GI: Soft, nontender, non-distended  MS: trace bilateral edema;  moves all extremities Integument: Skin feels warm Neuro:  At time of evaluation, alert and oriented to person/place/time/situation  Psych: Normal affect, patient feels ok   ASSESSMENT AND PLAN: .    Mild aortic stenosis and mild thoracic aortic dilation and mild aortic regurgitation Mild aortic stenosis and mild thoracic aortic dilation on echocardiogram with no significant progression. He is functional and active for his age. Open heart surgery is not considered due to age and overall health status. - does not want to consider TAVR in the future at this time; we will pause echo screening after discussing with him  Hypertension Blood pressure is elevated today, possibly due to discontinuation of valsartan hydrochlorothiazide . Previous readings have been normal. Discussed reintroducing medication if blood pressure remains elevated at  home. - Monitor blood pressure at home. - Consider reintroducing valsartan hydrochlorothiazide  if blood pressure remains elevated (other BP measurements and amb BP has been normal)  Hyperlipidemia LDL levels are well controlled with current rosuvastatin  therapy. - Continue rosuvastatin  10 mg daily.  Prior Stroke PAD Aortic atherosclerosis HLD with DM - continue plavix  and statin  PVCs - resolved, asymptomatic, no therapy change, continue CCB  Goals of Care He prefers comfort-focused care and has a DNR order. He wishes to avoid aggressive interventions unless necessary for  maintaining current quality of life. Recent family losses have influenced his decision-making regarding end-of-life care. He is open to less invasive procedures if symptoms worsen but wishes to avoid open heart surgery. He values maintaining his current level of function and quality of life. - Respect DNR order and focus on comfort care as needed - Avoid unnecessary testing unless symptoms change no longer planning cholesterol screens or echo f/u unless things change  Longitudinal care: The evaluation and management services provided today reflect the complexity inherent in caring for this patient, including the ongoing longitudinal relationship and management of multiple chronic conditions and/or the need for care coordination. The visit required a comprehensive assessment and management plan tailored to the patient's unique needs Time was spent addressing not only the acute concerns but also the broader context of the patient's health, including preventive care, chronic disease management, and care coordination as appropriate.  Complex longitudinal is necessary for conditions including: AS and AI management after transition to conservative goals of care.  If, in the future, he transitions to full comfort, we will transition off his care team as appropriate.  Deferred PYP testing after discussion   Stanly Leavens,  MD FASE [facility] Cardiologist [facility]  [address] [place], [address] [place], [place] [address] [phone]  11:54 AM

## 2024-09-29 ENCOUNTER — Ambulatory Visit: Admitting: Pulmonary Disease

## 2024-09-29 ENCOUNTER — Encounter: Payer: Self-pay | Admitting: Pulmonary Disease

## 2024-09-29 VITALS — BP 152/72 | HR 68 | Ht 72.0 in | Wt 155.0 lb

## 2024-09-29 DIAGNOSIS — Z87891 Personal history of nicotine dependence: Secondary | ICD-10-CM

## 2024-09-29 DIAGNOSIS — J432 Centrilobular emphysema: Secondary | ICD-10-CM | POA: Diagnosis not present

## 2024-09-29 DIAGNOSIS — Z66 Do not resuscitate: Secondary | ICD-10-CM

## 2024-09-29 DIAGNOSIS — I639 Cerebral infarction, unspecified: Secondary | ICD-10-CM

## 2024-09-29 DIAGNOSIS — J449 Chronic obstructive pulmonary disease, unspecified: Secondary | ICD-10-CM | POA: Diagnosis not present

## 2024-09-29 DIAGNOSIS — K8681 Exocrine pancreatic insufficiency: Secondary | ICD-10-CM | POA: Diagnosis not present

## 2024-09-29 DIAGNOSIS — E119 Type 2 diabetes mellitus without complications: Secondary | ICD-10-CM

## 2024-09-29 MED ORDER — TRELEGY ELLIPTA 200-62.5-25 MCG/ACT IN AEPB: INHALATION_SPRAY | RESPIRATORY_TRACT | Status: AC

## 2024-09-29 NOTE — Patient Instructions (Addendum)
 Continue Trelegy 1 puff daily - rinse mouth out after each use  Use nebulizer treatments as needed  Use flutter valve twice daily for airway clearance  We can consider increasing the dose of the trelegy inhaler in the future  Follow up in 1 year, call sooner if needed

## 2024-09-29 NOTE — Progress Notes (Unsigned)
 Synopsis: Referred in December 2022 for Pneumonia by Manuelita Ruth, NP  Subjective:   PATIENT ID: David [name] GENDER: male DOB: 04/18/1931, MRN: [id number]  HPI  No chief complaint on file.  David [name] is an 89 year old male, former smoker with DMII, hypertension, CVA, and COPD who returns to pulmonary clinic for pneumonia and emphysema.   He reports improvement in his chest congestion since last visit with the addition of Famotidine  20mg  at bedtime and using a wedge pillow for GERD.  He remains on advair  250-26mcg 1 puff twice dialy and Combivent twice daily. He is using duonebs and flutter valve as needed.   OV 12/29/21 He has been doing well since last visit. He complains of chest congestion that is sometimes difficult to cough up. He is using nebulizer treatments and flutter valve which do help. He denies sinus congestion or drainage. He does have reflux symptoms.   He continues to use 2L of supplemental oxygen. He is participating in home physical therapy.  OV 12/01/21 He was admitted 11/18 to 11/22 for acute respiratory failure and pneumonia. He tested positive for covid on 11/15. He was treated with short course of antibiotics and steroids. He was discharged on supplemental O2, 1L at rest and 2L with ambulation.   Patient is feeling somewhat better since hospitalization but he continues to have cough with clear sputum production and exertional shortness of breath.  He has also noticed mild intermittent epistaxis since starting oxygen therapy.  He is using Advair  250-50 MCG 1 puff twice daily, Combivent a couple times a day and DuoNeb nebulizer treatments a couple times per day.  He has flutter valve from hospital that he is using throughout the day as well. He completed course of levaquin  yesterday.   Patient reports history of premature ventricular contractions as well as premature atrial contractions and reports his heart rate can be erratic at times. He has follow up  with cardiology in January but is trying to move up this appointment.   He denies seasonal allergies. He denies sinus congestion or nasal drainage. He complains of ear popping on left. Denies ear fullness.   He is a former smoker with 75 pack year history. He quit in 2006.   Past Medical History:  Diagnosis Date   AAA (abdominal aortic aneurysm)    AAA (abdominal aortic aneurysm)    AAA (abdominal aortic aneurysm)    Anxiety    Atherosclerosis    pt denies this   BPH (benign prostatic hyperplasia)    CKD (chronic kidney disease), stage III (HCC)    family reports caused by scans but that has resolved since CT scans have stopped   Colon cancer (HCC)    COPD (chronic obstructive pulmonary disease) (HCC)    Diabetes mellitus    ED (erectile dysfunction)    Gastroesophageal reflux disease    Heart murmur    Hyperlipidemia    Hypertension    Lung nodules    Neuromuscular disorder (HCC)    Neuropathy feet   Osteopenia    Peripheral vascular disease    Pneumonia 05/2012   Positional vertigo    intermittent    Shortness of breath    chronic per patient   Stroke (HCC)    x2 in 2006   Stroke (HCC)    Thyroid  cancer (HCC)    Thyroid  disease    Type 2 diabetes mellitus (HCC)    Vitamin D  deficiency      Family History  Problem Relation Age of Onset   Stomach cancer Mother    Heart failure Mother    Breast cancer Mother    Pneumonia Father    Diabetes Brother    Heart disease Brother        Heart Disease before age 44,  AAA and  Carotid   Heart attack Brother    Diabetes Paternal Uncle      Social History   Socioeconomic History   Marital status: Married    Spouse name: Not on file   Number of children: Not on file   Years of education: Not on file   Highest education level: Not on file  Occupational History   Occupation: Retired    Associate Professor: RETIRED  Tobacco Use   Smoking status: Former    Current packs/day: 0.00    Average packs/day: 1.5 packs/day for 50.0  years (75.0 ttl pk-yrs)    Types: Cigarettes    Start date: 05/29/1955    Quit date: 05/28/2005    Years since quitting: 19.3    Passive exposure: Never   Smokeless tobacco: Never  Vaping Use   Vaping status: Never Used  Substance and Sexual Activity   Alcohol use: No    Alcohol/week: 0.0 standard drinks of alcohol   Drug use: No   Sexual activity: Not Currently  Other Topics Concern   Not on file  Social History Narrative   Not on file   Social Drivers of Health   Financial Resource Strain: Not on file  Food Insecurity: Low Risk  (05/01/2024)   Received from [facility]   Hunger Vital Sign    Within the past 12 months, you worried that your food would run out before you got money to buy more: Never true    Within the past 12 months, the food you bought just didn't last and you didn't have money to get more. : Never true  Transportation Needs: No Transportation Needs (05/01/2024)   Received from [facility]    In the past 12 months, has lack of reliable transportation kept you from medical appointments, meetings, work or from getting things needed for daily living? : No  Physical Activity: Not on file  Stress: Not on file  Social Connections: Not on file  Intimate Partner Violence: Not on file     Allergies  Allergen Reactions   Azithromycin  Rash    zpack   Doxycycline Rash     Outpatient Medications Prior to Visit  Medication Sig Dispense Refill   acetaminophen  (TYLENOL ) 500 MG tablet Take 1-2 tablets (500-1,000 mg total) by mouth every 8 (eight) hours. (Patient taking differently: Take 500-1,000 mg by mouth every 8 (eight) hours. PRN) 30 tablet 0   allopurinol  (ZYLOPRIM ) 100 MG tablet Take 150 mg by mouth daily.     BD PEN NEEDLE NANO 2ND GEN 32G X 4 MM MISC USE WITH INSULIN  PENS TO GIVE 4 INJECTIONS DAILY     clopidogrel  (PLAVIX ) 75 MG tablet Take 75 mg by mouth at bedtime.      clotrimazole-betamethasone (LOTRISONE) cream Apply topically as needed  (rash).     COMBIVENT RESPIMAT 20-100 MCG/ACT AERS respimat Inhale 1 puff into the lungs in the morning and at bedtime.     diltiazem  (CARDIZEM  CD) 120 MG 24 hr capsule Take 1 capsule (120 mg total) by mouth daily. 90 capsule 3   famotidine  (PEPCID ) 20 MG tablet Take 1 tablet (20 mg total) by mouth at bedtime. 30 tablet  0   Fluticasone -Umeclidin-Vilant (TRELEGY ELLIPTA) 100-62.5-25 MCG/ACT AEPB Inhale into the lungs.     HUMALOG KWIKPEN 100 UNIT/ML KwikPen Inject 3 Units into the skin See admin instructions. 3 units in the morning and  units late afternoon/supper time     insulin  glargine (LANTUS  SOLOSTAR) 100 UNIT/ML Solostar Pen 5 Units at bedtime.     ipratropium-albuterol  (DUONEB) 0.5-2.5 (3) MG/3ML SOLN Take 3 mLs by nebulization 2 (two) times daily. PRN     ketoconazole  (NIZORAL ) 2 % cream Apply to bottom of feet twice daily (Patient taking differently: Apply 1 Application topically 2 (two) times daily as needed for irritation (Apply to bottom of feet).) 60 g 2   levothyroxine  (SYNTHROID ) 100 MCG tablet Take 100 mcg by mouth daily before breakfast.     lipase/protease/amylase (CREON) 36000 UNITS CPEP capsule Take 1-2 capsules by mouth See admin instructions. 2 capsule with each meal, 1 capsule with each snack     LORazepam  (ATIVAN ) 0.5 MG tablet Take 0.25-0.5 mg by mouth daily as needed for anxiety.     nitroGLYCERIN  (NITROSTAT ) 0.4 MG SL tablet Place 1 tablet (0.4 mg total) under the tongue every 5 (five) minutes as needed for chest pain. 25 tablet 3   ONETOUCH VERIO test strip SMARTSIG:Via Meter 4 Times Daily PRN     rosuvastatin  (CRESTOR ) 10 MG tablet Take 10 mg by mouth at bedtime.     Vitamin D , Ergocalciferol , (DRISDOL ) 50000 UNITS CAPS Take 50,000 Units by mouth every Monday.      Facility-Administered Medications Prior to Visit  Medication Dose Route Frequency Provider Last Rate Last Admin   0.9 %  sodium chloride  infusion  500 mL Intravenous Continuous Abran Norleen SAILOR, MD         Review of Systems  Constitutional:  Negative for chills, fever, malaise/fatigue and weight loss.  HENT:  Negative for congestion, sinus pain and sore throat.   Eyes: Negative.   Respiratory:  Negative for cough, hemoptysis, sputum production and shortness of breath.   Cardiovascular:  Negative for chest pain, palpitations, orthopnea, claudication and leg swelling.  Gastrointestinal:  Negative for abdominal pain, heartburn, nausea and vomiting.  Genitourinary: Negative.   Musculoskeletal:  Negative for joint pain and myalgias.  Skin:  Negative for rash.  Neurological:  Negative for weakness.  Endo/Heme/Allergies: Negative.   Psychiatric/Behavioral: Negative.      Objective:   There were no vitals filed for this visit.   Physical Exam Constitutional:      General: He is not in acute distress. HENT:     Head: Normocephalic and atraumatic.  Eyes:     Conjunctiva/sclera: Conjunctivae normal.     Pupils: Pupils are equal, round, and reactive to light.  Cardiovascular:     Rate and Rhythm: Normal rate and regular rhythm.     Pulses: Normal pulses.     Heart sounds: Normal heart sounds. No murmur heard. Pulmonary:     Effort: Pulmonary effort is normal.     Breath sounds: No wheezing, rhonchi or rales.  Musculoskeletal:     Right lower leg: No edema.     Left lower leg: No edema.  Lymphadenopathy:     Cervical: No cervical adenopathy.  Skin:    General: Skin is warm and dry.  Neurological:     General: No focal deficit present.     Mental Status: He is alert.  Psychiatric:        Mood and Affect: Mood normal.  Behavior: Behavior normal.        Thought Content: Thought content normal.        Judgment: Judgment normal.     CBC    Component Value Date/Time   WBC 10.2 03/19/2024 1520   RBC 3.67 (L) 03/19/2024 1520   HGB 12.2 (L) 03/19/2024 1520   HGB 14.1 01/17/2011 0844   HCT 35.8 (L) 03/19/2024 1520   HCT 40.6 01/17/2011 0844   PLT 168 03/19/2024 1520    PLT 152 01/17/2011 0844   MCV 97.5 03/19/2024 1520   MCV 95.3 01/17/2011 0844   MCH 33.2 03/19/2024 1520   MCHC 34.1 03/19/2024 1520   RDW 14.0 03/19/2024 1520   RDW 13.6 01/17/2011 0844   LYMPHSABS 0.8 03/19/2024 1520   LYMPHSABS 0.9 01/17/2011 0844   MONOABS 0.7 03/19/2024 1520   MONOABS 0.7 01/17/2011 0844   EOSABS 0.0 03/19/2024 1520   EOSABS 0.1 01/17/2011 0844   BASOSABS 0.0 03/19/2024 1520   BASOSABS 0.0 01/17/2011 0844      Latest Ref Rng & Units 03/19/2024    3:20 PM 10/25/2023   11:29 PM 07/15/2023   11:07 AM  BMP  Glucose 70 - 99 mg/dL 848  529  88   BUN 8 - 23 mg/dL 29  44  39   Creatinine 0.61 - 1.24 mg/dL 8.27  8.05  8.28   Sodium 135 - 145 mmol/L 144  134  139   Potassium 3.5 - 5.1 mmol/L 4.4  4.6  4.5   Chloride 98 - 111 mmol/L 109  101  111   CO2 22 - 32 mmol/L 23  20  22    Calcium  8.9 - 10.3 mg/dL 9.3  8.9  9.1    Chest imaging: CXR 11/12/21 Large lung volumes. Emphysema on CT. Vague, peripheral increased bilateral pulmonary opacity is stable from the the portable chest 2 days ago. No pneumothorax or pleural effusion. Stable cardiac size and mediastinal contours. Visualized tracheal air column is within normal limits. No acute osseous abnormality identified. Small surgical clips at the thoracic inlet from thyroidectomy.  CT Chest 11/10/21 Slight progression of the pulmonary emphysema pattern with bilateral lower lobe peripheral patchy ground-glass mild opacities suggesting mild pneumonitis/viral pneumonia.   Stable calcified and noncalcified pulmonary nodules as detailed above.   Aortic and native coronary atherosclerosis.   Aortic Atherosclerosis (ICD10-I70.0) and Emphysema (ICD10-J43.9).  PFT:    Latest Ref Rng & Units 01/31/2022   11:39 AM  PFT Results  FVC-Pre L 3.49   FVC-Predicted Pre % 87   FVC-Post L 3.50   FVC-Predicted Post % 87   Pre FEV1/FVC % % 71   Post FEV1/FCV % % 73   FEV1-Pre L 2.47   FEV1-Predicted Pre % 88    FEV1-Post L 2.57   DLCO uncorrected ml/min/mmHg 11.30   DLCO UNC% % 45   DLCO corrected ml/min/mmHg 11.30   DLCO COR %Predicted % 45   DLVA Predicted % 53   TLC L 5.26   TLC % Predicted % 70   RV % Predicted % 64    Labs:  Path:  Echo 11/10/21: LV EF 60-65%. RV systolic function and size are normal. LA size is severely dilated. RA moderately dilated.   Heart Catheterization:  Assessment & Plan:   No diagnosis found.  Discussion: David [name] is an 89 year old male, former smoker with DMII, hypertension, CVA, and COPD who returns to pulmonary clinic for pneumonia and emphysema.   Review of patient's chest  imaging from 2019 until most recently during his hospitalization 11/10/2021 he has diffuse emphysematous changes with some subpleural reticulation and fibrosis that appears to have progressed with his recent COVID-19 infection.  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  The progression of these fibrotic areas is likely responsible for his new oxygen requirements and respiratory failure.  He does not require supplemental oxygen during the day or with ambulation. ONO did show he spent 47 minutes with an SpO2 less than 88%. He does not wish to wear oxygen at night so order will be sent to his DME to cancel his O2.   Patient is to continue Advair  250-50 MCG 1 puff twice daily along with as needed Combivent and DuoNeb nebulizer treatments.  He is to use the flutter valve after each nebulizer treatment for airway clearance.  He is to continue famotidine  20mg  at bedtime for reduction in reflux symptoms. He is to continue elevating the head of his bed at night.   He has had improvement in his chest congestion with GERD management.  He is to follow up in 1 year.  Dorn Chill, MD [facility] Office: [phone]   Current Outpatient Medications:    acetaminophen  (TYLENOL ) 500 MG tablet, Take 1-2 tablets (500-1,000 mg  total) by mouth every 8 (eight) hours. (Patient taking differently: Take 500-1,000 mg by mouth every 8 (eight) hours. PRN), Disp: 30 tablet, Rfl: 0   allopurinol  (ZYLOPRIM ) 100 MG tablet, Take 150 mg by mouth daily., Disp: , Rfl:    BD PEN NEEDLE NANO 2ND GEN 32G X 4 MM MISC, USE WITH INSULIN  PENS TO GIVE 4 INJECTIONS DAILY, Disp: , Rfl:    clopidogrel  (PLAVIX ) 75 MG tablet, Take 75 mg by mouth at bedtime. , Disp: , Rfl:    clotrimazole-betamethasone (LOTRISONE) cream, Apply topically as needed (rash)., Disp: , Rfl:    COMBIVENT RESPIMAT 20-100 MCG/ACT AERS respimat, Inhale 1 puff into the lungs in the morning and at bedtime., Disp: , Rfl:    diltiazem  (CARDIZEM  CD) 120 MG 24 hr capsule, Take 1 capsule (120 mg total) by mouth daily., Disp: 90 capsule, Rfl: 3   famotidine  (PEPCID ) 20 MG tablet, Take 1 tablet (20 mg total) by mouth at bedtime., Disp: 30 tablet, Rfl: 0   Fluticasone -Umeclidin-Vilant (TRELEGY ELLIPTA) 100-62.5-25 MCG/ACT AEPB, Inhale into the lungs., Disp: , Rfl:    HUMALOG KWIKPEN 100 UNIT/ML KwikPen, Inject 3 Units into the skin See admin instructions. 3 units in the morning and  units late afternoon/supper time, Disp: , Rfl:    insulin  glargine (LANTUS  SOLOSTAR) 100 UNIT/ML Solostar Pen, 5 Units at bedtime., Disp: , Rfl:    ipratropium-albuterol  (DUONEB) 0.5-2.5 (3) MG/3ML SOLN, Take 3 mLs by nebulization 2 (two) times daily. PRN, Disp: , Rfl:    ketoconazole  (NIZORAL ) 2 % cream, Apply to bottom of feet twice daily (Patient taking differently: Apply 1 Application topically 2 (two) times daily as needed for irritation (Apply to bottom of feet).), Disp: 60 g, Rfl: 2   levothyroxine  (SYNTHROID ) 100 MCG tablet, Take 100 mcg by mouth daily before breakfast., Disp: , Rfl:    lipase/protease/amylase (CREON) 36000 UNITS CPEP capsule, Take 1-2 capsules by mouth See admin instructions. 2 capsule with each meal, 1 capsule with each snack, Disp: , Rfl:    LORazepam  (ATIVAN ) 0.5 MG tablet, Take  0.25-0.5 mg by mouth daily as needed for anxiety., Disp: , Rfl:    nitroGLYCERIN  (NITROSTAT ) 0.4 MG SL tablet,  Place 1 tablet (0.4 mg total) under the tongue every 5 (five) minutes as needed for chest pain., Disp: 25 tablet, Rfl: 3   ONETOUCH VERIO test strip, SMARTSIG:Via Meter 4 Times Daily PRN, Disp: , Rfl:    rosuvastatin  (CRESTOR ) 10 MG tablet, Take 10 mg by mouth at bedtime., Disp: , Rfl:    Vitamin D , Ergocalciferol , (DRISDOL ) 50000 UNITS CAPS, Take 50,000 Units by mouth every Monday. , Disp: , Rfl:   Current Facility-Administered Medications:    0.9 %  sodium chloride  infusion, 500 mL, Intravenous, Continuous, Abran Norleen SAILOR, MD

## 2024-09-30 ENCOUNTER — Encounter: Payer: Self-pay | Admitting: Pulmonary Disease

## 2024-10-20 ENCOUNTER — Encounter: Payer: Self-pay | Admitting: Gastroenterology

## 2024-10-29 ENCOUNTER — Ambulatory Visit: Admitting: Internal Medicine

## 2024-11-26 ENCOUNTER — Encounter: Payer: Self-pay | Admitting: Podiatry

## 2024-11-26 ENCOUNTER — Ambulatory Visit: Admitting: Podiatry

## 2024-11-26 DIAGNOSIS — M79676 Pain in unspecified toe(s): Secondary | ICD-10-CM | POA: Diagnosis not present

## 2024-11-26 DIAGNOSIS — Q828 Other specified congenital malformations of skin: Secondary | ICD-10-CM | POA: Diagnosis not present

## 2024-11-26 DIAGNOSIS — E1142 Type 2 diabetes mellitus with diabetic polyneuropathy: Secondary | ICD-10-CM

## 2024-11-26 DIAGNOSIS — B351 Tinea unguium: Secondary | ICD-10-CM

## 2024-11-26 NOTE — Progress Notes (Signed)
 This patient returns to my office for at risk foot care.  This patient requires this care by a professional since this patient will be at risk due to having chronic kidney disease, thrombocytopenia and type 2 diabetes.   Patient is taking plavix .    This patient is unable to cut nails himself since the patient cannot reach his nails.These nails are painful walking and wearing shoes.  Relates painful callus areas as well.  This patient presents for at risk foot care today.   General Appearance  Alert, conversant and in no acute stress.  Vascular  Dorsalis pedis and posterior tibial  pulses are weakly  palpable  bilaterally.  Capillary return is within normal limits  bilaterally. Temperature is within normal limits  Bilaterally.  Venous stasis  B/L.  Neurologic  Senn-Weinstein monofilament wire test absent   bilaterally. Muscle power within normal limits bilaterally.  Nails Thick disfigured discolored nails with subungual debris  from hallux to fifth toes bilaterally. No evidence of bacterial infection or drainage bilaterally. Hyperkeratotic tissue noted bilateral plantar fifth metatarsals.   Orthopedic  No limitations of motion  feet .  No crepitus or effusions noted.  HAV  B/L.  Hammer toes  B/L.  Exostosis IPJ left hallux.  Skin  normotropic skin with no porokeratosis noted bilaterally.  No signs of infections or ulcers noted.   Porokeratosis sub 5th  Left foot.  Onychomycosis  Pain in right toes  Pain in left toes  Consent was obtained for treatment procedures.   Mechanical debridement of nails 1-5  bilaterally performed with a nail nipper.  Filed with dremel without incident. Hyperkeratotic tissue debrided without incident x2 bilateral. Discussed pevention techniques for hyperkeratotic lesions and how to treat in detail.     Return office visit   12 weeks       Told patient to return for periodic foot care and evaluation due to potential at risk complications.   David [name] DPM

## 2024-12-08 ENCOUNTER — Other Ambulatory Visit (HOSPITAL_COMMUNITY): Payer: Self-pay | Admitting: Gastroenterology

## 2024-12-08 ENCOUNTER — Encounter: Payer: Self-pay | Admitting: Gastroenterology

## 2024-12-08 ENCOUNTER — Telehealth: Payer: Self-pay

## 2024-12-08 ENCOUNTER — Ambulatory Visit: Admitting: Gastroenterology

## 2024-12-08 VITALS — BP 120/72 | HR 62 | Ht 70.5 in | Wt 160.1 lb

## 2024-12-08 DIAGNOSIS — Z8673 Personal history of transient ischemic attack (TIA), and cerebral infarction without residual deficits: Secondary | ICD-10-CM | POA: Diagnosis not present

## 2024-12-08 DIAGNOSIS — I2510 Atherosclerotic heart disease of native coronary artery without angina pectoris: Secondary | ICD-10-CM

## 2024-12-08 DIAGNOSIS — Z7902 Long term (current) use of antithrombotics/antiplatelets: Secondary | ICD-10-CM | POA: Diagnosis not present

## 2024-12-08 DIAGNOSIS — J449 Chronic obstructive pulmonary disease, unspecified: Secondary | ICD-10-CM

## 2024-12-08 DIAGNOSIS — I714 Abdominal aortic aneurysm, without rupture, unspecified: Secondary | ICD-10-CM

## 2024-12-08 DIAGNOSIS — I700 Atherosclerosis of aorta: Secondary | ICD-10-CM | POA: Diagnosis not present

## 2024-12-08 DIAGNOSIS — R059 Cough, unspecified: Secondary | ICD-10-CM

## 2024-12-08 DIAGNOSIS — R194 Change in bowel habit: Secondary | ICD-10-CM

## 2024-12-08 DIAGNOSIS — K219 Gastro-esophageal reflux disease without esophagitis: Secondary | ICD-10-CM

## 2024-12-08 DIAGNOSIS — I5032 Chronic diastolic (congestive) heart failure: Secondary | ICD-10-CM

## 2024-12-08 DIAGNOSIS — R1310 Dysphagia, unspecified: Secondary | ICD-10-CM

## 2024-12-08 DIAGNOSIS — Z87891 Personal history of nicotine dependence: Secondary | ICD-10-CM

## 2024-12-08 DIAGNOSIS — E1122 Type 2 diabetes mellitus with diabetic chronic kidney disease: Secondary | ICD-10-CM

## 2024-12-08 DIAGNOSIS — R1312 Dysphagia, oropharyngeal phase: Secondary | ICD-10-CM

## 2024-12-08 DIAGNOSIS — I6350 Cerebral infarction due to unspecified occlusion or stenosis of unspecified cerebral artery: Secondary | ICD-10-CM

## 2024-12-08 NOTE — Patient Instructions (Addendum)
 A high fiber diet with plenty of fluids (up to 8 glasses of water  daily) is suggested to relieve these symptoms.  benefiber, 1 tablespoon once or twice daily can be used to keep bowels regular if needed.   Start taking Miralax  1 capful (17 grams) 1x / day for 1 week.   If this is not effective, increase to 1 dose 2x / day for 1 week.   If this is still not effective, increase to two capfuls (34 grams) 2x / day.   Can adjust dose as needed based on response. Can take 1/2 cap daily, skip days, or increase per day.    You have been scheduled for a modified barium swallow on Friday, 01-08-25 at 1:00pm. Please arrive 15 minutes prior to your test for registration. You will go to [facility] (1st Floor) for your appointment. Should you need to cancel or reschedule your appointment, please contact  [phone] Geroge Long). _____________________________________________________________________ A Modified Barium Swallow Study, or MBS, is a special x-ray that is taken to check swallowing skills. It is carried out by a Marine Scientist and a Warehouse Manager (SLP). During this test, yourmouth, throat, and esophagus, a muscular tube which connects your mouth to your stomach, is checked. The test will help you, your doctor, and the SLP plan what types of foods and liquids are easier for you to swallow. The SLP will also identify positions and ways to help you swallow more easily and safely. What will happen during an MBS? You will be taken to an x-ray room and seated comfortably. You will be asked to swallow small amounts of food and liquid mixed with barium. Barium is a liquid or paste that allows images of your mouth, throat and esophagus to be seen on x-ray. The x-ray captures moving images of the food you are swallowing as it travels from your mouth through your throat and into your esophagus. This test helps identify whether food or liquid is entering your lungs (aspiration). The test also  shows which part of your mouth or throat lacks strength or coordination to move the food or liquid in the right direction. This test typically takes 30 minutes to 1 hour to complete. _______________________________________________________________________   Patient Visit Summary VISIT SUMMARY:  During today's visit, we discussed your ongoing difficulty swallowing, particularly with solid foods and large pills, as well as your issues with constipation and diarrhea. We reviewed your medical history, including your past stroke and previous endoscopy findings. You were accompanied by your son, Cheryl.  YOUR PLAN:  -OROPHARYNGEAL DYSPHAGIA: Oropharyngeal dysphagia is difficulty swallowing due to problems in the throat. To better understand the cause and risk of food or liquid entering your airway, we have ordered a modified barium swallow test. You are also referred to speech therapy to learn techniques to improve your swallowing.  -CHRONIC CONSTIPATION AND DIARRHEA: Chronic constipation and diarrhea involve alternating bowel habits. We recommend you start taking Benefiber daily in your beverages to help regulate your bowel movements. Additionally, use Miralax  during periods of constipation for at least two weeks, adjusting the dose as needed.  INSTRUCTIONS:  Please schedule the modified barium swallow test and follow up with speech therapy as soon as possible. Continue with the recommended bowel management plan and monitor your symptoms. If you have any concerns or your symptoms worsen, please contact our office.  Thank you for trusting me with your gastrointestinal care!   Nestor Blower, PA   _______________________________________________________  If your blood pressure at your  visit was 140/90 or greater, please contact your primary care physician to follow up on this.  _______________________________________________________  If you are age 65 or older, your body mass index should be  between 23-30. Your Body mass index is 22.65 kg/m. If this is out of the aforementioned range listed, please consider follow up with your Primary Care Provider.  If you are age 54 or younger, your body mass index should be between 19-25. Your Body mass index is 22.65 kg/m. If this is out of the aformentioned range listed, please consider follow up with your Primary Care Provider.   ________________________________________________________  The [facility] providers would like to encourage you to use MYCHART to communicate with providers for non-urgent requests or questions.  Due to long hold times on the telephone, sending your provider a message by [facility] may be a faster and more efficient way to get a response.  Please allow 48 business hours for a response.  Please remember that this is for non-urgent requests.  _______________________________________________________  [facility] is using a team-based approach to care.  Your team is made up of your doctor and two to three APPS. Our APPS (Nurse Practitioners and Physician Assistants) work with your physician to ensure care continuity for you. They are fully qualified to address your health concerns and develop a treatment plan. They communicate directly with your gastroenterologist to care for you. Seeing the Advanced Practice Practitioners on your physician's team can help you by facilitating care more promptly, often allowing for earlier appointments, access to diagnostic testing, procedures, and other specialty referrals.

## 2024-12-08 NOTE — Telephone Encounter (Signed)
 Called and spoke to the son, Cheryl. He is agreeable to scheduling a barium swallow.  Scheduled for [facility] on Thursday, 12-18 at 10am arr at 9:45am. No prep. Cheryl aware of the appointment info

## 2024-12-08 NOTE — Progress Notes (Signed)
 Reviewed. Additionally, I think this patient should have a barium swallow with tablet. Thanks

## 2024-12-08 NOTE — Progress Notes (Signed)
 Chief Complaint: dysphagia Primary GI MD: Dr. Abran  HPI: 89 year old male history of thoracic aortic dilation, COPD, CKD stage III, prior strokes presents for dysphagia   Discussed the use of AI scribe software for clinical note transcription with the patient, who gave verbal consent to proceed.  History of Present Illness  David [name] is a 89 year old male with a history of stroke who presents with difficulty swallowing. He is accompanied by his son, Cheryl.  He has been experiencing difficulty swallowing, particularly with solid foods and large pills, for the past year to year and a half. There is a sensation of food being 'backed up into my throat' and trouble swallowing pills, feeling as if they get stuck in his upper throat area. He also has difficulty drinking from a soda bottle, often getting choked, and notes that liquids sometimes come out of his nose.  He has a history of smoking, having quit in 2006. In 2007, he underwent an endoscopy which revealed inflammation in the stomach and small intestine, diagnosed as gastritis and duodenitis. He is currently taking pantoprazole  once daily and denies experiencing heartburn.  He has a history of a stroke in 2006. He is currently taking Surfak for bowel regulation and has used Metamucil in the past but stopped due to concerns about constipation and urination issues. He uses Miralax  during periods of constipation, typically for four days.    PREVIOUS GI WORKUP    Colonoscopy 07/2017 - Three polyps were found in the transverse colon and ascending colon. The polyps were 1 to 3 mm in size. These polyps were removed with a cold snare. Resection and retrieval were complete. - Internal hemorrhoids were found during retroflexion. - The exam was otherwise without abnormality on direct and retroflexion views.  Past Medical History:  Diagnosis Date   AAA (abdominal aortic aneurysm)    AAA (abdominal aortic aneurysm)    AAA (abdominal  aortic aneurysm)    Anxiety    Atherosclerosis    pt denies this   Bladder cancer (HCC)    BPH (benign prostatic hyperplasia)    CKD (chronic kidney disease), stage III (HCC)    family reports caused by scans but that has resolved since CT scans have stopped   Colon cancer (HCC)    COPD (chronic obstructive pulmonary disease) (HCC)    Diabetes mellitus    ED (erectile dysfunction)    Emphysema lung (HCC)    Exocrine pancreatic insufficiency    Creon   Gastroesophageal reflux disease    Heart murmur    Hyperlipidemia    Hypertension    Lung nodules    Neuromuscular disorder (HCC)    Neuropathy feet   Osteopenia    Peripheral vascular disease    Pneumonia 05/2012   Positional vertigo    intermittent    Shortness of breath    chronic per patient   Stroke (HCC)    x2 in 2006   Stroke (HCC)    Thyroid  cancer (HCC)    Thyroid  disease    Type 2 diabetes mellitus (HCC)    Vitamin D  deficiency     Past Surgical History:  Procedure Laterality Date   ABDOMINAL AORTIC ANEURYSM REPAIR N/A 11/08/2014   Procedure: ANEURYSM ABDOMINAL AORTIC REPAIR;  Surgeon: Krystal JULIANNA Doing, MD;  Location: [facility] OR;  Service: Vascular;  Laterality: N/A;   ABDOMINAL AORTIC ANEURYSM REPAIR     CATARACT EXTRACTION Bilateral    COLON RESECTION  2007   COLON SURGERY  2006   Resection   CYSTOSCOPY W/ RETROGRADES Bilateral 12/30/2020   Procedure: CYSTOSCOPY WITH BILATERAL RETROGRADE;  Surgeon: Watt Rush, MD;  Location: [facility] ORS;  Service: Urology;  Laterality: Bilateral;   CYSTOSCOPY WITH BIOPSY N/A 12/30/2020   Procedure: CYSTOSCOPY WITH BIOPSY AND FULGURATION;  Surgeon: Watt Rush, MD;  Location: [facility];  Service: Urology;  Laterality: N/A;   INGUINAL HERNIA REPAIR Left 07/26/2023   Procedure: OPEN LEFT INGUINAL HERNIA REPAIR WITH MESH;  Surgeon: Tanda Locus, MD;  Location: [facility];  Service: General;  Laterality: Left;   SEPTOPLASTY     THYROIDECTOMY  2007   TRANSURETHRAL RESECTION OF BLADDER TUMOR WITH  MITOMYCIN -C N/A 02/12/2017   Procedure: TRANSURETHRAL RESECTION OF BLADDER TUMOR;  Surgeon: Rush Watt, MD;  Location: [facility];  Service: Urology;  Laterality: N/A;    Current Outpatient Medications  Medication Sig Dispense Refill   acetaminophen  (TYLENOL ) 500 MG tablet Take 1-2 tablets (500-1,000 mg total) by mouth every 8 (eight) hours. 30 tablet 0   allopurinol  (ZYLOPRIM ) 100 MG tablet Take 150 mg by mouth daily.     BD PEN NEEDLE NANO 2ND GEN 32G X 4 MM MISC USE WITH INSULIN  PENS TO GIVE 4 INJECTIONS DAILY     clopidogrel  (PLAVIX ) 75 MG tablet Take 75 mg by mouth at bedtime.      clotrimazole-betamethasone (LOTRISONE) cream Apply topically as needed (rash).     COMBIVENT RESPIMAT 20-100 MCG/ACT AERS respimat Inhale 1 puff into the lungs in the morning and at bedtime.     diltiazem  (CARDIZEM  CD) 120 MG 24 hr capsule Take 1 capsule (120 mg total) by mouth daily. 90 capsule 3   famotidine  (PEPCID ) 20 MG tablet Take 1 tablet (20 mg total) by mouth at bedtime. 30 tablet 0   Fluticasone -Umeclidin-Vilant (TRELEGY ELLIPTA ) 200-62.5-25 MCG/ACT AEPB 2 samples     HUMALOG KWIKPEN 100 UNIT/ML KwikPen Inject 3 Units into the skin See admin instructions. 3 units in the morning and  units late afternoon/supper time     insulin  glargine (LANTUS  SOLOSTAR) 100 UNIT/ML Solostar Pen 5 Units at bedtime.     ipratropium-albuterol  (DUONEB) 0.5-2.5 (3) MG/3ML SOLN Take 3 mLs by nebulization 2 (two) times daily. PRN     ketoconazole  (NIZORAL ) 2 % cream Apply to bottom of feet twice daily (Patient taking differently: Apply 1 Application topically 2 (two) times daily as needed for irritation (Apply to bottom of feet).) 60 g 2   levothyroxine  (SYNTHROID ) 100 MCG tablet Take 100 mcg by mouth daily before breakfast.     lipase/protease/amylase (CREON) 36000 UNITS CPEP capsule Take 1-2 capsules by mouth See admin instructions. 2 capsule with each meal, 1 capsule with each snack     LORazepam  (ATIVAN ) 0.5 MG tablet Take  0.25-0.5 mg by mouth daily as needed for anxiety.     mirtazapine  (REMERON  SOL-TAB) 15 MG disintegrating tablet Take 15 mg by mouth at bedtime.     nitroGLYCERIN  (NITROSTAT ) 0.4 MG SL tablet Place 1 tablet (0.4 mg total) under the tongue every 5 (five) minutes as needed for chest pain. 25 tablet 3   nystatin (MYCOSTATIN) 100000 UNIT/ML suspension Take 5 mLs by mouth as needed.     ONETOUCH VERIO test strip SMARTSIG:Via Meter 4 Times Daily PRN     pantoprazole  (PROTONIX ) 40 MG tablet Take 40 mg by mouth daily.     rosuvastatin  (CRESTOR ) 10 MG tablet Take 10 mg by mouth at bedtime.     Vitamin D , Ergocalciferol , (DRISDOL ) 50000 UNITS  CAPS Take 50,000 Units by mouth every Monday.      Current Facility-Administered Medications  Medication Dose Route Frequency Provider Last Rate Last Admin   0.9 %  sodium chloride  infusion  500 mL Intravenous Continuous Abran Norleen SAILOR, MD        Allergies as of 12/08/2024 - Review Complete 12/08/2024  Allergen Reaction Noted   Azithromycin  Rash 02/01/2017   Doxycycline Rash 06/13/2015    Family History  Problem Relation Age of Onset   Stomach cancer Mother    Heart failure Mother    Breast cancer Mother    Pneumonia Father    Diabetes Brother    Heart disease Brother        Heart Disease before age 48,  AAA and  Carotid   Heart attack Brother    Diabetes Paternal Uncle    Alzheimer's disease Son    Cancer Daughter        mets    Social History   Socioeconomic History   Marital status: Married    Spouse name: Not on file   Number of children: Not on file   Years of education: Not on file   Highest education level: Not on file  Occupational History   Occupation: Retired    Associate Professor: RETIRED  Tobacco Use   Smoking status: Former    Current packs/day: 0.00    Average packs/day: 1.5 packs/day for 50.0 years (75.0 ttl pk-yrs)    Types: Cigarettes    Start date: 05/29/1955    Quit date: 05/28/2005    Years since quitting: 19.5    Passive  exposure: Never   Smokeless tobacco: Never  Vaping Use   Vaping status: Never Used  Substance and Sexual Activity   Alcohol use: No    Alcohol/week: 0.0 standard drinks of alcohol   Drug use: No   Sexual activity: Not Currently  Other Topics Concern   Not on file  Social History Narrative   Not on file   Social Drivers of Health   Tobacco Use: Medium Risk (12/08/2024)   Patient History    Smoking Tobacco Use: Former    Smokeless Tobacco Use: Never    Passive Exposure: Never  Physicist, Medical Strain: Not on file  Food Insecurity: Low Risk (05/01/2024)   Received from [facility]   Epic    Within the past 12 months, you worried that your food would run out before you got money to buy more: Never true    Within the past 12 months, the food you bought just didn't last and you didn't have money to get more. : Never true  Transportation Needs: No Transportation Needs (05/01/2024)   Received from [facility]    In the past 12 months, has lack of reliable transportation kept you from medical appointments, meetings, work or from getting things needed for daily living? : No  Physical Activity: Not on file  Stress: Not on file  Social Connections: Not on file  Intimate Partner Violence: Not on file  Depression (EYV7-0): Not on file  Alcohol Screen: Not on file  Housing: Low Risk (05/01/2024)   Received from [facility]   Epic    What is your living situation today?: I have a steady place to live    Think about the place you live. Do you have problems with any of the following? Choose all that apply:: None/None on this list  Utilities: Low Risk (05/01/2024)   Received from [facility]  Utilities    In the past 12 months has the electric, gas, oil, or water  company threatened to shut off services in your home? : No  Health Literacy: Not on file    Review of Systems:    Constitutional: No weight loss, fever, chills, weakness or fatigue HEENT: Eyes: No change  in vision               Ears, Nose, Throat:  No change in hearing or congestion Skin: No rash or itching Cardiovascular: No chest pain, chest pressure or palpitations   Respiratory: No SOB or cough Gastrointestinal: See HPI and otherwise negative Genitourinary: No dysuria or change in urinary frequency Neurological: No headache, dizziness or syncope Musculoskeletal: No new muscle or joint pain Hematologic: No bleeding or bruising Psychiatric: No history of depression or anxiety    Physical Exam:  Vital signs: BP 120/72 (BP Location: Left Arm, Patient Position: Sitting, Cuff Size: Normal)   Pulse 62   Ht 5' 10.5 (1.791 m)   Wt 160 lb 2 oz (72.6 kg)   BMI 22.65 kg/m   Constitutional: NAD, alert and cooperative Head:  Normocephalic and atraumatic. Eyes:   PEERL, EOMI. No icterus. Conjunctiva pink. Respiratory: Respirations even and unlabored. Lungs clear to auscultation bilaterally.   No wheezes, crackles, or rhonchi.  Cardiovascular:  Regular rate and rhythm. No peripheral edema, cyanosis or pallor.  Gastrointestinal:  Soft, nondistended, nontender. No rebound or guarding. Normal bowel sounds. No appreciable masses or hepatomegaly. Rectal:  Declines Msk:  Symmetrical without gross deformities. Without edema, no deformity or joint abnormality.  Neurologic:  Alert and  oriented x4;  grossly normal neurologically.  Skin:   Dry and intact without significant lesions or rashes. Psychiatric: Oriented to person, place and time. Demonstrates good judgement and reason without abnormal affect or behaviors.  Physical Exam    RELEVANT LABS AND IMAGING: CBC    Component Value Date/Time   WBC 10.2 03/19/2024 1520   RBC 3.67 (L) 03/19/2024 1520   HGB 12.2 (L) 03/19/2024 1520   HGB 14.1 01/17/2011 0844   HCT 35.8 (L) 03/19/2024 1520   HCT 40.6 01/17/2011 0844   PLT 168 03/19/2024 1520   PLT 152 01/17/2011 0844   MCV 97.5 03/19/2024 1520   MCV 95.3 01/17/2011 0844   MCH 33.2  03/19/2024 1520   MCHC 34.1 03/19/2024 1520   RDW 14.0 03/19/2024 1520   RDW 13.6 01/17/2011 0844   LYMPHSABS 0.8 03/19/2024 1520   LYMPHSABS 0.9 01/17/2011 0844   MONOABS 0.7 03/19/2024 1520   MONOABS 0.7 01/17/2011 0844   EOSABS 0.0 03/19/2024 1520   EOSABS 0.1 01/17/2011 0844   BASOSABS 0.0 03/19/2024 1520   BASOSABS 0.0 01/17/2011 0844    CMP     Component Value Date/Time   NA 144 03/19/2024 1520   K 4.4 03/19/2024 1520   CL 109 03/19/2024 1520   CO2 23 03/19/2024 1520   GLUCOSE 151 (H) 03/19/2024 1520   BUN 29 (H) 03/19/2024 1520   CREATININE 1.72 (H) 03/19/2024 1520   CREATININE 0.84 09/15/2014 1400   CALCIUM  9.3 03/19/2024 1520   PROT 6.3 (L) 03/19/2024 1520   ALBUMIN  3.9 03/19/2024 1520   AST 22 03/19/2024 1520   ALT 18 03/19/2024 1520   ALKPHOS 69 03/19/2024 1520   BILITOT 0.8 03/19/2024 1520   GFRNONAA 37 (L) 03/19/2024 1520   GFRAA 45 (L) 07/25/2017 0348     Assessment/Plan:   Oropharyngeal dysphagia CT cervical spine 02/2024 normal.  Oropharyngeal  dysphagia ongoing for 1.5 years with solids, liquids, pills.  No heartburn.  On pantoprazole  40 mg once daily.  Previous tobacco history stopped in 2006.  Concern for aspiration. - Modified barium swallow for further evaluation -- no indication for EGD at this time -- continue pantoprazole  40mg  once daily  Alternating diarrhea and constipation - Benefiber 1 to 2 tablespoons/day - Increase water , increase fiber, increase exercise - During periods of constipation recommend MiraLAX  1 capful daily and adjust as based on response  Prior stroke CAD Aortic atherosclerosis - On Plavix  and statin  COPD Not on oxygen  AAA S/p repair with aneurysm dilation of descending thoracic aorta measured 4.2 cm diameter  Nestor Mollie RIGGERS [facility] 12/08/2024, 2:38 PM  Cc: Shayne Anes, MD

## 2024-12-08 NOTE — Telephone Encounter (Signed)
-----   Message from Nestor CHRISTELLA Blower sent at 12/08/2024  3:30 PM EST ----- Please set up barium swallow with tab as well ----- Message ----- From: Abran Norleen SAILOR, MD Sent: 12/08/2024   3:17 PM EST To: Nestor CHRISTELLA Blower, PA-C

## 2024-12-09 ENCOUNTER — Telehealth: Payer: Self-pay | Admitting: Gastroenterology

## 2024-12-09 NOTE — Telephone Encounter (Signed)
 Inbound call from patient son stating that his father is worried that medicare will not cover his Barium Swallow test. Patient son would like us  to verify this information and is requesting a call back. Please advise.

## 2024-12-09 NOTE — Telephone Encounter (Signed)
 Spoke with pts son and let him know that the hospital would have called and cancelled the xray if his insurance had not approved the test.

## 2024-12-10 ENCOUNTER — Ambulatory Visit (HOSPITAL_COMMUNITY)
Admission: RE | Admit: 2024-12-10 | Discharge: 2024-12-10 | Disposition: A | Source: Ambulatory Visit | Attending: Gastroenterology

## 2024-12-10 DIAGNOSIS — R1310 Dysphagia, unspecified: Secondary | ICD-10-CM | POA: Insufficient documentation

## 2024-12-11 ENCOUNTER — Ambulatory Visit: Payer: Self-pay | Admitting: Gastroenterology

## 2025-01-02 ENCOUNTER — Other Ambulatory Visit: Payer: Self-pay | Admitting: Internal Medicine

## 2025-01-08 ENCOUNTER — Ambulatory Visit (HOSPITAL_COMMUNITY)
Admission: RE | Admit: 2025-01-08 | Discharge: 2025-01-08 | Disposition: A | Discharge status: Home / Self Care | Source: Ambulatory Visit | Attending: Internal Medicine | Admitting: Internal Medicine

## 2025-01-08 DIAGNOSIS — I69919 Unspecified symptoms and signs involving cognitive functions following unspecified cerebrovascular disease: Secondary | ICD-10-CM | POA: Insufficient documentation

## 2025-01-08 DIAGNOSIS — R1310 Dysphagia, unspecified: Secondary | ICD-10-CM

## 2025-01-08 DIAGNOSIS — R1313 Dysphagia, pharyngeal phase: Secondary | ICD-10-CM | POA: Diagnosis not present

## 2025-01-08 DIAGNOSIS — R059 Cough, unspecified: Secondary | ICD-10-CM | POA: Insufficient documentation

## 2025-01-08 DIAGNOSIS — I10 Essential (primary) hypertension: Secondary | ICD-10-CM | POA: Diagnosis not present

## 2025-01-08 NOTE — Progress Notes (Signed)
 Modified Barium Swallow Study  Patient Details  Name: David [name] MRN: [id number] Date of Birth: 06/19/1931  Today's Date: 01/08/2025  Modified Barium Swallow completed.  Full report located under Chart Review in the Imaging Section.  History of Present Illness 89 yo here for an OP MBS after aspirating during previous esophagram 12/10/24. The esophagram did show moderate esophageal dysmotility, likely presbyesophagus but was not optimally evaluated due to aspiration. He initially reported difficulty swallowing, particularly with solid foods and large pills, for the past year and a half. Per GI note, he endorses globus sensation and occasional nasal regurgitation when drinking liquids. PMH includes prior CVA (2006), COPD, GERD,HTN, HLD, pneumonia (2013), thyroid  cancer s/p thyroidectomy (2008)   Clinical Impression Pt exhibits moderate pharyngeal dysphagia primarily characterized by decreased efficiency. Epiglottic inversion and laryngeal elevation are complete but reduced BOT retraction in addition to multiple levels of osteophytes and a prominent cricopharyngeus inhibit pharyngeal clearance. With the majority of thin liquid trials, there is trace penetration that stays above the vocal folds (PAS 3). Once given the 13 mm barium tablet and pt took a large sip in anticipation of having trouble clearing it, initiation and function grew increasingly disorganized. The head of the bolus reached the vocal folds prior to swallow initiation and quickly increased in volume, progressing further with multiple subsequent swallows. Pt immediately coughed forcefully but this did not effectively clear the aspirate (PAS 7). The barium and tablet did, however, clear from his esophagus promptly. Continue current diet but take pills whole with puree. Consider f/u with SLP on an OP basis. Pt remains fairly active and exhibits good oral hygiene but would use increased caution in times of acute illness.   DIGEST  Swallow Severity Rating*  Safety: 2 (single event but gross in volume)  Efficiency: 2  Overall Pharyngeal Swallow Severity: 2 (moderate) 1: mild; 2: moderate; 3: severe; 4: profound  *The Dynamic Imaging Grade of Swallowing Toxicity is standardized for the head and neck cancer population, however, demonstrates promising clinical applications across populations to standardize the clinical rating of pharyngeal swallow safety and severity.  Factors that may increase risk of adverse event in presence of aspiration Noe & Lianne 2021):    Swallow Evaluation Recommendations Recommendations: PO diet PO Diet Recommendation: Regular;Thin liquids (Level 0) Liquid Administration via: Cup;Straw Medication Administration: Whole meds with puree Supervision: Patient able to self-feed Swallowing strategies  : Slow rate;Small bites/sips Postural changes: Position pt fully upright for meals;Stay upright 30-60 min after meals Oral care recommendations: Oral care BID (2x/day);Oral care before PO      Damien Blumenthal, M.A., CCC-SLP Speech Language Pathology, Acute Rehabilitation Services  Secure Chat preferred [phone]  01/08/2025,2:01 PM

## 2025-02-25 ENCOUNTER — Ambulatory Visit: Admitting: Podiatry
